# Patient Record
Sex: Female | Born: 1965 | Race: White | Hispanic: No | Marital: Married | Smoking: Current every day smoker
Health system: Southern US, Community
[De-identification: ages and names within clinical notes are randomized; demographics above are authoritative.]

## PROBLEM LIST (undated history)

## (undated) DIAGNOSIS — I2699 Other pulmonary embolism without acute cor pulmonale: Secondary | ICD-10-CM

## (undated) DIAGNOSIS — I10 Essential (primary) hypertension: Secondary | ICD-10-CM

## (undated) DIAGNOSIS — F329 Major depressive disorder, single episode, unspecified: Secondary | ICD-10-CM

## (undated) DIAGNOSIS — F32A Depression, unspecified: Secondary | ICD-10-CM

## (undated) DIAGNOSIS — F419 Anxiety disorder, unspecified: Secondary | ICD-10-CM

## (undated) DIAGNOSIS — O2230 Deep phlebothrombosis in pregnancy, unspecified trimester: Secondary | ICD-10-CM

## (undated) DIAGNOSIS — D3500 Benign neoplasm of unspecified adrenal gland: Secondary | ICD-10-CM

## (undated) DIAGNOSIS — E119 Type 2 diabetes mellitus without complications: Secondary | ICD-10-CM

## (undated) DIAGNOSIS — K219 Gastro-esophageal reflux disease without esophagitis: Secondary | ICD-10-CM

## (undated) DIAGNOSIS — I639 Cerebral infarction, unspecified: Secondary | ICD-10-CM

## (undated) HISTORY — PX: NO PAST SURGERIES: SHX2092

---

## 1997-06-28 ENCOUNTER — Emergency Department (HOSPITAL_COMMUNITY): Admission: EM | Admit: 1997-06-28 | Discharge: 1997-06-28 | Payer: Self-pay

## 1997-09-22 ENCOUNTER — Emergency Department (HOSPITAL_COMMUNITY): Admission: EM | Admit: 1997-09-22 | Discharge: 1997-09-22 | Payer: Self-pay | Admitting: Emergency Medicine

## 1997-11-07 ENCOUNTER — Emergency Department (HOSPITAL_COMMUNITY): Admission: EM | Admit: 1997-11-07 | Discharge: 1997-11-07 | Payer: Self-pay

## 1998-02-04 ENCOUNTER — Emergency Department (HOSPITAL_COMMUNITY): Admission: EM | Admit: 1998-02-04 | Discharge: 1998-02-04 | Payer: Self-pay | Admitting: Emergency Medicine

## 1998-11-12 ENCOUNTER — Emergency Department (HOSPITAL_COMMUNITY): Admission: EM | Admit: 1998-11-12 | Discharge: 1998-11-12 | Payer: Self-pay | Admitting: *Deleted

## 1998-12-10 ENCOUNTER — Emergency Department (HOSPITAL_COMMUNITY): Admission: EM | Admit: 1998-12-10 | Discharge: 1998-12-10 | Payer: Self-pay | Admitting: Emergency Medicine

## 1999-06-15 ENCOUNTER — Emergency Department (HOSPITAL_COMMUNITY): Admission: EM | Admit: 1999-06-15 | Discharge: 1999-06-15 | Payer: Self-pay | Admitting: Emergency Medicine

## 1999-12-07 ENCOUNTER — Emergency Department (HOSPITAL_COMMUNITY): Admission: EM | Admit: 1999-12-07 | Discharge: 1999-12-07 | Payer: Self-pay | Admitting: Emergency Medicine

## 2004-03-17 ENCOUNTER — Ambulatory Visit (HOSPITAL_COMMUNITY): Admission: RE | Admit: 2004-03-17 | Discharge: 2004-03-17 | Payer: Self-pay | Admitting: Emergency Medicine

## 2004-03-31 ENCOUNTER — Emergency Department (HOSPITAL_COMMUNITY): Admission: EM | Admit: 2004-03-31 | Discharge: 2004-04-01 | Payer: Self-pay | Admitting: Emergency Medicine

## 2004-08-14 ENCOUNTER — Emergency Department (HOSPITAL_COMMUNITY): Admission: EM | Admit: 2004-08-14 | Discharge: 2004-08-14 | Payer: Self-pay | Admitting: Emergency Medicine

## 2005-08-03 ENCOUNTER — Inpatient Hospital Stay (HOSPITAL_COMMUNITY): Admission: EM | Admit: 2005-08-03 | Discharge: 2005-08-04 | Payer: Self-pay | Admitting: Emergency Medicine

## 2005-08-06 ENCOUNTER — Emergency Department (HOSPITAL_COMMUNITY): Admission: EM | Admit: 2005-08-06 | Discharge: 2005-08-06 | Payer: Self-pay | Admitting: Emergency Medicine

## 2005-08-13 ENCOUNTER — Emergency Department (HOSPITAL_COMMUNITY): Admission: EM | Admit: 2005-08-13 | Discharge: 2005-08-13 | Payer: Self-pay | Admitting: Emergency Medicine

## 2005-08-16 ENCOUNTER — Encounter: Admission: RE | Admit: 2005-08-16 | Discharge: 2005-08-16 | Payer: Self-pay | Admitting: Internal Medicine

## 2005-08-30 ENCOUNTER — Emergency Department (HOSPITAL_COMMUNITY): Admission: EM | Admit: 2005-08-30 | Discharge: 2005-08-30 | Payer: Self-pay | Admitting: Emergency Medicine

## 2005-09-03 ENCOUNTER — Emergency Department (HOSPITAL_COMMUNITY): Admission: EM | Admit: 2005-09-03 | Discharge: 2005-09-03 | Payer: Self-pay

## 2005-09-08 ENCOUNTER — Emergency Department (HOSPITAL_COMMUNITY): Admission: EM | Admit: 2005-09-08 | Discharge: 2005-09-08 | Payer: Self-pay | Admitting: Emergency Medicine

## 2005-09-13 ENCOUNTER — Emergency Department (HOSPITAL_COMMUNITY): Admission: EM | Admit: 2005-09-13 | Discharge: 2005-09-13 | Payer: Self-pay | Admitting: Emergency Medicine

## 2005-09-14 ENCOUNTER — Ambulatory Visit (HOSPITAL_COMMUNITY): Admission: RE | Admit: 2005-09-14 | Discharge: 2005-09-14 | Payer: Self-pay | Admitting: Family Medicine

## 2005-09-25 ENCOUNTER — Emergency Department (HOSPITAL_COMMUNITY): Admission: EM | Admit: 2005-09-25 | Discharge: 2005-09-26 | Payer: Self-pay | Admitting: Emergency Medicine

## 2005-10-01 ENCOUNTER — Emergency Department (HOSPITAL_COMMUNITY): Admission: EM | Admit: 2005-10-01 | Discharge: 2005-10-01 | Payer: Self-pay | Admitting: Emergency Medicine

## 2005-10-04 ENCOUNTER — Emergency Department (HOSPITAL_COMMUNITY): Admission: EM | Admit: 2005-10-04 | Discharge: 2005-10-04 | Payer: Self-pay | Admitting: *Deleted

## 2005-10-07 ENCOUNTER — Inpatient Hospital Stay (HOSPITAL_COMMUNITY): Admission: AD | Admit: 2005-10-07 | Discharge: 2005-10-08 | Payer: Self-pay | Admitting: Internal Medicine

## 2005-10-18 ENCOUNTER — Emergency Department (HOSPITAL_COMMUNITY): Admission: EM | Admit: 2005-10-18 | Discharge: 2005-10-18 | Payer: Self-pay | Admitting: Emergency Medicine

## 2005-10-23 ENCOUNTER — Emergency Department (HOSPITAL_COMMUNITY): Admission: EM | Admit: 2005-10-23 | Discharge: 2005-10-23 | Payer: Self-pay | Admitting: Emergency Medicine

## 2005-10-25 ENCOUNTER — Emergency Department (HOSPITAL_COMMUNITY): Admission: EM | Admit: 2005-10-25 | Discharge: 2005-10-25 | Payer: Self-pay | Admitting: Emergency Medicine

## 2005-10-28 ENCOUNTER — Emergency Department (HOSPITAL_COMMUNITY): Admission: EM | Admit: 2005-10-28 | Discharge: 2005-10-28 | Payer: Self-pay | Admitting: Emergency Medicine

## 2005-10-30 ENCOUNTER — Emergency Department (HOSPITAL_COMMUNITY): Admission: EM | Admit: 2005-10-30 | Discharge: 2005-10-31 | Payer: Self-pay | Admitting: Emergency Medicine

## 2005-11-08 ENCOUNTER — Emergency Department (HOSPITAL_COMMUNITY): Admission: EM | Admit: 2005-11-08 | Discharge: 2005-11-08 | Payer: Self-pay | Admitting: Emergency Medicine

## 2005-11-23 ENCOUNTER — Emergency Department (HOSPITAL_COMMUNITY): Admission: EM | Admit: 2005-11-23 | Discharge: 2005-11-23 | Payer: Self-pay | Admitting: Emergency Medicine

## 2005-11-25 ENCOUNTER — Emergency Department (HOSPITAL_COMMUNITY): Admission: EM | Admit: 2005-11-25 | Discharge: 2005-11-25 | Payer: Self-pay | Admitting: Emergency Medicine

## 2005-12-06 ENCOUNTER — Emergency Department (HOSPITAL_COMMUNITY): Admission: EM | Admit: 2005-12-06 | Discharge: 2005-12-06 | Payer: Self-pay | Admitting: Emergency Medicine

## 2005-12-11 ENCOUNTER — Emergency Department (HOSPITAL_COMMUNITY): Admission: EM | Admit: 2005-12-11 | Discharge: 2005-12-11 | Payer: Self-pay | Admitting: Emergency Medicine

## 2006-01-17 ENCOUNTER — Emergency Department (HOSPITAL_COMMUNITY): Admission: EM | Admit: 2006-01-17 | Discharge: 2006-01-17 | Payer: Self-pay | Admitting: Emergency Medicine

## 2006-01-31 ENCOUNTER — Emergency Department (HOSPITAL_COMMUNITY): Admission: EM | Admit: 2006-01-31 | Discharge: 2006-01-31 | Payer: Self-pay | Admitting: Emergency Medicine

## 2006-02-16 ENCOUNTER — Emergency Department (HOSPITAL_COMMUNITY): Admission: EM | Admit: 2006-02-16 | Discharge: 2006-02-16 | Payer: Self-pay | Admitting: Emergency Medicine

## 2006-02-20 ENCOUNTER — Emergency Department (HOSPITAL_COMMUNITY): Admission: EM | Admit: 2006-02-20 | Discharge: 2006-02-20 | Payer: Self-pay | Admitting: Emergency Medicine

## 2006-02-26 ENCOUNTER — Ambulatory Visit: Payer: Self-pay | Admitting: Gastroenterology

## 2006-02-26 ENCOUNTER — Emergency Department (HOSPITAL_COMMUNITY): Admission: EM | Admit: 2006-02-26 | Discharge: 2006-02-26 | Payer: Self-pay | Admitting: Emergency Medicine

## 2006-03-08 ENCOUNTER — Emergency Department (HOSPITAL_COMMUNITY): Admission: EM | Admit: 2006-03-08 | Discharge: 2006-03-09 | Payer: Self-pay | Admitting: Emergency Medicine

## 2006-03-18 ENCOUNTER — Emergency Department (HOSPITAL_COMMUNITY): Admission: EM | Admit: 2006-03-18 | Discharge: 2006-03-18 | Payer: Self-pay | Admitting: Emergency Medicine

## 2006-03-30 ENCOUNTER — Emergency Department (HOSPITAL_COMMUNITY): Admission: EM | Admit: 2006-03-30 | Discharge: 2006-03-30 | Payer: Self-pay | Admitting: Emergency Medicine

## 2006-04-01 ENCOUNTER — Ambulatory Visit: Payer: Self-pay | Admitting: Internal Medicine

## 2006-04-06 ENCOUNTER — Emergency Department (HOSPITAL_COMMUNITY): Admission: EM | Admit: 2006-04-06 | Discharge: 2006-04-06 | Payer: Self-pay | Admitting: Emergency Medicine

## 2006-04-09 ENCOUNTER — Ambulatory Visit: Payer: Self-pay | Admitting: Gastroenterology

## 2006-04-09 ENCOUNTER — Ambulatory Visit: Payer: Self-pay | Admitting: Internal Medicine

## 2006-04-09 LAB — CONVERTED CEMR LAB
Basophils Relative: 0.8 % (ref 0.0–1.0)
HCT: 41.5 % (ref 36.0–46.0)
HDL: 34 mg/dL — ABNORMAL LOW (ref >39.0)
Hemoglobin: 14.6 g/dL (ref 12.0–15.0)
Leukocytes, UA: NEGATIVE
Monocytes Absolute: 0.6 10*3/uL (ref 0.2–0.7)
Neutrophils Relative %: 68.3 % (ref 43.0–77.0)
RDW: 12.9 % (ref 11.5–14.6)
Specific Gravity, Urine: >=1.03 (ref 1.000–1.03)
Urobilinogen, UA: 0.2 (ref 0.0–1.0)
VLDL: 30 mg/dL (ref 0–40)
pH: 5.5 (ref 5.0–8.0)

## 2006-04-10 ENCOUNTER — Emergency Department (HOSPITAL_COMMUNITY): Admission: EM | Admit: 2006-04-10 | Discharge: 2006-04-10 | Payer: Self-pay | Admitting: Emergency Medicine

## 2006-04-18 ENCOUNTER — Emergency Department (HOSPITAL_COMMUNITY): Admission: EM | Admit: 2006-04-18 | Discharge: 2006-04-18 | Payer: Self-pay | Admitting: Emergency Medicine

## 2006-04-19 ENCOUNTER — Ambulatory Visit: Payer: Self-pay | Admitting: Internal Medicine

## 2006-04-24 ENCOUNTER — Ambulatory Visit: Payer: Self-pay | Admitting: Gastroenterology

## 2006-04-24 ENCOUNTER — Encounter (INDEPENDENT_AMBULATORY_CARE_PROVIDER_SITE_OTHER): Payer: Self-pay | Admitting: Specialist

## 2006-04-26 ENCOUNTER — Emergency Department (HOSPITAL_COMMUNITY): Admission: EM | Admit: 2006-04-26 | Discharge: 2006-04-26 | Payer: Self-pay | Admitting: Emergency Medicine

## 2006-05-02 ENCOUNTER — Emergency Department (HOSPITAL_COMMUNITY): Admission: EM | Admit: 2006-05-02 | Discharge: 2006-05-02 | Payer: Self-pay | Admitting: Emergency Medicine

## 2006-05-11 ENCOUNTER — Emergency Department (HOSPITAL_COMMUNITY): Admission: EM | Admit: 2006-05-11 | Discharge: 2006-05-11 | Payer: Self-pay | Admitting: Emergency Medicine

## 2006-05-11 ENCOUNTER — Ambulatory Visit: Payer: Self-pay | Admitting: Family Medicine

## 2006-05-14 ENCOUNTER — Emergency Department (HOSPITAL_COMMUNITY): Admission: EM | Admit: 2006-05-14 | Discharge: 2006-05-14 | Payer: Self-pay | Admitting: Emergency Medicine

## 2006-05-16 ENCOUNTER — Emergency Department (HOSPITAL_COMMUNITY): Admission: EM | Admit: 2006-05-16 | Discharge: 2006-05-16 | Payer: Self-pay | Admitting: Emergency Medicine

## 2006-05-22 ENCOUNTER — Emergency Department (HOSPITAL_COMMUNITY): Admission: EM | Admit: 2006-05-22 | Discharge: 2006-05-22 | Payer: Self-pay | Admitting: Emergency Medicine

## 2006-05-29 ENCOUNTER — Ambulatory Visit: Payer: Self-pay | Admitting: Internal Medicine

## 2006-05-29 ENCOUNTER — Ambulatory Visit: Payer: Self-pay | Admitting: Cardiology

## 2006-05-29 LAB — CONVERTED CEMR LAB
Alkaline Phosphatase: 79 units/L (ref 39–117)
Amylase: 108 units/L (ref 27–131)
BUN: 8 mg/dL (ref 6–23)
CO2: 25 meq/L (ref 19–32)
GFR calc Af Amer: 63 mL/min
Ketones, ur: NEGATIVE mg/dL
Potassium: 3.7 meq/L (ref 3.5–5.1)
RBC / HPF: NONE SEEN
Sed Rate: 9 mm/hr (ref 0–25)
Sodium: 140 meq/L (ref 135–145)
Specific Gravity, Urine: 1.02 (ref 1.000–1.03)
Total CHOL/HDL Ratio: 8.6
Total Protein: 7.2 g/dL (ref 6.0–8.3)
Triglycerides: 244 mg/dL (ref 0–149)
Urine Glucose: NEGATIVE mg/dL
Urobilinogen, UA: 0.2 (ref 0.0–1.0)
hCG, Beta Chain, Quant, S: <0.5 milliintl units/mL

## 2006-06-02 ENCOUNTER — Emergency Department (HOSPITAL_COMMUNITY): Admission: EM | Admit: 2006-06-02 | Discharge: 2006-06-03 | Payer: Self-pay | Admitting: Emergency Medicine

## 2006-06-10 ENCOUNTER — Emergency Department (HOSPITAL_COMMUNITY): Admission: EM | Admit: 2006-06-10 | Discharge: 2006-06-10 | Payer: Self-pay | Admitting: Emergency Medicine

## 2006-06-20 ENCOUNTER — Emergency Department (HOSPITAL_COMMUNITY): Admission: EM | Admit: 2006-06-20 | Discharge: 2006-06-20 | Payer: Self-pay | Admitting: Emergency Medicine

## 2006-07-01 ENCOUNTER — Emergency Department (HOSPITAL_COMMUNITY): Admission: EM | Admit: 2006-07-01 | Discharge: 2006-07-01 | Payer: Self-pay | Admitting: Emergency Medicine

## 2006-07-11 ENCOUNTER — Emergency Department (HOSPITAL_COMMUNITY): Admission: EM | Admit: 2006-07-11 | Discharge: 2006-07-11 | Payer: Self-pay | Admitting: Emergency Medicine

## 2006-07-20 ENCOUNTER — Emergency Department (HOSPITAL_COMMUNITY): Admission: EM | Admit: 2006-07-20 | Discharge: 2006-07-20 | Payer: Self-pay | Admitting: Emergency Medicine

## 2006-08-15 ENCOUNTER — Emergency Department (HOSPITAL_COMMUNITY): Admission: EM | Admit: 2006-08-15 | Discharge: 2006-08-15 | Payer: Self-pay | Admitting: *Deleted

## 2006-08-18 ENCOUNTER — Emergency Department (HOSPITAL_COMMUNITY): Admission: EM | Admit: 2006-08-18 | Discharge: 2006-08-18 | Payer: Self-pay | Admitting: Emergency Medicine

## 2006-09-29 ENCOUNTER — Emergency Department (HOSPITAL_COMMUNITY): Admission: EM | Admit: 2006-09-29 | Discharge: 2006-09-29 | Payer: Self-pay | Admitting: Emergency Medicine

## 2006-10-24 ENCOUNTER — Ambulatory Visit: Payer: Self-pay | Admitting: Family Medicine

## 2006-10-24 ENCOUNTER — Observation Stay (HOSPITAL_COMMUNITY): Admission: EM | Admit: 2006-10-24 | Discharge: 2006-10-25 | Payer: Self-pay | Admitting: Family Medicine

## 2006-10-27 ENCOUNTER — Ambulatory Visit (HOSPITAL_COMMUNITY): Admission: RE | Admit: 2006-10-27 | Discharge: 2006-10-27 | Payer: Self-pay | Admitting: Family Medicine

## 2006-12-03 ENCOUNTER — Emergency Department (HOSPITAL_COMMUNITY): Admission: EM | Admit: 2006-12-03 | Discharge: 2006-12-03 | Payer: Self-pay | Admitting: Emergency Medicine

## 2007-01-03 ENCOUNTER — Emergency Department (HOSPITAL_COMMUNITY): Admission: EM | Admit: 2007-01-03 | Discharge: 2007-01-03 | Payer: Self-pay | Admitting: Emergency Medicine

## 2007-02-01 ENCOUNTER — Emergency Department (HOSPITAL_COMMUNITY): Admission: EM | Admit: 2007-02-01 | Discharge: 2007-02-01 | Payer: Self-pay | Admitting: Emergency Medicine

## 2007-02-14 ENCOUNTER — Emergency Department (HOSPITAL_COMMUNITY): Admission: EM | Admit: 2007-02-14 | Discharge: 2007-02-15 | Payer: Self-pay | Admitting: Emergency Medicine

## 2007-03-06 ENCOUNTER — Emergency Department (HOSPITAL_COMMUNITY): Admission: EM | Admit: 2007-03-06 | Discharge: 2007-03-06 | Payer: Self-pay | Admitting: Emergency Medicine

## 2007-03-14 ENCOUNTER — Emergency Department (HOSPITAL_COMMUNITY): Admission: EM | Admit: 2007-03-14 | Discharge: 2007-03-14 | Payer: Self-pay | Admitting: Emergency Medicine

## 2007-03-17 ENCOUNTER — Ambulatory Visit: Payer: Self-pay | Admitting: Gastroenterology

## 2007-04-08 ENCOUNTER — Emergency Department (HOSPITAL_COMMUNITY): Admission: EM | Admit: 2007-04-08 | Discharge: 2007-04-08 | Payer: Self-pay | Admitting: Emergency Medicine

## 2007-05-12 ENCOUNTER — Emergency Department (HOSPITAL_COMMUNITY): Admission: EM | Admit: 2007-05-12 | Discharge: 2007-05-13 | Payer: Self-pay | Admitting: Emergency Medicine

## 2007-07-08 ENCOUNTER — Emergency Department (HOSPITAL_COMMUNITY): Admission: EM | Admit: 2007-07-08 | Discharge: 2007-07-08 | Payer: Self-pay | Admitting: Emergency Medicine

## 2007-09-17 ENCOUNTER — Emergency Department (HOSPITAL_COMMUNITY): Admission: EM | Admit: 2007-09-17 | Discharge: 2007-09-17 | Payer: Self-pay | Admitting: Emergency Medicine

## 2007-10-27 ENCOUNTER — Emergency Department (HOSPITAL_COMMUNITY): Admission: EM | Admit: 2007-10-27 | Discharge: 2007-10-27 | Payer: Self-pay | Admitting: Emergency Medicine

## 2007-12-11 ENCOUNTER — Emergency Department (HOSPITAL_COMMUNITY): Admission: EM | Admit: 2007-12-11 | Discharge: 2007-12-11 | Payer: Self-pay | Admitting: Emergency Medicine

## 2008-02-11 ENCOUNTER — Emergency Department (HOSPITAL_COMMUNITY): Admission: EM | Admit: 2008-02-11 | Discharge: 2008-02-11 | Payer: Self-pay | Admitting: Emergency Medicine

## 2008-02-23 ENCOUNTER — Inpatient Hospital Stay (HOSPITAL_COMMUNITY): Admission: EM | Admit: 2008-02-23 | Discharge: 2008-02-27 | Payer: Self-pay | Admitting: Emergency Medicine

## 2008-02-24 ENCOUNTER — Ambulatory Visit: Payer: Self-pay | Admitting: Vascular Surgery

## 2008-02-24 ENCOUNTER — Encounter (INDEPENDENT_AMBULATORY_CARE_PROVIDER_SITE_OTHER): Payer: Self-pay | Admitting: Internal Medicine

## 2008-03-08 ENCOUNTER — Emergency Department (HOSPITAL_COMMUNITY): Admission: EM | Admit: 2008-03-08 | Discharge: 2008-03-08 | Payer: Self-pay | Admitting: Emergency Medicine

## 2008-05-07 ENCOUNTER — Encounter: Admission: RE | Admit: 2008-05-07 | Discharge: 2008-05-07 | Payer: Self-pay | Admitting: Internal Medicine

## 2008-05-17 ENCOUNTER — Emergency Department (HOSPITAL_COMMUNITY): Admission: EM | Admit: 2008-05-17 | Discharge: 2008-05-18 | Payer: Self-pay | Admitting: Emergency Medicine

## 2008-06-15 ENCOUNTER — Emergency Department (HOSPITAL_COMMUNITY): Admission: EM | Admit: 2008-06-15 | Discharge: 2008-06-15 | Payer: Self-pay | Admitting: Emergency Medicine

## 2008-07-30 ENCOUNTER — Encounter: Admission: RE | Admit: 2008-07-30 | Discharge: 2008-07-30 | Payer: Self-pay | Admitting: Internal Medicine

## 2008-08-19 ENCOUNTER — Emergency Department (HOSPITAL_COMMUNITY): Admission: EM | Admit: 2008-08-19 | Discharge: 2008-08-20 | Payer: Self-pay | Admitting: Emergency Medicine

## 2008-09-17 ENCOUNTER — Inpatient Hospital Stay (HOSPITAL_COMMUNITY): Admission: EM | Admit: 2008-09-17 | Discharge: 2008-09-18 | Payer: Self-pay | Admitting: Emergency Medicine

## 2009-02-26 ENCOUNTER — Emergency Department (HOSPITAL_COMMUNITY): Admission: EM | Admit: 2009-02-26 | Discharge: 2009-02-26 | Payer: Self-pay | Admitting: Emergency Medicine

## 2009-06-06 ENCOUNTER — Emergency Department (HOSPITAL_COMMUNITY): Admission: EM | Admit: 2009-06-06 | Discharge: 2009-06-06 | Payer: Self-pay | Admitting: Emergency Medicine

## 2009-08-06 ENCOUNTER — Emergency Department (HOSPITAL_COMMUNITY): Admission: EM | Admit: 2009-08-06 | Discharge: 2009-08-06 | Payer: Self-pay | Admitting: Emergency Medicine

## 2010-01-29 ENCOUNTER — Encounter: Payer: Self-pay | Admitting: Gastroenterology

## 2010-03-25 LAB — URINALYSIS, ROUTINE W REFLEX MICROSCOPIC
Bilirubin Urine: NEGATIVE
Ketones, ur: NEGATIVE mg/dL
Nitrite: NEGATIVE
Protein, ur: NEGATIVE mg/dL
Specific Gravity, Urine: 1.029 (ref 1.005–1.030)
Urobilinogen, UA: 0.2 mg/dL (ref 0.0–1.0)

## 2010-03-25 LAB — DIFFERENTIAL
Eosinophils Relative: 2 % (ref 0–5)
Lymphocytes Relative: 37 % (ref 12–46)
Lymphs Abs: 3.9 10*3/uL (ref 0.7–4.0)

## 2010-03-25 LAB — COMPREHENSIVE METABOLIC PANEL
AST: 17 U/L (ref 0–37)
CO2: 25 mEq/L (ref 19–32)
Calcium: 9.1 mg/dL (ref 8.4–10.5)
Creatinine, Ser: 1.19 mg/dL (ref 0.4–1.2)
GFR calc Af Amer: 59 mL/min — ABNORMAL LOW (ref >60)
GFR calc non Af Amer: 49 mL/min — ABNORMAL LOW (ref >60)

## 2010-03-25 LAB — URINE CULTURE: Colony Count: 2000

## 2010-03-25 LAB — CBC
Hemoglobin: 16.3 g/dL — ABNORMAL HIGH (ref 12.0–15.0)
MCH: 33.2 pg (ref 26.0–34.0)
MCHC: 34.8 g/dL (ref 30.0–36.0)
Platelets: 229 10*3/uL (ref 150–400)

## 2010-03-25 LAB — LIPASE, BLOOD: Lipase: 27 U/L (ref 11–59)

## 2010-03-27 LAB — CBC
HCT: 46.5 % — ABNORMAL HIGH (ref 36.0–46.0)
Hemoglobin: 15.9 g/dL — ABNORMAL HIGH (ref 12.0–15.0)
RBC: 4.82 MIL/uL (ref 3.87–5.11)
RDW: 13.8 % (ref 11.5–15.5)
WBC: 19 10*3/uL — ABNORMAL HIGH (ref 4.0–10.5)

## 2010-03-27 LAB — DIFFERENTIAL
Eosinophils Relative: 1 % (ref 0–5)
Lymphocytes Relative: 12 % (ref 12–46)
Lymphs Abs: 2.2 10*3/uL (ref 0.7–4.0)
Monocytes Absolute: 0.9 10*3/uL (ref 0.1–1.0)
Monocytes Relative: 5 % (ref 3–12)

## 2010-03-27 LAB — POCT I-STAT, CHEM 8
BUN: 12 mg/dL (ref 6–23)
Calcium, Ion: 1.04 mmol/L — ABNORMAL LOW (ref 1.12–1.32)
Creatinine, Ser: 1.1 mg/dL (ref 0.4–1.2)
Glucose, Bld: 136 mg/dL — ABNORMAL HIGH (ref 70–99)
TCO2: 19 mmol/L (ref 0–100)

## 2010-03-27 LAB — D-DIMER, QUANTITATIVE: D-Dimer, Quant: 1.53 ug/mL-FEU — ABNORMAL HIGH (ref 0.00–0.48)

## 2010-03-27 LAB — POCT CARDIAC MARKERS: Troponin i, poc: <0.05 ng/mL (ref 0.00–0.09)

## 2010-03-29 LAB — BASIC METABOLIC PANEL
BUN: 12 mg/dL (ref 6–23)
Chloride: 108 mEq/L (ref 96–112)
Creatinine, Ser: 1.05 mg/dL (ref 0.4–1.2)
Glucose, Bld: 86 mg/dL (ref 70–99)

## 2010-03-29 LAB — URINE MICROSCOPIC-ADD ON

## 2010-03-29 LAB — DIFFERENTIAL
Basophils Absolute: 0 10*3/uL (ref 0.0–0.1)
Basophils Relative: 0 % (ref 0–1)
Eosinophils Absolute: 0.2 10*3/uL (ref 0.0–0.7)
Eosinophils Relative: 1 % (ref 0–5)
Lymphs Abs: 3.4 10*3/uL (ref 0.7–4.0)
Neutrophils Relative %: 67 % (ref 43–77)

## 2010-03-29 LAB — CBC
HCT: 43.5 % (ref 36.0–46.0)
MCV: 96.9 fL (ref 78.0–100.0)
Platelets: 223 10*3/uL (ref 150–400)
RDW: 15.4 % (ref 11.5–15.5)
WBC: 13.5 10*3/uL — ABNORMAL HIGH (ref 4.0–10.5)

## 2010-03-29 LAB — URINALYSIS, ROUTINE W REFLEX MICROSCOPIC
Nitrite: NEGATIVE
Specific Gravity, Urine: 1.024 (ref 1.005–1.030)
Urobilinogen, UA: 0.2 mg/dL (ref 0.0–1.0)
pH: 5.5 (ref 5.0–8.0)

## 2010-04-11 ENCOUNTER — Emergency Department (HOSPITAL_COMMUNITY): Payer: Self-pay

## 2010-04-11 ENCOUNTER — Encounter (HOSPITAL_COMMUNITY): Payer: Self-pay | Admitting: Radiology

## 2010-04-11 ENCOUNTER — Emergency Department (HOSPITAL_COMMUNITY)
Admission: EM | Admit: 2010-04-11 | Discharge: 2010-04-11 | Disposition: A | Discharge status: Home / Self Care | Payer: Self-pay | Attending: Emergency Medicine | Admitting: Emergency Medicine

## 2010-04-11 DIAGNOSIS — Z79899 Other long term (current) drug therapy: Secondary | ICD-10-CM | POA: Insufficient documentation

## 2010-04-11 DIAGNOSIS — R109 Unspecified abdominal pain: Secondary | ICD-10-CM | POA: Insufficient documentation

## 2010-04-11 DIAGNOSIS — R000 Tachycardia, unspecified: Secondary | ICD-10-CM | POA: Insufficient documentation

## 2010-04-11 DIAGNOSIS — K219 Gastro-esophageal reflux disease without esophagitis: Secondary | ICD-10-CM | POA: Insufficient documentation

## 2010-04-11 DIAGNOSIS — R059 Cough, unspecified: Secondary | ICD-10-CM | POA: Insufficient documentation

## 2010-04-11 DIAGNOSIS — Z86718 Personal history of other venous thrombosis and embolism: Secondary | ICD-10-CM | POA: Insufficient documentation

## 2010-04-11 DIAGNOSIS — R110 Nausea: Secondary | ICD-10-CM | POA: Insufficient documentation

## 2010-04-11 DIAGNOSIS — N390 Urinary tract infection, site not specified: Secondary | ICD-10-CM | POA: Insufficient documentation

## 2010-04-11 DIAGNOSIS — R197 Diarrhea, unspecified: Secondary | ICD-10-CM | POA: Insufficient documentation

## 2010-04-11 DIAGNOSIS — F411 Generalized anxiety disorder: Secondary | ICD-10-CM | POA: Insufficient documentation

## 2010-04-11 DIAGNOSIS — R05 Cough: Secondary | ICD-10-CM | POA: Insufficient documentation

## 2010-04-11 HISTORY — DX: Other pulmonary embolism without acute cor pulmonale: I26.99

## 2010-04-11 LAB — COMPREHENSIVE METABOLIC PANEL
AST: 16 U/L (ref 0–37)
Albumin: 3.6 g/dL (ref 3.5–5.2)
Alkaline Phosphatase: 60 U/L (ref 39–117)
BUN: 11 mg/dL (ref 6–23)
Chloride: 108 mEq/L (ref 96–112)
GFR calc Af Amer: >60 mL/min (ref >60)
Potassium: 3.7 mEq/L (ref 3.5–5.1)
Sodium: 137 mEq/L (ref 135–145)
Total Bilirubin: 0.3 mg/dL (ref 0.3–1.2)
Total Protein: 6.7 g/dL (ref 6.0–8.3)

## 2010-04-11 LAB — CBC
Hemoglobin: 16 g/dL — ABNORMAL HIGH (ref 12.0–15.0)
MCH: 33.7 pg (ref 26.0–34.0)
Platelets: 255 10*3/uL (ref 150–400)
RBC: 4.75 MIL/uL (ref 3.87–5.11)
WBC: 15.6 10*3/uL — ABNORMAL HIGH (ref 4.0–10.5)

## 2010-04-11 LAB — URINE MICROSCOPIC-ADD ON

## 2010-04-11 LAB — URINALYSIS, ROUTINE W REFLEX MICROSCOPIC
Glucose, UA: 100 mg/dL — AB
Ketones, ur: NEGATIVE mg/dL
Protein, ur: NEGATIVE mg/dL
pH: 5 (ref 5.0–8.0)

## 2010-04-11 LAB — DIFFERENTIAL
Basophils Absolute: 0 10*3/uL (ref 0.0–0.1)
Basophils Relative: 0 % (ref 0–1)
Eosinophils Absolute: 0.1 10*3/uL (ref 0.0–0.7)
Monocytes Relative: 5 % (ref 3–12)
Neutro Abs: 11.7 10*3/uL — ABNORMAL HIGH (ref 1.7–7.7)
Neutrophils Relative %: 75 % (ref 43–77)

## 2010-04-11 LAB — POCT PREGNANCY, URINE: Preg Test, Ur: NEGATIVE

## 2010-04-11 MED ORDER — IOHEXOL 350 MG/ML SOLN
100.0000 mL | Freq: Once | INTRAVENOUS | Status: AC | PRN
  Administered 2010-04-11: 100 mL via INTRAVENOUS

## 2010-04-12 LAB — URINE CULTURE

## 2010-04-14 LAB — POCT I-STAT, CHEM 8
Creatinine, Ser: 0.9 mg/dL (ref 0.4–1.2)
Glucose, Bld: 107 mg/dL — ABNORMAL HIGH (ref 70–99)
Hemoglobin: 18 g/dL — ABNORMAL HIGH (ref 12.0–15.0)
Sodium: 137 mEq/L (ref 135–145)
TCO2: 25 mmol/L (ref 0–100)

## 2010-04-14 LAB — BASIC METABOLIC PANEL
CO2: 24 mEq/L (ref 19–32)
Calcium: 8.5 mg/dL (ref 8.4–10.5)
Chloride: 108 mEq/L (ref 96–112)
GFR calc Af Amer: >60 mL/min (ref >60)
Sodium: 138 mEq/L (ref 135–145)

## 2010-04-14 LAB — CBC
Hemoglobin: 14.4 g/dL (ref 12.0–15.0)
MCHC: 33.7 g/dL (ref 30.0–36.0)
MCHC: 33.7 g/dL (ref 30.0–36.0)
MCV: 95.9 fL (ref 78.0–100.0)
RBC: 4.45 MIL/uL (ref 3.87–5.11)
RBC: 5.11 MIL/uL (ref 3.87–5.11)

## 2010-04-14 LAB — POCT CARDIAC MARKERS
CKMB, poc: <1 ng/mL — ABNORMAL LOW (ref 1.0–8.0)
Myoglobin, poc: 64.3 ng/mL (ref 12–200)

## 2010-04-14 LAB — DIFFERENTIAL
Basophils Absolute: 0 10*3/uL (ref 0.0–0.1)
Basophils Relative: 0 % (ref 0–1)
Monocytes Relative: 4 % (ref 3–12)
Neutro Abs: 13.6 10*3/uL — ABNORMAL HIGH (ref 1.7–7.7)
Neutrophils Relative %: 82 % — ABNORMAL HIGH (ref 43–77)

## 2010-04-14 LAB — CARDIAC PANEL(CRET KIN+CKTOT+MB+TROPI): Troponin I: 0.02 ng/mL (ref 0.00–0.06)

## 2010-04-14 LAB — URINALYSIS, ROUTINE W REFLEX MICROSCOPIC
Bilirubin Urine: NEGATIVE
Glucose, UA: NEGATIVE mg/dL
Hgb urine dipstick: NEGATIVE
Ketones, ur: NEGATIVE mg/dL
Protein, ur: NEGATIVE mg/dL

## 2010-04-14 LAB — RAPID URINE DRUG SCREEN, HOSP PERFORMED
Amphetamines: NOT DETECTED
Barbiturates: NOT DETECTED
Benzodiazepines: NOT DETECTED
Tetrahydrocannabinol: NOT DETECTED

## 2010-04-14 LAB — LIPID PANEL
HDL: 31 mg/dL — ABNORMAL LOW (ref >39)
LDL Cholesterol: 119 mg/dL — ABNORMAL HIGH (ref 0–99)
Triglycerides: 149 mg/dL (ref <150)

## 2010-04-14 LAB — PROTIME-INR
INR: 2.1 — ABNORMAL HIGH (ref 0.00–1.49)
INR: 2.1 — ABNORMAL HIGH (ref 0.00–1.49)
Prothrombin Time: 23.5 seconds — ABNORMAL HIGH (ref 11.6–15.2)

## 2010-04-14 LAB — CK TOTAL AND CKMB (NOT AT ARMC)
Relative Index: INVALID (ref 0.0–2.5)
Total CK: 60 U/L (ref 7–177)

## 2010-04-15 LAB — COMPREHENSIVE METABOLIC PANEL
ALT: 21 U/L (ref 0–35)
Albumin: 3.5 g/dL (ref 3.5–5.2)
Alkaline Phosphatase: 54 U/L (ref 39–117)
Calcium: 9.2 mg/dL (ref 8.4–10.5)
GFR calc Af Amer: >60 mL/min (ref >60)
Potassium: 4.1 mEq/L (ref 3.5–5.1)
Sodium: 139 mEq/L (ref 135–145)
Total Protein: 6.4 g/dL (ref 6.0–8.3)

## 2010-04-15 LAB — URINE MICROSCOPIC-ADD ON

## 2010-04-15 LAB — URINALYSIS, ROUTINE W REFLEX MICROSCOPIC
Glucose, UA: NEGATIVE mg/dL
Leukocytes, UA: NEGATIVE
Specific Gravity, Urine: 1.034 — ABNORMAL HIGH (ref 1.005–1.030)
pH: 5.5 (ref 5.0–8.0)

## 2010-04-15 LAB — CBC
Hemoglobin: 15.6 g/dL — ABNORMAL HIGH (ref 12.0–15.0)
MCHC: 33.9 g/dL (ref 30.0–36.0)
Platelets: 252 10*3/uL (ref 150–400)
RDW: 14.8 % (ref 11.5–15.5)

## 2010-04-15 LAB — DIFFERENTIAL
Basophils Relative: 1 % (ref 0–1)
Eosinophils Absolute: 0.2 10*3/uL (ref 0.0–0.7)
Lymphs Abs: 3.3 10*3/uL (ref 0.7–4.0)
Monocytes Absolute: 1 10*3/uL (ref 0.1–1.0)
Monocytes Relative: 7 % (ref 3–12)

## 2010-04-15 LAB — PROTIME-INR
INR: 2.2 — ABNORMAL HIGH (ref 0.00–1.49)
Prothrombin Time: 24.4 seconds — ABNORMAL HIGH (ref 11.6–15.2)

## 2010-04-17 LAB — BASIC METABOLIC PANEL
Calcium: 8.7 mg/dL (ref 8.4–10.5)
GFR calc Af Amer: >60 mL/min (ref >60)
GFR calc non Af Amer: 53 mL/min — ABNORMAL LOW (ref >60)
Glucose, Bld: 128 mg/dL — ABNORMAL HIGH (ref 70–99)
Potassium: 3.9 mEq/L (ref 3.5–5.1)
Sodium: 140 mEq/L (ref 135–145)

## 2010-04-17 LAB — PROTIME-INR: Prothrombin Time: 30.3 seconds — ABNORMAL HIGH (ref 11.6–15.2)

## 2010-04-17 LAB — URINALYSIS, ROUTINE W REFLEX MICROSCOPIC
Glucose, UA: NEGATIVE mg/dL
Specific Gravity, Urine: 1.021 (ref 1.005–1.030)
Urobilinogen, UA: 1 mg/dL (ref 0.0–1.0)
pH: 5.5 (ref 5.0–8.0)

## 2010-04-17 LAB — URINE MICROSCOPIC-ADD ON

## 2010-04-18 LAB — CBC
HCT: 46.5 % — ABNORMAL HIGH (ref 36.0–46.0)
Hemoglobin: 16 g/dL — ABNORMAL HIGH (ref 12.0–15.0)
MCV: 94.5 fL (ref 78.0–100.0)
RDW: 14.9 % (ref 11.5–15.5)
WBC: 13.4 10*3/uL — ABNORMAL HIGH (ref 4.0–10.5)

## 2010-04-18 LAB — COMPREHENSIVE METABOLIC PANEL
Alkaline Phosphatase: 75 U/L (ref 39–117)
BUN: 12 mg/dL (ref 6–23)
Chloride: 108 mEq/L (ref 96–112)
Creatinine, Ser: 1.22 mg/dL — ABNORMAL HIGH (ref 0.4–1.2)
Glucose, Bld: 158 mg/dL — ABNORMAL HIGH (ref 70–99)
Potassium: 3.7 mEq/L (ref 3.5–5.1)
Total Bilirubin: 0.5 mg/dL (ref 0.3–1.2)

## 2010-04-18 LAB — PROTIME-INR
INR: 2.4 — ABNORMAL HIGH (ref 0.00–1.49)
Prothrombin Time: 27.3 seconds — ABNORMAL HIGH (ref 11.6–15.2)

## 2010-04-18 LAB — DIFFERENTIAL
Basophils Absolute: 0.1 10*3/uL (ref 0.0–0.1)
Basophils Relative: 1 % (ref 0–1)
Neutro Abs: 7.7 10*3/uL (ref 1.7–7.7)
Neutrophils Relative %: 58 % (ref 43–77)

## 2010-04-18 LAB — URINALYSIS, ROUTINE W REFLEX MICROSCOPIC
Bilirubin Urine: NEGATIVE
Ketones, ur: NEGATIVE mg/dL
Nitrite: NEGATIVE
Protein, ur: NEGATIVE mg/dL
pH: 6 (ref 5.0–8.0)

## 2010-04-18 LAB — PREGNANCY, URINE: Preg Test, Ur: NEGATIVE

## 2010-04-18 LAB — URINE MICROSCOPIC-ADD ON

## 2010-04-18 LAB — LIPASE, BLOOD: Lipase: 30 U/L (ref 11–59)

## 2010-04-20 LAB — COMPREHENSIVE METABOLIC PANEL
ALT: 30 U/L (ref 0–35)
Albumin: 3.4 g/dL — ABNORMAL LOW (ref 3.5–5.2)
Alkaline Phosphatase: 78 U/L (ref 39–117)
Glucose, Bld: 121 mg/dL — ABNORMAL HIGH (ref 70–99)
Potassium: 4.2 mEq/L (ref 3.5–5.1)
Sodium: 136 mEq/L (ref 135–145)
Total Protein: 6.6 g/dL (ref 6.0–8.3)

## 2010-04-20 LAB — DIFFERENTIAL
Basophils Relative: 0 % (ref 0–1)
Eosinophils Absolute: 0.2 10*3/uL (ref 0.0–0.7)
Eosinophils Relative: 1 % (ref 0–5)
Monocytes Absolute: 0.6 10*3/uL (ref 0.1–1.0)
Monocytes Relative: 4 % (ref 3–12)
Neutrophils Relative %: 82 % — ABNORMAL HIGH (ref 43–77)

## 2010-04-20 LAB — URINALYSIS, ROUTINE W REFLEX MICROSCOPIC
Bilirubin Urine: NEGATIVE
Ketones, ur: NEGATIVE mg/dL
Nitrite: NEGATIVE
Protein, ur: NEGATIVE mg/dL
Urobilinogen, UA: 0.2 mg/dL (ref 0.0–1.0)
pH: 6 (ref 5.0–8.0)

## 2010-04-20 LAB — CK TOTAL AND CKMB (NOT AT ARMC): Total CK: 53 U/L (ref 7–177)

## 2010-04-20 LAB — CBC
Hemoglobin: 15.9 g/dL — ABNORMAL HIGH (ref 12.0–15.0)
MCHC: 34.1 g/dL (ref 30.0–36.0)
RBC: 4.95 MIL/uL (ref 3.87–5.11)

## 2010-04-20 LAB — URINE MICROSCOPIC-ADD ON

## 2010-04-25 LAB — CBC
HCT: 44 % (ref 36.0–46.0)
HCT: 40.8 % (ref 36.0–46.0)
HCT: 41.4 % (ref 36.0–46.0)
Hemoglobin: 15 g/dL (ref 12.0–15.0)
Hemoglobin: 13.7 g/dL (ref 12.0–15.0)
MCHC: 33.9 g/dL (ref 30.0–36.0)
MCHC: 34.1 g/dL (ref 30.0–36.0)
MCHC: 33.7 g/dL (ref 30.0–36.0)
MCV: 94.4 fL (ref 78.0–100.0)
MCV: 94.1 fL (ref 78.0–100.0)
MCV: 94.9 fL (ref 78.0–100.0)
Platelets: 244 10*3/uL (ref 150–400)
Platelets: 221 10*3/uL (ref 150–400)
Platelets: 227 10*3/uL (ref 150–400)
Platelets: 231 10*3/uL (ref 150–400)
RBC: 4.66 MIL/uL (ref 3.87–5.11)
RBC: 4.95 MIL/uL (ref 3.87–5.11)
RBC: 4.25 MIL/uL (ref 3.87–5.11)
RBC: 4.3 MIL/uL (ref 3.87–5.11)
RDW: 14.3 % (ref 11.5–15.5)
WBC: 18.2 10*3/uL — ABNORMAL HIGH (ref 4.0–10.5)
WBC: 10.6 10*3/uL — ABNORMAL HIGH (ref 4.0–10.5)
WBC: 10.6 10*3/uL — ABNORMAL HIGH (ref 4.0–10.5)
WBC: 13.1 10*3/uL — ABNORMAL HIGH (ref 4.0–10.5)
WBC: 15.6 10*3/uL — ABNORMAL HIGH (ref 4.0–10.5)

## 2010-04-25 LAB — BASIC METABOLIC PANEL
BUN: 6 mg/dL (ref 6–23)
BUN: 7 mg/dL (ref 6–23)
BUN: 7 mg/dL (ref 6–23)
Calcium: 8 mg/dL — ABNORMAL LOW (ref 8.4–10.5)
Calcium: 8.3 mg/dL — ABNORMAL LOW (ref 8.4–10.5)
Creatinine, Ser: 0.84 mg/dL (ref 0.4–1.2)
Creatinine, Ser: 0.87 mg/dL (ref 0.4–1.2)
Creatinine, Ser: 0.92 mg/dL (ref 0.4–1.2)
GFR calc Af Amer: >60 mL/min (ref >60)
GFR calc Af Amer: >60 mL/min (ref >60)
GFR calc non Af Amer: >60 mL/min (ref >60)
GFR calc non Af Amer: >60 mL/min (ref >60)
GFR calc non Af Amer: >60 mL/min (ref >60)
Potassium: 4.3 mEq/L (ref 3.5–5.1)

## 2010-04-25 LAB — DIFFERENTIAL
Basophils Relative: 0 % (ref 0–1)
Eosinophils Absolute: 0.1 10*3/uL (ref 0.0–0.7)
Eosinophils Absolute: 0.3 10*3/uL (ref 0.0–0.7)
Eosinophils Relative: 0 % (ref 0–5)
Lymphocytes Relative: 15 % (ref 12–46)
Lymphs Abs: 2.7 10*3/uL (ref 0.7–4.0)
Lymphs Abs: 2.9 10*3/uL (ref 0.7–4.0)
Monocytes Absolute: 0.6 10*3/uL (ref 0.1–1.0)
Monocytes Relative: 3 % (ref 3–12)
Neutro Abs: 17.3 10*3/uL — ABNORMAL HIGH (ref 1.7–7.7)
Neutrophils Relative %: 82 % — ABNORMAL HIGH (ref 43–77)

## 2010-04-25 LAB — LACTIC ACID, PLASMA: Lactic Acid, Venous: 1.1 mmol/L (ref 0.5–2.2)

## 2010-04-25 LAB — COMPREHENSIVE METABOLIC PANEL
ALT: 20 U/L (ref 0–35)
ALT: 23 U/L (ref 0–35)
AST: 20 U/L (ref 0–37)
Albumin: 3.9 g/dL (ref 3.5–5.2)
BUN: 10 mg/dL (ref 6–23)
CO2: 20 mEq/L (ref 19–32)
CO2: 21 mEq/L (ref 19–32)
Calcium: 8.6 mg/dL (ref 8.4–10.5)
Calcium: 8.7 mg/dL (ref 8.4–10.5)
Chloride: 104 mEq/L (ref 96–112)
Creatinine, Ser: 1.14 mg/dL (ref 0.4–1.2)
GFR calc Af Amer: >60 mL/min (ref >60)
GFR calc non Af Amer: 52 mL/min — ABNORMAL LOW (ref >60)
GFR calc non Af Amer: 55 mL/min — ABNORMAL LOW (ref >60)
Glucose, Bld: 105 mg/dL — ABNORMAL HIGH (ref 70–99)
Sodium: 135 mEq/L (ref 135–145)
Sodium: 137 mEq/L (ref 135–145)
Total Protein: 6.8 g/dL (ref 6.0–8.3)

## 2010-04-25 LAB — PROTIME-INR
INR: 0.9 (ref 0.00–1.49)
INR: 1.3 (ref 0.00–1.49)
INR: 1.6 — ABNORMAL HIGH (ref 0.00–1.49)
INR: 1.6 — ABNORMAL HIGH (ref 0.00–1.49)
Prothrombin Time: 12.6 seconds (ref 11.6–15.2)
Prothrombin Time: 19.8 seconds — ABNORMAL HIGH (ref 11.6–15.2)

## 2010-04-25 LAB — CARDIOLIPIN ANTIBODIES, IGM+IGG: Anticardiolipin IgM: 10 [MPL'U] — ABNORMAL LOW (ref <10)

## 2010-04-25 LAB — CARDIAC PANEL(CRET KIN+CKTOT+MB+TROPI)
Relative Index: INVALID (ref 0.0–2.5)
Troponin I: 0.01 ng/mL (ref 0.00–0.06)

## 2010-04-25 LAB — CORTISOL: Cortisol, Plasma: 1.7 ug/dL

## 2010-04-25 LAB — D-DIMER, QUANTITATIVE: D-Dimer, Quant: 1.83 ug/mL-FEU — ABNORMAL HIGH (ref 0.00–0.48)

## 2010-04-25 LAB — URINALYSIS, ROUTINE W REFLEX MICROSCOPIC
Bilirubin Urine: NEGATIVE
Glucose, UA: NEGATIVE mg/dL
Hgb urine dipstick: NEGATIVE
Ketones, ur: NEGATIVE mg/dL
Specific Gravity, Urine: 1.003 — ABNORMAL LOW (ref 1.005–1.030)
pH: 5.5 (ref 5.0–8.0)

## 2010-04-25 LAB — PROTEIN S, TOTAL: Protein S Ag, Total: 104 % (ref 70–140)

## 2010-04-25 LAB — LUPUS ANTICOAGULANT PANEL: PTT Lupus Anticoagulant: 158.1 secs — ABNORMAL HIGH (ref 36.3–48.8)

## 2010-04-25 LAB — TROPONIN I: Troponin I: 0.01 ng/mL (ref 0.00–0.06)

## 2010-04-25 LAB — LIPASE, BLOOD: Lipase: 24 U/L (ref 11–59)

## 2010-04-25 LAB — HEPARIN LEVEL (UNFRACTIONATED)
Heparin Unfractionated: 0.22 IU/mL — ABNORMAL LOW (ref 0.30–0.70)
Heparin Unfractionated: 0.27 IU/mL — ABNORMAL LOW (ref 0.30–0.70)
Heparin Unfractionated: 0.32 IU/mL (ref 0.30–0.70)
Heparin Unfractionated: 0.47 IU/mL (ref 0.30–0.70)
Heparin Unfractionated: 0.63 IU/mL (ref 0.30–0.70)

## 2010-04-25 LAB — TSH: TSH: 1.501 u[IU]/mL (ref 0.350–4.500)

## 2010-04-25 LAB — CORTISOL, URINE, FREE: Volume, Urine-CORTUR: 4500 mL

## 2010-04-25 LAB — APTT: aPTT: 73 seconds — ABNORMAL HIGH (ref 24–37)

## 2010-04-25 LAB — ANTITHROMBIN III: AntiThromb III Func: 109 % (ref 75–120)

## 2010-04-25 LAB — PROTEIN C, TOTAL: Protein C, Total: 78 % (ref 70–140)

## 2010-04-25 LAB — PROTEIN C ACTIVITY: Protein C Activity: 150 % — ABNORMAL HIGH (ref 75–133)

## 2010-04-25 LAB — CK TOTAL AND CKMB (NOT AT ARMC): CK, MB: 0.7 ng/mL (ref 0.3–4.0)

## 2010-04-25 LAB — ESTROGENS, TOTAL: Estrogen: 174.7 pg/mL

## 2010-04-25 LAB — ESTRADIOL: Estradiol: 173.7 pg/mL

## 2010-05-23 NOTE — H&P (Signed)
[name], [name]             ACCOUNT NO.:  [id number]   MEDICAL RECORD NO.:  [id number]          PATIENT TYPE:  INP   LOCATION:  1233                         FACILITY:  [facility]   PHYSICIAN:  Della Goo, M.D. DATE OF BIRTH:  04-Nov-1965   DATE OF ADMISSION:  02/23/2008  DATE OF DISCHARGE:                              HISTORY & PHYSICAL   PRIMARY CARE PHYSICIAN:  Unassigned.  The patient goes to [facility].   CHIEF COMPLAINT:  Right calf pain for 5 days.   HISTORY OF PRESENT ILLNESS:  This is a 45 year old female who presents  to the emergency department with complaints of increased swelling and  pain in her left calf area for the past 5 days.  She also reports  feeling a hard area behind her knee.  She denies having any shortness of  breath or chest pain.  Also denies having any fevers, chills, cough.   The patient was evaluated in the emergency department and a CT angiogram  study was performed of her chest, results of which returned positive for  a saddle pulmonary embolus and bilateral pulmonary emboli.  When these  results were discussed with the patient, she was also asked if she had  any prior issues with blood clots or a clotting disorder and she denies  having any previous history or problems.  She denies having any family  history of persons with clotting disorders in her family.  She also  denies having any recent travel and/or being sedentary.  She also denies  having any trauma to her right lower leg.   PAST MEDICAL HISTORY:  History of anxiety and depression.   PAST SURGICAL HISTORY:  None.   ALLERGIES:  PENICILLIN, CODEINE,  ASPIRIN.   MEDICATIONS:  1. Paxil 30 mg one p.o. daily.  2. Xanax 1 mg p.o. daily.   SOCIAL HISTORY:  The patient is married.  She is a nonsmoker,  nondrinker.   FAMILY HISTORY:  Negative.   REVIEW OF SYSTEMS:  Review of systems pertinents are mentioned above.  However, the patient does report having recent  dyspnea on exertion.  She  denies having any nausea, vomiting, diarrhea, chest pain, fevers,  chills, myalgias other than those mentioned above.   PHYSICAL EXAMINATION:  GENERAL:  This is a morbidly obese 45 year old  female in mild discomfort, but no acute distress currently.  VITAL  SIGNS:  Temperature 100.6, blood pressure 126/77, heart rate 135,  respirations 20, oxygen saturation 97% on room air.  HEENT:  Normocephalic, atraumatic.  There is no scleral icterus.  Pupils  equally round, reactive to light.  Extraocular movements are intact.  Funduscopic benign.  Oropharynx is clear.  NECK:  Neck is supple, full range of motion.  No thyromegaly,  adenopathy, jugular venous distention.  CARDIOVASCULAR:  Tachycardiac  rate and rhythm.  No murmurs, gallops or rubs.  LUNGS:  Clear to auscultation bilaterally.  No rales, rhonchi or  wheezes.  ABDOMEN:  Positive bowel sounds, soft, nontender, nondistended.  EXTREMITIES:  Without cyanosis, clubbing or edema in the left leg.  The  right lower leg calf  area does have 1+ edema and is tender in the calf  area and has a positive Homan's sign.  There are no palpable superficial  venous cords.  NEUROLOGIC:  Nonfocal.   LABORATORY STUDIES:  White blood cell count 21.2, hemoglobin 15,  hematocrit 44, MCV 94.4, platelets 224,000, neutrophils 82%, lymphocytes  14%.  Chemistry with a sodium of 135, potassium 3.9, chloride 105, CO2  21, BUN 10, creatinine 1.08, glucose 105, D-dimer 1.83.  Beta  natriuretic peptide less than 30. PT 12.6, INR 0.9.  CT angiogram of the  chest reveals an acute pulmonary thromboembolism draping over the left  and right proximal main pulmonary arteries, consistent with a saddle  pulmonary embolism.  The clot burden extends into the right upper lobe  and right lower lobe and left lower lobes as well as the lingula.  The  overall clot burden is considered severe while only partially occlusive.  Also of note, there is interval  enlargement of the left adrenal gland  compared to previous study in March 2006.  Further evaluation is  recommended.  Prevascular and lower paratracheal nodes are seen and have  the appearance of being reactive.   ASSESSMENT:  A 45 year old female being admitted with  1. Bilateral pulmonary emboli AND saddle embolism.  2. Hypercoagulable state.  3. Left atrial gland mass.   PLAN:  The patient will be admitted to the step-down ICU area.  She will  be placed on an IV heparin drip and Coumadin therapy has also been  initiated.  A hypercoagulability workup has been started and blood work  has been drawn prior to the IV heparin drip initiation.  The patient  will placed on supplemental oxygen as well and placed on bedrest at this  time.  Further workup will also be forthcoming to further evaluate the  left adrenal mass.  The patient's hypercoagulability state is possibly  secondary to a malignancy.  The patient will also be placed on GI  prophylaxis at this time.  An ultrasound study of the right lower leg  will be ordered as well.      Della Goo, M.D.  Electronically Signed     HJ/MEDQ  D:  02/23/2008  T:  02/24/2008  Job:  [id number]

## 2010-05-23 NOTE — H&P (Signed)
NAMETIJANA, WALDER             ACCOUNT NO.:  [id number]   MEDICAL RECORD NO.:  [id number]          PATIENT TYPE:  INP   LOCATION:  1233                         [facility]ACILITY:  [facility]   PHYSICIAN:  Della Goo, M.D. DATE OF BIRTH:  [birth year]   DATE OF ADMISSION:  02/23/2008  DATE OF DISCHARGE:                              HISTORY & PHYSICAL   PRIMARY CARE PHYSICIAN:  Unassigned.   Dictation stopped here.      Della Goo, M.D.  Electronically Signed     HJ/MEDQ  D:  02/23/2008  T:  02/24/2008  Job:  [id number]

## 2010-05-23 NOTE — Discharge Summary (Signed)
[name], [name]             ACCOUNT NO.:  [id number]   MEDICAL RECORD NO.:  [id number]          PATIENT TYPE:  INP   LOCATION:  5733                         FACILITY:  [facility]   PHYSICIAN:  Eustaquio Boyden, MD   DATE OF BIRTH:  [birth year]   DATE OF ADMISSION:  10/24/2006  DATE OF DISCHARGE:  10/25/2006                               DISCHARGE SUMMARY   DISCHARGE DIAGNOSES:  The patient left against medical advice.   HOSPITAL COURSE:  In brief, this is a 45 year old female with a  questionable history of diverticulitis, along with GERD, depression and  anxiety, who presented with a one-day history of abdominal pain, along  with nausea and vomiting and diarrhea.  Of note, the patient had an  elevated white blood cell count to 17.8, but revealing previous medical  records, the patient has consistently had an elevated white blood cell  count between 11 and 19 white blood cells.  The plan was to admit the  patient and monitor her overnight, along with the serial abdominal exams  and supportive measures, with low threshold to start IV antibiotics if  the patient spiked a fever, given she did not have a history of fevers.  The patient was a direct admit from [place].  The patient currently does  not have a primary care physician, but says that she would like to  continue seeing Dr. Andee Poles at [place], who she has seen in the past.  Of  note, the patient presented first to the [facility] Emergency Room, at  which point she waited for 2 hours, and then went to urgent cares to  expedite being seen.   When the patient arrived on floor at [facility], we went and  evaluated the patient and she was appropriate and cooperative with exam,  answering all questions.  We initially gave her antinausea medication,  which improved her nausea, and started pain medication with Tylenol.  When the patient found out she had been given Tylenol, she requested to  speak with the doctors for a stronger  pain medicine.  Before the  physician was able to call the nurse back to give stronger pain  medication, the patient decided to leave AMA and signed paperwork.  For  full summary of presentation, please see dictated H&P.      Eustaquio Boyden, MD  Electronically Signed     JG/MEDQ  D:  10/25/2006  T:  10/27/2006  Job:  [id number]

## 2010-05-23 NOTE — Discharge Summary (Signed)
[name], [name]             ACCOUNT NO.:  [id number]   MEDICAL RECORD NO.:  [id number]          PATIENT TYPE:  INP   LOCATION:  1423                         [facility]ACILITY:  [facility]   PHYSICIAN:  Hillery Aldo, M.D.   DATE OF BIRTH:  September 18, 1965   DATE OF ADMISSION:  02/23/2008  DATE OF DISCHARGE:  02/27/2008                               DISCHARGE SUMMARY   PRIMARY CARE PHYSICIAN:  Thayer Headings, M.D. at [facility].   DISCHARGE DIAGNOSES:  1. Saddle pulmonary embolus.  2. Right lower extremity deep venous thrombosis.  3. Benign left adrenal adenoma, non-secreting  4. Chronic obstructive pulmonary disease.  5. Hypokalemia.  6. Anxiety.  7. Depression.  8. Leukocytosis, resolved.  9. Tobacco abuse.   DISCHARGE MEDICATIONS:  1. Arixtra 10 mg subcutaneously daily until told to stop by Thayer Headings, M.D.,  5 days of Coumadin/Arixtra overlap is      recommended.  2. Percocet 5/325 one to two tablets q.4h. p.r.n. pain (#30 written      for).  3. Coumadin 10 mg daily or as directed by Thayer Headings, M.D.  4. Paxil 30 mg daily.  5. Xanax 1 mg daily p.r.n.   CONSULTATIONS:  None.  Telephone consultation was made with Genene Churn.  Cyndie Chime, M.D. regarding the patient's hypercoagulability profile.   BRIEF ADMISSION HPI:  The patient is a 45 year old female with no  significant past medical history who presented to the hospital with a  chief complaint of right calf pain for 5 days prior to presentation.  Upon initial evaluation, she was noted to have an acute pulmonary  thromboembolism and therefore was admitted for further evaluation and  treatment.  A hypercoagulability profile was sent off prior to the  initiation of anticoagulation therapy.  For the full details, please see  the dictated report done by Dr. Lovell Sheehan.   PROCEDURES AND DIAGNOSTIC STUDIES:  1. Chest x-ray on February 23, 2008, showed changes consistent with      COPD and no other  acute findings.  2. CT angiogram of the chest showed acute pulmonary thromboembolism      draping over the left and right proximal main pulmonary arteries      consistent with saddle pulmonary embolism.  Clot burden extends      into the right upper lobe, right lower lobe, left lower lobes, as      well as lingula.  The overall clot burden was considered severe      while only partially occlusive.  There was interval enlargement of      left adrenal gland lesions compared to March 17, 2004.  MRI with      adrenal protocol was recommended for further evaluation.  3. MRI with adrenal gland protocol performed on February 24, 2008,      showed a benign left adrenal adenoma and simple hepatic cyst.  No      evidence of thrombus within the IVC.  4. Right lower extremity Doppler done on February 24, 2008, showed      acute DVT coursing from the anterior posterior tibial  vein through      the popliteal into the mid femoral vein.  Thrombus was occlusive      throughout until mild flow was noted in the proximal femoral vein.      No evidence of superficial thrombosis or Baker's cysts.  Left      common femoral vein indicates no propagation to the left side.   DISCHARGE LABORATORY VALUES:  PT was 20.2, INR 1.6.  White blood cell  count was 10.6, hemoglobin 13.4, hematocrit 38.6, platelets 231.  Sodium  was 137, potassium 3.9, chloride 105, bicarb 27, BUN 7, creatinine 0.62,  glucose 111.   HOSPITAL COURSE:  1. Pulmonary embolus/right lower extremity deep venous thrombosis:      The patient was admitted and monitored closely in the step-down      unit initially until she remained hemodynamically stable times 24      hours.  Anticoagulation was commenced with heparin and Coumadin      after a hypercoagulability profile was checked.  The patient had      negative.  Anticardiolipin antibodies.  There was no evidence of      protein C or protein S deficiency.  A lupus anticoagulation panel      was  abnormal with lupus anticoagulant detected.  Because of these      findings, a curbside consultation was arranged with Genene Churn.      Granfortuna, M.D. who reviewed these findings and suggested the      patient have a repeat of her lupus anticoagulant panel in      approximately 2 months when stable.  This will help determine if      she needs lifelong anticoagulation versus anticoagulation for 6      months to 1 year.  Dr. Cyndie Chime has had experiences of false-      positive while the patient's are hospitalized and therefore      recommends repeating the testing as an outpatient in 2 months.  The      patient will be discharged on Arixtra for once daily dosing and 10      mg of Coumadin.  She has a follow-up appointment at Dr. Zebedee Iba      office on Monday, March 01, 2008 at 10:30 a.m.  She can have her      PT/INR rechecked at that time and he will determine whether or not      to stop the Arixtra at that time, assuming her INR is therapeutic.  2. Left benign adrenal adenoma:  Because of the enlargement of the      adrenal adenoma seen on CT scanning, an MRI was obtained and a      hormonal evaluation was undertaken with random cortisol,      testosterone, estradiol and estrogen levels.  The patient's      hormonal evaluation was completely normal.  The mass was consistent      with a benign adrenal adenoma that is non-secretory and no further      diagnostic evaluation is needed at this time.  3. Chronic obstructive pulmonary disease in a tobacco user:  The      patient does have early findings of COPD on chest radiography.  She      has been fully advised to discontinue tobacco products.  The      patient declined a nicotine patch while in the hospital.  4. Anxiety/depression:  The patient was maintained on her usual dose  of Paxil.  5. Hypokalemia:  The patient was appropriately repleted.  6. Leukocytosis.  The patient's leukocytosis was likely due to her      acute  thromboembolic disease.  Her white blood cell count has      normalized without any specific therapy.   DISPOSITION:  The patient is medically stable and will be discharged  home.  She has appropriate follow-up arranged for Monday.   Time spent coordinating care for discharge and discharge instructions  equals 45 minutes.      Hillery Aldo, M.D.  Electronically Signed     CR/MEDQ  D:  02/27/2008  T:  02/27/2008  Job:  [id number]   cc:   Thayer Headings, M.D.

## 2010-05-23 NOTE — H&P (Signed)
NAMEANOLA, MCGOUGH             ACCOUNT NO.:  [id number]   MEDICAL RECORD NO.:  [id number]          PATIENT TYPE:  INP   LOCATION:  5733                         FACILITY:  [facility]   PHYSICIAN:  Nestor Ramp, MD        DATE OF BIRTH:  10/13/1965   DATE OF ADMISSION:  10/24/2006  DATE OF DISCHARGE:  10/25/2006                              HISTORY & PHYSICAL   CHIEF COMPLAINT:  Diverticulitis pain.   PRIMARY CARE PHYSICIAN:  Dr. Andee Poles of urgent care.   HISTORY OF PRESENT ILLNESS:  This is a 45 year old female with a history  of diverticulitis, GERD, depression, anxiety, with a one-day history of  abdominal pain.  The patient states pain started yesterday while she was  watching TV associated with nausea, vomiting and diarrhea.  The patient  also states she had a temperature to 99.5, which she classifies as a  fever.  The patient states she has had nausea for the whole day and  vomiting, which at first was just vomiting food up, and then after the  patient could not tolerate p.o., vomiting just yellow.  No blood in the  vomit.  The patient also notes 3-day history of diarrhea, but no blood.  The patient states it is just watery diarrhea.  The patient endorses  history of recent antibiotic use, and has had multiple ER visits for the  same complaints of abdominal pain and nausea, vomiting and diarrhea.  The patient carries a diagnosis of diverticulitis, which was diagnosed  by Dr. Artist Pais several years ago she states at [facility].  The patient was  scheduled to see Dr. Christella Hartigan, and saw him and was scheduled to get  colonoscopy and endoscopy, but could not follow up.  Her current pain,  the patient states, starts in her left lower quadrant and travels to the  right lower quadrant.  The patient describes the pain as sharp when it  is in the bilateral lower quadrants, and then dull in between.  The  patient states it radiates from left to right.  Currently, the patient  complains of pain a 10/10  and sharp.  Of note, the patient has also had  a OB/GYN workup, which was normal.  The patient denies any sick contacts  at home.  The patient denies any changes in eating habits, or any eating  anything differently, and no recent travel.  The patient states she has  city water at home.   REVIEW OF SYSTEMS:  No back pain, flank pain, headache, dizziness,  cough, shortness of breath, chest pain, dysuria, or vaginal discharge.  Otherwise, see HPI; otherwise negative.   PAST MEDICAL HISTORY:  1. Irritable bowel syndrome.  2. Diverticulitis.  3. GERD.  4. Tobacco abuse.  5. Depression.  6. Anxiety.   PAST SURGICAL HISTORY:  None.   ALLERGIES:  CODEINE, ASPIRIN, PENICILLIN and AMOXICILLIN all give hives.   MEDICATIONS:  1. Xanax 1 mg p.o. daily.  2. Paxil 30 mg p.o. daily.   FAMILY HISTORY:  Noncontributory per the patient.   SOCIAL HISTORY:  The patient lives at home with her  husband and two  sons, an 59 year old and a 1 year old.  The patient states she smokes 1  pack per day, and denies alcohol, denies recreational drugs.  The  patient states she does not have much fiber in her diet, and is a  housewife.   PHYSICAL EXAMINATION:  VITAL SIGNS:  Temperature 99.5, heart rate 96,  respiratory rate 18, blood pressure 118/82, O2 sat 95% on room air,  weight 108 kilograms.  GENERAL:  In no acute distress, comfortable, alert, cooperative, and  responds to questions.  HEENT:  Normocephalic, atraumatic.  Pupils equally round and reactive to  light.  Extraocular movements intact.  Moist mucous membranes.  No  pharyngeal erythema, edema or exudate.  CVS:  Normal S1, S2.  No murmurs, rubs or gallops.  PULMONARY:  Clear to auscultation bilaterally.  No crackles or wheezing.  ABDOMEN:  Soft, obese.  Normoactive bowel sounds.  Tender to deep  palpation left lower quadrant and right lower quadrant; left greater  than right.  No hepatosplenomegaly noted.  No masses noted.  No rebound  and  no guarding.  EXTREMITIES:  No clubbing, cyanosis or edema.  2+ peripheral pulses.  SKIN:  No jaundice or rash.   LABORATORY DATA:  At [place]:  White blood cells 17.8, hemoglobin 17,  hematocrit 47, platelets 233.  Urinalysis:  Blood negative, small  leukocyte esterase, negative nitrites, trace bacteria, trace mucus.  Beta HCG negative.  Glucose 87.  Abdominal series at [facility] negative.   ASSESSMENT AND PLAN:  This is a 45 year old female with a questionable  history of diverticulitis.  Also, a history per the patient of irritable  bowel syndrome, along with gastroesophageal reflux disease and pending  diagnosis of chronic abdominal and pelvic pain, and also tobacco abuse,  who presents with a one-day history of abdominal pain, nausea, vomiting  and diarrhea.  1. Abdominal pain:  Extensive workup in the past negative.  The      patient has multiple emergency room visits, and extensive imaging      of her abdomen with CT scans, MRI and ultrasound.  All mostly      negative except for several incidents of other findings, such as      history of benign adrenal adenoma, CT history of small follicular      cyst in the ovaries.  This current abdominal pain possibly      irritable bowel syndrome, rule out ovarian abscess, rule out      diverticulitis, rule out chronic appendicitis, rule out      somatization disorder.  The patient has also had OB/GYN, which of      note was negative.  GI workup is still pending given the patient      was scheduled for endoscopy and colonoscopy, but did not keep      appointment.  We will admit and monitor overnight, provide      symptomatic with nausea and pain medications.  We will hold off on      imaging for now.  We will also hold off starting antibiotic given      no evidence of fever and history of chronic elevated white blood      cells on previous emergency room visits and clinic visit.  We will      continue to monitor her closely for fever with a  low threshold to      start antibiotic therapy.  We will check ESR, although history of  normal previously.  We have hemoccult her stool, which is pending.  2. Leukocytosis:  We will repeat a CBC in the morning.  A patient with      history of multiple checks and all elevated white blood cells, but      no diagnosis.  The patient has been given antibiotics in the past.      Monitor her for fever and for increasing white blood cell count.      If so, consider antibiotic; otherwise, likely not.  We will also      check a peripheral smear.  Of note, the patient also had elevated      hemoglobin and hematocrit, question smokers polycythemia.  3. Nausea and vomiting:  Monitor for improvement on antiemetics.      Symptomatic relief.  4. FEN/GI:  Intravenous fluids for now, n.p.o.  Consider advancing      diet in the morning once the patient is able to tolerate better.  5. Prophylaxis:  Sequential compression devices and proton pump      inhibitor.  6. Disposition:  Monitor.  Pending resolution of abdominal pain,      nausea and vomiting, 23-hour observation status only.      Eustaquio Boyden, MD  Electronically Signed      Nestor Ramp, MD  Electronically Signed    JG/MEDQ  D:  10/24/2006  T:  10/26/2006  Job:  [id number]

## 2010-05-26 NOTE — Assessment & Plan Note (Signed)
[facility]                         GASTROENTEROLOGY OFFICE NOTE   [name], [name]                    MRN:          [id number]  DATE:04/09/2006                            DOB:          [birth year]    Primary care physician Dr. Dondra Spry.   PROBLEM:  1. Chronic lower abdominal discomfort.  CT scan with IV and oral      contrast November 2007 was essentially normal. February 09, 2006,      slightly elevated white count at 11.8000, otherwise normal.  CBC      normal complete metabolic profile, pregnancy test normal, lipase      normal.   INTERVAL HISTORY:  I last saw Ms. Kuchenbecker 6 weeks ago.  What struck me  most was her caffeine intake.  She was drinking a pot of coffee a day  plus some soda's.  She dramatically cut back on that to one cup a day.  She was also eating only one very large meal a day and has increased to  smaller meals.  With those changes she has not noticed much of a  difference in her daily lower abdominal discomfort.  She describes the  pain as beginning on the left lower quadrant of her abdomen radiating  around to the right and then sticking mostly on the right side of her  abdomen.  Moving her bowels does not seem to improve it.  She does admit  to periodic bright red blood per rectum on the tissue paper.  She  generally has fairly loose stools, constipation is quite rare, although  it happens about once a month and she has to strain to move her bowels.   CURRENT MEDICATIONS:  1. Lunesta.  2. Xanax.  3. Paxil.  4. Cefuroxime.  5. Nexium on a once daily basis.   PHYSICAL EXAMINATION:  Weight 249 pounds, up one pound since her last  visit, blood pressure 102/68, pulse 90.  CONSTITUTIONAL:  Generally well appearing.  LUNGS:  Clear to auscultation bilaterally.  HEART:  Regular rate and rhythm.  ABDOMEN:  Soft, nontender, nondistended, normal bowel sounds.   ASSESSMENT/PLAN:  A 45 year old women with chronic lower  abdominal  discomforts, intermittent bright red blood per rectum.   Surprised that cutting back on her very high caffeine intake and  changing the way she eats, has not made more of a difference.  Her  symptoms in the abdomen and she does see mild intermittent bright red  blood per rectum.  I think we should proceed with colonoscopy at her  soonest convenience, although I admitted to her that this probably would  be negative.  I suspect  that her symptoms are somewhat functional.  She is not anemic and so the  blood that she is seeing is not causing any systemic effects.     Rachael Fee, MD  Electronically Signed    DPJ/MedQ  DD: 04/09/2006  DT: 04/09/2006  Job #: [id number]   cc:   Barbette Hair. Artist Pais, DO

## 2010-05-26 NOTE — Assessment & Plan Note (Signed)
[facility]                           PRIMARY CARE OFFICE NOTE   [name], [name]                    MRN:          [id number]  DATE:04/01/2006                            DOB:          07-13-1965    CHIEF COMPLAINT:  New patient to practice.   HISTORY OF PRESENT ILLNESS:  The patient is a 45 year old white female  here to establish primary care.  She has a complex medical  history/medical issues.  She was previously followed by Urgent Care  physicians.  She was recently seen by Dr. Christella Hartigan of [facility] regarding  chronic abdominal pain.  She has had ongoing issues for at least one  year.  She has had multiple ER visits regarding abdominal pain, most  recently on March 30, 2006.  She is noted to have abnormal CAT scan  including abnormality near her right ovary.  She did see a gynecologist  when this was first discovered in August of 2007 and subsequent MRI was  ordered.  This showed a benign-appearing adenoma of the left adrenal  gland but also a benign-appearing small cystic lesion lying along the  posterior aspect of the uterus.  This was thought to be a benign  paraovarian cyst or less likely an endometrium.   She also has gastroesophageal reflux disease.  She prior to seeing Dr.  Christella Hartigan was consuming about 12 cups of coffee in the morning.  This was  significantly decreased and her GERD symptoms are improved; Dr. Christella Hartigan  plans to [name] upper endoscopy and lower colonoscopy.  She describes her  abdominal discomfort mainly in her lower portion of her abdomen that  radiates across from the left right side.  Pain is constant but  occasionally worsens in an intermittent manner.  During her most recent  evaluation on March 22 in the ER, she was noted to have a urinary tract  infection and also hematuria.  She does have a history of kidney stones  but no evidence of nephrolithiasis was mentioned on most recent CT scan.   Her white cell count was  also elevated during her last ER visit at 19.4.  She states that most recently she has been having left ear pain and  upper respiratory congestion.  She has a history of chronic cough due to  long-standing tobacco abuse.   She has also experienced within the last several months transient left  vision loss.  She is followed by Dr. Orlin Hilding of [facility].  Review of e-chart notes that she has had a normal  sedimentation rate.  Carotid Dopplers were performed according to the  patient which were negative.  Currently it is presumed her transient  vision loss is secondary to her history of migraine headache, and she is  to follow up with Dr. Orlin Hilding regarding a prophylactic medication.   She takes Paxil and alprazolam.  She has done so for the last 10-15  years.  This was started by her previous physician to treat chronic  anxiety disorder.  She takes Xanax on a daily basis and notes withdrawal  symptoms when she goes  more than two days without Xanax.  She has seen a  psychiatrist in the past.  She thinks it may be Dr. Katrinka Blazing.  She has not  had recent follow-up.   PAST MEDICAL HISTORY:  1. Chronic abdominal/pelvic pain of unclear significance.  2. History of nephrolithiasis.  3. Recent urinary tract infection.  4. Migraine headache.  5. Transient left-sided vision loss.  6. Depression/anxiety.  7. Gastroesophageal reflux disease.  8. Ongoing tobacco abuse with probable COPD.   CURRENT MEDICATIONS:  1. Paxil 30 mg once a day.  2. Xanax 1 mg once a day.   ALLERGIES:  1. CODEINE.  2. ASPIRIN.  3. PENICILLIN.  She gets hives with penicillin.   SOCIAL HISTORY:  The patient is married.  She has two children, ages 20  and 54.  She is currently a housewife, previously worked as a Conservation officer, nature.   FAMILY HISTORY:  Mother is alive at age 48, has some sort of arrhythmia.  Father is age 58, has an enlarged heart, is known to be diabetic.  She  denies family history of  cancer.   HABITS:  No alcohol.  She smokes one pack per day for the last 30 years.  Denies any recreational drug use.   REVIEW OF SYSTEMS:  Positive ear pain, left greater than right, nasal  congestion, sinus congestion.  The patient denies any chest pain, has  chronic cough.  Heartburn improved.  No current dysuria, frequency,  urgency.  All other systems negative.   PHYSICAL EXAMINATION:  VITAL SIGNS:  Weight is 250 pounds, temperature  is 97.6, pulse is 85, blood pressure is 115/66.  GENERAL:  In general the patient is a pleasant, obese, 45 year old white  female who appears much older than stated age.  HEENT:  Normocephalic, atraumatic.  Pupils are equal and reactive to  light bilaterally.  Extraocular muscle activity was intact.  The patient  was anicteric.  Conjunctivae were within normal limits.  Examination of  right tympanic membrane:  Mild redness.  Left tympanic membrane was  retracted, also with some redness/erythema.  Oropharyngeal exam:  Positive cobblestoning.  NECK:  Thick.  Positive adenopathy below her mandible bilaterally.  No  thyromegaly.  CHEST:  Normal respiratory effort.  The patient had decreased breath  sounds with scattered wheezing.  CARDIOVASCULAR:  Regular rate and rhythm.  No significant murmur, rubs,  or gallops appreciated.  ABDOMEN:  Protuberant.  The patient had suprapubic and left lower  quadrant tenderness.  MUSCULOSKELETAL:  No clubbing, cyanosis, or edema.  The patient had  intact pedis dorsalis pulses.  Skin was warm and dry.  NEUROLOGIC:  Cranial nerves II-XII were grossly intact.  She was  nonfocal.   LABORATORY DATA:  CBC showed WBC 19.4, H&H of 15/43.1, platelet count of  229.  This was from March 30, 2006.  Basic metabolic profile notable for  elevated glucose at 114, BUN 12, creatinine is 1.19.  LFTs from February  29 were unremarkable.  UA was positive for moderate blood, negative for  leukocytes and nitrates.  This was on March  22.  IMPRESSION/RECOMMENDATIONS:  1. Chronic pelvic pain/abdominal pain of unclear etiology.  2. Left ear pain, probable otitis media.  3. Ongoing tobacco abuse with probable chronic obstructive pulmonary      disease.  4. History of transient left vision loss.  5. Left adrenal adenoma.  6. Anxiety/depression.  7. Abnormal glucose and family history of type 2 diabetes.  8. History of nephrolithiasis.  9. History  of urinary tract infection.  10.Health maintenance.   RECOMMENDATIONS:  I suggested follow-up with urology regarding chronic  pelvic pain, although hematuria may have been from recent UTI or  nephrolithiasis.  The patient should be ruled out for bladder lesion.  In addition interstitial cystitis may be a potential etiology for  chronic pelvic discomfort.   In terms of her possible otitis media, she will be started on Ceftin 500  mg p.o. b.i.d.  She likely has chronic bronchitis and I strongly  encouraged the patient to discontinue smoking.  All of her family  members, including her children smoke and they will have a family  meeting about quitting smoking together.   In terms of chronic anxiety/depression, I have refilled her Paxil and  Xanax today.  We have discussed the potential pitfalls of long term  alprazolam use.   In terms of her transient left vision loss, I encouraged follow-up with  Dr. Orlin Hilding and we will try to obtain copies of her carotid Doppler.   We will check fasting lipids and hemoglobin A1c to rule out diabetes.  Follow-up time in approximately four weeks.     Natalie Hair. Artist Pais, [name]  Electronically Signed    RDY/MedQ  DD: 04/01/2006  DT: 04/02/2006  Job #: [id number]

## 2010-05-26 NOTE — H&P (Signed)
NAMESOLENE, HEREFORD             ACCOUNT NO.:  [id number]   MEDICAL RECORD NO.:  [id number]          PATIENT TYPE:  EMS   LOCATION:  ED                           FACILITY:  [facility]   PHYSICIAN:  Lonia Blood, M.D.DATE OF BIRTH:  1965/12/09   DATE OF ADMISSION:  08/03/2005  DATE OF DISCHARGE:                                HISTORY & PHYSICAL   PRIMARY CARE PHYSICIAN:  Unassigned/[facility].   CHIEF COMPLAINT:  Severe abdominal pain.   HISTORY OF PRESENT ILLNESS:  [name] is a very pleasant 45-year-  old female, who was in her usual state of health until Sunday (it is  presently Friday).  Sunday, she experienced a self-limited episode of watery  diarrhea.  By Monday she was feeling better.  Tuesday however, she began to  experience diffuse cramping abdominal pain.  She presented to her primary  care physician at [facility].  She was diagnosed with a viral  gastroenteritis per her report and given medication for symptomatic control.  She returned home.  She reported that she was feeling better.  She was able  to tolerate clear liquid intake and light meals.  Wednesday she had no  cramping pain and Thursday throughout the day she was asymptomatic.  Thursday evening however, she was awakened from sleep with an intense,  sharp, deep, stabbing pain in the left upper quadrant and middle regions of  the abdomen.  This pain was also present in the flank.  This pain was severe  enough that it caused her to catch her breath.  Since that time, the pain  has been constant.  There are periods in which it wanes somewhat but it  never completely disappears.  There are also times however, in which it  peaks.  There has been no recurrent diarrhea.  There has been no severe  constipation.  There has been no evidence of hematochezia, hematemesis or  melena. There has not been a significant weight gain or weight loss over the  past several months.  There has been no  chest pain or shortness of breath.  There has been no unilateral or lower extremity edema.  There is no personal  history of blood clot formation in the past and no family history of such.  The patient has never experienced similar symptoms.  There has specifically  been no abdominal trauma or generalized trauma.   REVIEW OF SYMPTOMS:  Comprehensive review of systems is totally unremarkable  with exception of elements noted in the history of present illness above.   PAST MEDICAL HISTORY:  1. Tobacco abuse in the amount of 1 pack per day since teen years.  2. Depression.  3. Anxiety.  4. Status post spontaneous vaginal deliveries x2 with no significant      complications.  5. Obesity.  6. Migraine headaches.   MEDICATIONS:  1. Paxil 30 mg daily.  2. Flexeril 10 mg t.i.d.  3. Xanax 1 mg t.i.d.  4. Lunesta p.r.n.   ALLERGIES:  PATIENT REPORTS ALLERGIES TO PENICILLIN, CODEINE AND ASPIRIN.   FAMILY HISTORY:  The patient's mother is  alive and healthy.  The patient's  father is alive and suffers with diabetes, cardiomyopathy and renal disease.  He has not had heart attack as best patient can remember.  The patient has 2  siblings, who are alive and healthy with no significant medical problems.  There is specifically no history of GI, ovarian or lymph  node cancers per  the patient's best knowledge.  There is no history of blood clotting  disorders.   SOCIAL HISTORY:  The patient lives in [place].  She is presently  unemployed.  She has 2 healthy children.  She is married.  She does not  drink alcohol.   DATA REVIEW:  White count is elevated at 12.1.  Hemoglobin is 15 with  platelet count of 271 and MCV is normal.  Sodium is low at 130, potassium,  chloride, bicarb, BUN, and creatinine are all normal.  Calcium is normal.  Serum glucose is elevated at 128.  Urinalysis is positive with evidence of  small leukocyte esterase and 3-6 white blood cells.  CT scan of the abdomen   reveals a left adrenal mass.  It is felt this represents adenoma but this is  not a definitive statement.  Suggestion is made that MRI be pursued.  A  triangular hypodensity in the posterior spleen is also noted, which is  suspicious for a splenic infarct.  A comment is made that mass or laceration  cannot be ruled out.   PHYSICAL EXAMINATION:  VITAL SIGNS:  Temperature 97.3, blood pressure  116/79, heart rate 119, decreased to 88 with IV fluids, respiratory rate 24  and O2 sat 94% on room air.  GENERAL:  Obese female in no acute respiratory distress.  HEENT:  Normocephalic, atraumatic.  Pupils equal, round and reactive to  light and accommodation.  Extraocular muscles are intact bilaterally.  OC/OP  is clear.  NECK:  No JVD or lymphadenopathy though the patient is obese and has a short  neck and exam is difficult.  LUNGS:  Clear to auscultation bilaterally without wheezes or rhonchi.  CARDIOVASCULAR:  Regular rate and rhythm with distant heart sounds but no  appreciable murmur, gallop or rub.  ABDOMEN:  Morbidly obese, soft, tender to deep palpation throughout but  definitely much worse in the left upper and lower quadrants.  There is no  rebound.  There is no ascites that is appreciable though it would be  difficult to note this due to the patient's body  habitus.  There is no  specific point tenderness.  I am not able to appreciate organomegaly  EXTREMITIES:  Trace bilateral lower extremity edema without significant  erythema, cyanosis or clubbing.  NEUROLOGIC:  Alert and oriented x4.  Cranial nerves II-XII intact  bilaterally.  5/5 strength in bilateral upper and lower extremities.  Intact  sensation to touch throughout.   IMPRESSION AND PLAN:  1. Splenic infarct - at this time we cannot definitively say that the      findings on the patient's CAT scan do in fact represent a splenic      infarct.  Her clinical course however does fit this picture.  At the     present time, I am  not able to clearly identify an etiology for the      patient's splenic infarction.  This patient would likely be high risk      for arterial disease and this could simply represent a arterial      embolization.  Certainly, the patient does not  have sickle cell      disease.  There has been no trauma.  Mononucleosis is a possibility but      the patient's clinical symptoms do not fit this picture as would      classically.  A malignancy is another concern given the patient's      adrenal findings and moderate lymphadenopathy of the pelvis.  A      hypercoagulable state must also be considered.  The patient is being      admitted to the hospital for her intractable pain.  MRI of abdomen will      be obtained.  Multiple lab studies to help narrow the differential as      discussed above will be carried out.  Further testing will be directed      toward any positives appreciated in this evaluation.  The patient will      be dosed with prophylactic dose of Lovenox at the present time, but      there is no clear indication for full dose anticoagulation.  2. Questionable adrenal adenoma versus mass - as noted above, CT scan of      abdomen raises the question of a left adrenal mass.  Further evaluation      will be carried out with a MRI.  The patient does not have signs or      symptoms suggestive of a significant adrenal dysfunction.  Should her      hyponatremia continue to worsen, should she develop hyperkalemia, or if      she should develop unexplained hypotension and tachycardia, we will      need to consider the possibility of adrenal insufficiency and stress      dose steroids.  At present, there is no indication for this treatment.  3. Pelvic lymphadenopathy - CT scan of the pelvis raises the question of 1-      2 possibly enlarged lymph nodes.  It is certainly possible that these      areas do not represent lymph nodes and represent some type of other      pathology.  MRI of the  abdomen and pelvis should help [place] further      describe this area.  Further evaluation will be carried out as      necessary.  4. Pyelonephritis - the patient does appear to be suffering with a mild      pyelonephritis.  She has an elevated white count and an abnormal number      of white cells in her urine.  It certainly is possible that her      pyelonephritis could be the etiology of all the patient's symptoms and      the hypodense lesion within the spleen could represent an old infarct      that is just now being found incidentally.  I will treat the patient      empirically with Rocephin at this time.  Urine will be sent for culture      and antibiotics will be further tailored based upon the results of her      culture.  5. Tobacco abuse - the patient has been counseled to the multiple      deleterious effects of ongoing tobacco abuse.  She has been advised      that she will not be allowed to smoke during this hospital stay.  She     is being encouraged to use this as the first step  in her journey      towards complete abstinence.  The patient refuses nicotine patch plus      she said it made me feel really funny when she has tried it in the      past.  6. Mild hyponatremia - the patient's mild hyponatremia is felt to be      secondary to a hypovolemic hyponatremia.  We will gently hydrate the      patient and we will follow her sodium closely.  7. Elevated serum glucose - the patient has an elevated serum glucose at      128.  It is not clear how blood drawing was timed in relation to dosing      of IV fluids in the emergency room.  It certainly is possible this      elevated serum glucose is simply secondary to the administration of      dextrose via IV.  I will check a hemoglobin A1c.  If this is elevated      we will pursue formal diagnosis of diabetes mellitus.      Lonia Blood, M.D.  Electronically Signed     JTM/MEDQ  D:  08/03/2005  T:  08/03/2005  Job:   [id number]   cc:   Guerry Bruin, M.D.  [facility]

## 2010-05-26 NOTE — Discharge Summary (Signed)
[name], [name]             ACCOUNT NO.:  [id number]   MEDICAL RECORD NO.:  [id number]          PATIENT TYPE:  INP   LOCATION:  5704                         FACILITY:  [facility]   PHYSICIAN:  Madaline Savage, MD        DATE OF BIRTH:  [birth year]   DATE OF ADMISSION:  10/07/2005  DATE OF DISCHARGE:  10/08/2005                                 DISCHARGE SUMMARY   PRIMARY CARE PHYSICIAN:  The patient sees Dr. Merla Riches at the Urgent Care.   DISCHARGE DIAGNOSES:  1. Nausea and vomiting secondary to acute gastroenteritis.  2. Musculoskeletal lumbar pain.  3. Leukocytosis.  4. Urinary tract infection.  5. Anxiety, depression.  6. Tobacco abuse.   HISTORY OF PRESENT ILLNESS:  Ms. [name] is a 45 year old lady who  presented to the ER with complaints of severe lower abdominal pain, nausea  and vomiting for the last couple of days.  She sees Dr. Merla Riches at the  Urgent Care.  Low back pain is described as dull and achy pain; and, in the  ER, urinalysis showed a small amount of leukocyte esterase and the rest of  her exam was negative, except for an elevated white count of 13.9.  She  continues to have nausea and vomiting.  She was then admitted for symptom  control.   HOSPITAL COURSE:  During the course of her hospital stay, she was given IV  fluids.  Nausea was controlled with Phenergan and Zofran and, now, her  nausea is much better.  Her white count has come down from 13,000 to 7,000.  She was started on ciprofloxacin for urinary tract infection.  Her UA was  not that impressive.  Her urine culture was negative.  She was also  complaining of lumbar pain for the last couple of days and so a MRI was done  on October 07, 2005 of her lumbar spine which showed mild lumbar  degenerative change with areas of annular bulging and facet arthropathy.  There was no evidence of significant spinal stenosis or nerve root  encroachment.  The most likely cause of her back pain is most likely  musculoskeletal and she will be given Ultram for her pain.  I do not want to  give her NSAIDs at this point in time because she is recovering from an  acute gastritis.  She also has anxiety for which she takes Xanax.  She is  also on Paxil and Lunesta.  Now, she is being discharged home in stable  condition.   DISPOSITION:  She will be discharged home. She will followup with Dr.  Merla Riches who she has been following up with as needed.  She is also in the  process of finding a new primary care physician.   DISCHARGE INSTRUCTIONS:  Activity as tolerated.  Diet:  I have asked her to  increase her fiber in her diet because she does complain of alternating  constipation and diarrhea.   DISCHARGE MEDICATIONS:  1. Ciprofloxacin 500 mg twice daily for 3 more days.  2. Ultram 50 mg, 3 times daily as needed.  3. Paxil 40  mg once daily.  4. Xanax 1 mg 3 times daily as needed.  5. Ibuprofen 800 mg 3 times as needed.  6. Phenergan 25 mg up to 4 times daily as needed.  7. Lunesta, as she is taking at home.      Madaline Savage, MD  Electronically Signed     PKN/MEDQ  D:  10/08/2005  T:  10/08/2005  Job:  [id number]

## 2010-05-26 NOTE — Assessment & Plan Note (Signed)
[facility]                         GASTROENTEROLOGY OFFICE NOTE   [name], [name]                    MRN:          [id number]  DATE:02/26/2006                            DOB:          Nov 25, 1965    The patient was referred by Dr. Berton Mount at the [facility] emergency  room.   PRIMARY CARE PHYSICIAN:  None.   REASON FOR REFERRAL:  Dr. Graciela Husbands asked me to evaluate [name] in  consultation regarding chronic abdominal pains.   HISTORY OF PRESENT ILLNESS:  [name] is a pleasant 45 year old  woman who has had 1-2 years of lower abdominal discomfort.  She  describes a pain that usually begins in her left lower quadrant and  radiates across her lower abdomen to the right side.  The pain occurs  intermittently in the past and is now more of a constant, chronic basis.  She said food does not always tend to bring it on.  She has intermittent  constipation and loose stools but she does not have any change in her  pains when she moves her bowels usually.  She has had no overt lower GI  bleeding.   She also has rather chronic pyrosis.  She says for years she will have  burning in her chest.  She takes Mylanta and Tums on daily basis.  She  has no dysphagia.  She has never tried anything stronger for her upper  GI symptoms.   She was evaluated by Dr. Wandalee Ferdinand in his office about a year ago.  He  recommended gynecologic evaluation as well as a colonoscopy.  She  cancelled her colonoscopy and has not followed up with him since then.   She has had recent imaging tests and labs done.  Labs 1 week ago show  white blood cell count of 11.8; otherwise a normal CBC.  She has a  normal complete metabolic profile.  Lipase was normal.  Pregnancy test  was negative.  She had a CT scan with IV and oral contrast done November  2007 and it was essentially normal.   REVIEW OF SYSTEMS:  Notable for 5- to 10-pound weight in the past year.  Is otherwise  essentially normal and is available on her nursing intake  sheet.   PAST MEDICAL HISTORY:  Anxiety, depression, question kidney stones.   CURRENT MEDICINES:  Lunesta, Xanax, Paxil.   ALLERGIES:  CODEINE, ASPIRIN, AMOXICILLIN, and PENICILLIN.   SOCIAL HISTORY:  Married with two children.  Smokes daily.  Did not  drink alcohol.  Works as a housewife.  Drinks a full pot of coffee on a  daily basis, as well as one to two other caffeinated drinks (sodas).   FAMILY HISTORY:  No colon cancer or colon polyps in the family.   PHYSICAL EXAMINATION:  VITAL SIGNS:  Height 5 feet 7 inches, weight 248  pounds.  Blood pressure 112/84, pulse 88.  CONSTITUTIONAL:  Obese, otherwise well-appearing.  NEUROLOGIC:  Alert and oriented x3.  EYES:  Extraocular movements intact.  MOUTH:  Oropharynx moist, no lesions.  NECK:  Supple, no lymphadenopathy.  CARDIOVASCULAR:  Heart  regular rate and rhythm.  LUNGS:  Clear to auscultation bilaterally.  ABDOMEN:  Soft, nontender, nondistended, normal bowel sounds.  EXTREMITIES:  No lower extremity edema.  SKIN:  No rashes or lesions on visible extremities.   ASSESSMENT AND PLAN:  A 45 year old woman with likely chronic  gastroesophageal reflux disease, chronic lower abdominal discomfort.   I am most struck by her caffeine intake.  She drinks a full pot of  coffee as well as a couple of other caffeinated beverages on a daily  basis.  This can certainly contribute to GERD and also can cause some  lower GI discomforts as well, usually diarrhea.  I recommended very  strongly that she cut back dramatically on her caffeine to drinking one  cup a day at most.  She also has a rather unusual eating pattern when  she eats only one meal a day at supper and eats nothing else other than  that.  I therefore recommended she try to have two to three smaller  meals rather than one giant meal a day.  Lastly, I have given her  samples for a proton pump inhibitor.  She will take  one pill once daily  20-30 minutes prior to her supper meal.  She will return to see me in 4  weeks' time.  She may need colonoscopy and/or upper endoscopy, depending  on how her symptoms go with the conservative measures described above.   Lastly, she does not have a primary care physician and is interested in  getting set up with one and so we have made a referral to our primary  care division here at [facility].     Rachael Fee, MD  Electronically Signed    DPJ/MedQ  DD: 02/26/2006  DT: 02/26/2006  Job #: [id number]   cc:   Duke Salvia, MD, [facility]

## 2010-05-26 NOTE — H&P (Signed)
[name], [name]             ACCOUNT NO.:  [id number]   MEDICAL RECORD NO.:  [id number]          PATIENT TYPE:  EMS   LOCATION:  ED                           FACILITY:  [facility]   PHYSICIAN:  Della Goo, M.D. DATE OF BIRTH:  1965/09/21   DATE OF ADMISSION:  10/06/2005  DATE OF DISCHARGE:                                HISTORY & PHYSICAL   CHIEF COMPLAINT:  Abdominal pain, nausea and vomiting, and continued back  pain.   HISTORY OF PRESENT ILLNESS:  This is a 45 year old female who presented to  the emergency department secondary to complaints of severe lower abdominal  pain, nausea and vomiting over the past 2 days.  The patient reports also  having diarrhea.  She denies having any fevers or chills.  She does report  also having low back pain which radiates into both legs and has had this  pain for 2 months.  The low back pain is described as dull achy pain that  goes into the back of both legs.  The patient has been seen in the emergency  department numerous times for evaluations as well as at the Urgent Care in  which she sees her primary care physician, Dr. Merla Riches.  The patient had a  GYN evaluation done yesterday, October 05, 2005, by GYN Miguel Aschoff, M.D.  as an outpatient and also had a transvaginal ultrasound reports of which the  patient states were negative for any acute findings.   PAST MEDICAL HISTORY:  Significant for anxiety, depression, gastroesophageal  reflux disease.   PAST SURGICAL HISTORY:  None.   MEDICATIONS:  1. Paxil 40 mg one p.o. q.a.m.  2. Xanax 1 mg one p.o. t.i.d.  3. Ibuprofen 800 mg one p.o. t.i.d.   ALLERGIES:  AMOXICILLIN, PENICILLIN, ASPIRIN, CODEINE, all of which cause  rash and hives.   SOCIAL HISTORY:  The patient is married with two children.  She smokes one  pack per day for 20 years.  Nondrinker.   FAMILY HISTORY:  Father with hypertension, coronary artery disease, and  diabetes.   REVIEW OF SYSTEMS:  As mentioned above  as well as no headaches.  Previous  history of constipation.  The patient reports only having bowel movements  twice a month up until recently and now has loose stools.  The patient  denies having any syncope, dizziness, weakness, weight loss, melena,  hematochezia, or any other joint muscle aches other than what is mentioned  above.   PHYSICAL EXAMINATION:  GENERAL:  This is an obese, 45 year old female in  discomfort, but no acute distress.  VITAL SIGNS:  Temperature 99.7, blood pressure 113/70, heart rate 100,  respirations 18, O2 saturations 99% on room air.  HEENT:  Normocephalic and atraumatic.  Pupils equal, round, and reactive to  light.  Extraocular muscles are intact.  Funduscopic examination benign.  Oropharynx is clear.  NECK:  Supple.  Full range of motion.  No thyromegaly, adenopathy, or  jugular venous distention.  CARDIOVASCULAR:  Mild tachycardia.  Rate is regular.  No murmurs, rubs, or  gallops.  LUNGS:  Clear to auscultation bilaterally.  ABDOMEN:  Positive bowel sounds, soft, and nontender.  Obese, nondistended.  No hepatosplenomegaly.  EXTREMITIES:  Without cyanosis, clubbing, or edema.  NEUROLOGY:  The patient is alert and oriented x3.  Cranial nerves II-XII  grossly intact.  Motor and sensory function are also intact.  5/5 strength  throughout.  Full range of motion.  Deep tendon reflexes are 2/4 throughout.  VASCULAR:  Dorsal pedal pulses bilaterally 2+.   LABORATORY DATA:  White blood cell count 13.9, hemoglobin 16.0, hematocrit  46.1, platelets 236, neutrophils at 62%, lymphocytes 30%. Sodium 140,  potassium 3.5, chloride 107, CO2 25, BUN 14, creatinine 1.3, and glucose  104.  Total bilirubin 0.7, AST 17, ALT 14, alkaline phosphatase 67, albumin  3.4.  Urinalysis negative except for small amount of leukocyte esterase.  Urine hCG negative and lipase level 26.   ASSESSMENT:  A 45 year old female with symptoms of nausea, vomiting, and  lower abdominal pain x2  days along with constant lower back pain for 2  months, being admitted for:  1. Acute gastroenteritis.  2. Sciatica bilateral lower extremities.  3. Lumbar pain.  4. Leukocytosis.  5. Tobacco abuse.  6. Anxiety and depression.   ACTIVITY:  The patient has been admitted for control of her symptoms of  nausea, vomiting, and pain.  An MRI of the lumbosacral spine has been  ordered to evaluate for a cause of sciatica.  Urine for Gonorrhea and  Chlamydia has also been ordered.  The patient will be placed on GI and DVT  prophylaxis as well.      Della Goo, M.D.  Electronically Signed     HJ/MEDQ  D:  10/06/2005  T:  10/07/2005  Job:  [id number]

## 2010-07-04 ENCOUNTER — Emergency Department (HOSPITAL_COMMUNITY): Payer: Self-pay

## 2010-07-04 ENCOUNTER — Emergency Department (HOSPITAL_COMMUNITY)
Admission: EM | Admit: 2010-07-04 | Discharge: 2010-07-04 | Disposition: A | Discharge status: Home / Self Care | Payer: Self-pay | Attending: Emergency Medicine | Admitting: Emergency Medicine

## 2010-07-04 DIAGNOSIS — R000 Tachycardia, unspecified: Secondary | ICD-10-CM | POA: Insufficient documentation

## 2010-07-04 DIAGNOSIS — R1031 Right lower quadrant pain: Secondary | ICD-10-CM | POA: Insufficient documentation

## 2010-07-04 DIAGNOSIS — R112 Nausea with vomiting, unspecified: Secondary | ICD-10-CM | POA: Insufficient documentation

## 2010-07-04 DIAGNOSIS — F411 Generalized anxiety disorder: Secondary | ICD-10-CM | POA: Insufficient documentation

## 2010-07-04 LAB — CBC
Platelets: 243 10*3/uL (ref 150–400)
RBC: 4.38 MIL/uL (ref 3.87–5.11)
RDW: 13.6 % (ref 11.5–15.5)
WBC: 12 10*3/uL — ABNORMAL HIGH (ref 4.0–10.5)

## 2010-07-04 LAB — URINALYSIS, ROUTINE W REFLEX MICROSCOPIC
Glucose, UA: NEGATIVE mg/dL
Hgb urine dipstick: NEGATIVE
Ketones, ur: NEGATIVE mg/dL
pH: 5.5 (ref 5.0–8.0)

## 2010-07-04 LAB — DIFFERENTIAL
Basophils Absolute: 0 10*3/uL (ref 0.0–0.1)
Basophils Relative: 0 % (ref 0–1)
Eosinophils Absolute: 0.2 10*3/uL (ref 0.0–0.7)
Eosinophils Relative: 1 % (ref 0–5)
Neutrophils Relative %: 67 % (ref 43–77)

## 2010-07-04 LAB — BASIC METABOLIC PANEL
Chloride: 102 mEq/L (ref 96–112)
Creatinine, Ser: 1 mg/dL (ref 0.50–1.10)
GFR calc Af Amer: >60 mL/min (ref >60)
Potassium: 3.7 mEq/L (ref 3.5–5.1)
Sodium: 134 mEq/L — ABNORMAL LOW (ref 135–145)

## 2010-07-04 LAB — URINE MICROSCOPIC-ADD ON

## 2010-08-08 ENCOUNTER — Emergency Department (HOSPITAL_COMMUNITY)
Admission: EM | Admit: 2010-08-08 | Discharge: 2010-08-08 | Disposition: A | Discharge status: Home / Self Care | Payer: Self-pay | Attending: Emergency Medicine | Admitting: Emergency Medicine

## 2010-08-08 DIAGNOSIS — F411 Generalized anxiety disorder: Secondary | ICD-10-CM | POA: Insufficient documentation

## 2010-08-08 DIAGNOSIS — Z760 Encounter for issue of repeat prescription: Secondary | ICD-10-CM | POA: Insufficient documentation

## 2010-08-29 ENCOUNTER — Emergency Department (HOSPITAL_COMMUNITY): Payer: Self-pay

## 2010-08-29 ENCOUNTER — Emergency Department (HOSPITAL_COMMUNITY)
Admission: EM | Admit: 2010-08-29 | Discharge: 2010-08-29 | Disposition: A | Discharge status: Home / Self Care | Payer: Self-pay | Attending: Emergency Medicine | Admitting: Emergency Medicine

## 2010-08-29 DIAGNOSIS — K5730 Diverticulosis of large intestine without perforation or abscess without bleeding: Secondary | ICD-10-CM | POA: Insufficient documentation

## 2010-08-29 DIAGNOSIS — I498 Other specified cardiac arrhythmias: Secondary | ICD-10-CM | POA: Insufficient documentation

## 2010-08-29 DIAGNOSIS — R1031 Right lower quadrant pain: Secondary | ICD-10-CM | POA: Insufficient documentation

## 2010-08-29 DIAGNOSIS — R112 Nausea with vomiting, unspecified: Secondary | ICD-10-CM | POA: Insufficient documentation

## 2010-08-29 DIAGNOSIS — F411 Generalized anxiety disorder: Secondary | ICD-10-CM | POA: Insufficient documentation

## 2010-08-29 LAB — URINALYSIS, ROUTINE W REFLEX MICROSCOPIC
Bilirubin Urine: NEGATIVE
Glucose, UA: NEGATIVE mg/dL
Ketones, ur: NEGATIVE mg/dL
pH: 5.5 (ref 5.0–8.0)

## 2010-08-29 LAB — COMPREHENSIVE METABOLIC PANEL
AST: 13 U/L (ref 0–37)
Albumin: 3.7 g/dL (ref 3.5–5.2)
Alkaline Phosphatase: 69 U/L (ref 39–117)
BUN: 15 mg/dL (ref 6–23)
Chloride: 103 mEq/L (ref 96–112)
Potassium: 3.6 mEq/L (ref 3.5–5.1)
Total Bilirubin: 0.1 mg/dL — ABNORMAL LOW (ref 0.3–1.2)

## 2010-08-29 LAB — CBC
Hemoglobin: 15.2 g/dL — ABNORMAL HIGH (ref 12.0–15.0)
MCV: 97.2 fL (ref 78.0–100.0)
Platelets: 288 10*3/uL (ref 150–400)
RBC: 4.69 MIL/uL (ref 3.87–5.11)
WBC: 11.9 10*3/uL — ABNORMAL HIGH (ref 4.0–10.5)

## 2010-08-29 LAB — DIFFERENTIAL
Eosinophils Absolute: 0.2 10*3/uL (ref 0.0–0.7)
Lymphocytes Relative: 29 % (ref 12–46)
Lymphs Abs: 3.5 10*3/uL (ref 0.7–4.0)
Neutro Abs: 7.4 10*3/uL (ref 1.7–7.7)
Neutrophils Relative %: 62 % (ref 43–77)

## 2010-08-29 LAB — LIPASE, BLOOD: Lipase: 30 U/L (ref 11–59)

## 2010-08-29 LAB — URINE MICROSCOPIC-ADD ON

## 2010-08-29 MED ORDER — IOHEXOL 300 MG/ML  SOLN
125.0000 mL | Freq: Once | INTRAMUSCULAR | Status: AC | PRN
  Administered 2010-08-29: 125 mL via INTRAVENOUS

## 2010-09-28 LAB — URINALYSIS, ROUTINE W REFLEX MICROSCOPIC
Protein, ur: NEGATIVE
Urobilinogen, UA: 0.2

## 2010-09-28 LAB — CBC
MCV: 93.9
Platelets: 254
WBC: 14.8 — ABNORMAL HIGH

## 2010-09-28 LAB — COMPREHENSIVE METABOLIC PANEL
AST: 19
Albumin: 3.6
Chloride: 109
Creatinine, Ser: 1.16
GFR calc Af Amer: >60
Potassium: 4
Total Bilirubin: 0.7

## 2010-09-28 LAB — DIFFERENTIAL
Basophils Absolute: 0.1
Eosinophils Relative: 1
Lymphocytes Relative: 19
Monocytes Absolute: 0.7
Monocytes Relative: 5

## 2010-09-29 LAB — COMPREHENSIVE METABOLIC PANEL
ALT: 22
AST: 16
AST: 19
Albumin: 3.6
Alkaline Phosphatase: 84
CO2: 22
Chloride: 109
Chloride: 106
GFR calc Af Amer: >60
GFR calc Af Amer: >60
GFR calc non Af Amer: 54 — ABNORMAL LOW
Potassium: 3.9
Potassium: 3.7
Sodium: 142
Sodium: 137
Total Bilirubin: 0.5
Total Bilirubin: 0.8
Total Protein: 6.6

## 2010-09-29 LAB — URINALYSIS, ROUTINE W REFLEX MICROSCOPIC
Bilirubin Urine: NEGATIVE
Glucose, UA: NEGATIVE
Glucose, UA: NEGATIVE
Hgb urine dipstick: NEGATIVE
Hgb urine dipstick: NEGATIVE
Ketones, ur: NEGATIVE
Protein, ur: 30 — AB
Specific Gravity, Urine: 1.033 — ABNORMAL HIGH
Urobilinogen, UA: 0.2
pH: 6

## 2010-09-29 LAB — RAPID URINE DRUG SCREEN, HOSP PERFORMED
Amphetamines: NOT DETECTED
Barbiturates: NOT DETECTED
Benzodiazepines: POSITIVE — AB
Cocaine: NOT DETECTED
Opiates: NOT DETECTED

## 2010-09-29 LAB — CBC
Platelets: 274
RDW: 14.1
WBC: 14.8 — ABNORMAL HIGH

## 2010-09-29 LAB — DIFFERENTIAL
Basophils Relative: 0
Eosinophils Absolute: 0.1
Monocytes Relative: 5
Neutrophils Relative %: 72

## 2010-09-29 LAB — URINE MICROSCOPIC-ADD ON

## 2010-10-02 LAB — URINE MICROSCOPIC-ADD ON

## 2010-10-02 LAB — CBC
HCT: 42.3
Hemoglobin: 15.6 — ABNORMAL HIGH
Hemoglobin: 15.1 — ABNORMAL HIGH
MCHC: 35.3
MCHC: 35.7
MCV: 92
RBC: 4.54
RDW: 14.3
RDW: 14.1

## 2010-10-02 LAB — DIFFERENTIAL
Eosinophils Relative: 2
Lymphocytes Relative: 20
Lymphocytes Relative: 34
Lymphs Abs: 3.6
Monocytes Absolute: 1
Monocytes Relative: 5
Monocytes Relative: 8
Neutro Abs: 13 — ABNORMAL HIGH
Neutro Abs: 7.7
Neutrophils Relative %: 73

## 2010-10-02 LAB — COMPREHENSIVE METABOLIC PANEL
ALT: 18
BUN: 12
CO2: 24
Calcium: 9.1
Creatinine, Ser: 1.25 — ABNORMAL HIGH
GFR calc non Af Amer: 47 — ABNORMAL LOW
Glucose, Bld: 113 — ABNORMAL HIGH
Sodium: 139
Total Protein: 6.6

## 2010-10-02 LAB — BASIC METABOLIC PANEL
CO2: 19
Calcium: 8.5
GFR calc Af Amer: 59 — ABNORMAL LOW
GFR calc non Af Amer: 49 — ABNORMAL LOW
Glucose, Bld: 103 — ABNORMAL HIGH
Potassium: 3.6
Sodium: 140

## 2010-10-02 LAB — LIPASE, BLOOD: Lipase: 19

## 2010-10-02 LAB — URINALYSIS, ROUTINE W REFLEX MICROSCOPIC
Bilirubin Urine: NEGATIVE
Glucose, UA: NEGATIVE
Glucose, UA: NEGATIVE
Hgb urine dipstick: NEGATIVE
Hgb urine dipstick: NEGATIVE
Protein, ur: NEGATIVE
Protein, ur: NEGATIVE
Urobilinogen, UA: 0.2
Urobilinogen, UA: 0.2

## 2010-10-02 LAB — URINE CULTURE: Colony Count: 60000

## 2010-10-02 LAB — WET PREP, GENITAL
Trich, Wet Prep: NONE SEEN
WBC, Wet Prep HPF POC: NONE SEEN
Yeast Wet Prep HPF POC: NONE SEEN

## 2010-10-02 LAB — GC/CHLAMYDIA PROBE AMP, GENITAL: GC Probe Amp, Genital: NEGATIVE

## 2010-10-05 LAB — DIFFERENTIAL
Basophils Absolute: 0.1
Basophils Relative: 1
Eosinophils Absolute: 0.2
Eosinophils Relative: 1
Lymphocytes Relative: 34
Monocytes Absolute: 0.7

## 2010-10-05 LAB — LIPASE, BLOOD: Lipase: 20

## 2010-10-05 LAB — COMPREHENSIVE METABOLIC PANEL
ALT: 25
AST: 21
Albumin: 3.5
Alkaline Phosphatase: 77
CO2: 23
Chloride: 108
Creatinine, Ser: 1.1
GFR calc Af Amer: >60
GFR calc non Af Amer: 54 — ABNORMAL LOW
Potassium: 3.6
Sodium: 139
Total Bilirubin: 0.5

## 2010-10-05 LAB — URINALYSIS, ROUTINE W REFLEX MICROSCOPIC
Glucose, UA: NEGATIVE
Hgb urine dipstick: NEGATIVE
Nitrite: NEGATIVE
Specific Gravity, Urine: 1.031 — ABNORMAL HIGH
pH: 5.5

## 2010-10-05 LAB — CBC
MCV: 93.2
Platelets: 219
RBC: 4.86
WBC: 11.3 — ABNORMAL HIGH

## 2010-10-09 LAB — URINALYSIS, ROUTINE W REFLEX MICROSCOPIC
Bilirubin Urine: NEGATIVE
Glucose, UA: NEGATIVE
Hgb urine dipstick: NEGATIVE
Protein, ur: NEGATIVE
Urobilinogen, UA: 0.2

## 2010-10-09 LAB — COMPREHENSIVE METABOLIC PANEL
AST: 16
Albumin: 3.6
Alkaline Phosphatase: 75
BUN: 14
CO2: 19
Chloride: 108
GFR calc Af Amer: >60
GFR calc non Af Amer: 58 — ABNORMAL LOW
Potassium: 3.8
Total Bilirubin: 0.7

## 2010-10-09 LAB — URINE MICROSCOPIC-ADD ON

## 2010-10-09 LAB — CBC
HCT: 47.7 — ABNORMAL HIGH
Platelets: 206
RBC: 5.01
WBC: 13 — ABNORMAL HIGH

## 2010-10-09 LAB — DIFFERENTIAL
Basophils Absolute: 0
Basophils Relative: 0
Eosinophils Relative: 1
Monocytes Absolute: 0.7

## 2010-10-11 LAB — COMPREHENSIVE METABOLIC PANEL
AST: 18
BUN: 6
CO2: 22
Chloride: 108
Creatinine, Ser: 1.06
GFR calc Af Amer: >60
GFR calc non Af Amer: 57 — ABNORMAL LOW
Glucose, Bld: 94
Total Bilirubin: 0.7

## 2010-10-11 LAB — URINALYSIS, ROUTINE W REFLEX MICROSCOPIC
Bilirubin Urine: NEGATIVE
Glucose, UA: NEGATIVE
Hgb urine dipstick: NEGATIVE
Ketones, ur: NEGATIVE
Protein, ur: NEGATIVE

## 2010-10-11 LAB — CBC
MCHC: 34
RBC: 4.91

## 2010-10-13 LAB — DIFFERENTIAL
Basophils Absolute: 0.2 — ABNORMAL HIGH
Basophils Relative: 0 % (ref 0–1)
Eosinophils Relative: 1
Lymphocytes Relative: 28
Lymphs Abs: 2.9 10*3/uL (ref 0.7–4.0)
Lymphs Abs: 4
Monocytes Absolute: 0.9
Monocytes Relative: 3 % (ref 3–12)
Neutro Abs: 19.1 10*3/uL — ABNORMAL HIGH (ref 1.7–7.7)
Neutrophils Relative %: 83 % — ABNORMAL HIGH (ref 43–77)

## 2010-10-13 LAB — WET PREP, GENITAL
Trich, Wet Prep: NONE SEEN
Yeast Wet Prep HPF POC: NONE SEEN

## 2010-10-13 LAB — URINE CULTURE: Colony Count: 5000

## 2010-10-13 LAB — BASIC METABOLIC PANEL
BUN: 10
CO2: 24
GFR calc non Af Amer: 51 — ABNORMAL LOW
Glucose, Bld: 101 — ABNORMAL HIGH
Potassium: 3.5

## 2010-10-13 LAB — CBC
HCT: 47.6 % — ABNORMAL HIGH (ref 36.0–46.0)
Hemoglobin: 16 g/dL — ABNORMAL HIGH (ref 12.0–15.0)
Hemoglobin: 15.4 — ABNORMAL HIGH
MCHC: 33.6 g/dL (ref 30.0–36.0)
MCV: 96.3 fL (ref 78.0–100.0)
MCV: 93.4
RBC: 4.73
RDW: 14.1 % (ref 11.5–15.5)
WBC: 14.3 — ABNORMAL HIGH

## 2010-10-13 LAB — URINE MICROSCOPIC-ADD ON

## 2010-10-13 LAB — URINALYSIS, ROUTINE W REFLEX MICROSCOPIC
Bilirubin Urine: NEGATIVE
Glucose, UA: NEGATIVE mg/dL
Glucose, UA: NEGATIVE
Hgb urine dipstick: NEGATIVE
Ketones, ur: NEGATIVE
Nitrite: NEGATIVE
Protein, ur: 30 mg/dL — AB
Urobilinogen, UA: 0.2 mg/dL (ref 0.0–1.0)
pH: 6

## 2010-10-13 LAB — GC/CHLAMYDIA PROBE AMP, GENITAL: GC Probe Amp, Genital: NEGATIVE

## 2010-10-13 LAB — COMPREHENSIVE METABOLIC PANEL
BUN: 9 mg/dL (ref 6–23)
Calcium: 9 mg/dL (ref 8.4–10.5)
Creatinine, Ser: 1.23 mg/dL — ABNORMAL HIGH (ref 0.4–1.2)
Glucose, Bld: 106 mg/dL — ABNORMAL HIGH (ref 70–99)
Total Protein: 6.6 g/dL (ref 6.0–8.3)

## 2010-10-17 LAB — CBC
Hemoglobin: 15.4 — ABNORMAL HIGH
MCHC: 35.2
MCV: 92.3
RBC: 4.74

## 2010-10-17 LAB — DIFFERENTIAL
Basophils Absolute: 0.1
Basophils Relative: 1
Eosinophils Absolute: 0.2
Lymphs Abs: 4.3 — ABNORMAL HIGH
Neutrophils Relative %: 58

## 2010-10-17 LAB — URINALYSIS, ROUTINE W REFLEX MICROSCOPIC
Bilirubin Urine: NEGATIVE
Hgb urine dipstick: NEGATIVE
Protein, ur: NEGATIVE
Urobilinogen, UA: 0.2

## 2010-10-17 LAB — COMPREHENSIVE METABOLIC PANEL
ALT: 25
CO2: 22
Calcium: 8.8
GFR calc non Af Amer: >60
Glucose, Bld: 109 — ABNORMAL HIGH
Sodium: 137
Total Bilirubin: 0.6

## 2010-10-17 LAB — LIPASE, BLOOD: Lipase: 22

## 2010-10-18 LAB — OCCULT BLOOD X 1 CARD TO LAB, STOOL: Fecal Occult Bld: NEGATIVE

## 2010-10-19 LAB — COMPREHENSIVE METABOLIC PANEL
ALT: 20
AST: 20
Albumin: 3.6
Alkaline Phosphatase: 78
Calcium: 8.6
GFR calc Af Amer: >60
Potassium: 3.3 — ABNORMAL LOW
Sodium: 139
Total Protein: 6.4

## 2010-10-19 LAB — URINALYSIS, ROUTINE W REFLEX MICROSCOPIC
Glucose, UA: NEGATIVE
Hgb urine dipstick: NEGATIVE
Ketones, ur: NEGATIVE
Protein, ur: NEGATIVE
pH: 6

## 2010-10-19 LAB — URINE MICROSCOPIC-ADD ON

## 2010-10-19 LAB — CBC
Hemoglobin: 15.7 — ABNORMAL HIGH
Platelets: 234
RDW: 13.9

## 2010-10-19 LAB — POCT PREGNANCY, URINE: Preg Test, Ur: NEGATIVE

## 2010-10-19 LAB — DIFFERENTIAL
Basophils Relative: 0
Eosinophils Absolute: 0.3
Lymphs Abs: 3.5 — ABNORMAL HIGH
Monocytes Absolute: 0.8 — ABNORMAL HIGH
Monocytes Relative: 5

## 2010-10-23 LAB — CBC
MCHC: 34.7
MCV: 93.2
Platelets: 217
RBC: 4.59
WBC: 14.7 — ABNORMAL HIGH

## 2010-10-23 LAB — COMPREHENSIVE METABOLIC PANEL
ALT: 19
AST: 18
Albumin: 3.6
CO2: 23
Calcium: 8.6
Creatinine, Ser: 1.44 — ABNORMAL HIGH
GFR calc Af Amer: 48 — ABNORMAL LOW
GFR calc non Af Amer: 40 — ABNORMAL LOW
Sodium: 137
Total Protein: 6.6

## 2010-10-23 LAB — URINALYSIS, ROUTINE W REFLEX MICROSCOPIC
Bilirubin Urine: NEGATIVE
Glucose, UA: NEGATIVE
Hgb urine dipstick: NEGATIVE
Ketones, ur: NEGATIVE
Nitrite: NEGATIVE
Specific Gravity, Urine: 1.007
pH: 6

## 2010-10-23 LAB — DIFFERENTIAL
Eosinophils Absolute: 0.3
Eosinophils Relative: 2
Lymphocytes Relative: 29
Lymphs Abs: 4.2 — ABNORMAL HIGH
Monocytes Absolute: 0.9 — ABNORMAL HIGH
Monocytes Relative: 7

## 2010-10-23 LAB — URINE CULTURE
Colony Count: NO GROWTH
Culture: NO GROWTH

## 2010-10-23 LAB — LIPASE, BLOOD: Lipase: 25

## 2010-10-24 LAB — COMPREHENSIVE METABOLIC PANEL
ALT: 21
CO2: 23
Calcium: 8.7
Creatinine, Ser: 1.07
GFR calc Af Amer: >60
GFR calc non Af Amer: 56 — ABNORMAL LOW
Glucose, Bld: 102 — ABNORMAL HIGH
Sodium: 140
Total Protein: 6.4

## 2010-10-24 LAB — URINALYSIS, ROUTINE W REFLEX MICROSCOPIC
Glucose, UA: NEGATIVE
Hgb urine dipstick: NEGATIVE
Specific Gravity, Urine: 1.016
pH: 6

## 2010-10-24 LAB — URINE MICROSCOPIC-ADD ON

## 2010-10-24 LAB — URINE CULTURE: Culture: NO GROWTH

## 2010-10-24 LAB — DIFFERENTIAL
Eosinophils Absolute: 0.2
Lymphocytes Relative: 25
Lymphs Abs: 3.4 — ABNORMAL HIGH
Monocytes Relative: 6
Neutrophils Relative %: 67

## 2010-10-24 LAB — CBC
Hemoglobin: 14.6
MCHC: 34.6
MCV: 92.7
RDW: 14.7 — ABNORMAL HIGH

## 2010-10-24 LAB — PREGNANCY, URINE: Preg Test, Ur: NEGATIVE

## 2010-10-24 LAB — ETHANOL: Alcohol, Ethyl (B): <5

## 2010-10-25 LAB — URINALYSIS, ROUTINE W REFLEX MICROSCOPIC
Glucose, UA: NEGATIVE
Hgb urine dipstick: NEGATIVE
Protein, ur: NEGATIVE
Specific Gravity, Urine: 1.022
pH: 5.5

## 2010-10-25 LAB — CBC
HCT: 42.8
Platelets: 226
RBC: 4.64
WBC: 14.5 — ABNORMAL HIGH

## 2010-10-25 LAB — COMPREHENSIVE METABOLIC PANEL
AST: 19
Albumin: 3.1 — ABNORMAL LOW
Alkaline Phosphatase: 66
BUN: 8
CO2: 23
Chloride: 105
GFR calc non Af Amer: 55 — ABNORMAL LOW
Potassium: 3.3 — ABNORMAL LOW
Total Bilirubin: 0.6

## 2010-10-25 LAB — DIFFERENTIAL
Basophils Absolute: 0
Basophils Relative: 0
Eosinophils Relative: 2
Monocytes Absolute: 0.8 — ABNORMAL HIGH

## 2010-10-26 LAB — URINALYSIS, ROUTINE W REFLEX MICROSCOPIC
Bilirubin Urine: NEGATIVE
Glucose, UA: NEGATIVE
Glucose, UA: NEGATIVE
Hgb urine dipstick: NEGATIVE
Ketones, ur: NEGATIVE
Nitrite: NEGATIVE
Protein, ur: NEGATIVE
Specific Gravity, Urine: 1.021
Specific Gravity, Urine: 1.022
Urobilinogen, UA: 1
pH: 5.5
pH: 5.5

## 2010-10-26 LAB — URINE CULTURE
Colony Count: 50000
Colony Count: >=100000

## 2010-10-26 LAB — COMPREHENSIVE METABOLIC PANEL
AST: 20
Albumin: 3.5
Albumin: 3.6
Alkaline Phosphatase: 74
Alkaline Phosphatase: 76
BUN: 20
Chloride: 109
GFR calc Af Amer: >60
Potassium: 3.6
Potassium: 3.7
Sodium: 139
Total Bilirubin: 0.6
Total Protein: 6.4

## 2010-10-26 LAB — DIFFERENTIAL
Basophils Absolute: 0.1
Basophils Absolute: 0.3 — ABNORMAL HIGH
Basophils Relative: 1
Basophils Relative: 2 — ABNORMAL HIGH
Eosinophils Absolute: 0.4
Eosinophils Relative: 2
Eosinophils Relative: 3
Lymphocytes Relative: 25
Lymphocytes Relative: 28
Lymphs Abs: 4.1 — ABNORMAL HIGH
Monocytes Absolute: 0.6
Monocytes Absolute: 0.8 — ABNORMAL HIGH
Monocytes Relative: 4
Monocytes Relative: 5
Neutro Abs: 9.4 — ABNORMAL HIGH
Neutrophils Relative %: 64

## 2010-10-26 LAB — COMPREHENSIVE METABOLIC PANEL WITH GFR
ALT: 24
AST: 22
CO2: 25
Calcium: 8.6
Chloride: 105
Creatinine, Ser: 1.42 — ABNORMAL HIGH
GFR calc Af Amer: 49 — ABNORMAL LOW
GFR calc non Af Amer: 41 — ABNORMAL LOW
Glucose, Bld: 101 — ABNORMAL HIGH
Total Bilirubin: 0.7

## 2010-10-26 LAB — CBC
HCT: 43.2
Hemoglobin: 14.9
MCHC: 34.5
MCV: 91.7
Platelets: 216
Platelets: 275
RBC: 4.72
RDW: 13.1
WBC: 14.8 — ABNORMAL HIGH
WBC: 15.8 — ABNORMAL HIGH

## 2010-10-26 LAB — URINE MICROSCOPIC-ADD ON

## 2010-10-26 LAB — LIPASE, BLOOD
Lipase: 19
Lipase: 27

## 2011-03-07 ENCOUNTER — Encounter (HOSPITAL_COMMUNITY): Payer: Self-pay | Admitting: Emergency Medicine

## 2011-03-07 ENCOUNTER — Emergency Department (HOSPITAL_COMMUNITY)
Admission: EM | Admit: 2011-03-07 | Discharge: 2011-03-07 | Disposition: A | Discharge status: Home / Self Care | Payer: Self-pay | Attending: Emergency Medicine | Admitting: Emergency Medicine

## 2011-03-07 ENCOUNTER — Emergency Department (HOSPITAL_COMMUNITY): Payer: Self-pay

## 2011-03-07 ENCOUNTER — Other Ambulatory Visit: Payer: Self-pay

## 2011-03-07 DIAGNOSIS — F17200 Nicotine dependence, unspecified, uncomplicated: Secondary | ICD-10-CM | POA: Insufficient documentation

## 2011-03-07 DIAGNOSIS — F411 Generalized anxiety disorder: Secondary | ICD-10-CM | POA: Insufficient documentation

## 2011-03-07 DIAGNOSIS — Z86718 Personal history of other venous thrombosis and embolism: Secondary | ICD-10-CM | POA: Insufficient documentation

## 2011-03-07 DIAGNOSIS — F419 Anxiety disorder, unspecified: Secondary | ICD-10-CM

## 2011-03-07 DIAGNOSIS — R791 Abnormal coagulation profile: Secondary | ICD-10-CM | POA: Insufficient documentation

## 2011-03-07 DIAGNOSIS — Z86711 Personal history of pulmonary embolism: Secondary | ICD-10-CM | POA: Insufficient documentation

## 2011-03-07 DIAGNOSIS — R0602 Shortness of breath: Secondary | ICD-10-CM | POA: Insufficient documentation

## 2011-03-07 DIAGNOSIS — R000 Tachycardia, unspecified: Secondary | ICD-10-CM | POA: Insufficient documentation

## 2011-03-07 DIAGNOSIS — D72829 Elevated white blood cell count, unspecified: Secondary | ICD-10-CM | POA: Insufficient documentation

## 2011-03-07 HISTORY — DX: Anxiety disorder, unspecified: F41.9

## 2011-03-07 LAB — CBC
Hemoglobin: 16.9 g/dL — ABNORMAL HIGH (ref 12.0–15.0)
MCH: 33.1 pg (ref 26.0–34.0)
MCHC: 34.2 g/dL (ref 30.0–36.0)
MCV: 96.9 fL (ref 78.0–100.0)
RBC: 5.1 MIL/uL (ref 3.87–5.11)

## 2011-03-07 LAB — POCT I-STAT, CHEM 8
BUN: 16 mg/dL (ref 6–23)
Calcium, Ion: 1.09 mmol/L — ABNORMAL LOW (ref 1.12–1.32)
Chloride: 105 mEq/L (ref 96–112)
Glucose, Bld: 119 mg/dL — ABNORMAL HIGH (ref 70–99)
TCO2: 27 mmol/L (ref 0–100)

## 2011-03-07 MED ORDER — IOHEXOL 240 MG/ML SOLN
100.0000 mL | Freq: Once | INTRAMUSCULAR | Status: AC | PRN
  Administered 2011-03-07: 100 mL via INTRAVENOUS

## 2011-03-07 MED ORDER — LORAZEPAM 2 MG/ML IJ SOLN
1.0000 mg | Freq: Once | INTRAMUSCULAR | Status: AC
  Administered 2011-03-07: 1 mg via INTRAVENOUS
  Filled 2011-03-07: qty 1

## 2011-03-07 MED ORDER — SODIUM CHLORIDE 0.9 % IV BOLUS (SEPSIS)
500.0000 mL | INTRAVENOUS | Status: AC
  Administered 2011-03-07: 500 mL via INTRAVENOUS

## 2011-03-07 NOTE — ED Provider Notes (Signed)
History     CSN: [id number]  Arrival date & time 03/07/11  0027   First MD Initiated Contact with Patient 03/07/11 [phone]      Chief Complaint  Patient presents with  . Shortness of Breath   this is a pleasant, 46 year old female with a known history of anxiety and panic disorder. She states she is on Paxil and Xanax and was told by her primary doctor to take an extra Xanax. When she has "panic attacks." States she began to feel anxiety and "hyperventilation" earlier this evening. She took an X. Xanax and is starting to feel better. She's had no chest pain or pleuritic pain. She hasn't had no shortness of breath. Minimal nausea, but no vomiting. No back pain. Denies any calf pain or tenderness. 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 She's had no fever or cough. No dizziness. No focal, motor weakness. No slurred speech or blurred vision.  (Consider location/radiation/quality/duration/timing/severity/associated sxs/prior treatment) HPI  Past Medical History  Diagnosis Date  . PE (pulmonary embolism)   . Anxiety     History reviewed. No pertinent past surgical history.  No family history on file.  History  Substance Use Topics  . Smoking status: Current Everyday Smoker -- 1.0 packs/day    Types: Cigarettes  . Smokeless tobacco: Not on file  . Alcohol Use: No    OB History    Grav Para Term Preterm Abortions TAB SAB Ect Mult Living                  Review of Systems  All other systems reviewed and are negative.    Allergies  Amoxicillin; Aspirin; Codeine; and Penicillins  Home Medications   Current Outpatient Rx  Name Route Sig Dispense Refill  . ALPRAZOLAM 1 MG PO TABS Oral Take 1 mg by mouth 2 (two) times daily.    Marland Kitchen PAROXETINE HCL 40 MG PO TABS Oral Take 40 mg by mouth every morning.      BP 173/99  Pulse 125  Temp(Src) 98 F (36.7 C) (Oral)  Resp 16  Wt 275 lb (124.739 kg)  SpO2 98%   LMP 03/07/2011  Physical Exam  Nursing note and vitals reviewed. Constitutional: She is oriented to person, place, and time. She appears well-developed and well-nourished.       Mild anxiety, no acute distress, conversant, and pleasant.  HENT:  Head: Normocephalic and atraumatic.  Eyes: Conjunctivae and EOM are normal. Pupils are equal, round, and reactive to light.  Neck: Neck supple.  Cardiovascular: Normal rate.  Exam reveals no gallop and no friction rub.   No murmur heard.      Slightly tachycardic, no murmur, rub, or gallop  Pulmonary/Chest: Effort normal and breath sounds normal. No respiratory distress. She has no wheezes. She has no rales. She exhibits no tenderness.  Abdominal: Soft. Bowel sounds are normal. She exhibits no distension. There is no tenderness. There is no rebound and no guarding.  Musculoskeletal: Normal range of motion. She exhibits no edema.  Neurological: She is alert and oriented to person, place, and time. No cranial nerve deficit. Coordination normal.  Skin: Skin is warm and dry. No rash noted.  Psychiatric: She has a normal mood and affect.    ED Course  Procedures (including critical care time)   Labs Reviewed  CBC  D-DIMER, QUANTITATIVE   No results found.   No diagnosis found.  MDM  Patient is seen and examined, initial history and physical is completed. Evaluation initiated    Date: 03/07/2011  Rate: 119  Rhythm: sinus tachycardia  QRS Axis: normal  Intervals: normal  ST/T Wave abnormalities: nonspecific ST changes  Conduction Disutrbances:none  Narrative Interpretation:   Old EKG Reviewed: changes noted Low voltage   Full workup initiated.  Will also give IVF and ativan IV    Results for orders placed during the hospital encounter of 03/07/11  CBC      Component Value Range   WBC 17.7 (*) 4.0 - 10.5 (K/uL)   RBC 5.10  3.87 - 5.11 (MIL/uL)   Hemoglobin 16.9 (*) 12.0 - 15.0 (g/dL)   HCT 82.9 (*) 56.2 - 46.0 (%)    MCV 96.9  78.0 - 100.0 (fL)   MCH 33.1  26.0 - 34.0 (pg)   MCHC 34.2  30.0 - 36.0 (g/dL)   RDW 13.0  86.5 - 78.4 (%)   Platelets 281  150 - 400 (K/uL)  D-DIMER, QUANTITATIVE      Component Value Range   D-Dimer, Quant 0.79 (*) 0.00 - 0.48 (ug/mL-FEU)  POCT I-STAT, CHEM 8      Component Value Range   Sodium 138  135 - 145 (mEq/L)   Potassium 4.5  3.5 - 5.1 (mEq/L)   Chloride 105  96 - 112 (mEq/L)   BUN 16  6 - 23 (mg/dL)   Creatinine, Ser 6.96 (*) 0.50 - 1.10 (mg/dL)   Glucose, Bld 295 (*) 70 - 99 (mg/dL)   Calcium, Ion 2.84 (*) 1.12 - 1.32 (mmol/L)   TCO2 27  0 - 100 (mmol/L)   Hemoglobin 18.0 (*) 12.0 - 15.0 (g/dL)   HCT 13.2 (*) 44.0 - 46.0 (%)  POCT I-STAT TROPONIN I      Component Value Range   Troponin i, poc 0.00  0.00 - 0.08 (ng/mL)   Comment 3           POCT PREGNANCY, URINE      Component Value Range   Preg Test, Ur NEGATIVE  NEGATIVE    Dg Chest 2 View  03/07/2011  *RADIOLOGY REPORT*  Clinical Data: Anxiety and tachycardia.  CHEST - 2 VIEW  Comparison: 06/06/2009  Findings: Two views of the chest demonstrate clear lungs. Heart and mediastinum are within normal limits.  Mild hyperinflation appears unchanged. Bony structures are intact.  The trachea is midline.  IMPRESSION: No acute chest findings.  Original Report Authenticated By: Richarda Overlie, M.D.   Ct Angio Chest W/cm &/or Wo Cm  03/07/2011  *RADIOLOGY REPORT*  Clinical Data: Shortness of breath; leukocytosis.  Elevated D- dimer.  History of smoking.  History of pulmonary embolus.  CT ANGIOGRAPHY CHEST  Technique:  Multidetector CT imaging of the chest using the standard protocol during bolus administration of intravenous contrast. Multiplanar reconstructed images including MIPs were obtained and reviewed to evaluate the vascular anatomy.  Contrast:  100 mL of Omnipaque 350 IV contrast  Comparison: Chest radiograph performed earlier today at 01:16 a.m., and CTA of the chest performed 04/11/2010  Findings: There is no  evidence of pulmonary embolus.  Mild emphysema is noted within the upper lung lobes bilaterally. Minimal bilateral atelectasis is noted.  The lungs are otherwise grossly clear.  There is no evidence of significant focal consolidation, pleural effusion or pneumothorax.  No masses are identified; no abnormal focal contrast enhancement is seen.  An enlarged 1.4 cm right hilar node is seen, and a mildly enlarged 1.2  cm subcarinal node is noted.  Scattered smaller bilateral hilar and mediastinal nodes are appreciated.  No axillary lymphadenopathy is seen.  The visualized portions of the thyroid gland are unremarkable in appearance.  The visualized portions of the liver and spleen are unremarkable. A tiny lipoma is noted within the right pectoralis muscle.  No acute osseous abnormalities are seen.  IMPRESSION:  1.  No evidence of pulmonary embolus. 2.  Mild emphysema within the upper lung lobes bilaterally; minimal bilateral atelectasis seen. 3.  Enlarged 1.4 cm right hilar node, and mildly enlarged 1.2 cm subcarinal node.  Original Report Authenticated By: Tonia Ghent, M.D.      Medications  ALPRAZolam (XANAX) 1 MG tablet (not administered)  PARoxetine (PAXIL) 40 MG tablet (not administered)  LORazepam (ATIVAN) injection 1 mg (1 mg Intravenous Given 03/07/11 0157)  sodium chloride 0.9 % bolus 500 mL (500 mL Intravenous Given 03/07/11 0157)  iohexol (OMNIPAQUE) 240 MG/ML injection 100 mL (100 mL Intravenous Contrast Given 03/07/11 0435)     Patient's lab studies revealed a negative troponin. Negative bypass. Mild elevation in white blood cell count, nonspecific. Hemoglobin was hemoconcentrated. Patient's electrolyte panel was normal. Mild elevation of creatinine. Patient did undergo a CT angiogram. This showed no evidence of pulmonary embolus with mild emphysema and enlarged. Hilar lymph nodes.   Patient feels much better and wants to go, home. She'll be discharged home in stable and improved condition.  Patient was told to continue her home medications and follow up with her doctor in the next 2 days to be reassessed. Return to ED for any concerns or changing symptoms    Debi Cousin A. Patrica Duel, MD 03/07/11 [phone]

## 2011-03-07 NOTE — ED Notes (Signed)
Pt alert, nad, c/o sob, onset last pm, states " i thought it was anxiety so i took a xanax", resp even unlabored, skin moist, denies chestpain

## 2011-03-07 NOTE — ED Notes (Signed)
Bed:WA05<BR> Expected date:<BR> Expected time:<BR> Means of arrival:<BR> Comments:<BR> Triage 2

## 2011-03-07 NOTE — Discharge Instructions (Signed)
Anxiety and Panic Attacks Your caregiver has informed you that you are having an anxiety or panic attack. There may be many forms of this. Most of the time these attacks come suddenly and without warning. They come at any time of day, including periods of sleep, and at any time of life. They may be strong and unexplained. Although panic attacks are very scary, they are physically harmless. Sometimes the cause of your anxiety is not known. Anxiety is a protective mechanism of the body in its fight or flight mechanism. Most of these perceived danger situations are actually nonphysical situations (such as anxiety over losing a job). CAUSES  The causes of an anxiety or panic attack are many. Panic attacks may occur in otherwise healthy people given a certain set of circumstances. There may be a genetic cause for panic attacks. Some medications may also have anxiety as a side effect. SYMPTOMS  Some of the most common feelings are:  Intense terror.   Dizziness, feeling faint.   Hot and cold flashes.   Fear of going crazy.   Feelings that nothing is real.   Sweating.   Shaking.   Chest pain or a fast heartbeat (palpitations).   Smothering, choking sensations.   Feelings of impending doom and that death is near.   Tingling of extremities, this may be from over-breathing.   Altered reality (derealization).   Being detached from yourself (depersonalization).  Several symptoms can be present to make up anxiety or panic attacks. DIAGNOSIS  The evaluation by your caregiver will depend on the type of symptoms you are experiencing. The diagnosis of anxiety or panic attack is made when no physical illness can be determined to be a cause of the symptoms. TREATMENT  Treatment to prevent anxiety and panic attacks may include:  Avoidance of circumstances that cause anxiety.   Reassurance and relaxation.   Regular exercise.   Relaxation therapies, such as yoga.   Psychotherapy with a  psychiatrist or therapist.   Avoidance of caffeine, alcohol and illegal drugs.   Prescribed medication.  SEEK IMMEDIATE MEDICAL CARE IF:   You experience panic attack symptoms that are different than your usual symptoms.   You have any worsening or concerning symptoms.  Document Released: 12/25/2004 Document Revised: 09/06/2010 Document Reviewed: 04/28/2009 [facility] Patient Information 2012 [place], [place].   Tachycardia, Nonspecific In adults, the heart normally beats between 60 and 100 times a minute. A heart rate over 100 is called tachycardia. When your heart beats too fast, it may not be able to pump enough blood to the rest of the body. CAUSES   Exercise or exertion.   Fever.   Pain or injury.   Infection.   Loss of fluid (dehydration).   Overactive thyroid.   Lack of red blood cells (anemia).   Anxiety.   Alcohol.   Heart arrhythmia.   Caffeine.   Tobacco products.   Diet pills.   Street drugs.   Heart disease.  SYMPTOMS  Palpitations (rapid or irregular heartbeat).   Dizziness.   Tiredness (fatigue).   Shortness of breath.  DIAGNOSIS  After an exam and taking a history, your caregiver may order:  Blood tests.   Electrocardiogram (EKG).   Heart monitor.  TREATMENT  Treatment will depend on the cause and potential for harm. It may include:  Intravenous (IV) replacement of fluids or blood.   Antidote or reversal medicines.   Changes in your present medicines.   Lifestyle changes.  HOME CARE INSTRUCTIONS   Get  rest.   Drink enough water and fluids to keep your urine clear or pale yellow.   Avoid:   Caffeine.   Nicotine.   Alcohol.   Stress.   Chocolate.   Stimulants.   Only take medicine as directed by your caregiver.  SEEK IMMEDIATE MEDICAL CARE IF:   You have pain in your chest, upper arms, jaw, or neck.   You become weak, dizzy, or feel faint.   You have palpitations that will not go away.   You throw up  (vomit), have diarrhea, or pass blood.   You look pale and your skin is cool and wet.  MAKE SURE YOU:   Understand these instructions.   Will watch your condition.   Will get help right away if you are not doing well or get worse.  Document Released: 02/02/2004 Document Revised: 09/06/2010 Document Reviewed: 12/25/2004 [facility] Patient Information 2012 [place], [place].

## 2011-05-23 ENCOUNTER — Other Ambulatory Visit: Payer: Self-pay | Admitting: Internal Medicine

## 2011-05-23 DIAGNOSIS — N63 Unspecified lump in unspecified breast: Secondary | ICD-10-CM

## 2011-05-29 ENCOUNTER — Other Ambulatory Visit: Payer: Self-pay

## 2012-03-18 ENCOUNTER — Emergency Department (HOSPITAL_COMMUNITY)
Admission: EM | Admit: 2012-03-18 | Discharge: 2012-03-18 | Disposition: A | Discharge status: Home / Self Care | Payer: BC Managed Care – PPO | Attending: Emergency Medicine | Admitting: Emergency Medicine

## 2012-03-18 ENCOUNTER — Encounter (HOSPITAL_COMMUNITY): Payer: Self-pay | Admitting: *Deleted

## 2012-03-18 ENCOUNTER — Emergency Department (HOSPITAL_COMMUNITY): Payer: BC Managed Care – PPO

## 2012-03-18 DIAGNOSIS — R1031 Right lower quadrant pain: Secondary | ICD-10-CM | POA: Insufficient documentation

## 2012-03-18 DIAGNOSIS — Z3202 Encounter for pregnancy test, result negative: Secondary | ICD-10-CM | POA: Insufficient documentation

## 2012-03-18 DIAGNOSIS — F17200 Nicotine dependence, unspecified, uncomplicated: Secondary | ICD-10-CM | POA: Insufficient documentation

## 2012-03-18 DIAGNOSIS — R1909 Other intra-abdominal and pelvic swelling, mass and lump: Secondary | ICD-10-CM | POA: Insufficient documentation

## 2012-03-18 DIAGNOSIS — Z86711 Personal history of pulmonary embolism: Secondary | ICD-10-CM | POA: Insufficient documentation

## 2012-03-18 DIAGNOSIS — F411 Generalized anxiety disorder: Secondary | ICD-10-CM | POA: Insufficient documentation

## 2012-03-18 DIAGNOSIS — Z79899 Other long term (current) drug therapy: Secondary | ICD-10-CM | POA: Insufficient documentation

## 2012-03-18 LAB — CBC WITH DIFFERENTIAL/PLATELET
Basophils Absolute: 0 10*3/uL (ref 0.0–0.1)
HCT: 46.6 % — ABNORMAL HIGH (ref 36.0–46.0)
Hemoglobin: 15.8 g/dL — ABNORMAL HIGH (ref 12.0–15.0)
Lymphocytes Relative: 22 % (ref 12–46)
Lymphs Abs: 3.6 10*3/uL (ref 0.7–4.0)
Monocytes Absolute: 0.8 10*3/uL (ref 0.1–1.0)
Monocytes Relative: 5 % (ref 3–12)
Neutro Abs: 11.6 10*3/uL — ABNORMAL HIGH (ref 1.7–7.7)
RBC: 4.85 MIL/uL (ref 3.87–5.11)
RDW: 13.5 % (ref 11.5–15.5)
WBC: 16.2 10*3/uL — ABNORMAL HIGH (ref 4.0–10.5)

## 2012-03-18 LAB — COMPREHENSIVE METABOLIC PANEL
AST: 14 U/L (ref 0–37)
CO2: 19 mEq/L (ref 19–32)
Chloride: 104 mEq/L (ref 96–112)
Creatinine, Ser: 1.01 mg/dL (ref 0.50–1.10)
GFR calc non Af Amer: 65 mL/min — ABNORMAL LOW (ref >90)
Glucose, Bld: 239 mg/dL — ABNORMAL HIGH (ref 70–99)
Total Bilirubin: 0.1 mg/dL — ABNORMAL LOW (ref 0.3–1.2)

## 2012-03-18 LAB — URINE MICROSCOPIC-ADD ON

## 2012-03-18 LAB — URINALYSIS, ROUTINE W REFLEX MICROSCOPIC
Glucose, UA: >1000 mg/dL — AB
Specific Gravity, Urine: 1.03 (ref 1.005–1.030)
pH: 5 (ref 5.0–8.0)

## 2012-03-18 LAB — POCT PREGNANCY, URINE: Preg Test, Ur: NEGATIVE

## 2012-03-18 LAB — WET PREP, GENITAL

## 2012-03-18 MED ORDER — HYDROCODONE-ACETAMINOPHEN 5-325 MG PO TABS: 1.0000 | ORAL_TABLET | ORAL | Status: DC | PRN

## 2012-03-18 MED ORDER — ONDANSETRON HCL 4 MG PO TABS: 8.0000 mg | ORAL_TABLET | Freq: Three times a day (TID) | ORAL | Status: DC | PRN

## 2012-03-18 MED ORDER — SODIUM CHLORIDE 0.9 % IV SOLN
Freq: Once | INTRAVENOUS | Status: AC
Start: 2012-03-18 — End: 2012-03-18
  Administered 2012-03-18: 05:00:00 via INTRAVENOUS

## 2012-03-18 MED ORDER — IOHEXOL 300 MG/ML  SOLN
100.0000 mL | Freq: Once | INTRAMUSCULAR | Status: AC | PRN
  Administered 2012-03-18: 100 mL via INTRAVENOUS

## 2012-03-18 MED ORDER — PAROXETINE HCL 20 MG PO TABS: 40.0000 mg | ORAL_TABLET | ORAL | Status: DC

## 2012-03-18 MED ORDER — HYDROMORPHONE HCL PF 1 MG/ML IJ SOLN
1.0000 mg | Freq: Once | INTRAMUSCULAR | Status: AC
Start: 2012-03-18 — End: 2012-03-18
  Administered 2012-03-18: 1 mg via INTRAVENOUS
  Filled 2012-03-18: qty 1

## 2012-03-18 MED ORDER — IOHEXOL 300 MG/ML  SOLN
50.0000 mL | Freq: Once | INTRAMUSCULAR | Status: AC | PRN
  Administered 2012-03-18: 50 mL via ORAL

## 2012-03-18 MED ORDER — ONDANSETRON HCL 4 MG/2ML IJ SOLN
4.0000 mg | Freq: Once | INTRAMUSCULAR | Status: AC
  Administered 2012-03-18: 4 mg via INTRAVENOUS
  Filled 2012-03-18: qty 2

## 2012-03-18 MED ORDER — ALPRAZOLAM 1 MG PO TABS: 1.0000 mg | ORAL_TABLET | Freq: Two times a day (BID) | ORAL | Status: DC

## 2012-03-18 MED ORDER — HYDROMORPHONE HCL PF 1 MG/ML IJ SOLN
1.0000 mg | Freq: Once | INTRAMUSCULAR | Status: AC
  Administered 2012-03-18: 1 mg via INTRAVENOUS
  Filled 2012-03-18: qty 1

## 2012-03-18 NOTE — ED Notes (Signed)
Pt reports "getting aggravated" with the care here tonight and would like to leave.  Risks of leaving explained to the pt and pt is willing to stay a little longer to be seen by a provider.

## 2012-03-18 NOTE — ED Notes (Signed)
Right lower quadrant pain,  Vaginal swelling,  Blue toes and finger and nose.  vomiting

## 2012-03-18 NOTE — ED Provider Notes (Addendum)
History     CSN: [id number]  Arrival date & time 03/18/12  0030   First MD Initiated Contact with Patient 03/18/12 0403      Chief Complaint  Patient presents with  . Nausea  . Emesis  . Groin Swelling    vaginal swelling    (Consider location/radiation/quality/duration/timing/severity/associated sxs/prior treatment) HPI Complaint of right lower quadrant pain onset 9 PM yesterday, nonradiating accompanied by nausea and approximately 6 episodes of vomiting since onset of pain. Patient also complains of swelling of my private parts" for the past 10 days. Patient also reports that her toes were blue earlier today which has since resolved. She denies blueness of her nose or fingers. She denies shortness of breath denies other. No other associated symptoms. Past Medical History  Diagnosis Date  . PE (pulmonary embolism)   . Anxiety     History reviewed. No pertinent past surgical history.  History reviewed. No pertinent family history.  History  Substance Use Topics  . Smoking status: Current Every Day Smoker -- 1.00 packs/day    Types: Cigarettes  . Smokeless tobacco: Not on file  . Alcohol Use: No    OB History   Grav Para Term Preterm Abortions TAB SAB Ect Mult Living                  Review of Systems  Constitutional: Negative.   HENT: Negative.   Respiratory: Negative.   Cardiovascular: Negative.   Gastrointestinal: Positive for nausea, vomiting and abdominal pain.  Genitourinary:       Currently on menses, genital swelling  Musculoskeletal: Negative.   Skin: Positive for color change.  Neurological: Negative.   Psychiatric/Behavioral: Negative.   All other systems reviewed and are negative.    Allergies  Amoxicillin; Aspirin; Codeine; and Penicillins  Home Medications   Current Outpatient Rx  Name  Route  Sig  Dispense  Refill  . ALPRAZolam (XANAX) 1 MG tablet   Oral   Take 1 mg by mouth 2 (two) times daily.         Marland Kitchen PARoxetine (PAXIL) 40 MG  tablet   Oral   Take 40 mg by mouth every morning.           BP 148/87  Pulse 119  Temp(Src) 99.2 F (37.3 C) (Oral)  Resp 22  SpO2 96%  LMP 03/15/2012  Physical Exam  Nursing note and vitals reviewed. Constitutional: She appears well-developed and well-nourished.  HENT:  Head: Normocephalic and atraumatic.  Eyes: Conjunctivae are normal. Pupils are equal, round, and reactive to light.  Neck: Neck supple. No tracheal deviation present. No thyromegaly present.  Cardiovascular: Normal rate and regular rhythm.   No murmur heard. Pulmonary/Chest: Effort normal and breath sounds normal.  Abdominal: Soft. Bowel sounds are normal. She exhibits no distension and no mass. There is tenderness. There is no guarding.  Morbidly obese. Tender right lower quadrant  Genitourinary:  Normal external genitalia. Slight amount brownish blood in vault. Cervical os closed. No cervical motion tenderness no adnexal tenderness or masses  Musculoskeletal: Normal range of motion. She exhibits no edema and no tenderness.  Neurological: She is alert. Coordination normal.  Skin: Skin is warm and dry. No rash noted.  Psychiatric: She has a normal mood and affect.    ED Course  Procedures (including critical care time)  Labs Reviewed  CBC WITH DIFFERENTIAL - Abnormal; Notable for the following:    WBC 16.2 (*)    Hemoglobin 15.8 (*)  HCT 46.6 (*)    Neutro Abs 11.6 (*)    All other components within normal limits  COMPREHENSIVE METABOLIC PANEL - Abnormal; Notable for the following:    Glucose, Bld 239 (*)    Total Bilirubin 0.1 (*)    GFR calc non Af Amer 65 (*)    GFR calc Af Amer 75 (*)    All other components within normal limits  GC/CHLAMYDIA PROBE AMP  WET PREP, GENITAL  LIPASE, BLOOD  URINALYSIS, ROUTINE W REFLEX MICROSCOPIC   No results found.  Results for orders placed during the hospital encounter of 03/18/12  WET PREP, GENITAL      Result Value Range   Yeast Wet Prep HPF POC  RARE (*) NONE SEEN   Trich, Wet Prep NONE SEEN  NONE SEEN   Clue Cells Wet Prep HPF POC NONE SEEN  NONE SEEN   WBC, Wet Prep HPF POC MANY (*) NONE SEEN  URINALYSIS, ROUTINE W REFLEX MICROSCOPIC      Result Value Range   Color, Urine YELLOW  YELLOW   APPearance CLOUDY (*) CLEAR   Specific Gravity, Urine 1.030  1.005 - 1.030   pH 5.0  5.0 - 8.0   Glucose, UA >1000 (*) NEGATIVE mg/dL   Hgb urine dipstick LARGE (*) NEGATIVE   Bilirubin Urine NEGATIVE  NEGATIVE   Ketones, ur NEGATIVE  NEGATIVE mg/dL   Protein, ur 30 (*) NEGATIVE mg/dL   Urobilinogen, UA 0.2  0.0 - 1.0 mg/dL   Nitrite NEGATIVE  NEGATIVE   Leukocytes, UA SMALL (*) NEGATIVE  CBC WITH DIFFERENTIAL      Result Value Range   WBC 16.2 (*) 4.0 - 10.5 K/uL   RBC 4.85  3.87 - 5.11 MIL/uL   Hemoglobin 15.8 (*) 12.0 - 15.0 g/dL   HCT 11.9 (*) 14.7 - 82.9 %   MCV 96.1  78.0 - 100.0 fL   MCH 32.6  26.0 - 34.0 pg   MCHC 33.9  30.0 - 36.0 g/dL   RDW 56.2  13.0 - 86.5 %   Platelets 236  150 - 400 K/uL   Neutrophils Relative 71  43 - 77 %   Neutro Abs 11.6 (*) 1.7 - 7.7 K/uL   Lymphocytes Relative 22  12 - 46 %   Lymphs Abs 3.6  0.7 - 4.0 K/uL   Monocytes Relative 5  3 - 12 %   Monocytes Absolute 0.8  0.1 - 1.0 K/uL   Eosinophils Relative 1  0 - 5 %   Eosinophils Absolute 0.2  0.0 - 0.7 K/uL   Basophils Relative 0  0 - 1 %   Basophils Absolute 0.0  0.0 - 0.1 K/uL  COMPREHENSIVE METABOLIC PANEL      Result Value Range   Sodium 138  135 - 145 mEq/L   Potassium 3.9  3.5 - 5.1 mEq/L   Chloride 104  96 - 112 mEq/L   CO2 19  19 - 32 mEq/L   Glucose, Bld 239 (*) 70 - 99 mg/dL   BUN 23  6 - 23 mg/dL   Creatinine, Ser 7.84  0.50 - 1.10 mg/dL   Calcium 9.0  8.4 - 69.6 mg/dL   Total Protein 7.6  6.0 - 8.3 g/dL   Albumin 3.7  3.5 - 5.2 g/dL   AST 14  0 - 37 U/L   ALT 18  0 - 35 U/L   Alkaline Phosphatase 78  39 - 117 U/L   Total Bilirubin 0.1 (*)  0.3 - 1.2 mg/dL   GFR calc non Af Amer 65 (*) >90 mL/min   GFR calc Af Amer 75 (*)  >90 mL/min  LIPASE, BLOOD      Result Value Range   Lipase 34  11 - 59 U/L  URINE MICROSCOPIC-ADD ON      Result Value Range   Squamous Epithelial / LPF RARE  RARE   WBC, UA 3-6  <3 WBC/hpf   RBC / HPF 11-20  <3 RBC/hpf   Bacteria, UA RARE  RARE  POCT PREGNANCY, URINE      Result Value Range   Preg Test, Ur NEGATIVE  NEGATIVE   Ct Abdomen Pelvis W Contrast  03/18/2012  *RADIOLOGY REPORT*  Clinical Data: Right lower quadrant pain  CT ABDOMEN AND PELVIS WITH CONTRAST  Technique:  Multidetector CT imaging of the abdomen and pelvis was performed following the standard protocol during bolus administration of intravenous contrast.  Contrast: OMNIPAQUE IOHEXOL 300 MG/ML  SOLN  Comparison: 08/29/2010, 02/24/2008 MRI, 10/27/2007 MRI.  Findings: Limited images through the lung bases demonstrate no significant appreciable abnormality. The heart size is within normal limits. No pleural or pericardial effusion.  Hepatic steatosis is suggested.  Subcentimeter hypodensity within segment eight is unchanged, favored to reflect a cyst. Unremarkable biliary system, spleen, pancreas, right adrenal gland. Left adrenal mass measures 4.2 cm and is heterogeneous in attenuation.  Mildly lobular renal contours.  No hydronephrosis or hydroureter.  Colonic diverticulosis.  No CT evidence for colitis or diverticulitis.  Normal appendix.  Small bowel loops are of normal course and caliber.  No free intraperitoneal air or fluid.  No lymphadenopathy.  There is scattered atherosclerotic calcification of the aorta and its branches. No aneurysmal dilatation.  Partially decompressed bladder.  Lobular uterine contour. Nonspecific right ovarian / para ovarian cysts and tubular cystic structure within the left adnexa, favored to reflect hydrosalpinx (coronal images 75 - 83).  No acute osseous finding.  IMPRESSION: Tubular cystic structure within the left adnexa may reflect hydrosalpinx or a cluster of paraovarian cysts.  Correlate  with pelvic examination and pelvic ultrasound if warranted.  Normal appendix.  Hepatic steatosis.  4.2 cm left adrenal adenoma has slowly grown since 2009 and is heterogeneous in attenuation which may reflect areas of enhancement or hemorrhage.   Original Report Authenticated By: Jearld Lesch, M.D.     No diagnosis found.  7:15 AM pain and nausea under control after treatment with intravenous by mouth stat and the medics. Councilled pt for 5 minutes on smoking cessation MDM  I don't feel that patient emergently needs a pelvic ultrasound as she has no adnexal tenderness or masses. Her symptoms are right-sided. I discussed the CT scan with radiologist. Patient should be referred back to her primary care physician for possible referral to an endocrinologist regarding adrenal adenoma. Hyperglycemia is new. Patient is likely diabetic. Plan prescriptions Xanax, Paxil.[email or web address],zofran Followup Dr. Ronne Binning  this week. Diagnosis #1 abdominal pain, nonspecific #2 hyperglycemia #3 hyper-salpinx  #4 adrenal adenoma #5 tobacco abuse #6 elevated blood pressure          Doug Sou, MD 03/18/12 0740  Doug Sou, MD 03/18/12 [phone]

## 2012-03-18 NOTE — ED Notes (Signed)
Patient transported to CT 

## 2012-03-18 NOTE — ED Notes (Signed)
Attempted to obtain a urine sample via in and out cath d/t pt being on menstrual cycle.  During procedure, pt asked writer to stop.

## 2012-03-19 LAB — GC/CHLAMYDIA PROBE AMP: GC Probe RNA: NEGATIVE

## 2012-05-17 ENCOUNTER — Emergency Department (HOSPITAL_COMMUNITY): Payer: BC Managed Care – PPO

## 2012-05-17 ENCOUNTER — Encounter (HOSPITAL_COMMUNITY): Payer: Self-pay | Admitting: Emergency Medicine

## 2012-05-17 ENCOUNTER — Emergency Department (HOSPITAL_COMMUNITY)
Admission: EM | Admit: 2012-05-17 | Discharge: 2012-05-17 | Disposition: A | Discharge status: Home / Self Care | Payer: BC Managed Care – PPO | Attending: Emergency Medicine | Admitting: Emergency Medicine

## 2012-05-17 DIAGNOSIS — R42 Dizziness and giddiness: Secondary | ICD-10-CM | POA: Insufficient documentation

## 2012-05-17 DIAGNOSIS — R0602 Shortness of breath: Secondary | ICD-10-CM | POA: Insufficient documentation

## 2012-05-17 DIAGNOSIS — H53149 Visual discomfort, unspecified: Secondary | ICD-10-CM | POA: Insufficient documentation

## 2012-05-17 DIAGNOSIS — F411 Generalized anxiety disorder: Secondary | ICD-10-CM | POA: Insufficient documentation

## 2012-05-17 DIAGNOSIS — R000 Tachycardia, unspecified: Secondary | ICD-10-CM | POA: Insufficient documentation

## 2012-05-17 DIAGNOSIS — F17200 Nicotine dependence, unspecified, uncomplicated: Secondary | ICD-10-CM | POA: Insufficient documentation

## 2012-05-17 DIAGNOSIS — Z79899 Other long term (current) drug therapy: Secondary | ICD-10-CM | POA: Insufficient documentation

## 2012-05-17 DIAGNOSIS — Z8679 Personal history of other diseases of the circulatory system: Secondary | ICD-10-CM | POA: Insufficient documentation

## 2012-05-17 DIAGNOSIS — G43909 Migraine, unspecified, not intractable, without status migrainosus: Secondary | ICD-10-CM

## 2012-05-17 DIAGNOSIS — H539 Unspecified visual disturbance: Secondary | ICD-10-CM | POA: Insufficient documentation

## 2012-05-17 DIAGNOSIS — Z880 Allergy status to penicillin: Secondary | ICD-10-CM | POA: Insufficient documentation

## 2012-05-17 DIAGNOSIS — R112 Nausea with vomiting, unspecified: Secondary | ICD-10-CM | POA: Insufficient documentation

## 2012-05-17 LAB — URINALYSIS, ROUTINE W REFLEX MICROSCOPIC
Bilirubin Urine: NEGATIVE
Hgb urine dipstick: NEGATIVE
Nitrite: NEGATIVE
Protein, ur: 30 mg/dL — AB
Specific Gravity, Urine: 1.031 — ABNORMAL HIGH (ref 1.005–1.030)
Urobilinogen, UA: 0.2 mg/dL (ref 0.0–1.0)

## 2012-05-17 LAB — URINE MICROSCOPIC-ADD ON

## 2012-05-17 LAB — D-DIMER, QUANTITATIVE: D-Dimer, Quant: 0.96 ug/mL-FEU — ABNORMAL HIGH (ref 0.00–0.48)

## 2012-05-17 LAB — POCT I-STAT, CHEM 8
Calcium, Ion: 1.14 mmol/L (ref 1.12–1.23)
Glucose, Bld: 235 mg/dL — ABNORMAL HIGH (ref 70–99)
HCT: 52 % — ABNORMAL HIGH (ref 36.0–46.0)
Hemoglobin: 17.7 g/dL — ABNORMAL HIGH (ref 12.0–15.0)

## 2012-05-17 LAB — POCT I-STAT TROPONIN I: Troponin i, poc: 0 ng/mL (ref 0.00–0.08)

## 2012-05-17 LAB — CBC
MCH: 32.7 pg (ref 26.0–34.0)
MCV: 94.4 fL (ref 78.0–100.0)
Platelets: 231 10*3/uL (ref 150–400)
RDW: 13.6 % (ref 11.5–15.5)
WBC: 12.1 10*3/uL — ABNORMAL HIGH (ref 4.0–10.5)

## 2012-05-17 MED ORDER — KETOROLAC TROMETHAMINE 30 MG/ML IJ SOLN
30.0000 mg | Freq: Once | INTRAMUSCULAR | Status: AC
  Administered 2012-05-17: 30 mg via INTRAVENOUS
  Filled 2012-05-17: qty 1

## 2012-05-17 MED ORDER — METOCLOPRAMIDE HCL 5 MG/ML IJ SOLN
10.0000 mg | Freq: Once | INTRAMUSCULAR | Status: AC
  Administered 2012-05-17: 10 mg via INTRAVENOUS
  Filled 2012-05-17: qty 2

## 2012-05-17 MED ORDER — ALBUTEROL SULFATE (5 MG/ML) 0.5% IN NEBU
5.0000 mg | INHALATION_SOLUTION | Freq: Once | RESPIRATORY_TRACT | Status: AC
  Administered 2012-05-17: 5 mg via RESPIRATORY_TRACT
  Filled 2012-05-17: qty 1

## 2012-05-17 MED ORDER — SODIUM CHLORIDE 0.9 % IV BOLUS (SEPSIS)
1000.0000 mL | Freq: Once | INTRAVENOUS | Status: AC
  Administered 2012-05-17: 1000 mL via INTRAVENOUS

## 2012-05-17 MED ORDER — IOHEXOL 350 MG/ML SOLN
100.0000 mL | Freq: Once | INTRAVENOUS | Status: AC | PRN
  Administered 2012-05-17: 100 mL via INTRAVENOUS

## 2012-05-17 MED ORDER — DEXAMETHASONE SODIUM PHOSPHATE 10 MG/ML IJ SOLN
10.0000 mg | Freq: Once | INTRAMUSCULAR | Status: AC
  Administered 2012-05-17: 10 mg via INTRAVENOUS
  Filled 2012-05-17: qty 1

## 2012-05-17 MED ORDER — DIPHENHYDRAMINE HCL 50 MG/ML IJ SOLN
25.0000 mg | Freq: Once | INTRAMUSCULAR | Status: AC
  Administered 2012-05-17: 25 mg via INTRAVENOUS
  Filled 2012-05-17: qty 1

## 2012-05-17 NOTE — ED Notes (Signed)
RT called for neb treatment

## 2012-05-17 NOTE — ED Provider Notes (Signed)
Medical screening examination/treatment/procedure(s) were performed by non-physician practitioner and as supervising physician I was immediately available for consultation/collaboration.   Loren Racer, MD 05/17/12 2220

## 2012-05-17 NOTE — ED Provider Notes (Signed)
History     CSN: [id number]  Arrival date & time 05/17/12  1747   First MD Initiated Contact with Patient 05/17/12 1750      Chief Complaint  Patient presents with  . Headache  . Dizziness    (Consider location/radiation/quality/duration/timing/severity/associated sxs/prior treatment) HPI Comments: 47 y/o female with a PMHx of migraines, anxiety and PE presents to the ED complaining of gradual onset headache x 4 days. Describes headache as throbbing, left fronto-temporal, rated 7/10 unrelieved by tylenol PM or ice to her head. States this is where her typical migraines are located, however normally she has nausea, vomiting and photophobia which are not present. Admits to blurred vision, dizziness and spots in field of vision which is typical for her migraines. Last migraine was about one year ago. Denies fever, chills, confusion, recent illness. Also complaining of shortness of breath on exertion. Admits to being a 1 ppd smoker. No chest pain.   Patient is a 47 y.o. female presenting with headaches. The history is provided by the patient and the spouse.  Headache Associated symptoms: dizziness   Associated symptoms: no cough, no fever, no neck pain, no neck stiffness and no photophobia     Past Medical History  Diagnosis Date  . PE (pulmonary embolism)   . Anxiety     History reviewed. No pertinent past surgical history.  No family history on file.  History  Substance Use Topics  . Smoking status: Current Every Day Smoker -- 1.00 packs/day    Types: Cigarettes  . Smokeless tobacco: Not on file  . Alcohol Use: No    OB History   Grav Para Term Preterm Abortions TAB SAB Ect Mult Living                  Review of Systems  Constitutional: Negative for fever and chills.  HENT: Negative for neck pain and neck stiffness.   Eyes: Positive for visual disturbance. Negative for photophobia.  Respiratory: Positive for shortness of breath. Negative for cough and wheezing.    Cardiovascular: Negative for chest pain.  Genitourinary: Negative for dysuria, urgency, frequency, vaginal bleeding, vaginal discharge and menstrual problem.  Neurological: Positive for dizziness and headaches. Negative for syncope.  Psychiatric/Behavioral: Negative for confusion.  All other systems reviewed and are negative.    Allergies  Amoxicillin; Aspirin; Codeine; and Penicillins  Home Medications   Current Outpatient Rx  Name  Route  Sig  Dispense  Refill  . ALPRAZolam (XANAX) 1 MG tablet   Oral   Take 1 tablet (1 mg total) by mouth 2 (two) times daily.   10 tablet   0   . HYDROcodone-acetaminophen (NORCO/VICODIN) 5-325 MG per tablet   Oral   Take 1 tablet by mouth every 4 (four) hours as needed for pain.   12 tablet   0   . ondansetron (ZOFRAN) 4 MG tablet   Oral   Take 2 tablets (8 mg total) by mouth every 8 (eight) hours as needed for nausea.   12 tablet   0   . PARoxetine (PAXIL) 20 MG tablet   Oral   Take 2 tablets (40 mg total) by mouth every morning.   10 tablet   0     BP 136/87  Pulse 100  Temp(Src) 99 F (37.2 C) (Oral)  Resp 18  SpO2 99%  Physical Exam  Nursing note and vitals reviewed. Constitutional: She is oriented to person, place, and time. She appears well-developed. No distress.  Obese  HENT:  Head: Normocephalic and atraumatic.  Mouth/Throat: Oropharynx is clear and moist.  Eyes: Conjunctivae and EOM are normal. Pupils are equal, round, and reactive to light.  Neck: Normal range of motion. Neck supple.  Cardiovascular: Regular rhythm, normal heart sounds and intact distal pulses.  Tachycardia present.   Pulmonary/Chest: Effort normal. No respiratory distress. She has no decreased breath sounds. She has wheezes (scattered). She has no rhonchi. She has no rales.  Abdominal: Soft. Bowel sounds are normal. There is no tenderness.  Musculoskeletal: Normal range of motion. She exhibits no edema.  Neurological: She is alert and  oriented to person, place, and time. No cranial nerve deficit or sensory deficit. She displays a negative Romberg sign. Coordination and gait normal.  Skin: Skin is warm and dry. She is not diaphoretic.  Psychiatric: She has a normal mood and affect. Her behavior is normal.    ED Course  Procedures (including critical care time)  Labs Reviewed  CBC - Abnormal; Notable for the following:    WBC 12.1 (*)    Hemoglobin 16.5 (*)    HCT 47.6 (*)    All other components within normal limits  POCT I-STAT, CHEM 8 - Abnormal; Notable for the following:    Creatinine, Ser 1.20 (*)    Glucose, Bld 235 (*)    Hemoglobin 17.7 (*)    HCT 52.0 (*)    All other components within normal limits  URINALYSIS, ROUTINE W REFLEX MICROSCOPIC  D-DIMER, QUANTITATIVE   Dg Chest 2 View  05/17/2012  *RADIOLOGY REPORT*  Clinical Data: 4 days of dizziness, headaches, cough, shortness of breath with walking, past history of smoking and pulmonary embolism  CHEST - 2 VIEW  Comparison: 03/07/2011  Findings: Normal heart size, mediastinal contours, and pulmonary vascularity. Minimal chronic peribronchial thickening and accentuation of perihilar markings unchanged since previous exam. No acute infiltrate, pleural effusion or pneumothorax. No acute osseous findings.  IMPRESSION: No acute abnormalities. Minimal chronic bronchitic changes.   Original Report Authenticated By: Ulyses Southward, M.D.    Ct Angio Chest Pe W/cm &/or Wo Cm  05/17/2012  *RADIOLOGY REPORT*  Clinical Data:  Shortness of breath, dizziness, shortness of breath with exertion, smoker, history pulmonary embolism  CT ANGIOGRAPHY CHEST  Technique:  Multidetector CT imaging of the chest using the standard protocol during bolus administration of intravenous contrast. Multiplanar reconstructed images including MIPs were obtained and reviewed to evaluate the vascular anatomy. Initial images were nondiagnostic and the patient was reinjected and reimaged.  Contrast: 145 ml  of Omnipaque-350 IV total administered contrast  Comparison: 03/07/2011  Findings: Aorta normal caliber without aneurysm or dissection. Scattered respiratory motion artifacts. Pulmonary arteries appear grossly patent. No evidence of pulmonary embolism. Visualized liver unremarkable. No thoracic adenopathy, with scattered normal-sized nodes noted.  Emphysematous changes at apices. Dependent atelectasis left lower lobe. No definite infiltrate, pleural effusion or pneumothorax. Calcified granuloma left lower lobe. Thoracic scoliosis without acute osseous findings.  IMPRESSION: No evidence of pulmonary embolism. Minimal atelectasis left lower lobe.   Original Report Authenticated By: Ulyses Southward, M.D.     Date: 05/17/2012  Rate: 101  Rhythm: sinus tachycardia and premature atrial contractions (PAC)  QRS Axis: normal  Intervals: normal  ST/T Wave abnormalities: normal  Conduction Disutrbances:none  Narrative Interpretation: no stemi, no sig changes since prior EKG on 03/07/2011  Old EKG Reviewed: unchanged    1. Migraine   2. Shortness of breath       MDM  47 y/o female with migraine,  sob on exertion. No red flags concerning patient's headache. Afebrile, NAD, neuro exam unremarkable. Will treat migraine with fluids, toradol, decadron, benadryl and reglan. Regarding SOB on exertion, patient has hx of PE, chronic smoker. Will check cbc, troponin, chem 8, d dimer, give breathing treatment, EKG and CXR. 7:31 PM Headache improving with above medication regimen. SOB decreased with breathing treatment, wheezing clinically improved. D-dimer elevated, obtaining CT angio to r/o PE. Troponin negative. 10:13 PM CT angio shows no evidence of PE. Patient sitting in exam room in NAD. Headache still controlled, sob has not returned. I highly suggested smoking cessation. She has f/u with her PCP next week. Return precautions discussed. Patient states understanding of plan and is agreeable.     Trevor Mace,  PA-C 05/17/12 2214

## 2012-05-17 NOTE — ED Notes (Signed)
CT aware pt has 20G IV

## 2012-05-17 NOTE — ED Notes (Signed)
Pt c/o headache for past 4 days. Pt states she has a hx of migraines. Pt states she has not had n/v this time. States today she got dizzy. Reports feeling dizzy all the time. Also c/o shortness of breath with exertion. Pt admits to being current smoker. Pt states she has used ice on her head and Tylenol PM, but nothing is helping with the pain. Pt denies pain elsewhere. Pt a/o x 3. Skin warm/dry. Pt ambulatory to exam room with shortness of breath after walking. Pt arrives with family member.

## 2012-06-26 ENCOUNTER — Encounter: Payer: BC Managed Care – PPO | Attending: Endocrinology

## 2012-07-15 ENCOUNTER — Emergency Department (HOSPITAL_COMMUNITY)
Admission: EM | Admit: 2012-07-15 | Discharge: 2012-07-15 | Disposition: A | Discharge status: Home / Self Care | Payer: BC Managed Care – PPO | Attending: Emergency Medicine | Admitting: Emergency Medicine

## 2012-07-15 ENCOUNTER — Emergency Department (HOSPITAL_COMMUNITY): Payer: BC Managed Care – PPO

## 2012-07-15 ENCOUNTER — Encounter (HOSPITAL_COMMUNITY): Payer: Self-pay | Admitting: *Deleted

## 2012-07-15 DIAGNOSIS — Z79899 Other long term (current) drug therapy: Secondary | ICD-10-CM | POA: Insufficient documentation

## 2012-07-15 DIAGNOSIS — Z880 Allergy status to penicillin: Secondary | ICD-10-CM | POA: Insufficient documentation

## 2012-07-15 DIAGNOSIS — J40 Bronchitis, not specified as acute or chronic: Secondary | ICD-10-CM

## 2012-07-15 DIAGNOSIS — F17200 Nicotine dependence, unspecified, uncomplicated: Secondary | ICD-10-CM | POA: Insufficient documentation

## 2012-07-15 DIAGNOSIS — F411 Generalized anxiety disorder: Secondary | ICD-10-CM | POA: Insufficient documentation

## 2012-07-15 DIAGNOSIS — R7309 Other abnormal glucose: Secondary | ICD-10-CM | POA: Insufficient documentation

## 2012-07-15 DIAGNOSIS — E1169 Type 2 diabetes mellitus with other specified complication: Secondary | ICD-10-CM | POA: Insufficient documentation

## 2012-07-15 DIAGNOSIS — R739 Hyperglycemia, unspecified: Secondary | ICD-10-CM

## 2012-07-15 DIAGNOSIS — Z8709 Personal history of other diseases of the respiratory system: Secondary | ICD-10-CM | POA: Insufficient documentation

## 2012-07-15 HISTORY — DX: Type 2 diabetes mellitus without complications: E11.9

## 2012-07-15 LAB — BASIC METABOLIC PANEL
BUN: 12 mg/dL (ref 6–23)
CO2: 21 mEq/L (ref 19–32)
Chloride: 102 mEq/L (ref 96–112)
Glucose, Bld: 226 mg/dL — ABNORMAL HIGH (ref 70–99)
Potassium: 3.7 mEq/L (ref 3.5–5.1)
Sodium: 136 mEq/L (ref 135–145)

## 2012-07-15 MED ORDER — IOHEXOL 350 MG/ML SOLN
100.0000 mL | Freq: Once | INTRAVENOUS | Status: AC | PRN
  Administered 2012-07-15: 100 mL via INTRAVENOUS

## 2012-07-15 MED ORDER — OPTICHAMBER ADVANTAGE MISC
1.0000 | Freq: Once | Status: AC
  Administered 2012-07-15: 1
  Filled 2012-07-15: qty 1

## 2012-07-15 MED ORDER — PREDNISONE 20 MG PO TABS: 40.0000 mg | ORAL_TABLET | Freq: Every day | ORAL | Status: DC

## 2012-07-15 MED ORDER — ALBUTEROL SULFATE HFA 108 (90 BASE) MCG/ACT IN AERS
2.0000 | INHALATION_SPRAY | RESPIRATORY_TRACT | Status: DC | PRN
  Administered 2012-07-15: 2 via RESPIRATORY_TRACT
  Filled 2012-07-15: qty 6.7

## 2012-07-15 MED ORDER — IPRATROPIUM BROMIDE 0.02 % IN SOLN
0.5000 mg | Freq: Once | RESPIRATORY_TRACT | Status: AC
  Administered 2012-07-15: 0.5 mg via RESPIRATORY_TRACT
  Filled 2012-07-15: qty 2.5

## 2012-07-15 MED ORDER — SODIUM CHLORIDE 0.9 % IV BOLUS (SEPSIS)
500.0000 mL | Freq: Once | INTRAVENOUS | Status: AC
  Administered 2012-07-15: 500 mL via INTRAVENOUS

## 2012-07-15 MED ORDER — ALBUTEROL SULFATE (5 MG/ML) 0.5% IN NEBU
5.0000 mg | INHALATION_SOLUTION | Freq: Once | RESPIRATORY_TRACT | Status: AC
  Administered 2012-07-15: 5 mg via RESPIRATORY_TRACT
  Filled 2012-07-15: qty 1

## 2012-07-15 MED ORDER — IBUPROFEN 200 MG PO TABS
600.0000 mg | ORAL_TABLET | Freq: Once | ORAL | Status: AC
  Administered 2012-07-15: 600 mg via ORAL
  Filled 2012-07-15: qty 3

## 2012-07-15 MED ORDER — INSULIN ASPART 100 UNIT/ML ~~LOC~~ SOLN
5.0000 [IU] | Freq: Once | SUBCUTANEOUS | Status: AC
  Administered 2012-07-15: 5 [IU] via INTRAVENOUS
  Filled 2012-07-15: qty 1

## 2012-07-15 MED ORDER — PREDNISONE 20 MG PO TABS
40.0000 mg | ORAL_TABLET | Freq: Once | ORAL | Status: AC
  Administered 2012-07-15: 40 mg via ORAL
  Filled 2012-07-15: qty 2

## 2012-07-15 NOTE — ED Notes (Signed)
Pt c/o cold/cough for a few days; shortness of breath; states feels like has pulled something in her back from coughing; sat 97%; c/o chest pain from coughing

## 2012-07-15 NOTE — ED Notes (Signed)
Patient is alert and oriented x3.  She is complaining of shortness of breath that started 2 days ago.  Patient has a history of this issue.  She adds that is a smoker and feeling congestion in her chest

## 2012-07-15 NOTE — ED Notes (Signed)
Patient is alert and oriented x3.  She was given DC instructions and follow up visit instructions.  Patient gave verbal understanding. She was DC ambulatory under her own power to home.  V/S stable.  He was not showing any signs of distress on DC 

## 2012-07-15 NOTE — ED Provider Notes (Signed)
History    CSN: [id number] Arrival date & time 07/15/12  0036  First MD Initiated Contact with Patient 07/15/12 0139     Chief Complaint  Patient presents with  . Shortness of Breath   HPI [name] is a 47 y.o. female presenting with shortness of breath. She's had associated cold symptoms with congestion, she also feels like she's got some left back pain which she feels "I pulled something" from coughing frequently. Patient is a pack-a-day smoker. She also has prior history of pulmonary embolism but denies hemoptysis, fever, chills.  Cough has been occasionally productive of yellow sputum, no sore throat, no difficulty swallowing no abdominal pain, no chest pain, dysuria.  Past Medical History  Diagnosis Date  . PE (pulmonary embolism)   . Anxiety   . Diabetes mellitus without complication    History reviewed. No pertinent past surgical history. No family history on file. History  Substance Use Topics  . Smoking status: Current Every Day Smoker -- 1.00 packs/day    Types: Cigarettes  . Smokeless tobacco: Not on file  . Alcohol Use: No   OB History   Grav Para Term Preterm Abortions TAB SAB Ect Mult Living                 Review of Systems At least 10pt or greater review of systems completed and are negative except where specified in the HPI.  Allergies  Amoxicillin; Aspirin; Codeine; and Penicillins  Home Medications   Current Outpatient Rx  Name  Route  Sig  Dispense  Refill  . ALPRAZolam (XANAX) 1 MG tablet   Oral   Take 1 tablet (1 mg total) by mouth 2 (two) times daily.   10 tablet   0   . metFORMIN (GLUCOPHAGE-XR) 500 MG 24 hr tablet   Oral   Take 500 mg by mouth daily with breakfast.         . PARoxetine (PAXIL) 20 MG tablet   Oral   Take 2 tablets (40 mg total) by mouth every morning.   10 tablet   0    BP 145/106  Pulse 104  Temp(Src) 98.8 F (37.1 C)  Resp 18  Wt 255 lb (115.667 kg)  SpO2 97%  LMP 07/08/2012 Physical  Exam  Nursing notes reviewed.  Electronic medical record reviewed. VITAL SIGNS:   Filed Vitals:   07/15/12 0045 07/15/12 0054 07/15/12 0102 07/15/12 0505  BP: 162/92 145/106  142/84  Pulse: 115 106 104 106  Temp: 98.8 F (37.1 C)     Resp: 28  18 17   Weight: 255 lb (115.667 kg)     SpO2: 97% 98% 97% 96%   CONSTITUTIONAL: Awake, oriented, appears non-toxic, smells of cigarettes HENT: Atraumatic, normocephalic, oral mucosa pink and moist, airway patent. Nares patent without drainage. External ears normal. EYES: Conjunctiva clear, EOMI, PERRLA NECK: Trachea midline, non-tender, supple CARDIOVASCULAR: Normal heart rate, Normal rhythm, No murmurs, rubs, gallops PULMONARY/CHEST: Occasional wheezes bilaterally. Symmetrical breath sounds. Non-tender. ABDOMINAL: Non-distended, soft, non-tender - no rebound or guarding.  BS normal. NEUROLOGIC: Non-focal, moving all four extremities, no gross sensory or motor deficits. EXTREMITIES: No clubbing, cyanosis, or edema SKIN: Warm, Dry, No erythema, No rash  ED Course  Procedures (including critical care time)  Date: 07/15/2012  Rate: 109  Rhythm: normal sinus rhythm  QRS Axis: normal  Intervals: normal  ST/T Wave abnormalities: normal  Conduction Disutrbances: none  Narrative Interpretation: Sinus tachycardia prior EKG     Labs Reviewed  D-DIMER, QUANTITATIVE - Abnormal; Notable for the following:    D-Dimer, Quant 0.67 (*)    All other components within normal limits  BASIC METABOLIC PANEL - Abnormal; Notable for the following:    Glucose, Bld 226 (*)    GFR calc non Af Amer 66 (*)    GFR calc Af Amer 76 (*)    All other components within normal limits   Dg Chest 2 View  07/15/2012   *RADIOLOGY REPORT*  Clinical Data: Cold symptoms and cough for 2 days.  Increasing shortness of breath today.  CHEST - 2 VIEW  Comparison: 05/17/2012  Findings: Peribronchial thickening and interstitial changes consistent with chronic bronchitis. The  heart size and pulmonary vascularity are normal. The lungs appear clear and expanded without focal air space disease or consolidation. No blunting of the costophrenic angles.  No pneumothorax.  Mediastinal contours appear intact.  Mild hyperinflation.  No significant change since previous study.  IMPRESSION: Emphysematous and chronic bronchitic changes in the lungs.  No evidence of active pulmonary disease.   Original Report Authenticated By: Burman Nieves, M.D.   Ct Angio Chest Pe W/cm &/or Wo Cm  07/15/2012   *RADIOLOGY REPORT*  Clinical Data: Chest congestion.  Shortness of breath.  Cold symptoms for 2 days.  Elevated D-dimer.  Previous history of pulmonary embolus.  CT ANGIOGRAPHY CHEST  Technique:  Multidetector CT imaging of the chest using the standard protocol during bolus administration of intravenous contrast. Multiplanar reconstructed images including MIPs were obtained and reviewed to evaluate the vascular anatomy.  Contrast: OMNIPAQUE IOHEXOL 350 MG/ML SOLN  Comparison: 05/17/2012  Findings: Technically adequate study with moderately good opacification of the central and segmental pulmonary arteries.  No focal filling defects are demonstrated.  No evidence of significant pulmonary embolus.  Normal heart size.  Normal caliber thoracic aorta.  No evidence of aortic dissection.  Mediastinal lymph nodes are not pathologically enlarged.  Esophagus is decompressed.  No pleural effusions.  There is a left adrenal gland nodule measuring 3.6 cm in diameter. Density measurements are consistent with fat and this likely represents a benign adenoma.  Scattered emphysematous changes in the lungs.  No airspace disease or interstitial infiltration.  Airways appear patent.  No pneumothorax.  Normal alignment of the thoracic spine.  IMPRESSION: The no evidence of significant pulmonary embolus.  Left adrenal gland adenoma.  No active pulmonary disease.  Emphysematous changes in the lungs.   Original Report  Authenticated By: Burman Nieves, M.D.   1. Bronchitis   2. Hyperglycemia     MDM  [name] is a 47 y.o. female presents with shortness of breath likely bronchitis/COPD exacerbation, she is tachycardic, tachypneic, wheezing on initial exam. Patient Wells score is 4.5, she is in moderate risk for pulmonary embolism and has had PE in the past-d-dimer was positive so CT angiogram was obtained showing no acute pulmonary embolism. Patient will be treated symptomatically for COPD exacerbation/chronic bronchitis. Do not feel this patient's symptoms are consistent with acute coronary syndrome, they're more consistent with respiratory viral infection, and bronchitis.  I explained the diagnosis and have given explicit precautions to return to the ER including chest pain or any other new or worsening symptoms. The patient understands and accepts the medical plan as it's been dictated and I have answered their questions. Discharge instructions concerning home care and prescriptions have been given.  The patient is STABLE and is discharged to home in good condition.    Jones Skene, MD 07/16/12 [phone]

## 2012-10-24 ENCOUNTER — Encounter (HOSPITAL_BASED_OUTPATIENT_CLINIC_OR_DEPARTMENT_OTHER): Payer: Self-pay | Admitting: Emergency Medicine

## 2012-10-24 ENCOUNTER — Emergency Department (HOSPITAL_BASED_OUTPATIENT_CLINIC_OR_DEPARTMENT_OTHER)
Admission: EM | Admit: 2012-10-24 | Discharge: 2012-10-24 | Disposition: A | Discharge status: Home / Self Care | Payer: PRIVATE HEALTH INSURANCE | Attending: Emergency Medicine | Admitting: Emergency Medicine

## 2012-10-24 DIAGNOSIS — E119 Type 2 diabetes mellitus without complications: Secondary | ICD-10-CM | POA: Insufficient documentation

## 2012-10-24 DIAGNOSIS — IMO0002 Reserved for concepts with insufficient information to code with codable children: Secondary | ICD-10-CM | POA: Insufficient documentation

## 2012-10-24 DIAGNOSIS — Z86711 Personal history of pulmonary embolism: Secondary | ICD-10-CM | POA: Insufficient documentation

## 2012-10-24 DIAGNOSIS — Z862 Personal history of diseases of the blood and blood-forming organs and certain disorders involving the immune mechanism: Secondary | ICD-10-CM | POA: Insufficient documentation

## 2012-10-24 DIAGNOSIS — Z8639 Personal history of other endocrine, nutritional and metabolic disease: Secondary | ICD-10-CM | POA: Insufficient documentation

## 2012-10-24 DIAGNOSIS — Z8719 Personal history of other diseases of the digestive system: Secondary | ICD-10-CM | POA: Insufficient documentation

## 2012-10-24 DIAGNOSIS — F17200 Nicotine dependence, unspecified, uncomplicated: Secondary | ICD-10-CM | POA: Insufficient documentation

## 2012-10-24 DIAGNOSIS — F411 Generalized anxiety disorder: Secondary | ICD-10-CM | POA: Insufficient documentation

## 2012-10-24 DIAGNOSIS — R509 Fever, unspecified: Secondary | ICD-10-CM | POA: Insufficient documentation

## 2012-10-24 DIAGNOSIS — F3289 Other specified depressive episodes: Secondary | ICD-10-CM | POA: Insufficient documentation

## 2012-10-24 DIAGNOSIS — F419 Anxiety disorder, unspecified: Secondary | ICD-10-CM

## 2012-10-24 DIAGNOSIS — J40 Bronchitis, not specified as acute or chronic: Secondary | ICD-10-CM | POA: Insufficient documentation

## 2012-10-24 DIAGNOSIS — Z880 Allergy status to penicillin: Secondary | ICD-10-CM | POA: Insufficient documentation

## 2012-10-24 DIAGNOSIS — F329 Major depressive disorder, single episode, unspecified: Secondary | ICD-10-CM | POA: Insufficient documentation

## 2012-10-24 HISTORY — DX: Major depressive disorder, single episode, unspecified: F32.9

## 2012-10-24 HISTORY — DX: Gastro-esophageal reflux disease without esophagitis: K21.9

## 2012-10-24 HISTORY — DX: Depression, unspecified: F32.A

## 2012-10-24 HISTORY — DX: Benign neoplasm of unspecified adrenal gland: D35.00

## 2012-10-24 LAB — GLUCOSE, CAPILLARY: Glucose-Capillary: 202 mg/dL — ABNORMAL HIGH (ref 70–99)

## 2012-10-24 MED ORDER — METFORMIN HCL ER 500 MG PO TB24: 500.0000 mg | ORAL_TABLET | Freq: Every day | ORAL | Status: DC

## 2012-10-24 MED ORDER — AZITHROMYCIN 250 MG PO TABS: 250.0000 mg | ORAL_TABLET | Freq: Every day | ORAL | Status: DC

## 2012-10-24 MED ORDER — ALPRAZOLAM 1 MG PO TABS: 1.0000 mg | ORAL_TABLET | Freq: Two times a day (BID) | ORAL | Status: DC

## 2012-10-24 MED ORDER — PAROXETINE HCL 20 MG PO TABS: 40.0000 mg | ORAL_TABLET | ORAL | Status: DC

## 2012-10-24 MED ORDER — ALPRAZOLAM 0.5 MG PO TABS
1.0000 mg | ORAL_TABLET | Freq: Once | ORAL | Status: AC
  Administered 2012-10-24: 1 mg via ORAL
  Filled 2012-10-24: qty 2

## 2012-10-24 NOTE — ED Provider Notes (Addendum)
CSN: [id number]     Arrival date & time 10/24/12  1649 History   First MD Initiated Contact with Patient 10/24/12 1705     Chief Complaint  Patient presents with  . Panic Attack   (Consider location/radiation/quality/duration/timing/severity/associated sxs/prior Treatment) Patient is a 47 y.o. female presenting with anxiety.  Anxiety This is a recurrent problem. The current episode started in the past 7 days. The problem occurs constantly. The problem has been gradually improving. Associated symptoms include a fever. Nothing aggravates the symptoms. She has tried nothing for the symptoms. The treatment provided moderate relief.  Pt is out of her diabetes medication and her xanax and paxil.   Pt is awaiting insurance to see her Md  Past Medical History  Diagnosis Date  . PE (pulmonary embolism)   . Anxiety   . Diabetes mellitus without complication   . Acid reflux   . Depression   . Adrenal benign tumor    History reviewed. No pertinent past surgical history. No family history on file. History  Substance Use Topics  . Smoking status: Current Every Day Smoker -- 1.00 packs/day    Types: Cigarettes  . Smokeless tobacco: Never Used  . Alcohol Use: No   OB History   Grav Para Term Preterm Abortions TAB SAB Ect Mult Living                 Review of Systems  Constitutional: Positive for fever.  Psychiatric/Behavioral: Positive for agitation. The patient is nervous/anxious.   All other systems reviewed and are negative.    Allergies  Amoxicillin; Aspirin; Codeine; and Penicillins  Home Medications   Current Outpatient Rx  Name  Route  Sig  Dispense  Refill  . ALPRAZolam (XANAX) 1 MG tablet   Oral   Take 1 tablet (1 mg total) by mouth 2 (two) times daily.   10 tablet   0   . metFORMIN (GLUCOPHAGE-XR) 500 MG 24 hr tablet   Oral   Take 500 mg by mouth daily with breakfast.         . PARoxetine (PAXIL) 20 MG tablet   Oral   Take 2 tablets (40 mg total) by mouth  every morning.   10 tablet   0   . predniSONE (DELTASONE) 20 MG tablet   Oral   Take 2 tablets (40 mg total) by mouth daily.   8 tablet   0    BP 150/81  Pulse 118  Temp(Src) 99 F (37.2 C) (Oral)  Resp 20  Ht 5' 7.5" (1.715 m)  Wt 250 lb (113.399 kg)  BMI 38.56 kg/m2  SpO2 98%  LMP 10/17/2012 Physical Exam  Nursing note and vitals reviewed. Constitutional: She appears well-developed and well-nourished.  HENT:  Head: Normocephalic.  Right Ear: External ear normal.  Left Ear: External ear normal.  Nose: Nose normal.  Mouth/Throat: Oropharynx is clear and moist.  Eyes: Conjunctivae are normal. Pupils are equal, round, and reactive to light.  Neck: Normal range of motion. Neck supple.  Cardiovascular: Normal rate.   Pulmonary/Chest: Effort normal.  Abdominal: Soft.  Musculoskeletal: Normal range of motion.  Neurological: She is alert.  Skin: Skin is warm.  Psychiatric: She has a normal mood and affect.    ED Course  Procedures (including critical care time) Labs Review Labs Reviewed  GLUCOSE, CAPILLARY - Abnormal; Notable for the following:    Glucose-Capillary 202 (*)    All other components within normal limits   Imaging Review No results found.  EKG Interpretation   None       MDM   1. Anxiety   2. Bronchitis    Pt given xanax here.    Rx for xanax, metformin, paxil and rx for zithromax for bronchitis   Elson Areas, PA-C 10/24/12 1729  Lonia Skinner [place], PA-C 10/24/12 1747  Lonia Skinner [place], [place] 10/24/12 1753

## 2012-10-24 NOTE — ED Provider Notes (Signed)
Medical screening examination/treatment/procedure(s) were performed by non-physician practitioner and as supervising physician I was immediately available for consultation/collaboration.   Rolan Bucco, MD 10/24/12 [phone]

## 2012-10-24 NOTE — ED Notes (Signed)
Patient states she ran out of her anxiety medications four days ago, and out of her diabetes medications for one week.  States he has a blue right middle finger, which is normal for her when she has an anxiety attack.

## 2012-10-24 NOTE — ED Provider Notes (Signed)
Medical screening examination/treatment/procedure(s) were performed by non-physician practitioner and as supervising physician I was immediately available for consultation/collaboration.   Nhia Heaphy, MD 10/24/12 1750 

## 2012-11-04 ENCOUNTER — Encounter (HOSPITAL_BASED_OUTPATIENT_CLINIC_OR_DEPARTMENT_OTHER): Payer: Self-pay | Admitting: Emergency Medicine

## 2012-11-04 ENCOUNTER — Emergency Department (HOSPITAL_BASED_OUTPATIENT_CLINIC_OR_DEPARTMENT_OTHER)
Admission: EM | Admit: 2012-11-04 | Discharge: 2012-11-04 | Disposition: A | Discharge status: Home / Self Care | Payer: PRIVATE HEALTH INSURANCE | Attending: Emergency Medicine | Admitting: Emergency Medicine

## 2012-11-04 DIAGNOSIS — IMO0002 Reserved for concepts with insufficient information to code with codable children: Secondary | ICD-10-CM | POA: Insufficient documentation

## 2012-11-04 DIAGNOSIS — Z86711 Personal history of pulmonary embolism: Secondary | ICD-10-CM | POA: Insufficient documentation

## 2012-11-04 DIAGNOSIS — Z8639 Personal history of other endocrine, nutritional and metabolic disease: Secondary | ICD-10-CM | POA: Insufficient documentation

## 2012-11-04 DIAGNOSIS — Z8719 Personal history of other diseases of the digestive system: Secondary | ICD-10-CM | POA: Insufficient documentation

## 2012-11-04 DIAGNOSIS — Z760 Encounter for issue of repeat prescription: Secondary | ICD-10-CM | POA: Insufficient documentation

## 2012-11-04 DIAGNOSIS — Z79899 Other long term (current) drug therapy: Secondary | ICD-10-CM | POA: Insufficient documentation

## 2012-11-04 DIAGNOSIS — F419 Anxiety disorder, unspecified: Secondary | ICD-10-CM

## 2012-11-04 DIAGNOSIS — Z862 Personal history of diseases of the blood and blood-forming organs and certain disorders involving the immune mechanism: Secondary | ICD-10-CM | POA: Insufficient documentation

## 2012-11-04 DIAGNOSIS — E119 Type 2 diabetes mellitus without complications: Secondary | ICD-10-CM | POA: Insufficient documentation

## 2012-11-04 DIAGNOSIS — F3289 Other specified depressive episodes: Secondary | ICD-10-CM | POA: Insufficient documentation

## 2012-11-04 DIAGNOSIS — F411 Generalized anxiety disorder: Secondary | ICD-10-CM | POA: Insufficient documentation

## 2012-11-04 DIAGNOSIS — F329 Major depressive disorder, single episode, unspecified: Secondary | ICD-10-CM | POA: Insufficient documentation

## 2012-11-04 DIAGNOSIS — Z792 Long term (current) use of antibiotics: Secondary | ICD-10-CM | POA: Insufficient documentation

## 2012-11-04 DIAGNOSIS — Z880 Allergy status to penicillin: Secondary | ICD-10-CM | POA: Insufficient documentation

## 2012-11-04 DIAGNOSIS — F17200 Nicotine dependence, unspecified, uncomplicated: Secondary | ICD-10-CM | POA: Insufficient documentation

## 2012-11-04 MED ORDER — ALPRAZOLAM 0.5 MG PO TABS: 0.5000 mg | ORAL_TABLET | Freq: Two times a day (BID) | ORAL | Status: DC | PRN

## 2012-11-04 MED ORDER — ALPRAZOLAM 0.5 MG PO TABS
1.0000 mg | ORAL_TABLET | Freq: Once | ORAL | Status: AC
  Administered 2012-11-04: 1 mg via ORAL
  Filled 2012-11-04: qty 2

## 2012-11-04 MED ORDER — PAROXETINE HCL 20 MG PO TABS: 40.0000 mg | ORAL_TABLET | ORAL | Status: DC

## 2012-11-04 NOTE — ED Notes (Signed)
Pt. In no distress and called out saying she was not feeling so good. RN asked Pt. To elaborate and she said she felt sort of dizzy.  Encouraged Pt. To stay in the bed and not stand up.

## 2012-11-04 NOTE — ED Provider Notes (Signed)
CSN: [id number]     Arrival date & time 11/04/12  1910 History   First MD Initiated Contact with Patient 11/04/12 2108     Chief Complaint  Patient presents with  . Anxiety  . Panic Attack  . Medication Refill   (Consider location/radiation/quality/duration/timing/severity/associated sxs/prior Treatment) HPI Pt presenting with anxiety and symptoms of panic attack.  She is tearful and feeling very anxious.  She states that she has run out of her xanax and paxil 2 days ago- has missed one day of the medications.  She states that she tried to get the meds filled at her doctors office but she owes them outstanding payment so they are not willing to fill the prescriptions.  She denies any suicidal or homicidal thoughts.  States she has taken 40mg  paxil x 20 years and also takes xanax po BID.  There are no other associated systemic symptoms, there are no other alleviating or modifying factors.   Past Medical History  Diagnosis Date  . PE (pulmonary embolism)   . Anxiety   . Diabetes mellitus without complication   . Acid reflux   . Depression   . Adrenal benign tumor    History reviewed. No pertinent past surgical history. No family history on file. History  Substance Use Topics  . Smoking status: Current Every Day Smoker -- 1.00 packs/day    Types: Cigarettes  . Smokeless tobacco: Never Used  . Alcohol Use: No   OB History   Grav Para Term Preterm Abortions TAB SAB Ect Mult Living                 Review of Systems ROS reviewed and all otherwise negative except for mentioned in HPI  Allergies  Amoxicillin; Aspirin; Codeine; and Penicillins  Home Medications   Current Outpatient Rx  Name  Route  Sig  Dispense  Refill  . ALPRAZolam (XANAX) 1 MG tablet   Oral   Take 1 tablet (1 mg total) by mouth 2 (two) times daily.   10 tablet   0   . metFORMIN (GLUCOPHAGE-XR) 500 MG 24 hr tablet   Oral   Take 1 tablet (500 mg total) by mouth daily with breakfast.   30 tablet   0    . PARoxetine (PAXIL) 20 MG tablet   Oral   Take 2 tablets (40 mg total) by mouth every morning.   10 tablet   0   . ALPRAZolam (XANAX) 0.5 MG tablet   Oral   Take 1 tablet (0.5 mg total) by mouth 2 (two) times daily as needed for sleep.   60 tablet   0   . azithromycin (ZITHROMAX) 250 MG tablet   Oral   Take 1 tablet (250 mg total) by mouth daily. Take first 2 tablets together, then 1 every day until finished.   6 tablet   0   . PARoxetine (PAXIL) 20 MG tablet   Oral   Take 2 tablets (40 mg total) by mouth every morning.   30 tablet   0   . predniSONE (DELTASONE) 20 MG tablet   Oral   Take 2 tablets (40 mg total) by mouth daily.   8 tablet   0    BP 145/91  Pulse 114  Temp(Src) 99.5 F (37.5 C) (Oral)  Resp 16  Ht 5\' 6"  (1.676 m)  Wt 250 lb (113.399 kg)  BMI 40.37 kg/m2  SpO2 97%  LMP 10/17/2012 Vitals reviewed Physical Exam Physical Examination: General appearance - alert,  well appearing, and in no distress Mental status - alert, oriented to person, place, and time Eyes - no conjunctival injection, no scleral icterus Mouth - mucous membranes moist, pharynx normal without lesions Chest - clear to auscultation, no wheezes, rales or rhonchi, symmetric air entry Heart - normal rate, regular rhythm, normal S1, S2, no murmurs, rubs, clicks or gallops Neurological - alert, oriented, normal speech, strength grossly intact, moving all extremities, no tremor Extremities - peripheral pulses normal, no pedal edema, no clubbing or cyanosis Skin - normal coloration and turgor, no rashes Psych- tearful, anxious appearing, pleasant and cooperative  ED Course  Procedures (including critical care time) Labs Review Labs Reviewed - No data to display Imaging Review No results found.  EKG Interpretation   None       MDM   1. Anxiety    Pt presenting with anxiety after missing her xanax and paxil x 1 day- she is not able to have meds refilled by her doctor as she  owes them money.  She has been on these meds chronically and needs to continue them.  No active SI/HI.  Given resources to assist her in finding a new doctor for long term care. Discharged with strict return precautions.  Pt agreeable with plan.    Ethelda Chick, MD 11/04/12 [phone]

## 2012-11-04 NOTE — ED Notes (Signed)
Pt ran out of her Xanax and Paxil yesterday and pmd will not refill.

## 2012-12-06 ENCOUNTER — Encounter (HOSPITAL_COMMUNITY): Payer: Self-pay | Admitting: Emergency Medicine

## 2012-12-06 ENCOUNTER — Emergency Department (HOSPITAL_COMMUNITY)
Admission: EM | Admit: 2012-12-06 | Discharge: 2012-12-06 | Disposition: A | Discharge status: Home / Self Care | Payer: Self-pay | Attending: Emergency Medicine | Admitting: Emergency Medicine

## 2012-12-06 ENCOUNTER — Emergency Department (HOSPITAL_COMMUNITY): Payer: Self-pay

## 2012-12-06 DIAGNOSIS — J45901 Unspecified asthma with (acute) exacerbation: Secondary | ICD-10-CM | POA: Insufficient documentation

## 2012-12-06 DIAGNOSIS — M545 Low back pain, unspecified: Secondary | ICD-10-CM | POA: Insufficient documentation

## 2012-12-06 DIAGNOSIS — F17200 Nicotine dependence, unspecified, uncomplicated: Secondary | ICD-10-CM | POA: Insufficient documentation

## 2012-12-06 DIAGNOSIS — R51 Headache: Secondary | ICD-10-CM | POA: Insufficient documentation

## 2012-12-06 DIAGNOSIS — Z8719 Personal history of other diseases of the digestive system: Secondary | ICD-10-CM | POA: Insufficient documentation

## 2012-12-06 DIAGNOSIS — Z79899 Other long term (current) drug therapy: Secondary | ICD-10-CM | POA: Insufficient documentation

## 2012-12-06 DIAGNOSIS — Z3202 Encounter for pregnancy test, result negative: Secondary | ICD-10-CM | POA: Insufficient documentation

## 2012-12-06 DIAGNOSIS — Z880 Allergy status to penicillin: Secondary | ICD-10-CM | POA: Insufficient documentation

## 2012-12-06 DIAGNOSIS — IMO0002 Reserved for concepts with insufficient information to code with codable children: Secondary | ICD-10-CM | POA: Insufficient documentation

## 2012-12-06 DIAGNOSIS — R05 Cough: Secondary | ICD-10-CM | POA: Insufficient documentation

## 2012-12-06 DIAGNOSIS — F3289 Other specified depressive episodes: Secondary | ICD-10-CM | POA: Insufficient documentation

## 2012-12-06 DIAGNOSIS — R197 Diarrhea, unspecified: Secondary | ICD-10-CM | POA: Insufficient documentation

## 2012-12-06 DIAGNOSIS — R0789 Other chest pain: Secondary | ICD-10-CM | POA: Insufficient documentation

## 2012-12-06 DIAGNOSIS — F329 Major depressive disorder, single episode, unspecified: Secondary | ICD-10-CM | POA: Insufficient documentation

## 2012-12-06 DIAGNOSIS — I4581 Long QT syndrome: Secondary | ICD-10-CM | POA: Insufficient documentation

## 2012-12-06 DIAGNOSIS — R112 Nausea with vomiting, unspecified: Secondary | ICD-10-CM | POA: Insufficient documentation

## 2012-12-06 DIAGNOSIS — M549 Dorsalgia, unspecified: Secondary | ICD-10-CM

## 2012-12-06 DIAGNOSIS — E119 Type 2 diabetes mellitus without complications: Secondary | ICD-10-CM | POA: Insufficient documentation

## 2012-12-06 DIAGNOSIS — Z86711 Personal history of pulmonary embolism: Secondary | ICD-10-CM | POA: Insufficient documentation

## 2012-12-06 DIAGNOSIS — F419 Anxiety disorder, unspecified: Secondary | ICD-10-CM

## 2012-12-06 DIAGNOSIS — J029 Acute pharyngitis, unspecified: Secondary | ICD-10-CM | POA: Insufficient documentation

## 2012-12-06 DIAGNOSIS — R9431 Abnormal electrocardiogram [ECG] [EKG]: Secondary | ICD-10-CM

## 2012-12-06 DIAGNOSIS — F411 Generalized anxiety disorder: Secondary | ICD-10-CM | POA: Insufficient documentation

## 2012-12-06 DIAGNOSIS — R059 Cough, unspecified: Secondary | ICD-10-CM | POA: Insufficient documentation

## 2012-12-06 DIAGNOSIS — R000 Tachycardia, unspecified: Secondary | ICD-10-CM | POA: Insufficient documentation

## 2012-12-06 LAB — URINALYSIS, ROUTINE W REFLEX MICROSCOPIC
Bilirubin Urine: NEGATIVE
Glucose, UA: 100 mg/dL — AB
Hgb urine dipstick: NEGATIVE
Ketones, ur: NEGATIVE mg/dL
Leukocytes, UA: NEGATIVE
Nitrite: NEGATIVE
Specific Gravity, Urine: 1.017 (ref 1.005–1.030)
pH: 5.5 (ref 5.0–8.0)

## 2012-12-06 LAB — CBC WITH DIFFERENTIAL/PLATELET
Basophils Absolute: 0.1 10*3/uL (ref 0.0–0.1)
Basophils Relative: 0 % (ref 0–1)
Eosinophils Relative: 2 % (ref 0–5)
Lymphocytes Relative: 27 % (ref 12–46)
Lymphs Abs: 3.6 10*3/uL (ref 0.7–4.0)
MCHC: 34.7 g/dL (ref 30.0–36.0)
MCV: 96.5 fL (ref 78.0–100.0)
Neutro Abs: 8.5 10*3/uL — ABNORMAL HIGH (ref 1.7–7.7)
Neutrophils Relative %: 64 % (ref 43–77)
Platelets: 259 10*3/uL (ref 150–400)
RBC: 4.89 MIL/uL (ref 3.87–5.11)
RDW: 13.5 % (ref 11.5–15.5)
WBC: 13.2 10*3/uL — ABNORMAL HIGH (ref 4.0–10.5)

## 2012-12-06 LAB — COMPREHENSIVE METABOLIC PANEL
ALT: 21 U/L (ref 0–35)
AST: 15 U/L (ref 0–37)
Alkaline Phosphatase: 67 U/L (ref 39–117)
CO2: 21 mEq/L (ref 19–32)
Calcium: 9.6 mg/dL (ref 8.4–10.5)
GFR calc non Af Amer: 72 mL/min — ABNORMAL LOW (ref >90)
Potassium: 3.7 mEq/L (ref 3.5–5.1)
Sodium: 136 mEq/L (ref 135–145)

## 2012-12-06 LAB — PREGNANCY, URINE: Preg Test, Ur: NEGATIVE

## 2012-12-06 LAB — D-DIMER, QUANTITATIVE: D-Dimer, Quant: 0.84 ug/mL-FEU — ABNORMAL HIGH (ref 0.00–0.48)

## 2012-12-06 LAB — MAGNESIUM: Magnesium: 2 mg/dL (ref 1.5–2.5)

## 2012-12-06 MED ORDER — IOHEXOL 350 MG/ML SOLN
100.0000 mL | Freq: Once | INTRAVENOUS | Status: AC | PRN
  Administered 2012-12-06: 100 mL via INTRAVENOUS

## 2012-12-06 MED ORDER — ALBUTEROL SULFATE HFA 108 (90 BASE) MCG/ACT IN AERS: 2.0000 | INHALATION_SPRAY | RESPIRATORY_TRACT | Status: DC | PRN

## 2012-12-06 MED ORDER — MORPHINE SULFATE 4 MG/ML IJ SOLN
4.0000 mg | Freq: Once | INTRAMUSCULAR | Status: AC
  Administered 2012-12-06: 4 mg via INTRAVENOUS
  Filled 2012-12-06: qty 1

## 2012-12-06 MED ORDER — ALPRAZOLAM 0.5 MG PO TABS
0.5000 mg | ORAL_TABLET | Freq: Once | ORAL | Status: AC
  Administered 2012-12-06: 0.5 mg via ORAL
  Filled 2012-12-06: qty 1

## 2012-12-06 MED ORDER — SODIUM CHLORIDE 0.9 % IV BOLUS (SEPSIS)
1000.0000 mL | Freq: Once | INTRAVENOUS | Status: AC
  Administered 2012-12-06: 1000 mL via INTRAVENOUS

## 2012-12-06 NOTE — ED Notes (Addendum)
Pt from home reports cold s/sx x2 days with dry, non-productive cough, sore throat. Pt states that she has wood stove and it "aggravates my asthma." Pt adds that she is having N/V/D that started this am. Pt denies CP. Pt is A&O and in NAD. Pt has inhalers at home but did not use PTA

## 2012-12-06 NOTE — ED Notes (Signed)
PT reports 10/10 lower back pain and is asking for medication.

## 2012-12-06 NOTE — ED Provider Notes (Signed)
CSN: [id number]     Arrival date & time 12/06/12  1346 History   First MD Initiated Contact with Patient 12/06/12 1405     Chief Complaint  Patient presents with  . Shortness of Breath  . Emesis  . Diarrhea    HPI  [name] is a 47 y.o. female with a PMH of PE, DM, acid reflux, anxiety, adrenal benign tumor, and depression who presents to the ED for evaluation of SOB, emesis, and diarrhea.  History was provided by the patient.  Patient has multiple complaints. Patient states that she developed nausea, vomiting, and diarrhea this morning.  She had 3 episodes of vomiting and 2 episodes of diarrhea. No hematemesis or hematochezia. Diarrhea is described as a dark green and watery substance. Patient denies any known sick contacts. No recent travel. She states she has a few intermittent "twinges" of right lower quadrant pain which lasts a few seconds, however denies currently. She has not taken anything for pain prior to arrival. She also complains of some left-sided lower back pain. She states this is her baseline back pain with no acute changes. She denies any injuries trauma.  No weakness, loss of sensation, numbness, or tingling.  No dysuria, vaginal discharge/bleeding, hematuria, or pelvic pain.  She also states that she has had chest tightness and SOB for the past two days.  Her symptoms are similar to her asthma exacerbations in the past.  She has a hx of PE but her symptoms today are not similar.  She is not on any anticoagulation.  She has also had an intermittent cough which is non-productive.  No wheezing.  She also has had nasal congestion, sore throat, and a hoarse voice from "coughing" as well as intermittent headaches, but denies one currently.  She denies any neck pain, rhinorrhea, dizziness, or lightheadedness.  She has been out of her Xanax and Paxil and has had worsening anxiety.  She also has been out of her Metformin.  She is a current tobacco user.  No fevers, chills, change in  appetite/activity, or leg edema/calf tenderness.       Past Medical History  Diagnosis Date  . PE (pulmonary embolism)   . Anxiety   . Diabetes mellitus without complication   . Acid reflux   . Depression   . Adrenal benign tumor    History reviewed. No pertinent past surgical history. No family history on file. History  Substance Use Topics  . Smoking status: Current Every Day Smoker -- 1.00 packs/day    Types: Cigarettes  . Smokeless tobacco: Never Used  . Alcohol Use: No   OB History   Grav Para Term Preterm Abortions TAB SAB Ect Mult Living                  Review of Systems  Constitutional: Negative for fever, chills, diaphoresis, activity change, appetite change and fatigue.  HENT: Positive for congestion, sore throat and voice change. Negative for ear pain, mouth sores, rhinorrhea and trouble swallowing.   Eyes: Negative for photophobia and visual disturbance.  Respiratory: Positive for cough, chest tightness and shortness of breath. Negative for wheezing.   Cardiovascular: Negative for chest pain, palpitations and leg swelling.  Gastrointestinal: Positive for nausea, vomiting, abdominal pain (resolved) and diarrhea. Negative for constipation, blood in stool, anal bleeding and rectal pain.  Genitourinary: Negative for dysuria, hematuria, decreased urine volume, vaginal bleeding, vaginal discharge, vaginal pain and pelvic pain.  Musculoskeletal: Positive for back pain. Negative for arthralgias,  gait problem, joint swelling, myalgias and neck pain.  Skin: Negative for color change and wound.  Neurological: Positive for headaches. Negative for dizziness, syncope, weakness, light-headedness and numbness.  Psychiatric/Behavioral: The patient is nervous/anxious.     Allergies  Amoxicillin; Aspirin; Codeine; and Penicillins  Home Medications   Current Outpatient Rx  Name  Route  Sig  Dispense  Refill  . ALPRAZolam (XANAX) 0.5 MG tablet   Oral   Take 1 tablet (0.5 mg  total) by mouth 2 (two) times daily as needed for sleep.   60 tablet   0   . ALPRAZolam (XANAX) 1 MG tablet   Oral   Take 1 tablet (1 mg total) by mouth 2 (two) times daily.   10 tablet   0   . azithromycin (ZITHROMAX) 250 MG tablet   Oral   Take 1 tablet (250 mg total) by mouth daily. Take first 2 tablets together, then 1 every day until finished.   6 tablet   0   . metFORMIN (GLUCOPHAGE-XR) 500 MG 24 hr tablet   Oral   Take 1 tablet (500 mg total) by mouth daily with breakfast.   30 tablet   0   . PARoxetine (PAXIL) 20 MG tablet   Oral   Take 2 tablets (40 mg total) by mouth every morning.   10 tablet   0   . PARoxetine (PAXIL) 20 MG tablet   Oral   Take 2 tablets (40 mg total) by mouth every morning.   30 tablet   0   . predniSONE (DELTASONE) 20 MG tablet   Oral   Take 2 tablets (40 mg total) by mouth daily.   8 tablet   0    BP 137/76  Pulse 115  Temp(Src) 98.7 F (37.1 C) (Oral)  Resp 15  SpO2 97%  LMP 11/07/2012  Filed Vitals:   12/06/12 1607 12/06/12 1607 12/06/12 1823 12/06/12 1842  BP:  157/89 148/82   Pulse:  114 103   Temp: 98.8 F (37.1 C)   98.8 F (37.1 C)  TempSrc:    Oral  Resp:  22 16   SpO2:  98% 97%     Physical Exam  Nursing note and vitals reviewed. Constitutional: She is oriented to person, place, and time. She appears well-developed and well-nourished. No distress.  HENT:  Head: Normocephalic and atraumatic.  Right Ear: External ear normal.  Left Ear: External ear normal.  Nose: Nose normal.  Mouth/Throat: Oropharynx is clear and moist. No oropharyngeal exudate.  Uvula midline.  Mild erythema to the posterior pharynx.  Hoarse voice.    Eyes: Conjunctivae and EOM are normal. Pupils are equal, round, and reactive to light. Right eye exhibits no discharge. Left eye exhibits no discharge.  Neck: Normal range of motion. Neck supple.  Cardiovascular: Regular rhythm, normal heart sounds and intact distal pulses.  Exam reveals no  gallop and no friction rub.   No murmur heard. Tachycardic  Pulmonary/Chest: Effort normal and breath sounds normal. No respiratory distress. She has no wheezes. She has no rales. She exhibits no tenderness.  Abdominal: Soft. Bowel sounds are normal. She exhibits no distension and no mass. There is no tenderness. There is no rebound and no guarding.  Musculoskeletal: Normal range of motion. She exhibits no edema and no tenderness.  No pedal edema or calf tenderness bilaterally.  No tenderness to palpation to the back throughout.  Patient able to ambulate without difficulty or ataxia.    Neurological: She  is alert and oriented to person, place, and time. No cranial nerve deficit.  GCS 15. No focal neurological deficits.  CN 2-12 intact.   Skin: Skin is warm and dry. She is not diaphoretic.    ED Course  Procedures (including critical care time) Labs Review Labs Reviewed - No data to display Imaging Review No results found.  EKG Interpretation    Date/Time:  Saturday December 06 2012 13:55:54 EST Ventricular Rate:  115 PR Interval:  79 QRS Duration: 111 QT Interval:  381 QTC Calculation: 527 R Axis:   0 Text Interpretation:  Sinus tachycardia Indeterminate axis Lateral infarct, old Prolonged QT interval Since last tracing QT has lengthened Confirmed by GEXBM  MD, ELLIOTT ([id number]) on 12/06/2012 3:15:08 PM           Results for orders placed during the hospital encounter of 12/06/12  RAPID STREP SCREEN      Result Value Range   Streptococcus, Group A Screen (Direct) NEGATIVE  NEGATIVE  CULTURE, GROUP A STREP      Result Value Range   Specimen Description THROAT     Special Requests NONE     Culture       Value: NO SUSPICIOUS COLONIES, CONTINUING TO HOLD     Performed at [facility]   Report Status PENDING    CBC WITH DIFFERENTIAL      Result Value Range   WBC 13.2 (*) 4.0 - 10.5 K/uL   RBC 4.89  3.87 - 5.11 MIL/uL   Hemoglobin 16.4 (*) 12.0 - 15.0 g/dL   HCT  84.1 (*) 32.4 - 46.0 %   MCV 96.5  78.0 - 100.0 fL   MCH 33.5  26.0 - 34.0 pg   MCHC 34.7  30.0 - 36.0 g/dL   RDW 40.1  02.7 - 25.3 %   Platelets 259  150 - 400 K/uL   Neutrophils Relative % 64  43 - 77 %   Neutro Abs 8.5 (*) 1.7 - 7.7 K/uL   Lymphocytes Relative 27  12 - 46 %   Lymphs Abs 3.6  0.7 - 4.0 K/uL   Monocytes Relative 6  3 - 12 %   Monocytes Absolute 0.8  0.1 - 1.0 K/uL   Eosinophils Relative 2  0 - 5 %   Eosinophils Absolute 0.3  0.0 - 0.7 K/uL   Basophils Relative 0  0 - 1 %   Basophils Absolute 0.1  0.0 - 0.1 K/uL  COMPREHENSIVE METABOLIC PANEL      Result Value Range   Sodium 136  135 - 145 mEq/L   Potassium 3.7  3.5 - 5.1 mEq/L   Chloride 100  96 - 112 mEq/L   CO2 21  19 - 32 mEq/L   Glucose, Bld 175 (*) 70 - 99 mg/dL   BUN 13  6 - 23 mg/dL   Creatinine, Ser 6.64  0.50 - 1.10 mg/dL   Calcium 9.6  8.4 - 40.3 mg/dL   Total Protein 7.5  6.0 - 8.3 g/dL   Albumin 4.1  3.5 - 5.2 g/dL   AST 15  0 - 37 U/L   ALT 21  0 - 35 U/L   Alkaline Phosphatase 67  39 - 117 U/L   Total Bilirubin 0.3  0.3 - 1.2 mg/dL   GFR calc non Af Amer 72 (*) >90 mL/min   GFR calc Af Amer 83 (*) >90 mL/min  LIPASE, BLOOD      Result Value Range  Lipase 30  11 - 59 U/L  TROPONIN I      Result Value Range   Troponin I <0.30  <0.30 ng/mL  PREGNANCY, URINE      Result Value Range   Preg Test, Ur NEGATIVE  NEGATIVE  URINALYSIS, ROUTINE W REFLEX MICROSCOPIC      Result Value Range   Color, Urine YELLOW  YELLOW   APPearance CLEAR  CLEAR   Specific Gravity, Urine 1.017  1.005 - 1.030   pH 5.5  5.0 - 8.0   Glucose, UA 100 (*) NEGATIVE mg/dL   Hgb urine dipstick NEGATIVE  NEGATIVE   Bilirubin Urine NEGATIVE  NEGATIVE   Ketones, ur NEGATIVE  NEGATIVE mg/dL   Protein, ur NEGATIVE  NEGATIVE mg/dL   Urobilinogen, UA 0.2  0.0 - 1.0 mg/dL   Nitrite NEGATIVE  NEGATIVE   Leukocytes, UA NEGATIVE  NEGATIVE  D-DIMER, QUANTITATIVE      Result Value Range   D-Dimer, Quant 0.84 (*) 0.00 - 0.48  ug/mL-FEU  MAGNESIUM      Result Value Range   Magnesium 2.0  1.5 - 2.5 mg/dL    CT Angio Chest PE W/Cm &/Or Wo Cm (Final result)  Result time: 12/06/12 16:47:34    Final result by Rad Results In Interface (12/06/12 16:47:34)    Narrative:   CLINICAL DATA: Tachycardia. Shortness of breath. Elevated D-dimer.  EXAM: CT ANGIOGRAPHY CHEST WITH CONTRAST  TECHNIQUE: Multidetector CT imaging of the chest was performed using the standard protocol during bolus administration of intravenous contrast. Multiplanar CT image reconstructions including MIPs were obtained to evaluate the vascular anatomy.  CONTRAST: OMNIPAQUE IOHEXOL 350 MG/ML SOLN  COMPARISON: Chest x-ray 12/06/2012. Chest CTs dating to 04/11/2010. CT abdomen report 08/29/2010.  FINDINGS: Great vessels are unremarkable. Thoracic aorta normal. No pulmonary embolus. Heart size normal. No pericardial effusion. No calcific cardiac valvular disease noted. Shotty mediastinal and hilar lymph nodes are noted. Thoracic esophagus appears unremarkable. Gastroesophageal junction is normal.  Large airways patent. Mild changes of bullous COPD. Lungs are clear. No focal infiltrate. No pleural effusion or pneumothorax. Calcified pulmonary nodule left lower lobe consistent with granuloma.  4 cm left adrenal lesion, previously characterized as an adenoma as per CT report of 08/29/2010. Chest wall is intact. Axillary and visualized supraclavicular regions are normal. Thyroid is normal. No acute bony abnormality.  Review of the MIP images confirms the above findings.  IMPRESSION: No acute abnormality. No evidence of pulmonary embolus. Bullous COPD.   Electronically Signed By: Maisie Fus Register On: 12/06/2012 16:47      DG Chest 2 View (Final result)  Result time: 12/06/12 15:58:07    Final result by Rad Results In Interface (12/06/12 15:58:07)    Narrative:   CLINICAL DATA: Chest pain  EXAM: CHEST - 2  VIEW  COMPARISON: 07/15/2012  FINDINGS: Mildly prominent coarse interstitial markings as before. No confluent infiltrate or overt edema. No effusion. Heart size upper limits normal. Regional bones unremarkable.  IMPRESSION: Chronic interstitial prominence. No acute disease.   Electronically Signed By: Oley Balm M.D. On: 12/06/2012 15:58         MDM   [name] is a 47 y.o. female  with a PMH of PE, DM, acid reflux, anxiety, adrenal benign tumor, and depression who presents to the ED for evaluation of SOB, emesis, and diarrhea.  Bolus ordered for tachycardia.  Chest x-ray, EKG, Mg, rapid strep, d-dimer, UA, lipase, troponin, CBC, and CMP ordered.     Rechecks  4:33  PM = Patient requesting something for back pain.  4 mg morphine ordered.   5:45 PM = Patient's abdominal pain resolved.  Back pain improved to a 4/10.  Patient able to get up without difficulty.   6:40 PM = Patient states she feels much better after xanax.  No pain.  Asymptomatic.  Ready for discharge.     Patient evaluated in the ED for multiple complaints.  She complained of nausea, vomiting, and diarrhea.  Possibly due to gastroenteritis.  Abdominal exam benign.  No episodes of emesis throughout ED visit.  Labs unremarkable.  She had a few "twinges" of abdominal pain prior to arrival which resolved.  She also complained of sore throat, cough, congestion, and SOB.  Rapid strep negative.  She had an elevated d-dimer which was positive.  Subsequent CT angio negative for PE or cardiopulmonary process.  No chest pain.  Negative troponin.  EKG negative for acute ischemic changes.  Patient also complained of back pain which is similar to her chronic back pain.  She had improvements in her back pain at discharge.  Patient found to have prolonged QTc.  She had no syncope or palpitations.  She was tachycardic during her ED visit.  Patient has a history of tachycardia, which was reviewed and seen on previous ED visits.   Anxiety is a possible cause of this.  Patient ran out of her Xanax and Paxil two days prior to arrival.  She was given her home dose of xanax in the ED and her symptoms & tachycardia improved.  Patient was instructed to follow-up with her PCP for further evaluation and management.  She was given a refill prescription for her Albuterol inhaler as she is almost out of her prescription. Patient encouraged to stop smoking.  Patient given return precautions, follow-up, and discharge instructions.  Left with husband who is in the ED.      Discharge Medication List as of 12/06/2012  6:27 PM      Final impressions: 1. Nausea vomiting and diarrhea   2. Prolonged QT interval   3. Tachycardia   4. Anxiety   5. Cough   6. Back pain   7. Sore throat      Greer Ee Cade Olberding PA-C          Jillyn Ledger, [place] 12/07/12 2131

## 2012-12-08 ENCOUNTER — Emergency Department (HOSPITAL_BASED_OUTPATIENT_CLINIC_OR_DEPARTMENT_OTHER)
Admission: EM | Admit: 2012-12-08 | Discharge: 2012-12-08 | Disposition: A | Discharge status: Home / Self Care | Payer: PRIVATE HEALTH INSURANCE | Attending: Emergency Medicine | Admitting: Emergency Medicine

## 2012-12-08 ENCOUNTER — Encounter (HOSPITAL_BASED_OUTPATIENT_CLINIC_OR_DEPARTMENT_OTHER): Payer: Self-pay | Admitting: Emergency Medicine

## 2012-12-08 DIAGNOSIS — E119 Type 2 diabetes mellitus without complications: Secondary | ICD-10-CM | POA: Insufficient documentation

## 2012-12-08 DIAGNOSIS — Z8719 Personal history of other diseases of the digestive system: Secondary | ICD-10-CM | POA: Insufficient documentation

## 2012-12-08 DIAGNOSIS — F3289 Other specified depressive episodes: Secondary | ICD-10-CM | POA: Insufficient documentation

## 2012-12-08 DIAGNOSIS — Z86711 Personal history of pulmonary embolism: Secondary | ICD-10-CM | POA: Insufficient documentation

## 2012-12-08 DIAGNOSIS — Z79899 Other long term (current) drug therapy: Secondary | ICD-10-CM | POA: Insufficient documentation

## 2012-12-08 DIAGNOSIS — F329 Major depressive disorder, single episode, unspecified: Secondary | ICD-10-CM | POA: Insufficient documentation

## 2012-12-08 DIAGNOSIS — F419 Anxiety disorder, unspecified: Secondary | ICD-10-CM

## 2012-12-08 DIAGNOSIS — R000 Tachycardia, unspecified: Secondary | ICD-10-CM | POA: Insufficient documentation

## 2012-12-08 DIAGNOSIS — G479 Sleep disorder, unspecified: Secondary | ICD-10-CM | POA: Insufficient documentation

## 2012-12-08 DIAGNOSIS — F411 Generalized anxiety disorder: Secondary | ICD-10-CM | POA: Insufficient documentation

## 2012-12-08 DIAGNOSIS — Z760 Encounter for issue of repeat prescription: Secondary | ICD-10-CM | POA: Insufficient documentation

## 2012-12-08 DIAGNOSIS — F17200 Nicotine dependence, unspecified, uncomplicated: Secondary | ICD-10-CM | POA: Insufficient documentation

## 2012-12-08 DIAGNOSIS — Z880 Allergy status to penicillin: Secondary | ICD-10-CM | POA: Insufficient documentation

## 2012-12-08 LAB — CULTURE, GROUP A STREP

## 2012-12-08 MED ORDER — ALPRAZOLAM 0.5 MG PO TABS: 0.5000 mg | ORAL_TABLET | Freq: Two times a day (BID) | ORAL | Status: DC | PRN

## 2012-12-08 MED ORDER — LORAZEPAM 1 MG PO TABS
2.0000 mg | ORAL_TABLET | Freq: Once | ORAL | Status: AC
  Administered 2012-12-08: 2 mg via ORAL

## 2012-12-08 MED ORDER — FLUOXETINE HCL 20 MG PO TABS: 40.0000 mg | ORAL_TABLET | Freq: Every day | ORAL | Status: DC

## 2012-12-08 MED ORDER — LORAZEPAM 1 MG PO TABS
ORAL_TABLET | ORAL | Status: AC
  Filled 2012-12-08: qty 2

## 2012-12-08 NOTE — ED Notes (Signed)
Pt reports that she is here Paxil and xanax refilled.  Reports anxiety.  Has been seen at [facility] long 2 days ago and referred to come here.

## 2012-12-08 NOTE — ED Provider Notes (Signed)
CSN: [id number]     Arrival date & time 12/08/12  1331 History   First MD Initiated Contact with Patient 12/08/12 1419     Chief Complaint  Patient presents with  . Anxiety   (Consider location/radiation/quality/duration/timing/severity/associated sxs/prior Treatment) Patient is a 47 y.o. female presenting with anxiety. The history is provided by the patient.  Anxiety This is a chronic problem. Episode onset: severe over the last 2 days since she ran out of medication. The problem occurs constantly. The problem has been gradually worsening. Pertinent negatives include no chest pain, no abdominal pain and no shortness of breath. Associated symptoms comments: Palpitations, anxious, nervous . The symptoms are aggravated by stress. Nothing relieves the symptoms. She has tried nothing for the symptoms. The treatment provided no relief.    Past Medical History  Diagnosis Date  . PE (pulmonary embolism)   . Anxiety   . Diabetes mellitus without complication   . Acid reflux   . Depression   . Adrenal benign tumor    History reviewed. No pertinent past surgical history. History reviewed. No pertinent family history. History  Substance Use Topics  . Smoking status: Current Every Day Smoker -- 1.00 packs/day    Types: Cigarettes  . Smokeless tobacco: Never Used  . Alcohol Use: No   OB History   Grav Para Term Preterm Abortions TAB SAB Ect Mult Living                 Review of Systems  Constitutional: Negative for fever and chills.  Respiratory: Negative for shortness of breath.   Cardiovascular: Negative for chest pain.  Gastrointestinal: Negative for abdominal pain.  Psychiatric/Behavioral: Positive for sleep disturbance. Negative for suicidal ideas. The patient is nervous/anxious.   All other systems reviewed and are negative.    Allergies  Amoxicillin; Aspirin; Codeine; and Penicillins  Home Medications   Current Outpatient Rx  Name  Route  Sig  Dispense  Refill  .  albuterol (PROVENTIL HFA;VENTOLIN HFA) 108 (90 BASE) MCG/ACT inhaler   Inhalation   Inhale 2 puffs into the lungs every 4 (four) hours as needed for wheezing or shortness of breath.   1 Inhaler   0   . ALPRAZolam (XANAX) 0.5 MG tablet   Oral   Take 1 tablet (0.5 mg total) by mouth 2 (two) times daily as needed for sleep.   60 tablet   0   . ALPRAZolam (XANAX) 0.5 MG tablet   Oral   Take 1 tablet (0.5 mg total) by mouth 2 (two) times daily as needed for anxiety.   60 tablet   1   . ALPRAZolam (XANAX) 1 MG tablet   Oral   Take 1 tablet (1 mg total) by mouth 2 (two) times daily.   10 tablet   0   . diphenhydramine-acetaminophen (TYLENOL PM) 25-500 MG TABS   Oral   Take 1 tablet by mouth at bedtime as needed.         Marland Kitchen FLUoxetine (PROZAC) 20 MG tablet   Oral   Take 2 tablets (40 mg total) by mouth daily.   60 tablet   1   . metFORMIN (GLUCOPHAGE-XR) 500 MG 24 hr tablet   Oral   Take 1 tablet (500 mg total) by mouth daily with breakfast.   30 tablet   0   . PARoxetine (PAXIL) 20 MG tablet   Oral   Take 2 tablets (40 mg total) by mouth every morning.   30 tablet  0    BP 154/94  Pulse 118  Temp(Src) 98.7 F (37.1 C) (Oral)  Resp 24  Ht 5\' 6"  (1.676 m)  Wt 260 lb (117.935 kg)  BMI 41.99 kg/m2  SpO2 97%  LMP 11/07/2012 Physical Exam  Nursing note and vitals reviewed. Constitutional: She is oriented to person, place, and time. She appears well-developed and well-nourished. No distress.  HENT:  Head: Normocephalic and atraumatic.  Mouth/Throat: Oropharynx is clear and moist.  Eyes: Conjunctivae and EOM are normal. Pupils are equal, round, and reactive to light.  Neck: Normal range of motion. Neck supple.  Cardiovascular: Regular rhythm and intact distal pulses.  Tachycardia present.   No murmur heard. Pulmonary/Chest: Effort normal and breath sounds normal. No respiratory distress. She has no wheezes. She has no rales.  Abdominal: Soft. She exhibits no  distension. There is no tenderness. There is no rebound and no guarding.  Musculoskeletal: Normal range of motion. She exhibits no edema and no tenderness.  Neurological: She is alert and oriented to person, place, and time.  Skin: Skin is warm and dry. No rash noted. No erythema.  Psychiatric: Her behavior is normal. Her mood appears anxious. She expresses no suicidal plans and no homicidal plans.    ED Course  Procedures (including critical care time) Labs Review Labs Reviewed - No data to display Imaging Review Dg Chest 2 View  12/06/2012   CLINICAL DATA:  Chest pain  EXAM: CHEST - 2 VIEW  COMPARISON:  07/15/2012  FINDINGS: Mildly prominent coarse interstitial markings as before. No confluent infiltrate or overt edema. No effusion. Heart size upper limits normal. Regional bones unremarkable.  IMPRESSION: Chronic interstitial prominence.  No acute disease.   Electronically Signed   By: Oley Balm M.D.   On: 12/06/2012 15:58   Ct Angio Chest Pe W/cm &/or Wo Cm  12/06/2012   CLINICAL DATA:  Tachycardia. Shortness of breath. Elevated D-dimer.  EXAM: CT ANGIOGRAPHY CHEST WITH CONTRAST  TECHNIQUE: Multidetector CT imaging of the chest was performed using the standard protocol during bolus administration of intravenous contrast. Multiplanar CT image reconstructions including MIPs were obtained to evaluate the vascular anatomy.  CONTRAST:  OMNIPAQUE IOHEXOL 350 MG/ML SOLN  COMPARISON:  Chest x-ray 12/06/2012. Chest CTs dating to 04/11/2010. CT abdomen report 08/29/2010.  FINDINGS: Great vessels are unremarkable. Thoracic aorta normal. No pulmonary embolus. Heart size normal. No pericardial effusion. No calcific cardiac valvular disease noted. Shotty mediastinal and hilar lymph nodes are noted. Thoracic esophagus appears unremarkable. Gastroesophageal junction is normal.  Large airways patent. Mild changes of bullous COPD. Lungs are clear. No focal infiltrate. No pleural effusion or  pneumothorax. Calcified pulmonary nodule left lower lobe consistent with granuloma.  4 cm left adrenal lesion, previously characterized as an adenoma as per CT report of 08/29/2010. Chest wall is intact. Axillary and visualized supraclavicular regions are normal. Thyroid is normal. No acute bony abnormality.  Review of the MIP images confirms the above findings.  IMPRESSION: No acute abnormality. No evidence of pulmonary embolus. Bullous COPD.   Electronically Signed   By: Maisie Fus  Register   On: 12/06/2012 16:47    EKG Interpretation   None       MDM   1. Anxiety   2. Medication refill     Patient is here complaining of anxiety and is severe because she ran out of her Paxil and Xanax 2 days ago. Because of an insurance issue she does her PCP $300 so she has not been able  to get an appointment to get her prescriptions refilled. She denies suicidal ideation and homicidal ideation. We'll give the patient her refill prescriptions.    Gwyneth Sprout, MD 12/08/12 [phone]

## 2012-12-11 NOTE — ED Provider Notes (Signed)
Medical screening examination/treatment/procedure(s) were performed by non-physician practitioner and as supervising physician I was immediately available for consultation/collaboration.  Ginia Rudell L Citlaly Camplin, MD 12/11/12 0736 

## 2013-02-10 ENCOUNTER — Encounter (HOSPITAL_BASED_OUTPATIENT_CLINIC_OR_DEPARTMENT_OTHER): Payer: Self-pay | Admitting: Emergency Medicine

## 2013-02-10 ENCOUNTER — Emergency Department (HOSPITAL_BASED_OUTPATIENT_CLINIC_OR_DEPARTMENT_OTHER): Payer: PRIVATE HEALTH INSURANCE

## 2013-02-10 ENCOUNTER — Emergency Department (HOSPITAL_BASED_OUTPATIENT_CLINIC_OR_DEPARTMENT_OTHER)
Admission: EM | Admit: 2013-02-10 | Discharge: 2013-02-10 | Disposition: A | Discharge status: Home / Self Care | Payer: PRIVATE HEALTH INSURANCE | Attending: Emergency Medicine | Admitting: Emergency Medicine

## 2013-02-10 DIAGNOSIS — F17200 Nicotine dependence, unspecified, uncomplicated: Secondary | ICD-10-CM | POA: Insufficient documentation

## 2013-02-10 DIAGNOSIS — J40 Bronchitis, not specified as acute or chronic: Secondary | ICD-10-CM

## 2013-02-10 DIAGNOSIS — E119 Type 2 diabetes mellitus without complications: Secondary | ICD-10-CM | POA: Insufficient documentation

## 2013-02-10 DIAGNOSIS — Z86711 Personal history of pulmonary embolism: Secondary | ICD-10-CM | POA: Insufficient documentation

## 2013-02-10 DIAGNOSIS — F3289 Other specified depressive episodes: Secondary | ICD-10-CM | POA: Insufficient documentation

## 2013-02-10 DIAGNOSIS — F329 Major depressive disorder, single episode, unspecified: Secondary | ICD-10-CM | POA: Insufficient documentation

## 2013-02-10 DIAGNOSIS — J209 Acute bronchitis, unspecified: Secondary | ICD-10-CM | POA: Insufficient documentation

## 2013-02-10 DIAGNOSIS — Z8719 Personal history of other diseases of the digestive system: Secondary | ICD-10-CM | POA: Insufficient documentation

## 2013-02-10 DIAGNOSIS — Z880 Allergy status to penicillin: Secondary | ICD-10-CM | POA: Insufficient documentation

## 2013-02-10 DIAGNOSIS — F411 Generalized anxiety disorder: Secondary | ICD-10-CM | POA: Insufficient documentation

## 2013-02-10 DIAGNOSIS — Z79899 Other long term (current) drug therapy: Secondary | ICD-10-CM | POA: Insufficient documentation

## 2013-02-10 DIAGNOSIS — Z760 Encounter for issue of repeat prescription: Secondary | ICD-10-CM | POA: Insufficient documentation

## 2013-02-10 MED ORDER — AZITHROMYCIN 250 MG PO TABS: ORAL_TABLET | ORAL | Status: DC

## 2013-02-10 MED ORDER — PAROXETINE HCL 20 MG PO TABS: 40.0000 mg | ORAL_TABLET | ORAL | Status: DC

## 2013-02-10 MED ORDER — ALPRAZOLAM 1 MG PO TABS: 1.0000 mg | ORAL_TABLET | Freq: Two times a day (BID) | ORAL | Status: DC

## 2013-02-10 MED ORDER — ALPRAZOLAM 0.5 MG PO TABS
1.0000 mg | ORAL_TABLET | Freq: Once | ORAL | Status: AC
  Administered 2013-02-10: 1 mg via ORAL
  Filled 2013-02-10: qty 2

## 2013-02-10 NOTE — Discharge Instructions (Signed)
Bronchitis Bronchitis is inflammation of the airways that extend from the windpipe into the lungs (bronchi). The inflammation often causes mucus to develop, which leads to a cough. If the inflammation becomes severe, it may cause shortness of breath. CAUSES  Bronchitis may be caused by:   Viral infections.   Bacteria.   Cigarette smoke.   Allergens, pollutants, and other irritants.  SIGNS AND SYMPTOMS  The most common symptom of bronchitis is a frequent cough that produces mucus. Other symptoms include:  Fever.   Body aches.   Chest congestion.   Chills.   Shortness of breath.   Sore throat.  DIAGNOSIS  Bronchitis is usually diagnosed through a medical history and physical exam. Tests, such as chest X-rays, are sometimes done to rule out other conditions.  TREATMENT  You may need to avoid contact with whatever caused the problem (smoking, for example). Medicines are sometimes needed. These may include:  Antibiotics. These may be prescribed if the condition is caused by bacteria.  Cough suppressants. These may be prescribed for relief of cough symptoms.   Inhaled medicines. These may be prescribed to help open your airways and make it easier for you to breathe.   Steroid medicines. These may be prescribed for those with recurrent (chronic) bronchitis. HOME CARE INSTRUCTIONS  Get plenty of rest.   Drink enough fluids to keep your urine clear or pale yellow (unless you have a medical condition that requires fluid restriction). Increasing fluids may help thin your secretions and will prevent dehydration.   Only take over-the-counter or prescription medicines as directed by your health care provider.  Only take antibiotics as directed. Make sure you finish them even if you start to feel better.  Avoid secondhand smoke, irritating chemicals, and strong fumes. These will make bronchitis worse. If you are a smoker, quit smoking. Consider using nicotine gum or  skin patches to help control withdrawal symptoms. Quitting smoking will help your lungs heal faster.   Put a cool-mist humidifier in your bedroom at night to moisten the air. This may help loosen mucus. Change the water in the humidifier daily. You can also run the hot water in your shower and sit in the bathroom with the door closed for 5 10 minutes.   Follow up with your health care provider as directed.   Wash your hands frequently to avoid catching bronchitis again or spreading an infection to others.  SEEK MEDICAL CARE IF: Your symptoms do not improve after 1 week of treatment.  SEEK IMMEDIATE MEDICAL CARE IF:  Your fever increases.  You have chills.   You have chest pain.   You have worsening shortness of breath.   You have bloody sputum.  You faint.  You have lightheadedness.  You have a severe headache.   You vomit repeatedly. MAKE SURE YOU:   Understand these instructions.  Will watch your condition.  Will get help right away if you are not doing well or get worse. Document Released: 12/25/2004 Document Revised: 10/15/2012 Document Reviewed: 08/19/2012 [facility] Patient Information 2014 [place]. Generalized Anxiety Disorder Generalized anxiety disorder (GAD) is a mental disorder. It interferes with life functions, including relationships, work, and school. GAD is different from normal anxiety, which everyone experiences at some point in their lives in response to specific life events and activities. Normal anxiety actually helps [place] prepare for and get through these life events and activities. Normal anxiety goes away after the event or activity is over.  GAD causes anxiety that is not  necessarily related to specific events or activities. It also causes excess anxiety in proportion to specific events or activities. The anxiety associated with GAD is also difficult to control. GAD can vary from mild to severe. People with severe GAD can have intense  waves of anxiety with physical symptoms (panic attacks).  SYMPTOMS The anxiety and worry associated with GAD are difficult to control. This anxiety and worry are related to many life events and activities and also occur more days than not for 6 months or longer. People with GAD also have three or more of the following symptoms (one or more in children):  Restlessness.   Fatigue.  Difficulty concentrating.   Irritability.  Muscle tension.  Difficulty sleeping or unsatisfying sleep. DIAGNOSIS GAD is diagnosed through an assessment by your caregiver. Your caregiver will ask you questions aboutyour mood,physical symptoms, and events in your life. Your caregiver may ask you about your medical history and use of alcohol or drugs, including prescription medications. Your caregiver may also do a physical exam and blood tests. Certain medical conditions and the use of certain substances can cause symptoms similar to those associated with GAD. Your caregiver may refer you to a mental health specialist for further evaluation. TREATMENT The following therapies are usually used to treat GAD:   Medication Antidepressant medication usually is prescribed for long-term daily control. Antianxiety medications may be added in severe cases, especially when panic attacks occur.   Talk therapy (psychotherapy) Certain types of talk therapy can be helpful in treating GAD by providing support, education, and guidance. A form of talk therapy called cognitive behavioral therapy can teach you healthy ways to think about and react to daily life events and activities.  Stress managementtechniques These include yoga, meditation, and exercise and can be very helpful when they are practiced regularly. A mental health specialist can help determine which treatment is best for you. Some people see improvement with one therapy. However, other people require a combination of therapies. Document Released: 04/21/2012  Document Reviewed: 04/21/2012 [facility] Patient Information 2014 [place], [place].

## 2013-02-10 NOTE — ED Provider Notes (Signed)
Medical screening examination/treatment/procedure(s) were performed by non-physician practitioner and as supervising physician I was immediately available for consultation/collaboration.  EKG Interpretation   None         Osvaldo Shipper, MD 02/10/13 2316

## 2013-02-10 NOTE — ED Notes (Signed)
Pt in with generalized cold symptoms and medication refill of klonopin, paxil.

## 2013-02-10 NOTE — ED Provider Notes (Signed)
CSN: [id number]     Arrival date & time 02/10/13  1904 History   First MD Initiated Contact with Patient 02/10/13 1941     Chief Complaint  Patient presents with  . Shortness of Breath  . Cough  . Nasal Congestion  . Medication Refill   (Consider location/radiation/quality/duration/timing/severity/associated sxs/prior Treatment) Patient is a 48 y.o. female presenting with shortness of breath and cough. The history is provided by the patient. No language interpreter was used.  Shortness of Breath Severity:  Moderate Onset quality:  Gradual Timing:  Constant Progression:  Worsening Chronicity:  New Relieved by:  Nothing Worsened by:  Nothing tried Ineffective treatments:  None tried Associated symptoms: cough   Cough Associated symptoms: shortness of breath   Pt is out of her xanax and paxil.   Pt reports she is uanlbe to see her MD.   Pt also complains of a cough and congestion.   (ED has provided last 2 rx's of xananx and paxil)   Past Medical History  Diagnosis Date  . PE (pulmonary embolism)   . Anxiety   . Diabetes mellitus without complication   . Acid reflux   . Depression   . Adrenal benign tumor    History reviewed. No pertinent past surgical history. History reviewed. No pertinent family history. History  Substance Use Topics  . Smoking status: Current Every Day Smoker -- 1.00 packs/day    Types: Cigarettes  . Smokeless tobacco: Never Used  . Alcohol Use: No   OB History   Grav Para Term Preterm Abortions TAB SAB Ect Mult Living                 Review of Systems  Respiratory: Positive for cough and shortness of breath.   All other systems reviewed and are negative.    Allergies  Amoxicillin; Aspirin; Codeine; and Penicillins  Home Medications   Current Outpatient Rx  Name  Route  Sig  Dispense  Refill  . albuterol (PROVENTIL HFA;VENTOLIN HFA) 108 (90 BASE) MCG/ACT inhaler   Inhalation   Inhale 2 puffs into the lungs every 4 (four) hours as needed  for wheezing or shortness of breath.   1 Inhaler   0   . ALPRAZolam (XANAX) 0.5 MG tablet   Oral   Take 1 tablet (0.5 mg total) by mouth 2 (two) times daily as needed for sleep.   60 tablet   0   . ALPRAZolam (XANAX) 0.5 MG tablet   Oral   Take 1 tablet (0.5 mg total) by mouth 2 (two) times daily as needed for anxiety.   60 tablet   1   . ALPRAZolam (XANAX) 1 MG tablet   Oral   Take 1 tablet (1 mg total) by mouth 2 (two) times daily.   10 tablet   0   . diphenhydramine-acetaminophen (TYLENOL PM) 25-500 MG TABS   Oral   Take 1 tablet by mouth at bedtime as needed.         Marland Kitchen FLUoxetine (PROZAC) 20 MG tablet   Oral   Take 2 tablets (40 mg total) by mouth daily.   60 tablet   1   . metFORMIN (GLUCOPHAGE-XR) 500 MG 24 hr tablet   Oral   Take 1 tablet (500 mg total) by mouth daily with breakfast.   30 tablet   0   . PARoxetine (PAXIL) 20 MG tablet   Oral   Take 2 tablets (40 mg total) by mouth every morning.   Carteret  tablet   0    BP 175/102  Pulse 102  Temp(Src) 98.2 F (36.8 C) (Oral)  Resp 18  Wt 265 lb (120.203 kg)  SpO2 98%  LMP 02/10/2013 Physical Exam  Nursing note and vitals reviewed. Constitutional: She is oriented to person, place, and time. She appears well-developed and well-nourished.  HENT:  Head: Normocephalic and atraumatic.  Right Ear: External ear normal.  Left Ear: External ear normal.  Nose: Nose normal.  Mouth/Throat: Oropharynx is clear and moist.  Eyes: Conjunctivae are normal. Pupils are equal, round, and reactive to light.  Neck: Normal range of motion. Neck supple.  Cardiovascular: Normal rate, regular rhythm, normal heart sounds and intact distal pulses.   Pulmonary/Chest: Effort normal and breath sounds normal.  Abdominal: Soft.  Musculoskeletal: Normal range of motion.  Neurological: She is alert and oriented to person, place, and time. She has normal reflexes.  Skin: Skin is warm.  Psychiatric: She has a normal mood and  affect.    ED Course  Procedures (including critical care time) Labs Review Labs Reviewed - No data to display Imaging Review Dg Chest 2 View  02/10/2013   CLINICAL DATA:  Chest pressure.  EXAM: CHEST  2 VIEW  COMPARISON:  CT chest and plain films of the chest 12/06/2012.  FINDINGS: The lungs are emphysematous but clear. No pneumothorax or pleural effusion is identified. Heart size is normal.  IMPRESSION: Emphysema without acute disease.   Electronically Signed   By: Inge Rise M.D.   On: 02/10/2013 20:30    EKG Interpretation   None       MDM   1. Bronchitis    Pt given referral to wellness clinic.   I advised her she needs to be managed by primary care.   I gave 10 xananx as I a concerned pt will have withdrawl   Fransico Meadow, PA-C 02/10/13 2059  [place], [place] 02/10/13 2104

## 2013-02-26 ENCOUNTER — Emergency Department (HOSPITAL_BASED_OUTPATIENT_CLINIC_OR_DEPARTMENT_OTHER)
Admission: EM | Admit: 2013-02-26 | Discharge: 2013-02-26 | Disposition: A | Discharge status: Home / Self Care | Payer: PRIVATE HEALTH INSURANCE | Attending: Emergency Medicine | Admitting: Emergency Medicine

## 2013-02-26 ENCOUNTER — Emergency Department (HOSPITAL_BASED_OUTPATIENT_CLINIC_OR_DEPARTMENT_OTHER): Payer: PRIVATE HEALTH INSURANCE

## 2013-02-26 ENCOUNTER — Encounter (HOSPITAL_BASED_OUTPATIENT_CLINIC_OR_DEPARTMENT_OTHER): Payer: Self-pay | Admitting: Emergency Medicine

## 2013-02-26 DIAGNOSIS — F3289 Other specified depressive episodes: Secondary | ICD-10-CM | POA: Insufficient documentation

## 2013-02-26 DIAGNOSIS — F411 Generalized anxiety disorder: Secondary | ICD-10-CM | POA: Insufficient documentation

## 2013-02-26 DIAGNOSIS — J209 Acute bronchitis, unspecified: Secondary | ICD-10-CM

## 2013-02-26 DIAGNOSIS — Z79899 Other long term (current) drug therapy: Secondary | ICD-10-CM | POA: Insufficient documentation

## 2013-02-26 DIAGNOSIS — Z87448 Personal history of other diseases of urinary system: Secondary | ICD-10-CM | POA: Insufficient documentation

## 2013-02-26 DIAGNOSIS — Z792 Long term (current) use of antibiotics: Secondary | ICD-10-CM | POA: Insufficient documentation

## 2013-02-26 DIAGNOSIS — F329 Major depressive disorder, single episode, unspecified: Secondary | ICD-10-CM | POA: Insufficient documentation

## 2013-02-26 DIAGNOSIS — F17200 Nicotine dependence, unspecified, uncomplicated: Secondary | ICD-10-CM | POA: Insufficient documentation

## 2013-02-26 DIAGNOSIS — Z880 Allergy status to penicillin: Secondary | ICD-10-CM | POA: Insufficient documentation

## 2013-02-26 DIAGNOSIS — Z86711 Personal history of pulmonary embolism: Secondary | ICD-10-CM | POA: Insufficient documentation

## 2013-02-26 DIAGNOSIS — E119 Type 2 diabetes mellitus without complications: Secondary | ICD-10-CM | POA: Insufficient documentation

## 2013-02-26 DIAGNOSIS — Z8719 Personal history of other diseases of the digestive system: Secondary | ICD-10-CM | POA: Insufficient documentation

## 2013-02-26 LAB — COMPREHENSIVE METABOLIC PANEL
ALT: 12 U/L (ref 0–35)
AST: 13 U/L (ref 0–37)
Albumin: 4 g/dL (ref 3.5–5.2)
Alkaline Phosphatase: 68 U/L (ref 39–117)
BUN: 8 mg/dL (ref 6–23)
CALCIUM: 9.3 mg/dL (ref 8.4–10.5)
CO2: 21 meq/L (ref 19–32)
CREATININE: 1 mg/dL (ref 0.50–1.10)
Chloride: 98 mEq/L (ref 96–112)
GFR calc Af Amer: 76 mL/min — ABNORMAL LOW (ref >90)
GFR, EST NON AFRICAN AMERICAN: 66 mL/min — AB (ref >90)
GLUCOSE: 180 mg/dL — AB (ref 70–99)
Potassium: 4.1 mEq/L (ref 3.7–5.3)
Sodium: 136 mEq/L — ABNORMAL LOW (ref 137–147)
Total Bilirubin: 0.3 mg/dL (ref 0.3–1.2)
Total Protein: 7.6 g/dL (ref 6.0–8.3)

## 2013-02-26 LAB — CBC WITH DIFFERENTIAL/PLATELET
BASOS ABS: 0 10*3/uL (ref 0.0–0.1)
Basophils Relative: 0 % (ref 0–1)
Eosinophils Absolute: 0.1 10*3/uL (ref 0.0–0.7)
Eosinophils Relative: 1 % (ref 0–5)
HCT: 47.4 % — ABNORMAL HIGH (ref 36.0–46.0)
Hemoglobin: 15.8 g/dL — ABNORMAL HIGH (ref 12.0–15.0)
LYMPHS ABS: 3.4 10*3/uL (ref 0.7–4.0)
Lymphocytes Relative: 24 % (ref 12–46)
MCH: 32.2 pg (ref 26.0–34.0)
MCHC: 33.3 g/dL (ref 30.0–36.0)
MCV: 96.7 fL (ref 78.0–100.0)
MONO ABS: 0.8 10*3/uL (ref 0.1–1.0)
Monocytes Relative: 5 % (ref 3–12)
Neutro Abs: 9.9 10*3/uL — ABNORMAL HIGH (ref 1.7–7.7)
Neutrophils Relative %: 70 % (ref 43–77)
Platelets: 243 10*3/uL (ref 150–400)
RBC: 4.9 MIL/uL (ref 3.87–5.11)
RDW: 13.4 % (ref 11.5–15.5)
WBC: 14.2 10*3/uL — ABNORMAL HIGH (ref 4.0–10.5)

## 2013-02-26 LAB — TROPONIN I: Troponin I: <0.3 ng/mL (ref <0.30)

## 2013-02-26 LAB — D-DIMER, QUANTITATIVE (NOT AT ARMC): D DIMER QUANT: 0.54 ug{FEU}/mL — AB (ref 0.00–0.48)

## 2013-02-26 MED ORDER — ALBUTEROL SULFATE (2.5 MG/3ML) 0.083% IN NEBU
2.5000 mg | INHALATION_SOLUTION | Freq: Once | RESPIRATORY_TRACT | Status: AC
  Administered 2013-02-26: 2.5 mg via RESPIRATORY_TRACT
  Filled 2013-02-26: qty 3

## 2013-02-26 MED ORDER — ALPRAZOLAM 0.5 MG PO TABS
1.0000 mg | ORAL_TABLET | Freq: Once | ORAL | Status: AC
  Administered 2013-02-26: 1 mg via ORAL
  Filled 2013-02-26: qty 2

## 2013-02-26 MED ORDER — PREDNISONE 10 MG PO TABS: 20.0000 mg | ORAL_TABLET | Freq: Two times a day (BID) | ORAL | Status: DC

## 2013-02-26 MED ORDER — TRAMADOL HCL 50 MG PO TABS
50.0000 mg | ORAL_TABLET | Freq: Once | ORAL | Status: AC
  Administered 2013-02-26: 50 mg via ORAL
  Filled 2013-02-26: qty 1

## 2013-02-26 MED ORDER — HYDROCOD POLST-CHLORPHEN POLST 10-8 MG/5ML PO LQCR: 5.0000 mL | Freq: Two times a day (BID) | ORAL | Status: DC | PRN

## 2013-02-26 MED ORDER — DOXYCYCLINE HYCLATE 100 MG PO CAPS: 100.0000 mg | ORAL_CAPSULE | Freq: Two times a day (BID) | ORAL | Status: DC

## 2013-02-26 MED ORDER — IPRATROPIUM-ALBUTEROL 0.5-2.5 (3) MG/3ML IN SOLN
3.0000 mL | Freq: Once | RESPIRATORY_TRACT | Status: AC
  Administered 2013-02-26: 3 mL via RESPIRATORY_TRACT
  Filled 2013-02-26: qty 3

## 2013-02-26 MED ORDER — IPRATROPIUM BROMIDE 0.02 % IN SOLN: 0.5000 mg | Freq: Once | RESPIRATORY_TRACT | Status: DC

## 2013-02-26 MED ORDER — ALBUTEROL SULFATE (2.5 MG/3ML) 0.083% IN NEBU: 5.0000 mg | INHALATION_SOLUTION | Freq: Once | RESPIRATORY_TRACT | Status: DC

## 2013-02-26 MED ORDER — PAROXETINE HCL 20 MG PO TABS: 40.0000 mg | ORAL_TABLET | ORAL | Status: DC

## 2013-02-26 MED ORDER — ALPRAZOLAM 1 MG PO TABS: 28.0000 mg | ORAL_TABLET | Freq: Two times a day (BID) | ORAL | Status: DC

## 2013-02-26 NOTE — Discharge Instructions (Signed)
Doxycycline as prescribed. Prednisone as prescribed. Tussionex as prescribed as needed for cough.  Return to the emergency department if you develop severe chest pain or difficulty breathing. Followup with a primary care Dr. for future medication refills.   Acute Bronchitis Bronchitis is inflammation of the airways that extend from the windpipe into the lungs (bronchi). The inflammation often causes mucus to develop. This leads to a cough, which is the most common symptom of bronchitis.  In acute bronchitis, the condition usually develops suddenly and goes away over time, usually in a couple weeks. Smoking, allergies, and asthma can make bronchitis worse. Repeated episodes of bronchitis may cause further lung problems.  CAUSES Acute bronchitis is most often caused by the same virus that causes a cold. The virus can spread from person to person (contagious).  SIGNS AND SYMPTOMS   Cough.   Fever.   Coughing up mucus.   Body aches.   Chest congestion.   Chills.   Shortness of breath.   Sore throat.  DIAGNOSIS  Acute bronchitis is usually diagnosed through a physical exam. Tests, such as chest X-rays, are sometimes done to rule out other conditions.  TREATMENT  Acute bronchitis usually goes away in a couple weeks. Often times, no medical treatment is necessary. Medicines are sometimes given for relief of fever or cough. Antibiotics are usually not needed but may be prescribed in certain situations. In some cases, an inhaler may be recommended to help reduce shortness of breath and control the cough. A cool mist vaporizer may also be used to help thin bronchial secretions and make it easier to clear the chest.  HOME CARE INSTRUCTIONS  Get plenty of rest.   Drink enough fluids to keep your urine clear or pale yellow (unless you have a medical condition that requires fluid restriction). Increasing fluids may help thin your secretions and will prevent dehydration.   Only take  over-the-counter or prescription medicines as directed by your health care provider.   Avoid smoking and secondhand smoke. Exposure to cigarette smoke or irritating chemicals will make bronchitis worse. If you are a smoker, consider using nicotine gum or skin patches to help control withdrawal symptoms. Quitting smoking will help your lungs heal faster.   Reduce the chances of another bout of acute bronchitis by washing your hands frequently, avoiding people with cold symptoms, and trying not to touch your hands to your mouth, nose, or eyes.   Follow up with your health care provider as directed.  SEEK MEDICAL CARE IF: Your symptoms do not improve after 1 week of treatment.  SEEK IMMEDIATE MEDICAL CARE IF:  You develop an increased fever or chills.   You have chest pain.   You have severe shortness of breath.  You have bloody sputum.   You develop dehydration.  You develop fainting.  You develop repeated vomiting.  You develop a severe headache. MAKE SURE YOU:   Understand these instructions.  Will watch your condition.  Will get help right away if you are not doing well or get worse. Document Released: 02/02/2004 Document Revised: 08/27/2012 Document Reviewed: 06/17/2012 [facility] Patient Information 2014 [place].

## 2013-02-26 NOTE — ED Notes (Signed)
Patient transported to X-ray 

## 2013-02-26 NOTE — ED Notes (Signed)
Pt sts she was treated here 2 weeks ago for bronchitis.  Sts she took her med but is not any better.  Central, nonradiating, intermittent CP x2 days.  Also trembling.  Sts she has been out of her Xanax x4 days and "I get like this when I am out of it."

## 2013-02-26 NOTE — ED Provider Notes (Signed)
CSN: [id number]     Arrival date & time 02/26/13  1546 History   First MD Initiated Contact with Patient 02/26/13 1615     Chief Complaint  Patient presents with  . Chest Pain     (Consider location/radiation/quality/duration/timing/severity/associated sxs/prior Treatment) HPI Comments: Patient is a 48 year old female with history of pulmonary embolism, diabetes, anxiety. She presents today with complaints of tightness in her chest and congestion for the past several weeks. She was seen here 2 weeks ago and put on Zithromax which has not helped. She feels as though she has to cough but she cannot cough it up. This feels different than her prior pulmonary embolism symptoms. She smokes one pack per day and continues to do so.  Patient is a 48 y.o. female presenting with chest pain. The history is provided by the patient.  Chest Pain Pain location:  Substernal area Pain quality: tightness   Pain radiates to:  Does not radiate Pain radiates to the back: no   Pain severity:  Moderate Onset quality:  Sudden Timing:  Constant Progression:  Worsening Chronicity:  New Context: breathing   Relieved by:  Nothing Worsened by:  Nothing tried   Past Medical History  Diagnosis Date  . PE (pulmonary embolism)   . Anxiety   . Diabetes mellitus without complication   . Acid reflux   . Depression   . Adrenal benign tumor    History reviewed. No pertinent past surgical history. No family history on file. History  Substance Use Topics  . Smoking status: Current Every Day Smoker -- 1.00 packs/day    Types: Cigarettes  . Smokeless tobacco: Never Used  . Alcohol Use: No   OB History   Grav Para Term Preterm Abortions TAB SAB Ect Mult Living                 Review of Systems  Cardiovascular: Positive for chest pain.  All other systems reviewed and are negative.      Allergies  Amoxicillin; Aspirin; Codeine; and Penicillins  Home Medications   Current Outpatient Rx  Name  Route   Sig  Dispense  Refill  . ALPRAZolam (XANAX) 1 MG tablet   Oral   Take 1 tablet (1 mg total) by mouth 2 (two) times daily.   10 tablet   0   . albuterol (PROVENTIL HFA;VENTOLIN HFA) 108 (90 BASE) MCG/ACT inhaler   Inhalation   Inhale 2 puffs into the lungs every 4 (four) hours as needed for wheezing or shortness of breath.   1 Inhaler   0   . ALPRAZolam (XANAX) 0.5 MG tablet   Oral   Take 1 tablet (0.5 mg total) by mouth 2 (two) times daily as needed for sleep.   60 tablet   0   . ALPRAZolam (XANAX) 0.5 MG tablet   Oral   Take 1 tablet (0.5 mg total) by mouth 2 (two) times daily as needed for anxiety.   60 tablet   1   . azithromycin (ZITHROMAX Z-PAK) 250 MG tablet      Two tabets 1st day then one a day for days 2-5   6 tablet   0   . diphenhydramine-acetaminophen (TYLENOL PM) 25-500 MG TABS   Oral   Take 1 tablet by mouth at bedtime as needed.         Marland Kitchen FLUoxetine (PROZAC) 20 MG tablet   Oral   Take 2 tablets (40 mg total) by mouth daily.   60 tablet  1   . metFORMIN (GLUCOPHAGE-XR) 500 MG 24 hr tablet   Oral   Take 1 tablet (500 mg total) by mouth daily with breakfast.   30 tablet   0   . PARoxetine (PAXIL) 20 MG tablet   Oral   Take 2 tablets (40 mg total) by mouth every morning.   30 tablet   0    BP 161/90  Pulse 117  Temp(Src) 97.7 F (36.5 C) (Oral)  Resp 24  Ht 5\' 6"  (1.676 m)  Wt 260 lb (117.935 kg)  BMI 41.99 kg/m2  SpO2 97%  LMP 02/10/2013 Physical Exam  Nursing note and vitals reviewed. Constitutional: She is oriented to person, place, and time. She appears well-developed and well-nourished. No distress.  HENT:  Head: Normocephalic and atraumatic.  Neck: Normal range of motion. Neck supple.  Cardiovascular: Normal rate and regular rhythm.  Exam reveals no gallop and no friction rub.   No murmur heard. Pulmonary/Chest: Effort normal. No respiratory distress. She has wheezes. She has no rales.  There are slight expiratory wheezes  bilaterally.  Abdominal: Soft. Bowel sounds are normal. She exhibits no distension. There is no tenderness.  Musculoskeletal: Normal range of motion.  Neurological: She is alert and oriented to person, place, and time.  Skin: Skin is warm and dry. She is not diaphoretic.    ED Course  Procedures (including critical care time) Labs Review Labs Reviewed  CBC WITH DIFFERENTIAL  COMPREHENSIVE METABOLIC PANEL  TROPONIN I  D-DIMER, QUANTITATIVE   Imaging Review No results found.  EKG Interpretation    Date/Time:  Thursday February 26 2013 15:56:57 EST Ventricular Rate:  114 PR Interval:  128 QRS Duration: 64 QT Interval:  336 QTC Calculation: 463 R Axis:   51 Text Interpretation:  Sinus tachycardia Low voltage QRS Borderline ECG Confirmed by DELOS  MD, Laureen Frederic ([id number]) on 02/26/2013 4:18:38 PM            MDM   Final diagnoses:  None   Patient is a 48 year old female who presents with chest congestion and tightness for the past 3 weeks. She was seen here 2 weeks ago and diagnosed with bronchitis. She was given Zithromax however has not improved. Her chest x-ray does not reveal any acute abnormality, and there is no elevation of white count. I doubt pneumonia. Her troponin is negative and EKG shows a normal sinus rhythm. I doubt a cardiac etiology. She has a history of pulmonary embolism and today's d-dimer was 0.54 which is only minimally elevated. Upon reviewing her record she has had a negative CT angiogram of the chest x3 and the past several months for d-dimer is higher than this. I strongly doubt pulmonary embolism. I suspect the patient's largest problem is the fact she continues to smoke a pack per day. I've recommended she seriously entertain the possibility of stopping smoking. For now, I will prescribe another round of antibiotics, prednisone, and cough syrup. She is also requested that I fill her Paxil and Xanax which I have agreed to do. She is between doctors but states  that she will be establishing care within the next 2 weeks.   Veryl Speak, MD 02/26/13 [phone]

## 2013-03-28 ENCOUNTER — Encounter (HOSPITAL_BASED_OUTPATIENT_CLINIC_OR_DEPARTMENT_OTHER): Payer: Self-pay | Admitting: Emergency Medicine

## 2013-03-28 ENCOUNTER — Emergency Department (HOSPITAL_BASED_OUTPATIENT_CLINIC_OR_DEPARTMENT_OTHER)
Admission: EM | Admit: 2013-03-28 | Discharge: 2013-03-28 | Disposition: A | Discharge status: Home / Self Care | Payer: No Typology Code available for payment source | Attending: Emergency Medicine | Admitting: Emergency Medicine

## 2013-03-28 DIAGNOSIS — Z86711 Personal history of pulmonary embolism: Secondary | ICD-10-CM | POA: Insufficient documentation

## 2013-03-28 DIAGNOSIS — J069 Acute upper respiratory infection, unspecified: Secondary | ICD-10-CM | POA: Insufficient documentation

## 2013-03-28 DIAGNOSIS — E119 Type 2 diabetes mellitus without complications: Secondary | ICD-10-CM | POA: Insufficient documentation

## 2013-03-28 DIAGNOSIS — F17200 Nicotine dependence, unspecified, uncomplicated: Secondary | ICD-10-CM | POA: Insufficient documentation

## 2013-03-28 DIAGNOSIS — F329 Major depressive disorder, single episode, unspecified: Secondary | ICD-10-CM | POA: Insufficient documentation

## 2013-03-28 DIAGNOSIS — F411 Generalized anxiety disorder: Secondary | ICD-10-CM | POA: Insufficient documentation

## 2013-03-28 DIAGNOSIS — F3289 Other specified depressive episodes: Secondary | ICD-10-CM | POA: Insufficient documentation

## 2013-03-28 DIAGNOSIS — Z8719 Personal history of other diseases of the digestive system: Secondary | ICD-10-CM | POA: Insufficient documentation

## 2013-03-28 DIAGNOSIS — Z79899 Other long term (current) drug therapy: Secondary | ICD-10-CM | POA: Insufficient documentation

## 2013-03-28 DIAGNOSIS — Z880 Allergy status to penicillin: Secondary | ICD-10-CM | POA: Insufficient documentation

## 2013-03-28 MED ORDER — ALPRAZOLAM 0.5 MG PO TABS
1.0000 mg | ORAL_TABLET | Freq: Once | ORAL | Status: AC
  Administered 2013-03-28: 1 mg via ORAL
  Filled 2013-03-28: qty 2

## 2013-03-28 NOTE — ED Notes (Signed)
Patient here with ongoing cough and congestion for the past several days, taking meds as prescribed, no distress

## 2013-03-28 NOTE — Discharge Instructions (Signed)

## 2013-03-28 NOTE — ED Provider Notes (Signed)
CSN: [id number]     Arrival date & time 03/28/13  1244 History   First MD Initiated Contact with Patient 03/28/13 1300     No chief complaint on file.    (Consider location/radiation/quality/duration/timing/severity/associated sxs/prior Treatment) Patient is a 48 y.o. female presenting with URI. The history is provided by the patient.  URI Presenting symptoms: rhinorrhea and sore throat   Presenting symptoms: no cough and no fever   Presenting symptoms comment:  Otalgia Severity:  Mild Onset quality:  Gradual Duration:  3 days Timing:  Constant Progression:  Unchanged Chronicity:  New Relieved by:  Nothing Worsened by:  Nothing tried Associated symptoms: sinus pain   Associated symptoms: no arthralgias, no headaches, no myalgias, no sneezing and no wheezing   Risk factors: no recent illness     Past Medical History  Diagnosis Date  . PE (pulmonary embolism)   . Anxiety   . Diabetes mellitus without complication   . Acid reflux   . Depression   . Adrenal benign tumor    History reviewed. No pertinent past surgical history. No family history on file. History  Substance Use Topics  . Smoking status: Current Every Day Smoker -- 1.00 packs/day    Types: Cigarettes  . Smokeless tobacco: Never Used  . Alcohol Use: No   OB History   Grav Para Term Preterm Abortions TAB SAB Ect Mult Living                 Review of Systems  Constitutional: Negative for fever and chills.  HENT: Positive for rhinorrhea and sore throat. Negative for sneezing.   Respiratory: Negative for cough, shortness of breath and wheezing.   Musculoskeletal: Negative for arthralgias and myalgias.  Neurological: Negative for headaches.  All other systems reviewed and are negative.      Allergies  Amoxicillin; Aspirin; Codeine; and Penicillins  Home Medications   Current Outpatient Rx  Name  Route  Sig  Dispense  Refill  . albuterol (PROVENTIL HFA;VENTOLIN HFA) 108 (90 BASE) MCG/ACT inhaler   Inhalation   Inhale 2 puffs into the lungs every 4 (four) hours as needed for wheezing or shortness of breath.   1 Inhaler   0   . ALPRAZolam (XANAX) 0.5 MG tablet   Oral   Take 1 tablet (0.5 mg total) by mouth 2 (two) times daily as needed for sleep.   60 tablet   0   . ALPRAZolam (XANAX) 0.5 MG tablet   Oral   Take 1 tablet (0.5 mg total) by mouth 2 (two) times daily as needed for anxiety.   60 tablet   1   . ALPRAZolam (XANAX) 1 MG tablet   Oral   Take 28 tablets (28 mg total) by mouth 2 (two) times daily.   28 tablet   0   . azithromycin (ZITHROMAX Z-PAK) 250 MG tablet      Two tabets 1st day then one a day for days 2-5   6 tablet   0   . chlorpheniramine-HYDROcodone (TUSSIONEX PENNKINETIC ER) 10-8 MG/5ML LQCR   Oral   Take 5 mLs by mouth every 12 (twelve) hours as needed for cough.   115 mL   0   . diphenhydramine-acetaminophen (TYLENOL PM) 25-500 MG TABS   Oral   Take 1 tablet by mouth at bedtime as needed.         . doxycycline (VIBRAMYCIN) 100 MG capsule   Oral   Take 1 capsule (100 mg total) by mouth  2 (two) times daily. One po bid x 7 days   14 capsule   0   . FLUoxetine (PROZAC) 20 MG tablet   Oral   Take 2 tablets (40 mg total) by mouth daily.   60 tablet   1   . metFORMIN (GLUCOPHAGE-XR) 500 MG 24 hr tablet   Oral   Take 1 tablet (500 mg total) by mouth daily with breakfast.   30 tablet   0   . PARoxetine (PAXIL) 20 MG tablet   Oral   Take 2 tablets (40 mg total) by mouth every morning.   30 tablet   0   . predniSONE (DELTASONE) 10 MG tablet   Oral   Take 2 tablets (20 mg total) by mouth 2 (two) times daily.   20 tablet   0    BP 135/92  Pulse 107  Temp(Src) 98.6 F (37 C) (Oral)  Resp 18  Ht 5\' 6"  (1.676 m)  Wt 260 lb (117.935 kg)  BMI 41.99 kg/m2  SpO2 100% Physical Exam  Nursing note and vitals reviewed. Constitutional: She is oriented to person, place, and time. She appears well-developed and well-nourished. No  distress.  HENT:  Head: Normocephalic and atraumatic.  Right Ear: Tympanic membrane normal.  Left Ear: Tympanic membrane normal.  Eyes: EOM are normal. Pupils are equal, round, and reactive to light.  Neck: Normal range of motion. Neck supple.  Cardiovascular: Normal rate and regular rhythm.  Exam reveals no friction rub.   No murmur heard. Pulmonary/Chest: Effort normal and breath sounds normal. No respiratory distress. She has no wheezes. She has no rales.  Abdominal: Soft. She exhibits no distension. There is no tenderness. There is no rebound.  Musculoskeletal: Normal range of motion. She exhibits no edema.  Neurological: She is alert and oriented to person, place, and time. No cranial nerve deficit. She exhibits normal muscle tone. Coordination normal.  Skin: No rash noted. She is not diaphoretic.    ED Course  Procedures (including critical care time) Labs Review Labs Reviewed - No data to display Imaging Review No results found.   EKG Interpretation None      MDM   Final diagnoses:  URI, acute    57F presents with URI symptoms - otalgia, ear fullness, sore throat, sinus congestion. Recent antibiotics for bronchitis. No cough, no SOB. No trouble with swallowing. No headaches or fever. Exam benign. Clinical picture c/w acute URI, instructed to continue supportive care. Patient given Xanax because she states being stressed out - no refill provided.    Osvaldo Shipper, MD 03/28/13 [phone]

## 2013-03-30 ENCOUNTER — Emergency Department (HOSPITAL_BASED_OUTPATIENT_CLINIC_OR_DEPARTMENT_OTHER)
Admission: EM | Admit: 2013-03-30 | Discharge: 2013-03-30 | Disposition: A | Discharge status: Home / Self Care | Payer: No Typology Code available for payment source | Attending: Emergency Medicine | Admitting: Emergency Medicine

## 2013-03-30 ENCOUNTER — Encounter (HOSPITAL_BASED_OUTPATIENT_CLINIC_OR_DEPARTMENT_OTHER): Payer: Self-pay | Admitting: Emergency Medicine

## 2013-03-30 DIAGNOSIS — Z760 Encounter for issue of repeat prescription: Secondary | ICD-10-CM | POA: Insufficient documentation

## 2013-03-30 DIAGNOSIS — F17200 Nicotine dependence, unspecified, uncomplicated: Secondary | ICD-10-CM | POA: Insufficient documentation

## 2013-03-30 DIAGNOSIS — Z79899 Other long term (current) drug therapy: Secondary | ICD-10-CM | POA: Insufficient documentation

## 2013-03-30 DIAGNOSIS — Z86711 Personal history of pulmonary embolism: Secondary | ICD-10-CM | POA: Insufficient documentation

## 2013-03-30 DIAGNOSIS — F3289 Other specified depressive episodes: Secondary | ICD-10-CM | POA: Insufficient documentation

## 2013-03-30 DIAGNOSIS — F411 Generalized anxiety disorder: Secondary | ICD-10-CM | POA: Insufficient documentation

## 2013-03-30 DIAGNOSIS — Z880 Allergy status to penicillin: Secondary | ICD-10-CM | POA: Insufficient documentation

## 2013-03-30 DIAGNOSIS — E119 Type 2 diabetes mellitus without complications: Secondary | ICD-10-CM | POA: Insufficient documentation

## 2013-03-30 DIAGNOSIS — F329 Major depressive disorder, single episode, unspecified: Secondary | ICD-10-CM | POA: Insufficient documentation

## 2013-03-30 DIAGNOSIS — F39 Unspecified mood [affective] disorder: Secondary | ICD-10-CM | POA: Insufficient documentation

## 2013-03-30 DIAGNOSIS — IMO0002 Reserved for concepts with insufficient information to code with codable children: Secondary | ICD-10-CM | POA: Insufficient documentation

## 2013-03-30 DIAGNOSIS — Z8719 Personal history of other diseases of the digestive system: Secondary | ICD-10-CM | POA: Insufficient documentation

## 2013-03-30 MED ORDER — ALPRAZOLAM 0.5 MG PO TABS
1.0000 mg | ORAL_TABLET | Freq: Once | ORAL | Status: AC
  Administered 2013-03-30: 1 mg via ORAL
  Filled 2013-03-30: qty 2

## 2013-03-30 MED ORDER — PAROXETINE HCL 20 MG PO TABS: 40.0000 mg | ORAL_TABLET | Freq: Every day | ORAL | Status: DC

## 2013-03-30 NOTE — ED Provider Notes (Signed)
CSN: [id number]     Arrival date & time 03/30/13  1117 History   First MD Initiated Contact with Patient 03/30/13 1208     Chief Complaint  Patient presents with  . Medication Refill     (Consider location/radiation/quality/duration/timing/severity/associated sxs/prior Treatment) HPI Comments: Patient is a 48 year old female with a past medical history of diabetes, depression, anxiety who presents requesting a refill of her xanax and paxil. Patient reports she has been without her medications for 1 week and "really needs" them. Patient reports she has been unable to follow up with her PCP because there was some trouble with her insurance that has since been resolved. Patient's father-in-law has terminal brain cancer and she "cannot handle it." Patient denies any other associated symptoms. She was in the ED 2 days ago requesting a medication refill for the same. She was given 1 Xanax tab here with no refill.    Past Medical History  Diagnosis Date  . PE (pulmonary embolism)   . Anxiety   . Diabetes mellitus without complication   . Acid reflux   . Depression   . Adrenal benign tumor    History reviewed. No pertinent past surgical history. No family history on file. History  Substance Use Topics  . Smoking status: Current Every Day Smoker -- 1.00 packs/day    Types: Cigarettes  . Smokeless tobacco: Never Used  . Alcohol Use: No   OB History   Grav Para Term Preterm Abortions TAB SAB Ect Mult Living                 Review of Systems  Constitutional: Negative for fever, chills and fatigue.  HENT: Negative for trouble swallowing.   Eyes: Negative for visual disturbance.  Respiratory: Negative for shortness of breath.   Cardiovascular: Negative for chest pain and palpitations.  Gastrointestinal: Negative for nausea, vomiting, abdominal pain and diarrhea.  Genitourinary: Negative for dysuria and difficulty urinating.  Musculoskeletal: Negative for arthralgias and neck pain.   Skin: Negative for color change.  Neurological: Negative for dizziness and weakness.  Psychiatric/Behavioral: Positive for dysphoric mood. The patient is nervous/anxious.       Allergies  Amoxicillin; Aspirin; Codeine; and Penicillins  Home Medications   Current Outpatient Rx  Name  Route  Sig  Dispense  Refill  . albuterol (PROVENTIL HFA;VENTOLIN HFA) 108 (90 BASE) MCG/ACT inhaler   Inhalation   Inhale 2 puffs into the lungs every 4 (four) hours as needed for wheezing or shortness of breath.   1 Inhaler   0   . ALPRAZolam (XANAX) 0.5 MG tablet   Oral   Take 1 tablet (0.5 mg total) by mouth 2 (two) times daily as needed for sleep.   60 tablet   0   . ALPRAZolam (XANAX) 0.5 MG tablet   Oral   Take 1 tablet (0.5 mg total) by mouth 2 (two) times daily as needed for anxiety.   60 tablet   1   . ALPRAZolam (XANAX) 1 MG tablet   Oral   Take 28 tablets (28 mg total) by mouth 2 (two) times daily.   28 tablet   0   . azithromycin (ZITHROMAX Z-PAK) 250 MG tablet      Two tabets 1st day then one a day for days 2-5   6 tablet   0   . chlorpheniramine-HYDROcodone (TUSSIONEX PENNKINETIC ER) 10-8 MG/5ML LQCR   Oral   Take 5 mLs by mouth every 12 (twelve) hours as needed for  cough.   115 mL   0   . diphenhydramine-acetaminophen (TYLENOL PM) 25-500 MG TABS   Oral   Take 1 tablet by mouth at bedtime as needed.         . doxycycline (VIBRAMYCIN) 100 MG capsule   Oral   Take 1 capsule (100 mg total) by mouth 2 (two) times daily. One po bid x 7 days   14 capsule   0   . FLUoxetine (PROZAC) 20 MG tablet   Oral   Take 2 tablets (40 mg total) by mouth daily.   60 tablet   1   . metFORMIN (GLUCOPHAGE-XR) 500 MG 24 hr tablet   Oral   Take 1 tablet (500 mg total) by mouth daily with breakfast.   30 tablet   0   . PARoxetine (PAXIL) 20 MG tablet   Oral   Take 2 tablets (40 mg total) by mouth every morning.   30 tablet   0   . predniSONE (DELTASONE) 10 MG tablet    Oral   Take 2 tablets (20 mg total) by mouth 2 (two) times daily.   20 tablet   0    BP 135/82  Pulse 110  Temp(Src) 98.7 F (37.1 C) (Oral)  Resp 20  Ht 5\' 6"  (1.676 m)  Wt 260 lb (117.935 kg)  BMI 41.99 kg/m2  SpO2 99% Physical Exam  Nursing note and vitals reviewed. Constitutional: She appears well-developed and well-nourished. No distress.  HENT:  Head: Normocephalic and atraumatic.  Eyes: Conjunctivae and EOM are normal.  Neck: Normal range of motion.  Cardiovascular: Normal rate and regular rhythm.  Exam reveals no gallop and no friction rub.   No murmur heard. Pulmonary/Chest: Effort normal and breath sounds normal. She has no wheezes. She has no rales. She exhibits no tenderness.  Abdominal: Soft. There is no tenderness.  Musculoskeletal: Normal range of motion.  Neurological: She is alert.  Speech is goal-oriented. Moves limbs without ataxia.   Skin: Skin is warm and dry.  Psychiatric: Her behavior is normal.  Patient is tearful.     ED Course  Procedures (including critical care time) Labs Review Labs Reviewed - No data to display Imaging Review No results found.   EKG Interpretation None      MDM   Final diagnoses:  Medication refill    12:36 PM Patient will have 1 xanax here and no refills. Patient will have a paxil refill. Patient is tachycardic likely due to being upset. Remaining vitals stable. Patient advised to follow up with her PCP.    Alvina Chou, PA-C 03/30/13 1252

## 2013-03-30 NOTE — Discharge Instructions (Signed)
Take your paxil as directed. Follow up with your doctor for refills. Refer to attached documents for more information.

## 2013-03-30 NOTE — ED Notes (Signed)
Pt sts she needs her "nerve pills" refilled. Pt sts she was here the other day and the Dr would not fill the prescriptions.

## 2013-03-31 NOTE — ED Provider Notes (Signed)
Medical screening examination/treatment/procedure(s) were performed by non-physician practitioner and as supervising physician I was immediately available for consultation/collaboration.     Veryl Speak, MD 03/31/13 [phone]

## 2013-04-02 ENCOUNTER — Encounter (HOSPITAL_BASED_OUTPATIENT_CLINIC_OR_DEPARTMENT_OTHER): Payer: Self-pay | Admitting: Emergency Medicine

## 2013-04-02 ENCOUNTER — Emergency Department (HOSPITAL_BASED_OUTPATIENT_CLINIC_OR_DEPARTMENT_OTHER): Payer: No Typology Code available for payment source

## 2013-04-02 ENCOUNTER — Emergency Department (HOSPITAL_BASED_OUTPATIENT_CLINIC_OR_DEPARTMENT_OTHER)
Admission: EM | Admit: 2013-04-02 | Discharge: 2013-04-02 | Disposition: A | Discharge status: Home / Self Care | Payer: No Typology Code available for payment source | Attending: Emergency Medicine | Admitting: Emergency Medicine

## 2013-04-02 DIAGNOSIS — F329 Major depressive disorder, single episode, unspecified: Secondary | ICD-10-CM | POA: Insufficient documentation

## 2013-04-02 DIAGNOSIS — Z87448 Personal history of other diseases of urinary system: Secondary | ICD-10-CM | POA: Insufficient documentation

## 2013-04-02 DIAGNOSIS — F17200 Nicotine dependence, unspecified, uncomplicated: Secondary | ICD-10-CM | POA: Insufficient documentation

## 2013-04-02 DIAGNOSIS — IMO0002 Reserved for concepts with insufficient information to code with codable children: Secondary | ICD-10-CM | POA: Insufficient documentation

## 2013-04-02 DIAGNOSIS — Z880 Allergy status to penicillin: Secondary | ICD-10-CM | POA: Insufficient documentation

## 2013-04-02 DIAGNOSIS — F411 Generalized anxiety disorder: Secondary | ICD-10-CM | POA: Insufficient documentation

## 2013-04-02 DIAGNOSIS — Z79899 Other long term (current) drug therapy: Secondary | ICD-10-CM | POA: Insufficient documentation

## 2013-04-02 DIAGNOSIS — Z8719 Personal history of other diseases of the digestive system: Secondary | ICD-10-CM | POA: Insufficient documentation

## 2013-04-02 DIAGNOSIS — Z86711 Personal history of pulmonary embolism: Secondary | ICD-10-CM | POA: Insufficient documentation

## 2013-04-02 DIAGNOSIS — Z792 Long term (current) use of antibiotics: Secondary | ICD-10-CM | POA: Insufficient documentation

## 2013-04-02 DIAGNOSIS — M79609 Pain in unspecified limb: Secondary | ICD-10-CM | POA: Insufficient documentation

## 2013-04-02 DIAGNOSIS — F3289 Other specified depressive episodes: Secondary | ICD-10-CM | POA: Insufficient documentation

## 2013-04-02 DIAGNOSIS — E119 Type 2 diabetes mellitus without complications: Secondary | ICD-10-CM | POA: Insufficient documentation

## 2013-04-02 DIAGNOSIS — M79606 Pain in leg, unspecified: Secondary | ICD-10-CM

## 2013-04-02 LAB — CBC WITH DIFFERENTIAL/PLATELET
Basophils Absolute: 0.1 10*3/uL (ref 0.0–0.1)
Basophils Relative: 0 % (ref 0–1)
Eosinophils Absolute: 0.2 10*3/uL (ref 0.0–0.7)
Eosinophils Relative: 1 % (ref 0–5)
HCT: 47.8 % — ABNORMAL HIGH (ref 36.0–46.0)
Hemoglobin: 16 g/dL — ABNORMAL HIGH (ref 12.0–15.0)
Lymphocytes Relative: 17 % (ref 12–46)
Lymphs Abs: 2.6 10*3/uL (ref 0.7–4.0)
MCH: 32.8 pg (ref 26.0–34.0)
MCHC: 33.5 g/dL (ref 30.0–36.0)
MCV: 98 fL (ref 78.0–100.0)
Monocytes Absolute: 0.7 10*3/uL (ref 0.1–1.0)
Monocytes Relative: 5 % (ref 3–12)
NEUTROS PCT: 77 % (ref 43–77)
Neutro Abs: 11.8 10*3/uL — ABNORMAL HIGH (ref 1.7–7.7)
Platelets: 245 10*3/uL (ref 150–400)
RBC: 4.88 MIL/uL (ref 3.87–5.11)
RDW: 13.9 % (ref 11.5–15.5)
WBC: 15.4 10*3/uL — ABNORMAL HIGH (ref 4.0–10.5)

## 2013-04-02 LAB — COMPREHENSIVE METABOLIC PANEL
ALT: 19 U/L (ref 0–35)
AST: 16 U/L (ref 0–37)
Albumin: 3.8 g/dL (ref 3.5–5.2)
Alkaline Phosphatase: 72 U/L (ref 39–117)
BUN: 12 mg/dL (ref 6–23)
CO2: 22 meq/L (ref 19–32)
Calcium: 9.3 mg/dL (ref 8.4–10.5)
Chloride: 98 mEq/L (ref 96–112)
Creatinine, Ser: 1.1 mg/dL (ref 0.50–1.10)
GFR calc Af Amer: 67 mL/min — ABNORMAL LOW (ref >90)
GFR, EST NON AFRICAN AMERICAN: 58 mL/min — AB (ref >90)
Glucose, Bld: 238 mg/dL — ABNORMAL HIGH (ref 70–99)
Potassium: 4.3 mEq/L (ref 3.7–5.3)
SODIUM: 136 meq/L — AB (ref 137–147)
Total Bilirubin: 0.3 mg/dL (ref 0.3–1.2)
Total Protein: 7.5 g/dL (ref 6.0–8.3)

## 2013-04-02 LAB — URINALYSIS, ROUTINE W REFLEX MICROSCOPIC
BILIRUBIN URINE: NEGATIVE
Glucose, UA: 250 mg/dL — AB
HGB URINE DIPSTICK: NEGATIVE
Ketones, ur: NEGATIVE mg/dL
Leukocytes, UA: NEGATIVE
Nitrite: NEGATIVE
Protein, ur: NEGATIVE mg/dL
SPECIFIC GRAVITY, URINE: 1.003 — AB (ref 1.005–1.030)
Urobilinogen, UA: 0.2 mg/dL (ref 0.0–1.0)
pH: 6 (ref 5.0–8.0)

## 2013-04-02 MED ORDER — OXYCODONE-ACETAMINOPHEN 5-325 MG PO TABS: 1.0000 | ORAL_TABLET | ORAL | Status: DC | PRN

## 2013-04-02 MED ORDER — OXYCODONE-ACETAMINOPHEN 5-325 MG PO TABS
2.0000 | ORAL_TABLET | Freq: Once | ORAL | Status: AC
  Administered 2013-04-02: 2 via ORAL
  Filled 2013-04-02: qty 2

## 2013-04-02 NOTE — ED Provider Notes (Signed)
Medical screening examination/treatment/procedure(s) were performed by non-physician practitioner and as supervising physician I was immediately available for consultation/collaboration.   EKG Interpretation None        Osvaldo Shipper, MD 04/02/13 1521

## 2013-04-02 NOTE — Discharge Instructions (Signed)
Pain of Unknown Etiology (Pain Without a Known Cause) You have come to your caregiver because of pain. Pain can occur in any part of the body. Often there is not a definite cause. If your laboratory (blood or urine) work was normal and X-rays or other studies were normal, your caregiver may treat you without knowing the cause of the pain. An example of this is the headache. Most headaches are diagnosed by taking a history. This means your caregiver asks you questions about your headaches. Your caregiver determines a treatment based on your answers. Usually testing done for headaches is normal. Often testing is not done unless there is no response to medications. Regardless of where your pain is located today, you can be given medications to make you comfortable. If no physical cause of pain can be found, most cases of pain will gradually leave as suddenly as they came.  If you have a painful condition and no reason can be found for the pain, it is important that you follow up with your caregiver. If the pain becomes worse or does not go away, it may be necessary to repeat tests and look further for a possible cause.  Only take over-the-counter or prescription medicines for pain, discomfort, or fever as directed by your caregiver.  For the protection of your privacy, test results cannot be given over the phone. Make sure you receive the results of your test. Ask how these results are to be obtained if you have not been informed. It is your responsibility to obtain your test results.  You may continue all activities unless the activities cause more pain. When the pain lessens, it is important to gradually resume normal activities. Resume activities by beginning slowly and gradually increasing the intensity and duration of the activities or exercise. During periods of severe pain, bed rest may be helpful. Lie or sit in any position that is comfortable.  Ice used for acute (sudden) conditions may be effective.  Use a large plastic bag filled with ice and wrapped in a towel. This may provide pain relief.  See your caregiver for continued problems. Your caregiver can help or refer you for exercises or physical therapy if necessary. If you were given medications for your condition, do not drive, operate machinery or power tools, or sign legal documents for 24 hours. Do not drink alcohol, take sleeping pills, or take other medications that may interfere with treatment. See your caregiver immediately if you have pain that is becoming worse and not relieved by medications. Document Released: 09/19/2000 Document Revised: 10/15/2012 Document Reviewed: 12/25/2004 [facility] Patient Information 2014 [place].

## 2013-04-02 NOTE — ED Notes (Signed)
Patient transported to X-ray 

## 2013-04-02 NOTE — ED Notes (Signed)
Lower legs have been painful and swollen for the past couple of days. No injury. No SOB. Nausea.

## 2013-04-02 NOTE — ED Notes (Signed)
MD at bedside. 

## 2013-04-02 NOTE — ED Provider Notes (Signed)
CSN: [id number]     Arrival date & time 04/02/13  1304 History   First MD Initiated Contact with Patient 04/02/13 1316     Chief Complaint  Patient presents with  . Leg Swelling     (Consider location/radiation/quality/duration/timing/severity/associated sxs/prior Treatment) HPI Comments: Pt states that she is having bilateral lower extremity swelling over the last couple of days but it has resolved. Pt states that she recently had ultrasound to r/o dvt. Denies cp/sob. Pt states that she is not having any swelling right now. Pt states that her leg are hurting.denies any redness or warmth  The history is provided by the patient. No language interpreter was used.    Past Medical History  Diagnosis Date  . PE (pulmonary embolism)   . Anxiety   . Diabetes mellitus without complication   . Acid reflux   . Depression   . Adrenal benign tumor    History reviewed. No pertinent past surgical history. No family history on file. History  Substance Use Topics  . Smoking status: Current Every Day Smoker -- 1.00 packs/day    Types: Cigarettes  . Smokeless tobacco: Never Used  . Alcohol Use: No   OB History   Grav Para Term Preterm Abortions TAB SAB Ect Mult Living                 Review of Systems  Constitutional: Negative.   Respiratory: Negative.   Cardiovascular: Negative.       Allergies  Amoxicillin; Aspirin; Codeine; and Penicillins  Home Medications   Current Outpatient Rx  Name  Route  Sig  Dispense  Refill  . albuterol (PROVENTIL HFA;VENTOLIN HFA) 108 (90 BASE) MCG/ACT inhaler   Inhalation   Inhale 2 puffs into the lungs every 4 (four) hours as needed for wheezing or shortness of breath.   1 Inhaler   0   . ALPRAZolam (XANAX) 0.5 MG tablet   Oral   Take 1 tablet (0.5 mg total) by mouth 2 (two) times daily as needed for sleep.   60 tablet   0   . ALPRAZolam (XANAX) 0.5 MG tablet   Oral   Take 1 tablet (0.5 mg total) by mouth 2 (two) times daily as needed  for anxiety.   60 tablet   1   . ALPRAZolam (XANAX) 1 MG tablet   Oral   Take 28 tablets (28 mg total) by mouth 2 (two) times daily.   28 tablet   0   . azithromycin (ZITHROMAX Z-PAK) 250 MG tablet      Two tabets 1st day then one a day for days 2-5   6 tablet   0   . chlorpheniramine-HYDROcodone (TUSSIONEX PENNKINETIC ER) 10-8 MG/5ML LQCR   Oral   Take 5 mLs by mouth every 12 (twelve) hours as needed for cough.   115 mL   0   . diphenhydramine-acetaminophen (TYLENOL PM) 25-500 MG TABS   Oral   Take 1 tablet by mouth at bedtime as needed.         . doxycycline (VIBRAMYCIN) 100 MG capsule   Oral   Take 1 capsule (100 mg total) by mouth 2 (two) times daily. One po bid x 7 days   14 capsule   0   . FLUoxetine (PROZAC) 20 MG tablet   Oral   Take 2 tablets (40 mg total) by mouth daily.   60 tablet   1   . metFORMIN (GLUCOPHAGE-XR) 500 MG 24 hr tablet  Oral   Take 1 tablet (500 mg total) by mouth daily with breakfast.   30 tablet   0   . oxyCODONE-acetaminophen (PERCOCET/ROXICET) 5-325 MG per tablet   Oral   Take 1-2 tablets by mouth every 4 (four) hours as needed for severe pain.   6 tablet   0   . PARoxetine (PAXIL) 20 MG tablet   Oral   Take 2 tablets (40 mg total) by mouth every morning.   30 tablet   0   . PARoxetine (PAXIL) 20 MG tablet   Oral   Take 2 tablets (40 mg total) by mouth daily.   60 tablet   0   . predniSONE (DELTASONE) 10 MG tablet   Oral   Take 2 tablets (20 mg total) by mouth 2 (two) times daily.   20 tablet   0    BP 149/91  Pulse 113  Temp(Src) 98.5 F (36.9 C) (Oral)  Resp 20  Ht 5\' 6"  (1.676 m)  Wt 260 lb (117.935 kg)  BMI 41.99 kg/m2  SpO2 98%  LMP 03/08/2013 Physical Exam  Nursing note and vitals reviewed. Constitutional: She is oriented to person, place, and time. She appears well-developed and well-nourished.  Cardiovascular: Normal rate and regular rhythm.   Pulmonary/Chest: Effort normal and breath sounds  normal.  Abdominal: Soft. Bowel sounds are normal.  Musculoskeletal: Normal range of motion. She exhibits no edema.  No swelling redness or warmth noted  Neurological: She is alert and oriented to person, place, and time.  Skin: Skin is warm and dry.  Psychiatric: She has a normal mood and affect.    ED Course  Procedures (including critical care time) Labs Review Labs Reviewed  CBC WITH DIFFERENTIAL - Abnormal; Notable for the following:    WBC 15.4 (*)    Hemoglobin 16.0 (*)    HCT 47.8 (*)    Neutro Abs 11.8 (*)    All other components within normal limits  COMPREHENSIVE METABOLIC PANEL - Abnormal; Notable for the following:    Sodium 136 (*)    Glucose, Bld 238 (*)    GFR calc non Af Amer 58 (*)    GFR calc Af Amer 67 (*)    All other components within normal limits  URINALYSIS, ROUTINE W REFLEX MICROSCOPIC - Abnormal; Notable for the following:    Specific Gravity, Urine 1.003 (*)    Glucose, UA 250 (*)    All other components within normal limits   Imaging Review Dg Chest 2 View  04/02/2013   CLINICAL DATA:  Lower extremity swelling  EXAM: CHEST  2 VIEW  COMPARISON:  02/26/2013  FINDINGS: There is chronic interstitial coarsening, at the patient's baseline. There is emphysema based on previous CT imaging. No definitive pulmonary edema, no pneumonia. Normal heart size and mediastinal contours. Negative osseous structures.  IMPRESSION: Chronic bronchitic change. No evidence of acute cardiopulmonary disease.   Electronically Signed   By: Jorje Guild M.D.   On: 04/02/2013 13:54     EKG Interpretation None      MDM   Final diagnoses:  Lower extremity pain    No swelling redness or warmth suggestive or dvt.. No sign of renal insuffiencey or hf. Will treat pain    Glendell Docker, NP 04/02/13 1453

## 2013-07-30 ENCOUNTER — Emergency Department (HOSPITAL_BASED_OUTPATIENT_CLINIC_OR_DEPARTMENT_OTHER): Payer: No Typology Code available for payment source

## 2013-07-30 ENCOUNTER — Inpatient Hospital Stay (HOSPITAL_BASED_OUTPATIENT_CLINIC_OR_DEPARTMENT_OTHER)
Admission: EM | Admit: 2013-07-30 | Discharge: 2013-07-31 | DRG: 066 | Disposition: A | Discharge status: Home / Self Care | Payer: No Typology Code available for payment source | Attending: Internal Medicine | Admitting: Internal Medicine

## 2013-07-30 ENCOUNTER — Other Ambulatory Visit: Payer: Self-pay

## 2013-07-30 ENCOUNTER — Encounter (HOSPITAL_BASED_OUTPATIENT_CLINIC_OR_DEPARTMENT_OTHER): Payer: Self-pay | Admitting: Emergency Medicine

## 2013-07-30 DIAGNOSIS — Z886 Allergy status to analgesic agent status: Secondary | ICD-10-CM

## 2013-07-30 DIAGNOSIS — G459 Transient cerebral ischemic attack, unspecified: Secondary | ICD-10-CM | POA: Diagnosis present

## 2013-07-30 DIAGNOSIS — F329 Major depressive disorder, single episode, unspecified: Secondary | ICD-10-CM | POA: Diagnosis present

## 2013-07-30 DIAGNOSIS — I6930 Unspecified sequelae of cerebral infarction: Secondary | ICD-10-CM

## 2013-07-30 DIAGNOSIS — Z881 Allergy status to other antibiotic agents status: Secondary | ICD-10-CM | POA: Diagnosis not present

## 2013-07-30 DIAGNOSIS — R4701 Aphasia: Secondary | ICD-10-CM | POA: Diagnosis present

## 2013-07-30 DIAGNOSIS — R29898 Other symptoms and signs involving the musculoskeletal system: Secondary | ICD-10-CM | POA: Diagnosis present

## 2013-07-30 DIAGNOSIS — Z8673 Personal history of transient ischemic attack (TIA), and cerebral infarction without residual deficits: Secondary | ICD-10-CM | POA: Diagnosis not present

## 2013-07-30 DIAGNOSIS — G458 Other transient cerebral ischemic attacks and related syndromes: Secondary | ICD-10-CM

## 2013-07-30 DIAGNOSIS — IMO0001 Reserved for inherently not codable concepts without codable children: Secondary | ICD-10-CM

## 2013-07-30 DIAGNOSIS — Z888 Allergy status to other drugs, medicaments and biological substances status: Secondary | ICD-10-CM | POA: Diagnosis not present

## 2013-07-30 DIAGNOSIS — Z8249 Family history of ischemic heart disease and other diseases of the circulatory system: Secondary | ICD-10-CM | POA: Diagnosis not present

## 2013-07-30 DIAGNOSIS — F411 Generalized anxiety disorder: Secondary | ICD-10-CM | POA: Diagnosis present

## 2013-07-30 DIAGNOSIS — I6340 Cerebral infarction due to embolism of unspecified cerebral artery: Secondary | ICD-10-CM

## 2013-07-30 DIAGNOSIS — Z86711 Personal history of pulmonary embolism: Secondary | ICD-10-CM

## 2013-07-30 DIAGNOSIS — Z880 Allergy status to penicillin: Secondary | ICD-10-CM

## 2013-07-30 DIAGNOSIS — R209 Unspecified disturbances of skin sensation: Secondary | ICD-10-CM | POA: Diagnosis present

## 2013-07-30 DIAGNOSIS — Z86718 Personal history of other venous thrombosis and embolism: Secondary | ICD-10-CM

## 2013-07-30 DIAGNOSIS — E119 Type 2 diabetes mellitus without complications: Secondary | ICD-10-CM | POA: Diagnosis present

## 2013-07-30 DIAGNOSIS — I6350 Cerebral infarction due to unspecified occlusion or stenosis of unspecified cerebral artery: Secondary | ICD-10-CM | POA: Diagnosis present

## 2013-07-30 DIAGNOSIS — I63539 Cerebral infarction due to unspecified occlusion or stenosis of unspecified posterior cerebral artery: Secondary | ICD-10-CM | POA: Diagnosis present

## 2013-07-30 DIAGNOSIS — E781 Pure hyperglyceridemia: Secondary | ICD-10-CM | POA: Diagnosis present

## 2013-07-30 DIAGNOSIS — F17200 Nicotine dependence, unspecified, uncomplicated: Secondary | ICD-10-CM | POA: Diagnosis present

## 2013-07-30 DIAGNOSIS — I6359 Cerebral infarction due to unspecified occlusion or stenosis of other cerebral artery: Secondary | ICD-10-CM

## 2013-07-30 DIAGNOSIS — F3289 Other specified depressive episodes: Secondary | ICD-10-CM | POA: Diagnosis present

## 2013-07-30 DIAGNOSIS — E785 Hyperlipidemia, unspecified: Secondary | ICD-10-CM

## 2013-07-30 DIAGNOSIS — E1165 Type 2 diabetes mellitus with hyperglycemia: Secondary | ICD-10-CM

## 2013-07-30 LAB — CBC
HCT: 45.4 % (ref 36.0–46.0)
HEMOGLOBIN: 15.3 g/dL — AB (ref 12.0–15.0)
MCH: 32.6 pg (ref 26.0–34.0)
MCHC: 33.7 g/dL (ref 30.0–36.0)
MCV: 96.8 fL (ref 78.0–100.0)
Platelets: 216 10*3/uL (ref 150–400)
RBC: 4.69 MIL/uL (ref 3.87–5.11)
RDW: 13.9 % (ref 11.5–15.5)
WBC: 11.1 10*3/uL — ABNORMAL HIGH (ref 4.0–10.5)

## 2013-07-30 LAB — COMPREHENSIVE METABOLIC PANEL
ALBUMIN: 3.6 g/dL (ref 3.5–5.2)
ALT: 15 U/L (ref 0–35)
AST: 14 U/L (ref 0–37)
Alkaline Phosphatase: 72 U/L (ref 39–117)
Anion gap: 17 — ABNORMAL HIGH (ref 5–15)
BUN: 15 mg/dL (ref 6–23)
CALCIUM: 9 mg/dL (ref 8.4–10.5)
CO2: 21 mEq/L (ref 19–32)
Chloride: 99 mEq/L (ref 96–112)
Creatinine, Ser: 1.1 mg/dL (ref 0.50–1.10)
GFR calc Af Amer: 67 mL/min — ABNORMAL LOW (ref >90)
GFR calc non Af Amer: 58 mL/min — ABNORMAL LOW (ref >90)
Glucose, Bld: 164 mg/dL — ABNORMAL HIGH (ref 70–99)
Potassium: 3.9 mEq/L (ref 3.7–5.3)
SODIUM: 137 meq/L (ref 137–147)
Total Bilirubin: <0.2 mg/dL — ABNORMAL LOW (ref 0.3–1.2)
Total Protein: 6.8 g/dL (ref 6.0–8.3)

## 2013-07-30 LAB — URINALYSIS, ROUTINE W REFLEX MICROSCOPIC
Bilirubin Urine: NEGATIVE
GLUCOSE, UA: NEGATIVE mg/dL
HGB URINE DIPSTICK: NEGATIVE
Ketones, ur: NEGATIVE mg/dL
Nitrite: NEGATIVE
PH: 5.5 (ref 5.0–8.0)
Protein, ur: NEGATIVE mg/dL
SPECIFIC GRAVITY, URINE: 1.005 (ref 1.005–1.030)
Urobilinogen, UA: 0.2 mg/dL (ref 0.0–1.0)

## 2013-07-30 LAB — APTT: APTT: 29 s (ref 24–37)

## 2013-07-30 LAB — URINE MICROSCOPIC-ADD ON

## 2013-07-30 LAB — PROTIME-INR
INR: 0.85 (ref 0.00–1.49)
PROTHROMBIN TIME: 11.6 s (ref 11.6–15.2)

## 2013-07-30 LAB — TROPONIN I

## 2013-07-30 MED ORDER — DIPHENHYDRAMINE-APAP (SLEEP) 25-500 MG PO TABS: 1.0000 | ORAL_TABLET | Freq: Every evening | ORAL | Status: DC | PRN

## 2013-07-30 MED ORDER — HYDROCODONE-ACETAMINOPHEN 5-325 MG PO TABS
1.0000 | ORAL_TABLET | Freq: Four times a day (QID) | ORAL | Status: DC | PRN
  Administered 2013-07-30 – 2013-07-31 (×2): 1 via ORAL
  Filled 2013-07-30 (×2): qty 1

## 2013-07-30 MED ORDER — STROKE: EARLY STAGES OF RECOVERY BOOK
Freq: Once | Status: AC
  Administered 2013-07-31: 08:00:00
  Filled 2013-07-30: qty 1

## 2013-07-30 MED ORDER — ALPRAZOLAM 0.5 MG PO TABS: 0.5000 mg | ORAL_TABLET | Freq: Two times a day (BID) | ORAL | Status: DC | PRN

## 2013-07-30 MED ORDER — ACETAMINOPHEN 325 MG PO TABS
650.0000 mg | ORAL_TABLET | Freq: Every evening | ORAL | Status: DC | PRN
  Administered 2013-07-31: 650 mg via ORAL
  Filled 2013-07-30: qty 2

## 2013-07-30 MED ORDER — DIPHENHYDRAMINE HCL 25 MG PO CAPS: 25.0000 mg | ORAL_CAPSULE | Freq: Every evening | ORAL | Status: DC | PRN

## 2013-07-30 MED ORDER — HEPARIN SODIUM (PORCINE) 5000 UNIT/ML IJ SOLN
5000.0000 [IU] | Freq: Three times a day (TID) | INTRAMUSCULAR | Status: DC
  Administered 2013-07-30 – 2013-07-31 (×3): 5000 [IU] via SUBCUTANEOUS
  Filled 2013-07-30 (×3): qty 1

## 2013-07-30 MED ORDER — METFORMIN HCL ER 500 MG PO TB24
500.0000 mg | ORAL_TABLET | Freq: Every day | ORAL | Status: DC
  Administered 2013-07-31: 500 mg via ORAL
  Filled 2013-07-30: qty 1

## 2013-07-30 MED ORDER — ALBUTEROL SULFATE (2.5 MG/3ML) 0.083% IN NEBU: 3.0000 mL | INHALATION_SOLUTION | RESPIRATORY_TRACT | Status: DC | PRN

## 2013-07-30 MED ORDER — PAROXETINE HCL 20 MG PO TABS
40.0000 mg | ORAL_TABLET | Freq: Every day | ORAL | Status: DC
  Administered 2013-07-31: 40 mg via ORAL
  Filled 2013-07-30: qty 2

## 2013-07-30 MED ORDER — ALPRAZOLAM 0.5 MG PO TABS: 1.0000 mg | ORAL_TABLET | Freq: Two times a day (BID) | ORAL | Status: DC | PRN

## 2013-07-30 NOTE — H&P (Signed)
[facility] History and Physical  [name]:[id number] DOB: 07-18-1965 DOA: 07/30/2013  Referring physician: EDP PCP: Natalie Sheller, MD   Chief Complaint: RUE weakness   HPI: [name] is a 48 y.o. female h/o PE in 2009 no longer on anticoagulation, DM2 on metformin only.  Presents to ED with new onset RUE weakness and numbness, dysarthria, difficulty with word finding.  Symptoms onset at 12:30 pm while at rest.  Resolving spontaneously 45-60 mins later PTA at the ED.  Headache onset later today but not before symptoms, no recent head injury, no known history of prior stroke.  CT head demonstrated areas of chronic encephalomalacia which were new findings since a 2007 CT head.  Review of Systems: Systems reviewed.  As above, otherwise negative  Past Medical History  Diagnosis Date  . PE (pulmonary embolism)   . Anxiety   . Diabetes mellitus without complication   . Acid reflux   . Depression   . Adrenal benign tumor     Apparently enlarging and patient "is supposed to have it taken out"   History reviewed. No pertinent past surgical history. Social History:  reports that she has been smoking Cigarettes.  She has been smoking about 1.00 pack per day. She has never used smokeless tobacco. She reports that she does not drink alcohol or use illicit drugs.  Allergies  Allergen Reactions  . Amoxicillin Hives  . Aspirin Hives  . Codeine Hives  . Penicillins Hives    No family history on file.   Prior to Admission medications   Medication Sig Start Date End Date Taking? Authorizing Provider  albuterol (PROVENTIL HFA;VENTOLIN HFA) 108 (90 BASE) MCG/ACT inhaler Inhale 2 puffs into the lungs every 4 (four) hours as needed for wheezing or shortness of breath. 12/06/12  Yes Lucila Maine, PA-C  ALPRAZolam [name]) 0.5 MG tablet Take 1 tablet (0.5 mg total) by mouth 2 (two) times daily as needed for anxiety. 12/08/12  Yes Natalie Dessert, MD  ALPRAZolam  [name]) 1 MG tablet Take 1 mg by mouth 2 (two) times daily as needed for anxiety.   Yes Historical Provider, MD  cyclobenzaprine (FLEXERIL) 10 MG tablet Take 10 mg by mouth daily as needed. 06/29/13  Yes Historical Provider, MD  diphenhydramine-acetaminophen (TYLENOL PM) 25-500 MG TABS Take 1 tablet by mouth at bedtime.    Yes Historical Provider, MD  metFORMIN (GLUCOPHAGE-XR) 500 MG 24 hr tablet Take 1 tablet (500 mg total) by mouth daily with breakfast. 10/24/12  Yes Natalie Kinnier Sofia, PA-C  PARoxetine (PAXIL) 20 MG tablet Take 2 tablets (40 mg total) by mouth daily. 03/30/13  Yes Natalie Szekalski, PA-C  promethazine (PHENERGAN) 25 MG tablet Take 25 mg by mouth every 6 (six) hours as needed for nausea or vomiting.   Yes Historical Provider, MD  SUMAtriptan (IMITREX) 100 MG tablet Take 100 mg by mouth as needed. For migraines 07/04/13  Yes Historical Provider, MD   Physical Exam: Filed Vitals:   07/30/13 1913  BP: 146/79  Pulse: 107  Temp: 98.7 F (37.1 C)  Resp: 20    BP 146/79  Pulse 107  Temp(Src) 98.7 F (37.1 C) (Oral)  Resp 20  Ht 5\' 6"  (1.676 m)  Wt 113.399 kg (250 lb)  BMI 40.37 kg/m2  SpO2 99%  LMP 07/23/2013  General Appearance:    Alert, oriented, understandably anxious after being informed about findings, appears stated age  Head:    Normocephalic, atraumatic  Eyes:    PERRL, EOMI,  sclera non-icteric        Nose:   Nares without drainage or epistaxis. Mucosa, turbinates normal  Throat:   Moist mucous membranes. Oropharynx without erythema or exudate.  Neck:   Supple. No carotid bruits.  No thyromegaly.  No lymphadenopathy.   Back:     No CVA tenderness, no spinal tenderness  Lungs:     Clear to auscultation bilaterally, without wheezes, rhonchi or rales  Chest wall:    No tenderness to palpitation  Heart:    Regular rate and rhythm without murmurs, gallops, rubs  Abdomen:     Soft, non-tender, nondistended, normal bowel sounds, no organomegaly  Genitalia:    deferred   Rectal:    deferred  Extremities:   No clubbing, cyanosis or edema.  Pulses:   2+ and symmetric all extremities  Skin:   Skin color, texture, turgor normal, no rashes or lesions  Lymph nodes:   Cervical, supraclavicular, and axillary nodes normal  Neurologic:   CNII-XII intact. Normal strength, sensation and reflexes      throughout    Labs on Admission:  Basic Metabolic Panel:  Recent Labs Lab 07/30/13 1400  NA 137  K 3.9  CL 99  CO2 21  GLUCOSE 164*  BUN 15  CREATININE 1.10  CALCIUM 9.0   Liver Function Tests:  Recent Labs Lab 07/30/13 1400  AST 14  ALT 15  ALKPHOS 72  BILITOT <0.2*  PROT 6.8  ALBUMIN 3.6   No results found for this basename: LIPASE, AMYLASE,  in the last 168 hours No results found for this basename: AMMONIA,  in the last 168 hours CBC:  Recent Labs Lab 07/30/13 1400  WBC 11.1*  HGB 15.3*  HCT 45.4  MCV 96.8  PLT 216   Cardiac Enzymes:  Recent Labs Lab 07/30/13 1400  TROPONINI <0.30    BNP (last 3 results) No results found for this basename: PROBNP,  in the last 8760 hours CBG: No results found for this basename: GLUCAP,  in the last 168 hours  Radiological Exams on Admission: Ct Head Wo Contrast  07/30/2013   CLINICAL DATA:  Right arm numbness with slurred speech 1 hr go. Symptoms in a resolved.  EXAM: CT HEAD WITHOUT CONTRAST  TECHNIQUE: Contiguous axial images were obtained from the base of the skull through the vertex without intravenous contrast.  COMPARISON:  Prior CT from 12/11/2005.  FINDINGS: There is no acute intracranial hemorrhage or large vessel territory infarct. Focal encephalomalacia present within the left occipital lobe, consistent with remote left PCA territory infarct. Additional wedge-shaped hypodensity within the left cerebellar hemisphere also compatible with remote infarct.  No mass lesion or midline shift. Gray-white matter differentiation is well maintained. Ventricles are normal in size without evidence of  hydrocephalus. CSF containing spaces are within normal limits. No extra-axial fluid collection.  The calvarium is intact.  Orbital soft tissues are within normal limits.  There is partial opacification of the right sphenoid sinus. Paranasal sinuses are otherwise clear. No mastoid effusion.  Scalp soft tissues are unremarkable.  IMPRESSION: 1. No acute intracranial hemorrhage or large vessel territory infarct identified. 2. Encephalomalacia within the left occipital lobe, compatible with remote left PCA territory infarct. This finding is new as compared to prior CT from 2007. 3. Remote left cerebellar infarct, also new from 2007.   Electronically Signed   By: Jeannine Boga M.D.   On: 07/30/2013 14:14    EKG: Independently reviewed.  Assessment/Plan Principal Problem:   TIA (transient  ischemic attack) Active Problems:   Chronic ischemic left PCA stroke   Posterior circulation stroke of uncertain pathology   1. TIA - given the not previously known ischemic strokes, patient getting full stroke work up at this time.  On stroke pathway, neurology to see patient for recs.  On stroke pathway. 2. Chronic ischemic L PCA stroke and L cerebellar stroke - seen on CT scan, occurred after 2007, unknown when.    Code Status: Full Code  Family Communication: Family at bedside Disposition Plan: Admit to inpatient   Time spent: 70 min  GARDNER, JARED M. [facility] Pager [phone]  If 7AM-7PM, please contact the day team taking care of the patient [email or web address] Password [facility] 07/30/2013, 8:12 PM

## 2013-07-30 NOTE — ED Provider Notes (Signed)
TIME SEEN: 1:47 PM  CHIEF COMPLAINT: Right upper extremity numbness and weakness, dysarthria and aphasia  HPI: Patient is a 48 year old female with history of pulmonary embolus no longer on anticoagulation, diabetes who presents the emergency department with right upper extremity numbness and weakness, dysarthria and difficulty word finding that started at 12:30 PM while at rest. She states her symptoms resolved spontaneously approximately 45-60 minutes later prior to arrival in the emergency department. She denies any recent headache, head injury. No chest pain or shortness of breath. She is never had a stroke or TIA that she is aware of. She is not on any antiplatelets, states she is allergic to aspirin and that it causes hives.  ROS: See HPI Constitutional: no fever  Eyes: no drainage  ENT: no runny nose   Cardiovascular:  no chest pain  Resp: no SOB  GI: no vomiting GU: no dysuria Integumentary: no rash  Allergy: no hives  Musculoskeletal: no leg swelling  Neurological: no slurred speech ROS otherwise negative  PAST MEDICAL HISTORY/PAST SURGICAL HISTORY:  Past Medical History  Diagnosis Date  . PE (pulmonary embolism)   . Anxiety   . Diabetes mellitus without complication   . Acid reflux   . Depression   . Adrenal benign tumor     MEDICATIONS:  Prior to Admission medications   Medication Sig Start Date End Date Taking? Authorizing Provider  albuterol (PROVENTIL HFA;VENTOLIN HFA) 108 (90 BASE) MCG/ACT inhaler Inhale 2 puffs into the lungs every 4 (four) hours as needed for wheezing or shortness of breath. 12/06/12   Lucila Maine, PA-C  ALPRAZolam [name]) 0.5 MG tablet Take 1 tablet (0.5 mg total) by mouth 2 (two) times daily as needed for sleep. 11/04/12   Threasa Beards, MD  ALPRAZolam [name]) 0.5 MG tablet Take 1 tablet (0.5 mg total) by mouth 2 (two) times daily as needed for anxiety. 12/08/12   Blanchie Dessert, MD  ALPRAZolam (XANAX) 1 MG tablet Take 28 tablets (28 mg  total) by mouth 2 (two) times daily. 02/26/13   Veryl Speak, MD  azithromycin (ZITHROMAX Z-PAK) 250 MG tablet Two tabets 1st day then one a day for days 2-5 02/10/13   Fransico Meadow, PA-C  chlorpheniramine-HYDROcodone [facility]) 10-8 MG/5ML LQCR Take 5 mLs by mouth every 12 (twelve) hours as needed for cough. 02/26/13   Veryl Speak, MD  diphenhydramine-acetaminophen (TYLENOL PM) 25-500 MG TABS Take 1 tablet by mouth at bedtime as needed.    Historical Provider, MD  doxycycline (VIBRAMYCIN) 100 MG capsule Take 1 capsule (100 mg total) by mouth 2 (two) times daily. One po bid x 7 days 02/26/13   Veryl Speak, MD  FLUoxetine (PROZAC) 20 MG tablet Take 2 tablets (40 mg total) by mouth daily. 12/08/12   Blanchie Dessert, MD  metFORMIN (GLUCOPHAGE-XR) 500 MG 24 hr tablet Take 1 tablet (500 mg total) by mouth daily with breakfast. 10/24/12   Fransico Meadow, PA-C  oxyCODONE-acetaminophen (PERCOCET/ROXICET) 5-325 MG per tablet Take 1-2 tablets by mouth every 4 (four) hours as needed for severe pain. 04/02/13   Glendell Docker, NP  PARoxetine (PAXIL) 20 MG tablet Take 2 tablets (40 mg total) by mouth every morning. 02/26/13   Veryl Speak, MD  PARoxetine (PAXIL) 20 MG tablet Take 2 tablets (40 mg total) by mouth daily. 03/30/13   Kaitlyn Szekalski, PA-C  predniSONE (DELTASONE) 10 MG tablet Take 2 tablets (20 mg total) by mouth 2 (two) times daily. 02/26/13   Veryl Speak, MD  ALLERGIES:  Allergies  Allergen Reactions  . Amoxicillin Hives  . Aspirin Hives  . Codeine Hives  . Penicillins Hives    SOCIAL HISTORY:  History  Substance Use Topics  . Smoking status: Current Every Day Smoker -- 1.00 packs/day    Types: Cigarettes  . Smokeless tobacco: Never Used  . Alcohol Use: No    FAMILY HISTORY: No family history on file.  EXAM: BP 119/90  Pulse 115  Temp(Src) 98.7 F (37.1 C) (Oral)  Resp 20  Ht 5\' 6"  (1.676 m)  Wt 250 lb (113.399 kg)  BMI 40.37 kg/m2  SpO2 97%  LMP  07/23/2013 CONSTITUTIONAL: Alert and oriented and responds appropriately to questions. Well-appearing; well-nourished HEAD: Normocephalic EYES: Conjunctivae clear, PERRL ENT: normal nose; no rhinorrhea; moist mucous membranes; pharynx without lesions noted NECK: Supple, no meningismus, no LAD  CARD: RRR; S1 and S2 appreciated; no murmurs, no clicks, no rubs, no gallops RESP: Normal chest excursion without splinting or tachypnea; breath sounds clear and equal bilaterally; no wheezes, no rhonchi, no rales,  ABD/GI: Normal bowel sounds; non-distended; soft, non-tender, no rebound, no guarding BACK:  The back appears normal and is non-tender to palpation, there is no CVA tenderness EXT: Normal ROM in all joints; non-tender to palpation; no edema; normal capillary refill; no cyanosis    SKIN: Normal color for age and race; warm NEURO: Moves all extremities equally, strength 5/5 upper extremities, no pronator drift, no dysmetria to finger-nose testing, oriented x3, sensation to light touch intact diffusely, cranial nerves II through XII intact, NIH stroke scale is 0 PSYCH: The patient's mood and manner are appropriate. Grooming and personal hygiene are appropriate.  MEDICAL DECISION MAKING: Patient here with symptoms of possible TIA. We'll obtain labs, urine, CT head. Have recommended admission for TIA workup. PCP is Dr. Trilby Drummer with [place].  ED PROGRESS: Patient's labs show leukocytosis of 11.1 which may be reactive. She has trace leukocytes in her urine but no other sign of infection. Patient's head CT shows encephalomalacia within the left occipital lobe compatible with a remote left PCA territory infarct that is new compared to prior CT from 2007. There is also a remote left cerebellar infarct new from 2007. Patient states that she is aware that she has had a stroke before and has never been worked up for the same. Have recommended admission. Discussed with Dr. Inis Sizer at [facility]  for admission to tele, inpt.  Patient is still neurologically intact. Also discussed with Dr. Nicole Kindred with neurology who will see the patient in consult.    EKG Interpretation  Date/Time:  Thursday July 30 2013 13:26:17 EDT Ventricular Rate:  113 PR Interval:  124 QRS Duration: 68 QT Interval:  334 QTC Calculation: 458 R Axis:   67 Text Interpretation:  Sinus tachycardia Low voltage QRS Borderline ECG Confirmed by Ezmeralda Stefanick,  DO, Yari Szeliga ([id number]) on 07/30/2013 1:32:34 PM        [place], DO 07/30/13 1647

## 2013-07-30 NOTE — ED Notes (Signed)
Attempt to call report charge nurse not available

## 2013-07-30 NOTE — ED Notes (Signed)
Mother in law and patient states that at approximately 12:20 pm today, she sat down to use the bathroom, and when she tried to stand up, she could not feel her right arm.  States she did not feel right.  States she walked outside to talk to her mother in law at 12:30 and mother in law states she had slurred speech.  States symptoms increased and lasted for approximately 45 minutes and began to clear.  Now has numbness in the right arm only with minimal control over moments.

## 2013-07-30 NOTE — Progress Notes (Signed)
Charge nurse received report from [facility] @1700 . Patient being admitted for stroke work up. To be transported via [facility]. Per RN, [facility] has not arrived yet.

## 2013-07-30 NOTE — ED Notes (Signed)
Patient transported to CT 

## 2013-07-30 NOTE — Progress Notes (Signed)
Patient admitted to room 4N01 at this time. Alert and in stable condition.

## 2013-07-30 NOTE — Consult Note (Signed)
Neurology Consultation Reason for Consult: Transient right arm weakness and numbness Referring Physician: Sheran Luz.  CC: Transient right arm numbness and weakness  History is obtained from: Patient, family  HPI: [name] is a 48 y.o. female with a history of PE, DM who presents with transient right arm numbness that lasted for approximately 15 minutes. She states that she is currently back to baseline. She describes a sensation as her right arm just going completely numb, and flopping around without much control. She denies any previous history of similar events.  Of note, she is allergic to aspirin.  CT scan shows to previous strokes, though there are no clear historical clues to when these might of happened. They are new since a 2007 CT scan.   LKW: 12:20 PM tpa given?: no, resolve symptoms    ROS: A 14 point ROS was performed and is negative except as noted in the HPI.   Past Medical History  Diagnosis Date  . PE (pulmonary embolism)   . Anxiety   . Diabetes mellitus without complication   . Acid reflux   . Depression   . Adrenal benign tumor     Apparently enlarging and patient "is supposed to have it taken out"    Family History: Heart attacks  Social History: Tob: Current smoker  Exam: Current vital signs: BP 146/88  Pulse 106  Temp(Src) 98.4 F (36.9 C) (Oral)  Resp 18  Ht 5\' 6"  (1.676 m)  Wt 113.399 kg (250 lb)  BMI 40.37 kg/m2  SpO2 98%  LMP 07/23/2013 Vital signs in last 24 hours: Temp:  [98.4 F (36.9 C)-98.7 F (37.1 C)] 98.4 F (36.9 C) (07/23 2100) Pulse Rate:  [106-115] 106 (07/23 2100) Resp:  [16-20] 18 (07/23 2100) BP: (119-146)/(77-90) 146/88 mmHg (07/23 2100) SpO2:  [97 %-99 %] 98 % (07/23 2100) Weight:  [113.399 kg (250 lb)] 113.399 kg (250 lb) (07/23 1325)  General: In bed, NAD CV: Regular rate and rhythm Mental Status: Patient is awake, alert, oriented to person, place, month, year, and situation. Immediate and remote  memory are intact. Patient is able to give a clear and coherent history. No signs of aphasia or neglect Cranial Nerves: II: Visual Fields are full. Pupils are equal, round, and reactive to light.  Discs are difficult to visualize. III,IV, VI: EOMI without ptosis or diploplia.  V: Facial sensation is symmetric to temperature VII: Facial movement is symmetric.  VIII: hearing is intact to voice X: Uvula elevates symmetrically XI: Shoulder shrug is symmetric. XII: tongue is midline without atrophy or fasciculations.  Motor: Tone is normal. Bulk is normal. 5/5 strength was present in all four extremities.  Sensory: Sensation is symmetric to light touch and temperature in the arms and legs. Deep Tendon Reflexes: 2+ and symmetric in the biceps and patellae.  Plantars: Toes are downgoing bilaterally.  Cerebellar: FNF and HKS are intact bilaterally Gait: Not tested due to multiple monitors.   I have reviewed labs in epic and the results pertinent to this consultation are: CMP-unremarkable  I have reviewed the images obtained: CT head-previous left cerebellar and left PCA territory stroke  Impression: 48 year old female with transient right arm weakness and numbness consistent with a transient ischemic attack. Especially in the setting of her previous strokes, she needs further workup. Given the posterior circulation location, would favor MRA of the neck rather than carotid Dopplers.  Recommendations: 1. HgbA1c, fasting lipid panel 2. MRI, MRA  of the brain without contrast, MRA of the  neck with contrast 3. Frequent neuro checks 4. Echocardiogram 5. Carotid dopplers are not needed given MRA of the neck be done 6. Prophylactic therapy-Antiplatelet med: Plavix 75 mg daily, patient is allergic to aspirin 7. Risk factor modification 8. Telemetry monitoring 9. PT consult, OT consult, Speech consult    Roland Rack, MD [facility] [phone]  If 7pm- 7am, please  page neurology on call as listed in [place].

## 2013-07-31 ENCOUNTER — Inpatient Hospital Stay (HOSPITAL_COMMUNITY): Payer: No Typology Code available for payment source

## 2013-07-31 DIAGNOSIS — I6340 Cerebral infarction due to embolism of unspecified cerebral artery: Secondary | ICD-10-CM

## 2013-07-31 DIAGNOSIS — E1165 Type 2 diabetes mellitus with hyperglycemia: Secondary | ICD-10-CM

## 2013-07-31 DIAGNOSIS — IMO0001 Reserved for inherently not codable concepts without codable children: Secondary | ICD-10-CM

## 2013-07-31 DIAGNOSIS — E785 Hyperlipidemia, unspecified: Secondary | ICD-10-CM

## 2013-07-31 DIAGNOSIS — F17200 Nicotine dependence, unspecified, uncomplicated: Secondary | ICD-10-CM

## 2013-07-31 DIAGNOSIS — I059 Rheumatic mitral valve disease, unspecified: Secondary | ICD-10-CM

## 2013-07-31 DIAGNOSIS — I6350 Cerebral infarction due to unspecified occlusion or stenosis of unspecified cerebral artery: Secondary | ICD-10-CM

## 2013-07-31 LAB — HEMOGLOBIN A1C
HEMOGLOBIN A1C: 8.9 % — AB (ref <5.7)
MEAN PLASMA GLUCOSE: 209 mg/dL — AB (ref <117)

## 2013-07-31 LAB — TSH: TSH: 0.941 u[IU]/mL (ref 0.350–4.500)

## 2013-07-31 LAB — LIPID PANEL
CHOL/HDL RATIO: 7.8 ratio
Cholesterol: 234 mg/dL — ABNORMAL HIGH (ref 0–200)
HDL: 30 mg/dL — ABNORMAL LOW (ref >39)
LDL Cholesterol: UNDETERMINED mg/dL (ref 0–99)
Triglycerides: 401 mg/dL — ABNORMAL HIGH (ref <150)
VLDL: UNDETERMINED mg/dL (ref 0–40)

## 2013-07-31 LAB — ANTITHROMBIN III: ANTITHROMB III FUNC: 110 % (ref 75–120)

## 2013-07-31 LAB — HOMOCYSTEINE: HOMOCYSTEINE-NORM: 10.6 umol/L (ref 4.0–15.4)

## 2013-07-31 MED ORDER — CLOPIDOGREL BISULFATE 75 MG PO TABS: 75.0000 mg | ORAL_TABLET | Freq: Every day | ORAL | Status: DC

## 2013-07-31 MED ORDER — FENOFIBRATE 160 MG PO TABS: 160.0000 mg | ORAL_TABLET | Freq: Every day | ORAL | Status: DC

## 2013-07-31 MED ORDER — FENOFIBRATE 160 MG PO TABS
160.0000 mg | ORAL_TABLET | Freq: Every day | ORAL | Status: DC
  Administered 2013-07-31: 160 mg via ORAL
  Filled 2013-07-31: qty 1

## 2013-07-31 MED ORDER — GADOBENATE DIMEGLUMINE 529 MG/ML IV SOLN
20.0000 mL | Freq: Once | INTRAVENOUS | Status: AC
  Administered 2013-07-31: 20 mL via INTRAVENOUS

## 2013-07-31 MED ORDER — LORAZEPAM 2 MG/ML IJ SOLN
INTRAMUSCULAR | Status: AC
  Filled 2013-07-31: qty 1

## 2013-07-31 MED ORDER — ATORVASTATIN CALCIUM 20 MG PO TABS: 20.0000 mg | ORAL_TABLET | Freq: Every day | ORAL | Status: DC

## 2013-07-31 MED ORDER — CLOPIDOGREL BISULFATE 75 MG PO TABS
75.0000 mg | ORAL_TABLET | Freq: Every day | ORAL | Status: DC
Start: 2013-07-31 — End: 2013-07-31
  Administered 2013-07-31 (×2): 75 mg via ORAL
  Filled 2013-07-31 (×2): qty 1

## 2013-07-31 MED ORDER — LORAZEPAM 2 MG/ML IJ SOLN: 1.0000 mg | Freq: Once | INTRAMUSCULAR | Status: DC

## 2013-07-31 NOTE — Progress Notes (Signed)
*  PRELIMINARY RESULTS* Echocardiogram 2D Echocardiogram has been performed.  Leavy Cella 07/31/2013, 4:23 PM

## 2013-07-31 NOTE — Progress Notes (Signed)
Discharge orders received, pt for discharge home today.  IV and telemetry D/C.  D/C instructions and Rx given with verbalized understanding.  Family at bedside to assist pt with discharge. Staff brought pt downstairs via wheelchair.  Pt could not void for urine drug screen prior to leaving and would not wait until she could to submit the urine sample.

## 2013-07-31 NOTE — Evaluation (Signed)
Occupational Therapy Evaluation and Discharge Patient Details Name: FREDERICKA BOTTCHER MRN: [id number] DOB: 08/06/1965 Today's Date: 07/31/2013    History of Present Illness MARTIN SMEAL is a 48 y.o. female with a history of PE, DM who presents with transient right arm numbness that lasted for approximately 15 minutes. She states that she is currently back to baseline. She describes a sensation as her right arm just going completely numb, and flopping around without much control. She denies any previous history of similar events. MRI pending.   Clinical Impression   Pt admitted with above.  Pt reports right arm numbness and weakness has resolved. Pt able to perform ADL tasks independently during session (baseline).  No OT services needed. Will sign off.    Follow Up Recommendations  No OT follow up    Equipment Recommendations  None recommended by OT    Recommendations for Other Services       Precautions / Restrictions        Mobility Bed Mobility Overal bed mobility: Independent                Transfers Overall transfer level: Independent                    Balance Overall balance assessment: Independent                                          ADL Overall ADL's : Independent                                       General ADL Comments: Pt able to ambulate independently around unit, step over objects and pick up objects without LOB.  Pt at baseline- independent.  OT provided education on stroke signs/symptoms (BE FAST).  Pt verbalized understanding.     Vision                     Perception     Praxis      Pertinent Vitals/Pain No c/o pain     Hand Dominance Right   Extremity/Trunk Assessment Upper Extremity Assessment Upper Extremity Assessment: Overall WFL for tasks assessed           Communication Communication Communication: No difficulties   Cognition Arousal/Alertness:  Awake/alert Behavior During Therapy: WFL for tasks assessed/performed Overall Cognitive Status: Within Functional Limits for tasks assessed                     General Comments       Exercises       Shoulder Instructions      Home Living Family/patient expects to be discharged to:: Private residence Living Arrangements: Spouse/significant other;Children Available Help at Discharge: Family Type of Home: House       Home Layout: One level     Bathroom Shower/Tub: Tub/shower unit;Walk-in shower   Bathroom Toilet: Standard     Home Equipment: None          Prior Functioning/Environment Level of Independence: Independent             OT Diagnosis:     OT Problem List:     OT Treatment/Interventions:      OT Goals(Current goals can be found in the care plan section)  OT Frequency:     Barriers to D/C:            Co-evaluation              End of Session Nurse Communication: Mobility status (OT signed transfer check off list for pt to be independent)  Activity Tolerance: Patient tolerated treatment well Patient left: in chair;with call bell/phone within reach   Time: 0825-0841 OT Time Calculation (min): 16 min Charges:  OT General Charges $OT Visit: 1 Procedure OT Evaluation $Initial OT Evaluation Tier I: 1 Procedure G-Codes:    Darrol Jump 07/31/2013, 8:46 AM  07/31/2013 Darrol Jump OTR/L Pager [phone] Office [phone]

## 2013-07-31 NOTE — Progress Notes (Addendum)
Stroke Team Progress Note   Admit Date: 07/30/2013  LOS: 1 day   HISTORY [name] is a 48 y.o. female with a history of PE, DM, current smoker who presents with transient right arm numbness and weakness, stumbling when walking and left facial droop with slurry speech that lasted for approximately 45 minutes. She states that she is currently back to baseline. She describes a sensation as her right arm just going completely numb, and flopping around without much control. She denies any previous history of similar events.  Of note, she is allergic to aspirin.  CT scan shows to previous strokes, though there are no clear historical clues to when these might of happened. They are new since a 2007 CT scan.  Patient was not administered TPA secondary to symptoms resolved. She was admitted to the neuro floor for further evaluation and treatment.  SUBJECTIVE  husband, daughter and mom are at the bedside.  Overall she feels her condition is completely resolved. 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 Not on any antipletelet due to allergic to ASA. She also has hx of DM and HLD, on metformin, xanax and Paxil. She denies any family history of clotting disorders. Denies any miscarriages.  She is current smoker. 1.5 PPD for 40 years. No alcohol or illicit drugs. Pt refused further inpatient work up, requesting to go home although I have strongly recommend her to stay to complete stroke work up. Pt insisted that she needs to go home. Will do urine tox and hypercoagulable work up before discharge.  OBJECTIVE Most recent Vital Signs: Filed Vitals:   07/31/13 0300 07/31/13 0500 07/31/13 0700 07/31/13 1434  BP: 161/102 141/83 145/91 136/90  Pulse: 115 101 102 96  Temp: 99.3 F (37.4 C) 99 F (37.2 C) 99 F (37.2 C) 98.3 F (36.8 C)  TempSrc: Oral Oral Oral Oral  Resp: 18 16 18 18   Height:      Weight:      SpO2: 94% 95% 94%  98%   CBG (last 3)  No results found for this basename: GLUCAP,  in the last 72 hours   IV Fluid Intake:     Medications: Scheduled:  . clopidogrel  75 mg Oral Daily  . fenofibrate  160 mg Oral Daily  . heparin  5,000 Units Subcutaneous 3 times per day  . LORazepam      . LORazepam  1-2 mg Intravenous Once  . metFORMIN  500 mg Oral Q breakfast  . PARoxetine  40 mg Oral Daily   Continuous Infusions:   PRN:   Diet:  Carb Control  Activity:  As tolerated DVT Prophylaxis:  SCDs and early ambulation  CLINICALLY SIGNIFICANT STUDIES Basic Metabolic Panel:  Recent Labs Lab 07/30/13 1400  NA 137  K 3.9  CL 99  CO2 21  GLUCOSE 164*  BUN 15  CREATININE 1.10  CALCIUM 9.0   Liver Function Tests:  Recent Labs Lab 07/30/13 1400  AST 14  ALT 15  ALKPHOS 72  BILITOT <0.2*  PROT 6.8  ALBUMIN 3.6   CBC:  Recent Labs Lab 07/30/13 1400  WBC 11.1*  HGB 15.3*  HCT 45.4  MCV 96.8  PLT 216   Coagulation:  Recent Labs Lab 07/30/13 1400  LABPROT 11.6  INR 0.85   Cardiac Enzymes:  Recent Labs Lab 07/30/13 1400  TROPONINI <0.30   Urinalysis:  Recent Labs Lab 07/30/13 1415  COLORURINE YELLOW  LABSPEC 1.005  PHURINE 5.5  GLUCOSEU NEGATIVE  HGBUR NEGATIVE  BILIRUBINUR NEGATIVE  KETONESUR NEGATIVE  PROTEINUR NEGATIVE  UROBILINOGEN 0.2  NITRITE NEGATIVE  LEUKOCYTESUR TRACE*   Lipid Panel    Component Value Date/Time   CHOL 234* 07/31/2013 0420   TRIG 401* 07/31/2013 0420   HDL 30* 07/31/2013 0420   CHOLHDL 7.8 07/31/2013 0420   VLDL UNABLE TO CALCULATE IF TRIGLYCERIDE OVER 400 mg/dL 07/31/2013 0420   LDLCALC UNABLE TO CALCULATE IF TRIGLYCERIDE OVER 400 mg/dL 07/31/2013 0420   HgbA1C .resu Lab Results  Component Value Date   HGBA1C 8.9* 07/31/2013   Urine Drug Screen:     Component Value Date/Time   LABOPIA NONE DETECTED 09/17/2008 1200   COCAINSCRNUR NONE DETECTED 09/17/2008 1200   LABBENZ NONE DETECTED 09/17/2008 1200   AMPHETMU NONE DETECTED 09/17/2008  1200   THCU NONE DETECTED 09/17/2008 1200   LABBARB  Value: NONE DETECTED        DRUG SCREEN FOR MEDICAL PURPOSES ONLY.  IF CONFIRMATION IS NEEDED FOR ANY PURPOSE, NOTIFY LAB WITHIN 5 DAYS.        LOWEST DETECTABLE LIMITS FOR URINE DRUG SCREEN Drug Class       Cutoff (ng/mL) Amphetamine      1000 Barbiturate      200 Benzodiazepine   161 Tricyclics       096 Opiates          300 Cocaine          300 THC              50 09/17/2008 1200    Alcohol Level: No results found for this basename: ETH,  in the last 168 hours  Ct Head Wo Contrast  07/30/2013   CLINICAL DATA:  Right arm numbness with slurred speech 1 hr go. Symptoms in a resolved.  EXAM: CT HEAD WITHOUT CONTRAST  TECHNIQUE: Contiguous axial images were obtained from the base of the skull through the vertex without intravenous contrast.  COMPARISON:  Prior CT from 12/11/2005.  FINDINGS: There is no acute intracranial hemorrhage or large vessel territory infarct. Focal encephalomalacia present within the left occipital lobe, consistent with remote left PCA territory infarct. Additional wedge-shaped hypodensity within the left cerebellar hemisphere also compatible with remote infarct.  No mass lesion or midline shift. Gray-white matter differentiation is well maintained. Ventricles are normal in size without evidence of hydrocephalus. CSF containing spaces are within normal limits. No extra-axial fluid collection.  The calvarium is intact.  Orbital soft tissues are within normal limits.  There is partial opacification of the right sphenoid sinus. Paranasal sinuses are otherwise clear. No mastoid effusion.  Scalp soft tissues are unremarkable.  IMPRESSION: 1. No acute intracranial hemorrhage or large vessel territory infarct identified. 2. Encephalomalacia within the left occipital lobe, compatible with remote left PCA territory infarct. This finding is new as compared to prior CT from 2007. 3. Remote left cerebellar infarct, also new from 2007.    Electronically Signed   By: Jeannine Boga M.D.   On: 07/30/2013 14:14   Mr Angiogram Neck W Wo Contrast  07/31/2013   CLINICAL DATA:  New onset right upper extremity weakness and numbness and dysarthria. Stroke.  EXAM: MRI HEAD WITHOUT AND WITH CONTRAST  MRA HEAD WITHOUT CONTRAST  MRA NECK WITHOUT AND WITH CONTRAST  TECHNIQUE: Multiplanar, multiecho pulse sequences of the brain and surrounding structures were obtained without and with intravenous contrast. Angiographic images of the Circle of Willis were  obtained using MRA technique without intravenous contrast. Angiographic images of the neck were obtained using MRA technique without and with intravenous contrast. Carotid stenosis measurements (when applicable) are obtained utilizing NASCET criteria, using the distal internal carotid diameter as the denominator.  CONTRAST:  51mL MULTIHANCE GADOBENATE DIMEGLUMINE 529 MG/ML IV SOLN  COMPARISON:  Head CT 07/30/2013  FINDINGS: MRI HEAD FINDINGS  There is an approximately 2.5 cm region of restricted diffusion involving the posterior left insula consistent with acute infarct. There are several punctate foci of acute cortical infarct in the left parietal lobe. There is an 8 mm acute subcortical infarct in the right parietal lobe. There is no evidence of intracranial hemorrhage, mass, midline shift, or extra-axial fluid collection. Old left occipital and left cerebellar infarcts are seen. There is mild cerebral atrophy. There is a 6 mm focus of T2 hyperintensity and enhancement in the superior left cerebellum. A pineal cyst with single, thin internal septation measures 12 x 12 x 20 mm without abnormal enhancement.  Small mucous retention cyst is noted in the left sphenoid sinus. Trace left mastoid fluid is present. Orbits are unremarkable. Major intracranial vascular flow voids are preserved.  MRA HEAD FINDINGS  Images are mildly degraded by motion. Visualized distal vertebral arteries are patent and codominant.  PICA origins are patent. SCA origins are patent. There is mild irregularity and attenuation of PCA branch vessels without evidence of significant proximal stenosis. Internal carotid arteries are patent from skullbase to carotid termini. Proximal ACAs and MCAs are without stenosis. There is mild branch vessel irregularity and attenuation involving the ACAs and MCAs bilaterally.  MRA NECK FINDINGS  Images are mildly degraded by motion. Three vessel aortic arch. Subclavian arteries are unremarkable. Common carotid and cervical internal carotid arteries are patent without evidence of stenosis. Antegrade flow is present in both vertebral arteries, which are codominant. The proximal vertebral arteries are not well evaluated, with suggestion of moderate luminal narrowing on the right and mild narrowing on the left.  IMPRESSION: 1. Small, acute infarct in the posterior left insula with additional, subcentimeter acute infarcts in both parietal lobes. Query central emboli. 2. Old left occipital and left cerebellar infarcts. 6 mm enhancing focus in the left cerebellum may reflect a subacute infarct. Attention on follow-up. 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 4. No evidence of major intracranial arterial occlusion or significant proximal stenosis. Anterior and posterior circulation branch vessel attenuation may reflect a combination of motion artifact and mild intracranial atherosclerosis. 5. No common carotid or cervical internal carotid artery stenosis. 6. Patent and codominant vertebral arteries with evidence of moderate proximal luminal narrowing on the right and mild narrowing on the left.   Electronically Signed   By: Logan Bores   On: 07/31/2013 13:30   Mr Brain W Wo Contrast  07/31/2013   CLINICAL DATA:  New onset right upper extremity weakness and numbness and dysarthria. Stroke.  EXAM: MRI HEAD WITHOUT AND WITH CONTRAST  MRA HEAD WITHOUT CONTRAST  MRA NECK WITHOUT AND WITH CONTRAST  TECHNIQUE: Multiplanar,  multiecho pulse sequences of the brain and surrounding structures were obtained without and with intravenous contrast. Angiographic images of the Circle of Willis were obtained using MRA technique without intravenous contrast. Angiographic images of the neck were obtained using MRA technique without and with intravenous contrast. Carotid stenosis measurements (when applicable) are obtained utilizing NASCET criteria, using the distal internal carotid diameter as the denominator.  CONTRAST:  32mL MULTIHANCE GADOBENATE DIMEGLUMINE 529 MG/ML IV SOLN  COMPARISON:  Head CT 07/30/2013  FINDINGS: MRI HEAD FINDINGS  There is an approximately 2.5 cm region of restricted diffusion involving the posterior left insula consistent with acute infarct. There are several punctate foci of acute cortical infarct in the left parietal lobe. There is an 8 mm acute subcortical infarct in the right parietal lobe. There is no evidence of intracranial hemorrhage, mass, midline shift, or extra-axial fluid collection. Old left occipital and left cerebellar infarcts are seen. There is mild cerebral atrophy. There is a 6 mm focus of T2 hyperintensity and enhancement in the superior left cerebellum. A pineal cyst with single, thin internal septation measures 12 x 12 x 20 mm without abnormal enhancement.  Small mucous retention cyst is noted in the left sphenoid sinus. Trace left mastoid fluid is present. Orbits are unremarkable. Major intracranial vascular flow voids are preserved.  MRA HEAD FINDINGS  Images are mildly degraded by motion. Visualized distal vertebral arteries are patent and codominant. PICA origins are patent. SCA origins are patent. There is mild irregularity and attenuation of PCA branch vessels without evidence of significant proximal stenosis. Internal carotid arteries are patent from skullbase to carotid termini. Proximal ACAs and MCAs are without stenosis. There is mild branch vessel irregularity and attenuation involving the  ACAs and MCAs bilaterally.  MRA NECK FINDINGS  Images are mildly degraded by motion. Three vessel aortic arch. Subclavian arteries are unremarkable. Common carotid and cervical internal carotid arteries are patent without evidence of stenosis. Antegrade flow is present in both vertebral arteries, which are codominant. The proximal vertebral arteries are not well evaluated, with suggestion of moderate luminal narrowing on the right and mild narrowing on the left.  IMPRESSION: 1. Small, acute infarct in the posterior left insula with additional, subcentimeter acute infarcts in both parietal lobes. Query central emboli. 2. Old left occipital and left cerebellar infarcts. 6 mm enhancing focus in the left cerebellum may reflect a subacute infarct. Attention on follow-up. 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 4. No evidence of major intracranial arterial occlusion or significant proximal stenosis. Anterior and posterior circulation branch vessel attenuation may reflect a combination of motion artifact and mild intracranial atherosclerosis. 5. No common carotid or cervical internal carotid artery stenosis. 6. Patent and codominant vertebral arteries with evidence of moderate proximal luminal narrowing on the right and mild narrowing on the left.   Electronically Signed   By: Logan Bores   On: 07/31/2013 13:30   Mr Jodene Nam Head/brain Wo Cm  07/31/2013   CLINICAL DATA:  New onset right upper extremity weakness and numbness and dysarthria. Stroke.  EXAM: MRI HEAD WITHOUT AND WITH CONTRAST  MRA HEAD WITHOUT CONTRAST  MRA NECK WITHOUT AND WITH CONTRAST  TECHNIQUE: Multiplanar, multiecho pulse sequences of the brain and surrounding structures were obtained without and with intravenous contrast. Angiographic images of the Circle of Willis were obtained using MRA technique without intravenous contrast. Angiographic images of the neck were obtained using MRA technique without and with intravenous contrast. Carotid stenosis  measurements (when applicable) are obtained utilizing NASCET criteria, using the distal internal carotid diameter as the denominator.  CONTRAST:  41mL MULTIHANCE GADOBENATE DIMEGLUMINE 529 MG/ML IV SOLN  COMPARISON:  Head CT 07/30/2013  FINDINGS: MRI HEAD FINDINGS  There is an approximately 2.5 cm region of restricted diffusion involving the posterior left insula consistent with acute infarct. There are several punctate foci of acute cortical infarct in the left parietal lobe. There is an 8 mm acute subcortical infarct in the right parietal lobe.  There is no evidence of intracranial hemorrhage, mass, midline shift, or extra-axial fluid collection. Old left occipital and left cerebellar infarcts are seen. There is mild cerebral atrophy. There is a 6 mm focus of T2 hyperintensity and enhancement in the superior left cerebellum. A pineal cyst with single, thin internal septation measures 12 x 12 x 20 mm without abnormal enhancement.  Small mucous retention cyst is noted in the left sphenoid sinus. Trace left mastoid fluid is present. Orbits are unremarkable. Major intracranial vascular flow voids are preserved.  MRA HEAD FINDINGS  Images are mildly degraded by motion. Visualized distal vertebral arteries are patent and codominant. PICA origins are patent. SCA origins are patent. There is mild irregularity and attenuation of PCA branch vessels without evidence of significant proximal stenosis. Internal carotid arteries are patent from skullbase to carotid termini. Proximal ACAs and MCAs are without stenosis. There is mild branch vessel irregularity and attenuation involving the ACAs and MCAs bilaterally.  MRA NECK FINDINGS  Images are mildly degraded by motion. Three vessel aortic arch. Subclavian arteries are unremarkable. Common carotid and cervical internal carotid arteries are patent without evidence of stenosis. Antegrade flow is present in both vertebral arteries, which are codominant. The proximal vertebral  arteries are not well evaluated, with suggestion of moderate luminal narrowing on the right and mild narrowing on the left.  IMPRESSION: 1. Small, acute infarct in the posterior left insula with additional, subcentimeter acute infarcts in both parietal lobes. Query central emboli. 2. Old left occipital and left cerebellar infarcts. 6 mm enhancing focus in the left cerebellum may reflect a subacute infarct. Attention on follow-up. 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 4. No evidence of major intracranial arterial occlusion or significant proximal stenosis. Anterior and posterior circulation branch vessel attenuation may reflect a combination of motion artifact and mild intracranial atherosclerosis. 5. No common carotid or cervical internal carotid artery stenosis. 6. Patent and codominant vertebral arteries with evidence of moderate proximal luminal narrowing on the right and mild narrowing on the left.   Electronically Signed   By: Logan Bores   On: 07/31/2013 13:30   2D Echocardiogram   - Left ventricle: The cavity size was normal. Wall thickness was normal. Systolic function was normal. The estimated ejection fraction was in the range of 55% to 60%. Wall motion was normal; there were no regional wall motion abnormalities. Doppler parameters are consistent with abnormal left ventricular relaxation (grade 1 diastolic dysfunction). Doppler parameters are consistent with high ventricular filling pressure. - Mitral valve: There was mild regurgitation. Impressions: - Normal LV function; grade 1 diastolic dysfunction; mild MR.  EKG  Sinus tachy  PT/OT Recommendations not being seen  Physical Exam Temp:  [98.3 F (36.8 C)-99.3 F (37.4 C)] 98.3 F (36.8 C) (07/24 1434) Pulse Rate:  [96-115] 96 (07/24 1434) Resp:  [16-20] 18 (07/24 1434) BP: (133-161)/(79-102) 136/90 mmHg (07/24 1434) SpO2:  [94 %-99 %] 98 % (07/24 1434)  General - obese, well developed, anxious.  Ophthalmologic - Sharp  disc margins OU.  Cardiovascular - Regular rate and rhythm with no murmur.  Lung - clear to auscultation bilaterally   Mental Status -  Level of arousal and orientation to time, place, and person were intact. Language including expression, naming, repetition, comprehension, reading was assessed and found intact.  Cranial Nerves II - XII - II - Vision intact OU. Visual fields intact . III, IV, VI - PERRL, extraocular movements intact. V - Facial sensation intact bilaterally. VII - Facial movement  intact bilaterally. VIII - Hearing & vestibular intact bilaterally. X - Palate elevates symmetrically. XI - Chin turning & shoulder shrug intact bilaterally. XII - Tongue protrusion intact.  Motor Strength - The patient's strength was normal in all extremities and pronator drift was absent.  Bulk was normal and fasciculations were absent.    Motor Tone - Muscle tone was assessed at the neck and appendages and was normal. Reflexes - The patient's reflexes were normal in all extremities and she had no pathological reflexes.  Sensory - Light touch, temperature/pinprick, vibration and proprioception were assessed and were normal.    Coordination - The patient had normal movements in the hands and feet with no ataxia or dysmetria.  Tremor was absent.  Gait and Station - The patient's transfers, posture, gait, station, and turns were observed as normal.  ASSESSMENT Ms. [name] is a 48 y.o. female presenting with transient right arm and leg weakness numbness slurry speech and mild right facial droop, resolved in 81min. MRI showed multiple small or moderate size acute and subacute infarction bilaterally, consistent with cardioembolic stroke. So far, MRA unremarkable, 2D echo and LE DVT unremarkable. 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 However, pt refused further  inpt work up, request outpt follow up even though I had highly recommended inpt work up. I have given her my bussiness card for her to call and schedule appointment ASAP.   She also has multiple stroke risk factors:  A1C 8.9, on metformin  LDL not able to calculate due to high TG. Total choles also high  Current long term smoker not willing to quit.  Hx of PE and DVT without reason  TREATMENT/PLAN  Continue plavix and start statin for secondary stroke prevention.  Aggressive stroke risk factor modification  DM and HLD not in good control, continue medication and make appointment with PCP in one week.  she need further test with TEE, hypercoagulable work up, TCD bubble and possible pan CT. However, pt refused further inpt work up, request outpt follow up even though I had highly recommended inpt work up. I have given her my bussiness card for her to call and schedule appointment ASAP.   Hypercoagulable work up and urine tox before discharge.  I had long discussion with pt regarding Her stroke, risk factors, prognosis, treatment plan and answered all questions  Pt refused to stay in hospital and will follow up as outpt  Smoking cessation counseling provided but pt not willing to quit   SIGNED Rosalin Hawking, MD PhD 07/31/2013 6:00 PM     To contact Stroke Continuity provider, please refer to [email or web address] After hours, contact General Neurology

## 2013-07-31 NOTE — Progress Notes (Signed)
PT Discharge  Note  Patient Details Name: [name] MRN: [id number] DOB: July 28, 1965   Cancelled Treatment:    Reason Eval/Treat Not Completed: PT screened, no needs identified, will sign off.  Pt is at baseline function with full resolution of symptoms. 07/31/2013  Donnella Sham, [place] [phone]  (pager)   [name], [name] 07/31/2013, 11:39 AM

## 2013-07-31 NOTE — Progress Notes (Signed)
VASCULAR LAB PRELIMINARY  PRELIMINARY  PRELIMINARY  PRELIMINARY  Bilateral lower extremity venous duplex  completed.    Preliminary report:  Bilateral:  No evidence of DVT, superficial thrombosis, or Baker's Cyst.    Divonte Senger, RVT 07/31/2013, 3:18 PM

## 2013-07-31 NOTE — Progress Notes (Signed)
SLP Cancellation Note  Patient Details Name: [name] MRN: [id number] DOB: [birth year]   Cancelled treatment:       Reason Eval/Treat Not Completed: SLP screened, no needs identified, will sign off. Pt and husband present report that she is at her baseline.    Germain Osgood, M.A. CCC-SLP [phone]  Germain Osgood 07/31/2013, 2:40 PM

## 2013-07-31 NOTE — Discharge Summary (Addendum)
Physician Discharge Summary  [name] MRN: [id number] DOB/AGE: [birth year] 48 y.o.  PCP: Thressa Sheller, MD   Admit date: 07/30/2013 Discharge date: 07/31/2013  Discharge Diagnoses:       TIA (transient ischemic attack) previous left cerebellar and left PCA territory stroke Hypertriglyceridemia Nicotine dependence   Chronic ischemic left PCA stroke   Posterior circulation stroke of uncertain pathology    Follow up recommendations #1 followup lipid panel in 2 months #2 nicotine cessation strongly recommended  #3 followup hemoglobin A1c and hypercoagulable panel TCD with bubble study     Medication List    STOP taking these medications       azithromycin 250 MG tablet  Commonly known as:  ZITHROMAX Z-PAK     chlorpheniramine-HYDROcodone 10-8 MG/5ML Lqcr  Commonly known as:  TUSSIONEX PENNKINETIC ER     doxycycline 100 MG capsule  Commonly known as:  VIBRAMYCIN     FLUoxetine 20 MG tablet  Commonly known as:  PROZAC     oxyCODONE-acetaminophen 5-325 MG per tablet  Commonly known as:  PERCOCET/ROXICET     predniSONE 10 MG tablet  Commonly known as:  DELTASONE      TAKE these medications       albuterol 108 (90 BASE) MCG/ACT inhaler  Commonly known as:  PROVENTIL HFA;VENTOLIN HFA  Inhale 2 puffs into the lungs every 4 (four) hours as needed for wheezing or shortness of breath.     ALPRAZolam 1 MG tablet  Commonly known as:  XANAX  Take 1 mg by mouth 2 (two) times daily as needed for anxiety.     clopidogrel 75 MG tablet  Commonly known as:  PLAVIX  Take 1 tablet (75 mg total) by mouth daily.     diphenhydramine-acetaminophen 25-500 MG Tabs  Commonly known as:  TYLENOL PM  Take 1 tablet by mouth at bedtime.     fenofibrate 160 MG tablet  Take 1 tablet (160 mg total) by mouth daily.     metFORMIN 500 MG 24 hr tablet  Commonly known as:  GLUCOPHAGE-XR  Take 1 tablet (500 mg total) by mouth daily with breakfast.     PARoxetine 20 MG  tablet  Commonly known as:  PAXIL  Take 2 tablets (40 mg total) by mouth daily.     promethazine 25 MG tablet  Commonly known as:  PHENERGAN  Take 25 mg by mouth every 6 (six) hours as needed for nausea or vomiting.     SUMAtriptan 100 MG tablet  Commonly known as:  IMITREX  Take 100 mg by mouth as needed. For migraines    lipitor 20 mg daily    Discharge Condition:   Disposition: 01-Home or Self Care   Consults: *Neurology   Significant Diagnostic Studies: Ct Head Wo Contrast  07/30/2013   CLINICAL DATA:  Right arm numbness with slurred speech 1 hr go. Symptoms in a resolved.  EXAM: CT HEAD WITHOUT CONTRAST  TECHNIQUE: Contiguous axial images were obtained from the base of the skull through the vertex without intravenous contrast.  COMPARISON:  Prior CT from 12/11/2005.  FINDINGS: There is no acute intracranial hemorrhage or large vessel territory infarct. Focal encephalomalacia present within the left occipital lobe, consistent with remote left PCA territory infarct. Additional wedge-shaped hypodensity within the left cerebellar hemisphere also compatible with remote infarct.  No mass lesion or midline shift. Gray-white matter differentiation is well maintained. Ventricles are normal in size without evidence of hydrocephalus. CSF containing spaces are within normal limits.  No extra-axial fluid collection.  The calvarium is intact.  Orbital soft tissues are within normal limits.  There is partial opacification of the right sphenoid sinus. Paranasal sinuses are otherwise clear. No mastoid effusion.  Scalp soft tissues are unremarkable.  IMPRESSION: 1. No acute intracranial hemorrhage or large vessel territory infarct identified. 2. Encephalomalacia within the left occipital lobe, compatible with remote left PCA territory infarct. This finding is new as compared to prior CT from 2007. 3. Remote left cerebellar infarct, also new from 2007.   Electronically Signed   By: Jeannine Boga  M.D.   On: 07/30/2013 14:14       Microbiology: No results found for this or any previous visit (from the past 240 hour(s)).   Labs: Results for orders placed during the hospital encounter of 07/30/13 (from the past 48 hour(s))  TROPONIN I     Status: None   Collection Time    07/30/13  2:00 PM      Result Value Ref Range   Troponin I <0.30  <0.30 ng/mL   Comment:            Due to the release kinetics of cTnI,     a negative result within the first hours     of the onset of symptoms does not rule out     myocardial infarction with certainty.     If myocardial infarction is still suspected,     repeat the test at appropriate intervals.  APTT     Status: None   Collection Time    07/30/13  2:00 PM      Result Value Ref Range   aPTT 29  24 - 37 seconds  PROTIME-INR     Status: None   Collection Time    07/30/13  2:00 PM      Result Value Ref Range   Prothrombin Time 11.6  11.6 - 15.2 seconds   INR 0.85  0.00 - 1.49  COMPREHENSIVE METABOLIC PANEL     Status: Abnormal   Collection Time    07/30/13  2:00 PM      Result Value Ref Range   Sodium 137  137 - 147 mEq/L   Potassium 3.9  3.7 - 5.3 mEq/L   Chloride 99  96 - 112 mEq/L   CO2 21  19 - 32 mEq/L   Glucose, Bld 164 (*) 70 - 99 mg/dL   BUN 15  6 - 23 mg/dL   Creatinine, Ser 1.10  0.50 - 1.10 mg/dL   Calcium 9.0  8.4 - 10.5 mg/dL   Total Protein 6.8  6.0 - 8.3 g/dL   Albumin 3.6  3.5 - 5.2 g/dL   AST 14  0 - 37 U/L   ALT 15  0 - 35 U/L   Alkaline Phosphatase 72  39 - 117 U/L   Total Bilirubin <0.2 (*) 0.3 - 1.2 mg/dL   GFR calc non Af Amer 58 (*) >90 mL/min   GFR calc Af Amer 67 (*) >90 mL/min   Comment: (NOTE)     The eGFR has been calculated using the CKD EPI equation.     This calculation has not been validated in all clinical situations.     eGFR's persistently <90 mL/min signify possible Chronic Kidney     Disease.   Anion gap 17 (*) 5 - 15  CBC     Status: Abnormal   Collection Time    07/30/13  2:00 PM  Result Value Ref Range   WBC 11.1 (*) 4.0 - 10.5 K/uL   RBC 4.69  3.87 - 5.11 MIL/uL   Hemoglobin 15.3 (*) 12.0 - 15.0 g/dL   HCT 45.4  36.0 - 46.0 %   MCV 96.8  78.0 - 100.0 fL   MCH 32.6  26.0 - 34.0 pg   MCHC 33.7  30.0 - 36.0 g/dL   RDW 13.9  11.5 - 15.5 %   Platelets 216  150 - 400 K/uL  URINALYSIS, ROUTINE W REFLEX MICROSCOPIC     Status: Abnormal   Collection Time    07/30/13  2:15 PM      Result Value Ref Range   Color, Urine YELLOW  YELLOW   APPearance CLEAR  CLEAR   Specific Gravity, Urine 1.005  1.005 - 1.030   pH 5.5  5.0 - 8.0   Glucose, UA NEGATIVE  NEGATIVE mg/dL   Hgb urine dipstick NEGATIVE  NEGATIVE   Bilirubin Urine NEGATIVE  NEGATIVE   Ketones, ur NEGATIVE  NEGATIVE mg/dL   Protein, ur NEGATIVE  NEGATIVE mg/dL   Urobilinogen, UA 0.2  0.0 - 1.0 mg/dL   Nitrite NEGATIVE  NEGATIVE   Leukocytes, UA TRACE (*) NEGATIVE  URINE MICROSCOPIC-ADD ON     Status: None   Collection Time    07/30/13  2:15 PM      Result Value Ref Range   Squamous Epithelial / LPF RARE  RARE   WBC, UA 0-2  <3 WBC/hpf   RBC / HPF 0-2  <3 RBC/hpf   Bacteria, UA RARE  RARE  LIPID PANEL     Status: Abnormal   Collection Time    07/31/13  4:20 AM      Result Value Ref Range   Cholesterol 234 (*) 0 - 200 mg/dL   Triglycerides 401 (*) <150 mg/dL   HDL 30 (*) >39 mg/dL   Total CHOL/HDL Ratio 7.8     VLDL UNABLE TO CALCULATE IF TRIGLYCERIDE OVER 400 mg/dL  0 - 40 mg/dL   LDL Cholesterol UNABLE TO CALCULATE IF TRIGLYCERIDE OVER 400 mg/dL  0 - 99 mg/dL   Comment:            Total Cholesterol/HDL:CHD Risk     Coronary Heart Disease Risk Table                         Men   Women      1/2 Average Risk   3.4   3.3      Average Risk       5.0   4.4      2 X Average Risk   9.6   7.1      3 X Average Risk  23.4   11.0                Use the calculated Patient Ratio     above and the CHD Risk Table     to determine the patient's CHD Risk.                ATP III CLASSIFICATION (LDL):       <100     mg/dL   Optimal      100-129  mg/dL   Near or Above                        Optimal      130-159  mg/dL  Borderline      160-189  mg/dL   High      >190     mg/dL   Very High  ANTITHROMBIN III     Status: None   Collection Time    07/31/13  9:54 AM      Result Value Ref Range   AntiThromb III Func 110  75 - 120 %     HPI : [name] is a 48 y.o. female with a history of PE, DM who presents with transient right arm numbness that lasted for approximately 15 minutes. She states that she is currently back to baseline. She describes a sensation as her right arm just going completely numb, and flopping around without much control. She denies any previous history of similar events.  Of note, she is allergic to aspirin.  CT scan shows to previous strokes, though there are no clear historical clues to when these might of happened. They are new since a 2007 CT scan.   HOSPITAL COURSE:  TIA/transient right arm weakness and numbness consistent with a transient ischemic attack HgbA1c pending, fasting lipid panel shows triglycerides of 401, total cholesterol 234 2. MRI, MRA of the brain without contrast, MRA of the neck with contrast shows Small, acute infarct in the posterior left insula with additional, subcentimeter acute infarcts in both parietal lobes,Old left occipital and left cerebellar infarcts. 6 mm enhancing  focus in the left cerebellum may reflect a subacute infarct Bilateral lower extremity venous duplex ,No evidence of DVT, superficial thrombosis, or Baker's Cyst 3. neurochecks remained stable 4. Echocardiogram pending 6. Prophylactic therapy-Antiplatelet med: Plavix 75 mg daily, patient is allergic to aspirin  7. Risk factor modification nicotine cessation strongly advised strongly advised 8. Telemetry monitoring remained stable 9. PT consult, OT consult, Speech consult, no PT/OT followup needed swallowing is okay TCD with bubble study recommended by Dr  Erlinda Hong  Diabetes mellitus Hemoglobin A1c 8.9 Continue metformin   Anxiety/depression Continue Paxil, Xanax   Hypertriglyceridemia Recommend checking liver function and lipid panel in about 2 months Start the patient on TriCorand lipitor   Discharge Exam:   Blood pressure 145/91, pulse 102, temperature 99 F (37.2 C), temperature source Oral, resp. rate 18, height 5' 6"  (1.676 m), weight 113.399 kg (250 lb), last menstrual period 07/23/2013, SpO2 94.00%.  General Appearance:  Alert, oriented, understandably anxious after being informed about findings, appears stated age   Head:  Normocephalic, atraumatic   Eyes:  PERRL, EOMI, sclera non-icteric      Nose:  Nares without drainage or epistaxis. Mucosa, turbinates normal   Throat:  Moist mucous membranes. Oropharynx without erythema or exudate.   Neck:  Supple. No carotid bruits. No thyromegaly. No lymphadenopathy.   Back:  No CVA tenderness, no spinal tenderness   Lungs:  Clear to auscultation bilaterally, without wheezes, rhonchi or rales   Chest wall:  No tenderness to palpitation   Heart:  Regular rate and rhythm without murmurs, gallops, rubs   Abdomen:  Soft, non-tender, nondistended, normal bowel sounds, no organomegaly   Genitalia:  deferred   Rectal:  deferred   Extremities:  No clubbing, cyanosis or edema.   Pulses:  2+ and symmetric all extremities   Skin:  Skin color, texture, turgor normal, no rashes or lesions   Lymph nodes:  Cervical, supraclavicular, and axillary nodes normal   Neurologic:  CNII-XII intact. Normal strength, sensation and reflexes  throughout             Follow-up  Information   Follow up with Thressa Sheller, MD. Schedule an appointment as soon as possible for a visit in 1 week.   Specialty:  Internal Medicine   Contact information:   [place], [place] [address] [phone]       Signed: [name] 07/31/2013, 10:43 AM

## 2013-08-01 LAB — VITAMIN B12: VITAMIN B 12: 379 pg/mL (ref 211–911)

## 2013-08-01 LAB — RPR

## 2013-08-01 LAB — T4, FREE: Free T4: 1.14 ng/dL (ref 0.80–1.80)

## 2013-08-01 LAB — FOLATE: Folate: 12.2 ng/mL

## 2013-08-01 LAB — C3 COMPLEMENT: C3 Complement: 159 mg/dL (ref 90–180)

## 2013-08-01 LAB — HIV ANTIBODY (ROUTINE TESTING W REFLEX): HIV 1&2 Ab, 4th Generation: NONREACTIVE

## 2013-08-01 LAB — C4 COMPLEMENT: Complement C4, Body Fluid: 26 mg/dL (ref 10–40)

## 2013-08-03 LAB — LUPUS ANTICOAGULANT PANEL
DRVVT: 31.7 s (ref <42.9)
Lupus Anticoagulant: NOT DETECTED
PTT Lupus Anticoagulant: 30.6 secs (ref 28.0–43.0)

## 2013-08-03 LAB — ANTI-DNA ANTIBODY, DOUBLE-STRANDED: ds DNA Ab: 1 IU/mL

## 2013-08-03 LAB — CARDIOLIPIN ANTIBODIES, IGG, IGM, IGA
ANTICARDIOLIPIN IGG: 8 GPL U/mL — AB (ref <23)
Anticardiolipin IgA: 6 APL U/mL — ABNORMAL LOW (ref <22)
Anticardiolipin IgM: 16 MPL U/mL — ABNORMAL HIGH (ref <11)

## 2013-08-03 LAB — ANCA SCREEN W REFLEX TITER
Atypical p-ANCA Screen: NEGATIVE
C-ANCA SCREEN: NEGATIVE
P-ANCA SCREEN: NEGATIVE

## 2013-08-03 LAB — PROTEIN S ACTIVITY: Protein S Activity: 83 % (ref 69–129)

## 2013-08-03 LAB — PROTEIN S, TOTAL: PROTEIN S AG TOTAL: 150 % (ref 60–150)

## 2013-08-03 LAB — PROTEIN C, TOTAL: Protein C, Total: 153 % (ref 72–160)

## 2013-08-03 LAB — BETA-2-GLYCOPROTEIN I ABS, IGG/M/A
Beta-2 Glyco I IgG: 14 G Units (ref <20)
Beta-2-Glycoprotein I IgA: 17 A Units (ref <20)
Beta-2-Glycoprotein I IgM: 27 M Units — ABNORMAL HIGH (ref <20)

## 2013-08-03 LAB — ANA: ANA: NEGATIVE

## 2013-08-03 LAB — SJOGRENS SYNDROME-B EXTRACTABLE NUCLEAR ANTIBODY: SSB (La) (ENA) Antibody, IgG: <1

## 2013-08-03 LAB — SJOGRENS SYNDROME-A EXTRACTABLE NUCLEAR ANTIBODY: SSA (RO) (ENA) ANTIBODY, IGG: NEGATIVE

## 2013-08-03 LAB — MPO/PR-3 (ANCA) ANTIBODIES

## 2013-08-03 LAB — PROTEIN C ACTIVITY: Protein C Activity: 173 % — ABNORMAL HIGH (ref 75–133)

## 2013-08-03 NOTE — Progress Notes (Signed)
UR complete.  Kasie Leccese RN, MSN 

## 2013-08-05 LAB — MTHFR MUTATION DETECTION

## 2013-08-05 LAB — FACTOR 5 LEIDEN

## 2013-08-05 LAB — PROTHROMBIN GENE MUTATION

## 2013-08-12 ENCOUNTER — Emergency Department (HOSPITAL_BASED_OUTPATIENT_CLINIC_OR_DEPARTMENT_OTHER)
Admission: EM | Admit: 2013-08-12 | Discharge: 2013-08-13 | Disposition: A | Discharge status: Home / Self Care | Payer: BC Managed Care – PPO | Attending: Emergency Medicine | Admitting: Emergency Medicine

## 2013-08-12 ENCOUNTER — Ambulatory Visit: Payer: No Typology Code available for payment source | Admitting: Neurology

## 2013-08-12 ENCOUNTER — Encounter (HOSPITAL_BASED_OUTPATIENT_CLINIC_OR_DEPARTMENT_OTHER): Payer: Self-pay | Admitting: Emergency Medicine

## 2013-08-12 ENCOUNTER — Emergency Department (HOSPITAL_BASED_OUTPATIENT_CLINIC_OR_DEPARTMENT_OTHER): Payer: BC Managed Care – PPO

## 2013-08-12 DIAGNOSIS — F329 Major depressive disorder, single episode, unspecified: Secondary | ICD-10-CM | POA: Insufficient documentation

## 2013-08-12 DIAGNOSIS — Z86711 Personal history of pulmonary embolism: Secondary | ICD-10-CM | POA: Insufficient documentation

## 2013-08-12 DIAGNOSIS — Z880 Allergy status to penicillin: Secondary | ICD-10-CM | POA: Insufficient documentation

## 2013-08-12 DIAGNOSIS — Z7902 Long term (current) use of antithrombotics/antiplatelets: Secondary | ICD-10-CM | POA: Insufficient documentation

## 2013-08-12 DIAGNOSIS — Z8673 Personal history of transient ischemic attack (TIA), and cerebral infarction without residual deficits: Secondary | ICD-10-CM | POA: Insufficient documentation

## 2013-08-12 DIAGNOSIS — F3289 Other specified depressive episodes: Secondary | ICD-10-CM | POA: Insufficient documentation

## 2013-08-12 DIAGNOSIS — I10 Essential (primary) hypertension: Secondary | ICD-10-CM | POA: Insufficient documentation

## 2013-08-12 DIAGNOSIS — F17200 Nicotine dependence, unspecified, uncomplicated: Secondary | ICD-10-CM | POA: Insufficient documentation

## 2013-08-12 DIAGNOSIS — F419 Anxiety disorder, unspecified: Secondary | ICD-10-CM

## 2013-08-12 DIAGNOSIS — E119 Type 2 diabetes mellitus without complications: Secondary | ICD-10-CM | POA: Insufficient documentation

## 2013-08-12 DIAGNOSIS — F411 Generalized anxiety disorder: Secondary | ICD-10-CM | POA: Insufficient documentation

## 2013-08-12 DIAGNOSIS — Z3202 Encounter for pregnancy test, result negative: Secondary | ICD-10-CM | POA: Insufficient documentation

## 2013-08-12 DIAGNOSIS — R000 Tachycardia, unspecified: Secondary | ICD-10-CM | POA: Insufficient documentation

## 2013-08-12 DIAGNOSIS — Z8719 Personal history of other diseases of the digestive system: Secondary | ICD-10-CM | POA: Insufficient documentation

## 2013-08-12 HISTORY — DX: Cerebral infarction, unspecified: I63.9

## 2013-08-12 HISTORY — DX: Essential (primary) hypertension: I10

## 2013-08-12 LAB — BASIC METABOLIC PANEL
Anion gap: 14 (ref 5–15)
BUN: 20 mg/dL (ref 6–23)
CO2: 25 meq/L (ref 19–32)
Calcium: 9.7 mg/dL (ref 8.4–10.5)
Chloride: 96 mEq/L (ref 96–112)
Creatinine, Ser: 1.1 mg/dL (ref 0.50–1.10)
GFR calc non Af Amer: 58 mL/min — ABNORMAL LOW (ref >90)
GFR, EST AFRICAN AMERICAN: 67 mL/min — AB (ref >90)
GLUCOSE: 303 mg/dL — AB (ref 70–99)
POTASSIUM: 4 meq/L (ref 3.7–5.3)
Sodium: 135 mEq/L — ABNORMAL LOW (ref 137–147)

## 2013-08-12 LAB — CBC WITH DIFFERENTIAL/PLATELET
Basophils Absolute: 0.1 10*3/uL (ref 0.0–0.1)
Basophils Relative: 0 % (ref 0–1)
Eosinophils Absolute: 0.2 10*3/uL (ref 0.0–0.7)
Eosinophils Relative: 1 % (ref 0–5)
HEMATOCRIT: 47.4 % — AB (ref 36.0–46.0)
HEMOGLOBIN: 16.1 g/dL — AB (ref 12.0–15.0)
LYMPHS ABS: 2.8 10*3/uL (ref 0.7–4.0)
Lymphocytes Relative: 23 % (ref 12–46)
MCH: 33.1 pg (ref 26.0–34.0)
MCHC: 34 g/dL (ref 30.0–36.0)
MCV: 97.5 fL (ref 78.0–100.0)
MONO ABS: 0.8 10*3/uL (ref 0.1–1.0)
MONOS PCT: 7 % (ref 3–12)
NEUTROS ABS: 8.2 10*3/uL — AB (ref 1.7–7.7)
Neutrophils Relative %: 68 % (ref 43–77)
Platelets: 294 10*3/uL (ref 150–400)
RBC: 4.86 MIL/uL (ref 3.87–5.11)
RDW: 13.8 % (ref 11.5–15.5)
WBC: 12 10*3/uL — ABNORMAL HIGH (ref 4.0–10.5)

## 2013-08-12 LAB — TROPONIN I

## 2013-08-12 LAB — PREGNANCY, URINE: Preg Test, Ur: NEGATIVE

## 2013-08-12 NOTE — ED Provider Notes (Signed)
CSN: [id number]     Arrival date & time 08/12/13  2222 History  This chart was scribed for Katrice Goel K Kellin Bartling-Rasch, MD by Donato Schultz, ED Scribe. This patient was seen in room MH02/MH02 and the patient's care was started at 11:02 PM.     Chief Complaint  Patient presents with  . Hypertension    Patient is a 48 y.o. female presenting with hypertension. The history is provided by the patient. No language interpreter was used.  Hypertension This is a chronic problem. The current episode started more than 1 week ago. The problem occurs constantly. The problem has been gradually worsening. Associated symptoms include shortness of breath. Pertinent negatives include no chest pain, no abdominal pain and no headaches. Nothing aggravates the symptoms. Nothing relieves the symptoms. She has tried nothing for the symptoms. The treatment provided no relief.   HPI Comments: [name] is a 48 y.o. female who presents to the Emergency Department complaining of an elevated her blood pressure.  She gets panicked when she feels her hands go numb.  The episode did not last very long.  She states that she has a prescription to take Xanax twice a day.  She denies leg swelling and chest pain as associated symptoms.  She was SOB 1 hour PTA.  Her PCP is Dr. Noah Delaine.  She has an appointment in 2 weeks to see Dr. Theresia Lo at [facility] due to an acute TIA she had 3 weeks ago.  She was not left with any deficits after her stroke.  She did not have any TPA.  She was placed on Lipitor and Plavix.  She is out of her Metformin but will refill the prescription tomorrow.    Past Medical History  Diagnosis Date  . PE (pulmonary embolism)   . Anxiety   . Diabetes mellitus without complication   . Acid reflux   . Depression   . Adrenal benign tumor     Apparently enlarging and patient "is supposed to have it taken out"  . Hypertension   . Stroke    History reviewed. No pertinent past surgical history. No family  history on file. History  Substance Use Topics  . Smoking status: Current Every Day Smoker -- 1.00 packs/day    Types: Cigarettes  . Smokeless tobacco: Never Used  . Alcohol Use: No   OB History   Grav Para Term Preterm Abortions TAB SAB Ect Mult Living                 Review of Systems  Constitutional: Negative for fever.  Respiratory: Positive for shortness of breath. Negative for chest tightness.   Cardiovascular: Positive for palpitations. Negative for chest pain and leg swelling.  Gastrointestinal: Negative for abdominal pain.  Neurological: Negative for dizziness, seizures, facial asymmetry, speech difficulty, weakness, numbness and headaches.  Psychiatric/Behavioral: The patient is nervous/anxious.   All other systems reviewed and are negative.     Allergies  Amoxicillin; Aspirin; Codeine; and Penicillins  Home Medications   Prior to Admission medications   Medication Sig Start Date End Date Taking? Authorizing Provider  lisinopril (PRINIVIL,ZESTRIL) 20 MG tablet Take 20 mg by mouth daily.   Yes Historical Provider, MD  albuterol (PROVENTIL HFA;VENTOLIN HFA) 108 (90 BASE) MCG/ACT inhaler Inhale 2 puffs into the lungs every 4 (four) hours as needed for wheezing or shortness of breath. 12/06/12   Lucila Maine, PA-C  ALPRAZolam [name]) 1 MG tablet Take 1 mg by mouth 2 (two) times daily  as needed for anxiety.    Historical Provider, MD  atorvastatin (LIPITOR) 20 MG tablet Take 1 tablet (20 mg total) by mouth daily. 07/31/13   [name], MD  clopidogrel (PLAVIX) 75 MG tablet Take 1 tablet (75 mg total) by mouth daily. 07/31/13   [name], MD  diphenhydramine-acetaminophen (TYLENOL PM) 25-500 MG TABS Take 1 tablet by mouth at bedtime.     Historical Provider, MD  fenofibrate 160 MG tablet Take 1 tablet (160 mg total) by mouth daily. 07/31/13   [name], MD  metFORMIN (GLUCOPHAGE-XR) 500 MG 24 hr tablet Take 1 tablet (500 mg total) by mouth daily with breakfast.  10/24/12   Fransico Meadow, PA-C  PARoxetine (PAXIL) 20 MG tablet Take 2 tablets (40 mg total) by mouth daily. 03/30/13   Kaitlyn Szekalski, PA-C  promethazine (PHENERGAN) 25 MG tablet Take 25 mg by mouth every 6 (six) hours as needed for nausea or vomiting.    Historical Provider, MD  SUMAtriptan (IMITREX) 100 MG tablet Take 100 mg by mouth as needed. For migraines 07/04/13   Historical Provider, MD   Triage Vitals: BP 168/97  Pulse 130  Temp(Src) 98.7 F (37.1 C) (Oral)  Resp 22  Ht 5\' 6"  (1.676 m)  Wt 232 lb (105.235 kg)  BMI 37.46 kg/m2  SpO2 98%  LMP 08/05/2013  Physical Exam  Nursing note and vitals reviewed. Constitutional: She is oriented to person, place, and time. She appears well-developed and well-nourished.  HENT:  Head: Normocephalic and atraumatic.  Right Ear: External ear normal.  Left Ear: External ear normal.  Mouth/Throat: Uvula is midline, oropharynx is clear and moist and mucous membranes are normal.  Eyes: Conjunctivae and EOM are normal. Pupils are equal, round, and reactive to light.  Neck: Normal range of motion.  Cardiovascular: Regular rhythm and intact distal pulses.  Tachycardia present.   Pulmonary/Chest: Effort normal and breath sounds normal. No respiratory distress. She has no wheezes. She has no rales.  Abdominal: Soft. Bowel sounds are normal. There is no tenderness. There is no rebound and no guarding.  Musculoskeletal: Normal range of motion.  Neurological: She is alert and oriented to person, place, and time. She has normal reflexes. She displays no atrophy, no tremor and normal reflexes. No cranial nerve deficit or sensory deficit. She exhibits normal muscle tone. She displays no seizure activity. Coordination and gait normal. GCS eye subscore is 4. GCS verbal subscore is 5. GCS motor subscore is 6.  Reflex Scores:      Tricep reflexes are 2+ on the right side and 2+ on the left side.      Bicep reflexes are 2+ on the right side and 2+ on the left  side.      Brachioradialis reflexes are 2+ on the right side and 2+ on the left side.      Patellar reflexes are 2+ on the right side and 2+ on the left side.      Achilles reflexes are 2+ on the right side and 2+ on the left side. 5/5 throughout normal phonation  Skin: Skin is warm and dry.  Psychiatric: Her mood appears anxious. Her speech is rapid and/or pressured.    ED Course  Procedures (including critical care time)  DIAGNOSTIC STUDIES: Oxygen Saturation is 98% on room air, normal by my interpretation.    COORDINATION OF CARE:    Labs Review Labs Reviewed - No data to display  Imaging Review No results found.   EKG Interpretation  Date/Time:  Wednesday August 12 2013 22:49:00 EDT Ventricular Rate:  125 PR Interval:  124 QRS Duration: 68 QT Interval:  304 QTC Calculation: 438 R Axis:   14 Text Interpretation:  Sinus tachycardia Possible Left atrial enlargement  Low voltage QRS Cannot rule out Anterior infarct , age undetermined  Abnormal ECG Rate faster Confirmed by Wyvonnia Dusky  MD, [place] [phone]) on  08/12/2013 10:51:59 PM      MDM   Final diagnoses:  None   Very low suspicion for ACS.  mardkely improved with reassurance only.   Rules out for PE, suspect tachycardia is related to panic.  She take xanax but perhaps propanolol would be a good choice given HTN and multiple episodes of tachycardia.  Neurologically intact.  Follow up with your PMD within 48 hours for recheck  I personally performed the services described in this documentation, which was scribed in my presence. The recorded information has been reviewed and is accurate.     Carlisle Beers, MD 08/13/13 [phone]

## 2013-08-12 NOTE — ED Notes (Signed)
C/o elevated BP x weeks- HA x 2-3 days-also c/o SOB 1 hour PTA

## 2013-08-12 NOTE — ED Notes (Signed)
C/o high bp x weeks and ha today

## 2013-08-13 ENCOUNTER — Emergency Department (HOSPITAL_BASED_OUTPATIENT_CLINIC_OR_DEPARTMENT_OTHER): Payer: BC Managed Care – PPO

## 2013-08-13 MED ORDER — IOHEXOL 350 MG/ML SOLN
100.0000 mL | Freq: Once | INTRAVENOUS | Status: AC | PRN
  Administered 2013-08-13: 100 mL via INTRAVENOUS

## 2013-08-13 NOTE — Discharge Instructions (Signed)
How to Take Your Blood Pressure HOW DO I GET A BLOOD PRESSURE MACHINE?  You can buy an electronic home blood pressure machine at your local pharmacy. Insurance will sometimes cover the cost if you have a prescription.  Ask your doctor what type of machine is best for you. There are different machines for your arm and your wrist.  If you decide to buy a machine to check your blood pressure on your arm, first check the size of your arm so you can buy the right size cuff. To check the size of your arm:   Use a measuring tape that shows both inches and centimeters.   Wrap the measuring tape around the upper-middle part of your arm. You may need someone to help you measure.   Write down your arm measurement in both inches and centimeters.   To measure your blood pressure correctly, it is important to have the right size cuff.   If your arm is up to 13 inches (up to 34 centimeters), get an adult cuff size.  If your arm is 13 to 17 inches (35 to 44 centimeters), get a large adult cuff size.    If your arm is 17 to 20 inches (45 to 52 centimeters), get an adult thigh cuff.  WHAT DO THE NUMBERS MEAN?   There are two numbers that make up your blood pressure. For example: 120/80.  The first number (120 in our example) is called the "systolic pressure." It is a measure of the pressure in your blood vessels when your heart is pumping blood.  The second number (80 in our example) is called the "diastolic pressure." It is a measure of the pressure in your blood vessels when your heart is resting between beats.  Your doctor will tell you what your blood pressure should be. WHAT SHOULD I DO BEFORE I CHECK MY BLOOD PRESSURE?   Try to rest or relax for at least 30 minutes before you check your blood pressure.  Do not smoke.  Do not have any drinks with caffeine, such as:  Soda.  Coffee.  Tea.  Check your blood pressure in a quiet room.  Sit down and stretch out your arm on a table.  Keep your arm at about the level of your heart. Let your arm relax.  Make sure that your legs are not crossed. HOW DO I CHECK MY BLOOD PRESSURE?  Follow the directions that came with your machine.  Make sure you remove any tight-fighting clothing from your arm or wrist. Wrap the cuff around your upper arm or wrist. You should be able to fit a finger between the cuff and your arm. If you cannot fit a finger between the cuff and your arm, it is too tight and should be removed and rewrapped.  Some units require you to manually pump up the arm cuff.  Automatic units inflate the cuff when you press a button.  Cuff deflation is automatic in both models.  After the cuff is inflated, the unit measures your blood pressure and pulse. The readings are shown on a monitor. Hold still and breathe normally while the cuff is inflated.  Getting a reading takes less than a minute.  Some models store readings in a memory. Some provide a printout of readings. If your machine does not store your readings, keep a written record.  Take readings with you to your next visit with your doctor. Document Released: 12/08/2007 Document Revised: 05/11/2013 Document Reviewed: 02/19/2013 [employer] Patient Information   2015 [employer]. This information is not intended to replace advice given to you by your health care provider. Make sure you discuss any questions you have with your health care provider.  

## 2013-08-19 ENCOUNTER — Telehealth: Payer: Self-pay | Admitting: *Deleted

## 2013-08-19 NOTE — Telephone Encounter (Signed)
Called patient and cancelled appointment Lvm letting them know to return my call to reschedule.[name] has a opening today if they can take please schedule then.

## 2013-08-20 NOTE — Telephone Encounter (Signed)
Patient has a appointment on the 18 with Dr. Leonie Man

## 2013-08-24 ENCOUNTER — Ambulatory Visit: Payer: No Typology Code available for payment source | Admitting: Neurology

## 2013-08-26 ENCOUNTER — Ambulatory Visit (INDEPENDENT_AMBULATORY_CARE_PROVIDER_SITE_OTHER): Payer: BC Managed Care – PPO | Admitting: Neurology

## 2013-08-26 ENCOUNTER — Encounter: Payer: Self-pay | Admitting: Neurology

## 2013-08-26 VITALS — BP 116/70 | HR 118 | Temp 97.1°F | Ht 66.0 in | Wt 231.0 lb

## 2013-08-26 DIAGNOSIS — I4891 Unspecified atrial fibrillation: Secondary | ICD-10-CM

## 2013-08-26 DIAGNOSIS — I639 Cerebral infarction, unspecified: Secondary | ICD-10-CM

## 2013-08-26 DIAGNOSIS — I6340 Cerebral infarction due to embolism of unspecified cerebral artery: Secondary | ICD-10-CM

## 2013-08-26 DIAGNOSIS — I480 Paroxysmal atrial fibrillation: Secondary | ICD-10-CM

## 2013-08-26 NOTE — Patient Instructions (Signed)
I had a long discussion with the patient and her husband regarding her recent bicerebral as well as prior silent strokes with strong suspicion for cardiogenic embolism. I personally reviewed imaging studies, hospital stroke evaluation, risk for recurrent strokes and answered questions. Recommend  We check TEE and outpatient cardiac event monitor for paroxysmal atrial fibrillation. Continue Plavix for stroke prevention and strict control of diabetes with hemoglobin A1c goal below 6.5, lipids with LDL cholesterol goal below 100 mg percent and hypertension with blood pressure goes below 130/90. Also advised patient to diet, exercise and lose weight. She was also strongly counseled to quit smoking but she is adamant and reveals refuses to do so. Return for followup in 3 months with Dr. Erlinda Hong or call earlier if necessary

## 2013-08-27 ENCOUNTER — Telehealth: Payer: Self-pay | Admitting: *Deleted

## 2013-08-27 NOTE — Telephone Encounter (Signed)
Echo scheduled @ [place] 08/31/13 @ 11:30

## 2013-08-27 NOTE — Progress Notes (Signed)
[facility] [address] [place]. [place] [address] [phone]       OFFICE FOLLOW-UP NOTE  Ms. Rusty Aus Date of Birth:  09/25/1965 Medical Record Number:  [id number]   HPI: 74 year lady seen today for first office followup following hospital admission for TIA on 07/30/13. She presented with sudden onset of transient numbness right side of the body lasted about 15 minutes and recovered by the time she came to the hospital. She also noticed weakness with the right arm flapping around without much control. CT scan of the head showed no previous stroke MRI scan of the brain shows small acute infarct in the left posterior insula additional subcentimeter acute infarcts in both parietal lobes as well as left occipital and left cerebellar infarcts with enhancing small left cerebellar infarct suggestive of a subacute infarct as well. She'll A1c was elevated at 8.9. She is also a chronic smoker. Carotid Dopplers and transthoracic unremarkable. She had history of DVT and pulmonary embolism in 2009 without any trigger and was treated with anticoagulation for shot.. She is allergic to aspirin and she was started on Plavix following this strokes patient was recommended to have TEE and further stroke workup but she refused further admission and wanted to go home. Hypercoagulable panel labs were sent and were all normal. Cholesterol was elevated at 234, triglycerides 401, LDL could not be calculated. Hearing screen was negative the telemetry monitoring revealed no arrhythmias she states she done well since discharge history choking as well as right-sided numbness of all recovered back to baseline. She has not yet had TEE her bubble study done. She refuses to quit smoking.  ROS:   14 system review of systems is positive for fatigue, leg swelling, excessive thirst, nausea, frequent urination, numbness, headache, depression, anxiety, frequent awakenings, nausea  PMH:  Past Medical History   Diagnosis Date  . PE (pulmonary embolism)   . Anxiety   . Diabetes mellitus without complication   . Acid reflux   . Depression   . Adrenal benign tumor     Apparently enlarging and patient "is supposed to have it taken out"  . Hypertension   . Stroke     Social History:  History   Social History  . Marital Status: Married    Spouse Name: Richard    Number of Children: 2  . Years of Education: HS   Occupational History  .  Other    n/a   Social History Main Topics  . Smoking status: Current Every Day Smoker -- 1.00 packs/day for 40 years    Types: Cigarettes  . Smokeless tobacco: Never Used  . Alcohol Use: No  . Drug Use: No  . Sexual Activity: Not on file   Other Topics Concern  . Not on file   Social History Narrative   Patient lives at home with spouse.   Caffeine Use: 2 cups daily    Medications:   Current Outpatient Prescriptions on File Prior to Visit  Medication Sig Dispense Refill  . ALPRAZolam (XANAX) 1 MG tablet Take 1 mg by mouth 2 (two) times daily as needed for anxiety.      . clopidogrel (PLAVIX) 75 MG tablet Take 1 tablet (75 mg total) by mouth daily.  30 tablet  2  . fenofibrate 160 MG tablet Take 1 tablet (160 mg total) by mouth daily.  30 tablet  2  . lisinopril (PRINIVIL,ZESTRIL) 20 MG tablet Take 20 mg by mouth daily.      Marland Kitchen  metFORMIN (GLUCOPHAGE-XR) 500 MG 24 hr tablet Take 1 tablet (500 mg total) by mouth daily with breakfast.  30 tablet  0  . promethazine (PHENERGAN) 25 MG tablet Take 25 mg by mouth every 6 (six) hours as needed for nausea or vomiting.       No current facility-administered medications on file prior to visit.    Allergies:   Allergies  Allergen Reactions  . Amoxicillin Hives  . Aspirin Hives  . Codeine Hives  . Penicillins Hives    Physical Exam General: Obese middle-age lady, seated, in no evident distress Head: head normocephalic and atraumatic. Orohparynx benign Neck: supple with no carotid or  supraclavicular bruits Cardiovascular: regular rate and rhythm, no murmurs Musculoskeletal: no deformity Skin:  no rash/petichiae Vascular:  Normal pulses all extremities Filed Vitals:   08/26/13 0841  BP: 116/70  Pulse: 118  Temp: 97.1 F (36.2 C)   Neurologic Exam Mental Status: Awake and fully alert. Oriented to place and time. Recent and remote memory intact. Attention span, concentration and fund of knowledge appropriate. Mood and affect appropriate.  Cranial Nerves: Fundoscopic exam reveals sharp disc margins. Pupils equal, briskly reactive to light. Extraocular movements full without nystagmus. Visual fields full to confrontation. Hearing intact. Facial sensation intact. Face, tongue, palate moves normally and symmetrically.  Motor: Normal bulk and tone. Normal strength in all tested extremity muscles. Sensory.: intact to touch and pinprick and vibratory sensation.  Coordination: Rapid alternating movements normal in all extremities. Finger-to-nose and heel-to-shin performed accurately bilaterally. Gait and Station: Arises from chair without difficulty. Stance is normal. Gait demonstrates normal stride length and balance . Able to heel, toe and tandem walk without difficulty.  Reflexes: 1+ and symmetric. Toes downgoing.   NIHSS  0 Modified Rankin  0   ASSESSMENT: 48 year lady with  multiple by his cerebral acute and subacute infarcts of embolic etiology without identified source in July 2015 with oldf left cerebellar stroke and multiple vascular risk factors of hyperlipidemia, smoking , hypertension, diabetes, obesity and cerebrovascular disease.    PLAN:  had a long discussion with the patient and her husband regarding her recent bicerebral as well as prior silent strokes with strong suspicion for cardiogenic embolism. I personally reviewed imaging studies, hospital stroke evaluation, risk for recurrent strokes and answered questions. Recommend  We check TEE and outpatient  cardiac event monitor for paroxysmal atrial fibrillation. Continue Plavix for stroke prevention and strict control of diabetes with hemoglobin A1c goal below 6.5, lipids with LDL cholesterol goal below 100 mg percent and hypertension with blood pressure goes below 130/90. Also advised patient to diet, exercise and lose weight. She was also strongly counseled to quit smoking but she is adamant and reveals refuses to do so. Return for followup in 3 months with Dr. Erlinda Hong or call earlier if necessary    Note: This document was prepared with digital dictation and possible smart phrase technology. Any transcriptional errors that result from this process are unintentional

## 2013-08-28 NOTE — Addendum Note (Signed)
Addended by: Lolita Cram T on: 08/28/2013 09:23 AM   Modules accepted: Orders

## 2013-08-31 ENCOUNTER — Other Ambulatory Visit (HOSPITAL_COMMUNITY): Payer: No Typology Code available for payment source

## 2013-09-07 ENCOUNTER — Telehealth: Payer: Self-pay | Admitting: Neurology

## 2013-09-07 ENCOUNTER — Telehealth: Payer: Self-pay | Admitting: *Deleted

## 2013-09-07 ENCOUNTER — Telehealth: Payer: Self-pay

## 2013-09-07 NOTE — Telephone Encounter (Signed)
New message     Need to schedule a TEE on patient---order in system by Dr Leonie Man.

## 2013-09-07 NOTE — Telephone Encounter (Signed)
Lorriane Shire calling re: order for TEE on this pt.   Ordered at [facility]TE.  Will cancel for tomorrow, but will go ahead with cardiac event monitor that was scheduled at the same time.  TEE transesophageal echo, is scheduled at the hospital thru [facility].  [phone].

## 2013-09-07 NOTE — Telephone Encounter (Signed)
Left message for sonya to call. ? How soon needed.

## 2013-09-07 NOTE — Telephone Encounter (Signed)
Cardiac Cath lab called and stated patient cancelled Cardiac Monitor due to not feeling well.  Will call back at a later time to rs.

## 2013-09-07 NOTE — Telephone Encounter (Signed)
Spoke with Butch Penny @ [facility]. They will get patient scheduled.

## 2013-09-07 NOTE — Telephone Encounter (Signed)
noted 

## 2013-09-07 NOTE — Telephone Encounter (Signed)
[name] from [facility] calling to ask how soon patient needs TEE scheduled, please return call and advise.

## 2013-09-08 ENCOUNTER — Other Ambulatory Visit (HOSPITAL_COMMUNITY): Payer: No Typology Code available for payment source

## 2013-09-09 NOTE — Telephone Encounter (Signed)
I placed call to pt to schedule TEE. Left message to call back

## 2013-09-18 NOTE — Telephone Encounter (Signed)
Left message on pt's identified voicemail to call our office to schedule procedure.

## 2013-09-22 NOTE — Telephone Encounter (Signed)
Left message for patient to call back to schedule TEE.

## 2013-11-04 ENCOUNTER — Other Ambulatory Visit (HOSPITAL_COMMUNITY): Payer: Self-pay | Admitting: Internal Medicine

## 2013-11-26 ENCOUNTER — Ambulatory Visit: Payer: BC Managed Care – PPO | Admitting: Neurology

## 2013-12-04 ENCOUNTER — Encounter (HOSPITAL_BASED_OUTPATIENT_CLINIC_OR_DEPARTMENT_OTHER): Payer: Self-pay

## 2013-12-04 ENCOUNTER — Emergency Department (HOSPITAL_BASED_OUTPATIENT_CLINIC_OR_DEPARTMENT_OTHER)
Admission: EM | Admit: 2013-12-04 | Discharge: 2013-12-04 | Discharge status: Against Medical Advice | Payer: BC Managed Care – PPO | Attending: Emergency Medicine | Admitting: Emergency Medicine

## 2013-12-04 DIAGNOSIS — M545 Low back pain: Secondary | ICD-10-CM | POA: Diagnosis not present

## 2013-12-04 DIAGNOSIS — E119 Type 2 diabetes mellitus without complications: Secondary | ICD-10-CM | POA: Insufficient documentation

## 2013-12-04 DIAGNOSIS — I10 Essential (primary) hypertension: Secondary | ICD-10-CM | POA: Diagnosis not present

## 2013-12-04 DIAGNOSIS — Z720 Tobacco use: Secondary | ICD-10-CM | POA: Diagnosis not present

## 2013-12-04 NOTE — ED Notes (Signed)
Pt states she was bent down was unable to stand-son helped lift pt up now having lower back pain-slow steady gait into traige

## 2013-12-21 ENCOUNTER — Other Ambulatory Visit: Payer: Self-pay | Admitting: Endocrinology

## 2013-12-21 DIAGNOSIS — D3500 Benign neoplasm of unspecified adrenal gland: Secondary | ICD-10-CM

## 2013-12-29 ENCOUNTER — Inpatient Hospital Stay: Admission: RE | Admit: 2013-12-29 | Payer: No Typology Code available for payment source | Source: Ambulatory Visit

## 2014-02-03 ENCOUNTER — Ambulatory Visit
Admission: RE | Admit: 2014-02-03 | Discharge: 2014-02-03 | Disposition: A | Discharge status: Home / Self Care | Payer: BLUE CROSS/BLUE SHIELD | Source: Ambulatory Visit | Attending: Endocrinology | Admitting: Endocrinology

## 2014-02-03 DIAGNOSIS — D3500 Benign neoplasm of unspecified adrenal gland: Secondary | ICD-10-CM

## 2014-02-25 ENCOUNTER — Telehealth: Payer: Self-pay | Admitting: Cardiology

## 2014-02-25 NOTE — Telephone Encounter (Signed)
Received records from [facility] for appointment on 03/05/14 with Dr Percival Spanish.  Records given to [facility] (medical records) for Dr Hochrein's schedule on 03/05/14. lp

## 2014-02-26 ENCOUNTER — Emergency Department (HOSPITAL_COMMUNITY): Payer: BLUE CROSS/BLUE SHIELD

## 2014-02-26 ENCOUNTER — Emergency Department (HOSPITAL_COMMUNITY)
Admission: EM | Admit: 2014-02-26 | Discharge: 2014-02-26 | Disposition: A | Discharge status: Home / Self Care | Payer: BLUE CROSS/BLUE SHIELD | Attending: Emergency Medicine | Admitting: Emergency Medicine

## 2014-02-26 ENCOUNTER — Encounter (HOSPITAL_COMMUNITY): Payer: Self-pay | Admitting: Emergency Medicine

## 2014-02-26 DIAGNOSIS — Z880 Allergy status to penicillin: Secondary | ICD-10-CM | POA: Diagnosis not present

## 2014-02-26 DIAGNOSIS — R112 Nausea with vomiting, unspecified: Secondary | ICD-10-CM | POA: Diagnosis not present

## 2014-02-26 DIAGNOSIS — Z86711 Personal history of pulmonary embolism: Secondary | ICD-10-CM | POA: Diagnosis not present

## 2014-02-26 DIAGNOSIS — Z720 Tobacco use: Secondary | ICD-10-CM | POA: Insufficient documentation

## 2014-02-26 DIAGNOSIS — R1011 Right upper quadrant pain: Secondary | ICD-10-CM | POA: Insufficient documentation

## 2014-02-26 DIAGNOSIS — Z7902 Long term (current) use of antithrombotics/antiplatelets: Secondary | ICD-10-CM | POA: Diagnosis not present

## 2014-02-26 DIAGNOSIS — Z3202 Encounter for pregnancy test, result negative: Secondary | ICD-10-CM | POA: Diagnosis not present

## 2014-02-26 DIAGNOSIS — F329 Major depressive disorder, single episode, unspecified: Secondary | ICD-10-CM | POA: Diagnosis not present

## 2014-02-26 DIAGNOSIS — Z8673 Personal history of transient ischemic attack (TIA), and cerebral infarction without residual deficits: Secondary | ICD-10-CM | POA: Insufficient documentation

## 2014-02-26 DIAGNOSIS — R1031 Right lower quadrant pain: Secondary | ICD-10-CM | POA: Diagnosis not present

## 2014-02-26 DIAGNOSIS — Z86018 Personal history of other benign neoplasm: Secondary | ICD-10-CM | POA: Insufficient documentation

## 2014-02-26 DIAGNOSIS — Z79899 Other long term (current) drug therapy: Secondary | ICD-10-CM | POA: Insufficient documentation

## 2014-02-26 DIAGNOSIS — E119 Type 2 diabetes mellitus without complications: Secondary | ICD-10-CM | POA: Diagnosis not present

## 2014-02-26 DIAGNOSIS — F419 Anxiety disorder, unspecified: Secondary | ICD-10-CM | POA: Diagnosis not present

## 2014-02-26 DIAGNOSIS — R109 Unspecified abdominal pain: Secondary | ICD-10-CM | POA: Diagnosis present

## 2014-02-26 DIAGNOSIS — I10 Essential (primary) hypertension: Secondary | ICD-10-CM | POA: Insufficient documentation

## 2014-02-26 LAB — URINALYSIS, ROUTINE W REFLEX MICROSCOPIC
Bilirubin Urine: NEGATIVE
GLUCOSE, UA: 100 mg/dL — AB
Ketones, ur: NEGATIVE mg/dL
Nitrite: NEGATIVE
Protein, ur: NEGATIVE mg/dL
SPECIFIC GRAVITY, URINE: 1.006 (ref 1.005–1.030)
Urobilinogen, UA: 0.2 mg/dL (ref 0.0–1.0)
pH: 6 (ref 5.0–8.0)

## 2014-02-26 LAB — PREGNANCY, URINE: PREG TEST UR: NEGATIVE

## 2014-02-26 LAB — COMPREHENSIVE METABOLIC PANEL
ALT: 29 U/L (ref 0–35)
ANION GAP: 11 (ref 5–15)
AST: 25 U/L (ref 0–37)
Albumin: 4.5 g/dL (ref 3.5–5.2)
Alkaline Phosphatase: 44 U/L (ref 39–117)
BUN: 18 mg/dL (ref 6–23)
CALCIUM: 9.5 mg/dL (ref 8.4–10.5)
CO2: 25 mmol/L (ref 19–32)
Chloride: 99 mmol/L (ref 96–112)
Creatinine, Ser: 1.22 mg/dL — ABNORMAL HIGH (ref 0.50–1.10)
GFR calc non Af Amer: 51 mL/min — ABNORMAL LOW (ref >90)
GFR, EST AFRICAN AMERICAN: 59 mL/min — AB (ref >90)
GLUCOSE: 164 mg/dL — AB (ref 70–99)
Potassium: 3.6 mmol/L (ref 3.5–5.1)
SODIUM: 135 mmol/L (ref 135–145)
Total Bilirubin: 0.4 mg/dL (ref 0.3–1.2)
Total Protein: 7.7 g/dL (ref 6.0–8.3)

## 2014-02-26 LAB — CBC WITH DIFFERENTIAL/PLATELET
BASOS ABS: 0 10*3/uL (ref 0.0–0.1)
BASOS PCT: 0 % (ref 0–1)
EOS PCT: 1 % (ref 0–5)
Eosinophils Absolute: 0.2 10*3/uL (ref 0.0–0.7)
HCT: 47.8 % — ABNORMAL HIGH (ref 36.0–46.0)
Hemoglobin: 16.3 g/dL — ABNORMAL HIGH (ref 12.0–15.0)
Lymphocytes Relative: 28 % (ref 12–46)
Lymphs Abs: 3.8 10*3/uL (ref 0.7–4.0)
MCH: 32.6 pg (ref 26.0–34.0)
MCHC: 34.1 g/dL (ref 30.0–36.0)
MCV: 95.6 fL (ref 78.0–100.0)
Monocytes Absolute: 0.8 10*3/uL (ref 0.1–1.0)
Monocytes Relative: 6 % (ref 3–12)
NEUTROS ABS: 8.9 10*3/uL — AB (ref 1.7–7.7)
Neutrophils Relative %: 65 % (ref 43–77)
PLATELETS: 330 10*3/uL (ref 150–400)
RBC: 5 MIL/uL (ref 3.87–5.11)
RDW: 14.1 % (ref 11.5–15.5)
WBC: 13.7 10*3/uL — ABNORMAL HIGH (ref 4.0–10.5)

## 2014-02-26 LAB — URINE MICROSCOPIC-ADD ON

## 2014-02-26 LAB — LIPASE, BLOOD: Lipase: 28 U/L (ref 11–59)

## 2014-02-26 MED ORDER — HYDROMORPHONE HCL 1 MG/ML IJ SOLN
1.0000 mg | Freq: Once | INTRAMUSCULAR | Status: AC
  Administered 2014-02-26: 1 mg via INTRAVENOUS
  Filled 2014-02-26: qty 1

## 2014-02-26 MED ORDER — METOCLOPRAMIDE HCL 5 MG/ML IJ SOLN
10.0000 mg | Freq: Once | INTRAMUSCULAR | Status: AC
  Administered 2014-02-26: 10 mg via INTRAVENOUS
  Filled 2014-02-26: qty 2

## 2014-02-26 MED ORDER — SODIUM CHLORIDE 0.9 % IV SOLN
1000.0000 mL | INTRAVENOUS | Status: DC
  Administered 2014-02-26: 1000 mL via INTRAVENOUS

## 2014-02-26 MED ORDER — IOHEXOL 300 MG/ML  SOLN
50.0000 mL | Freq: Once | INTRAMUSCULAR | Status: AC | PRN
Start: 2014-02-26 — End: 2014-02-26
  Administered 2014-02-26: 50 mL via ORAL

## 2014-02-26 MED ORDER — HYDROCODONE-ACETAMINOPHEN 5-325 MG PO TABS: 1.0000 | ORAL_TABLET | ORAL | Status: DC | PRN

## 2014-02-26 MED ORDER — ONDANSETRON 8 MG PO TBDP: 8.0000 mg | ORAL_TABLET | Freq: Three times a day (TID) | ORAL | Status: DC | PRN

## 2014-02-26 MED ORDER — ONDANSETRON HCL 4 MG/2ML IJ SOLN
4.0000 mg | Freq: Once | INTRAMUSCULAR | Status: AC
  Administered 2014-02-26: 4 mg via INTRAVENOUS
  Filled 2014-02-26: qty 2

## 2014-02-26 MED ORDER — IOHEXOL 300 MG/ML  SOLN
100.0000 mL | Freq: Once | INTRAMUSCULAR | Status: AC | PRN
  Administered 2014-02-26: 100 mL via INTRAVENOUS

## 2014-02-26 MED ORDER — SODIUM CHLORIDE 0.9 % IV SOLN
1000.0000 mL | Freq: Once | INTRAVENOUS | Status: AC
  Administered 2014-02-26: 1000 mL via INTRAVENOUS

## 2014-02-26 NOTE — ED Provider Notes (Signed)
CSN: [id number]     Arrival date & time 02/26/14  4627 History   First MD Initiated Contact with Patient 02/26/14 0930     Chief Complaint  Patient presents with  . Abdominal Pain  . Nausea  . Emesis    HPI Patient reports right-sided abdominal pain over the past 6 days which is been constant.  She reports that she develop nausea and vomiting this morning.  She denies diarrhea.  She states no change in her pain.  She's never had abdominal surgery.  She still has her gallbladder and her appendix.  She continues to have menstrual cycles.  She has no urinary complaints.  No vaginal complaints.  Denies back pain or flank pain.  Denies chest pain shortness of breath.  Her pain is moderate in severity.   Past Medical History  Diagnosis Date  . PE (pulmonary embolism)   . Anxiety   . Diabetes mellitus without complication   . Acid reflux   . Depression   . Adrenal benign tumor     Apparently enlarging and patient "is supposed to have it taken out"  . Hypertension   . Stroke    Past Surgical History  Procedure Laterality Date  . No past surgeries     Family History  Problem Relation Age of Onset  . Heart attack Father    History  Substance Use Topics  . Smoking status: Current Every Day Smoker -- 1.00 packs/day for 40 years    Types: Cigarettes  . Smokeless tobacco: Never Used  . Alcohol Use: No   OB History    No data available     Review of Systems  All other systems reviewed and are negative.     Allergies  Amoxicillin; Aspirin; Codeine; and Penicillins  Home Medications   Prior to Admission medications   Medication Sig Start Date End Date Taking? Authorizing Provider  ALPRAZolam [name]) 1 MG tablet Take 1 mg by mouth 2 (two) times daily as needed for anxiety.    Historical Provider, MD  clopidogrel (PLAVIX) 75 MG tablet Take 1 tablet (75 mg total) by mouth daily. 07/31/13   [name], MD  fenofibrate 160 MG tablet Take 1 tablet (160 mg total) by mouth daily.  07/31/13   [name], MD  lisinopril (PRINIVIL,ZESTRIL) 20 MG tablet Take 20 mg by mouth daily.    Historical Provider, MD  metFORMIN (GLUCOPHAGE-XR) 500 MG 24 hr tablet Take 1 tablet (500 mg total) by mouth daily with breakfast. 10/24/12   Fransico Meadow, PA-C  PARoxetine (PAXIL) 40 MG tablet Take 40 mg by mouth every morning.    Historical Provider, MD   BP 144/77 mmHg  Pulse 130  Temp(Src) 97.8 F (36.6 C) (Oral)  Resp 19  SpO2 100%  LMP 02/23/2014 Physical Exam  Constitutional: She is oriented to person, place, and time. She appears well-developed and well-nourished. No distress.  HENT:  Head: Normocephalic and atraumatic.  Eyes: EOM are normal.  Neck: Normal range of motion.  Cardiovascular: Regular rhythm and normal heart sounds.   Tachycardia  Pulmonary/Chest: Effort normal and breath sounds normal.  Abdominal: Soft. She exhibits no distension.  Right-sided abdominal tenderness with both focal tenderness in her right upper quadrant as well as tenderness in her right lower quadrant.  No guarding or rebound  Musculoskeletal: Normal range of motion.  Neurological: She is alert and oriented to person, place, and time.  Skin: Skin is warm and dry.  Psychiatric: She has a normal  mood and affect. Judgment normal.  Nursing note and vitals reviewed.   ED Course  Procedures (including critical care time) Labs Review Labs Reviewed  CBC WITH DIFFERENTIAL/PLATELET - Abnormal; Notable for the following:    WBC 13.7 (*)    Hemoglobin 16.3 (*)    HCT 47.8 (*)    Neutro Abs 8.9 (*)    All other components within normal limits  COMPREHENSIVE METABOLIC PANEL - Abnormal; Notable for the following:    Glucose, Bld 164 (*)    Creatinine, Ser 1.22 (*)    GFR calc non Af Amer 51 (*)    GFR calc Af Amer 59 (*)    All other components within normal limits  URINALYSIS, ROUTINE W REFLEX MICROSCOPIC - Abnormal; Notable for the following:    APPearance CLOUDY (*)    Glucose, UA 100 (*)     Hgb urine dipstick MODERATE (*)    Leukocytes, UA SMALL (*)    All other components within normal limits  LIPASE, BLOOD  PREGNANCY, URINE  URINE MICROSCOPIC-ADD ON    Imaging Review Ct Abdomen Pelvis W Contrast  02/26/2014   CLINICAL DATA:  Right lower quadrant pain for 1 week  EXAM: CT ABDOMEN AND PELVIS WITH CONTRAST  TECHNIQUE: Multidetector CT imaging of the abdomen and pelvis was performed using the standard protocol following bolus administration of intravenous contrast.  CONTRAST:  74mL OMNIPAQUE IOHEXOL 300 MG/ML SOLN, 14mL OMNIPAQUE IOHEXOL 300 MG/ML SOLN  COMPARISON:  02/03/2014  FINDINGS: Lung bases are free of acute infiltrate or sizable effusion. Minimal scarring is noted in the right middle lobe medially.  The liver shows a few tiny hypodensities likely representing cysts but too small for adequate characterization. The gallbladder, spleen, right adrenal gland and pancreas are within normal limits. A stable low-density left adrenal mass is seen measuring approximately 4.6 cm. The overall appearance is stable from the prior exam kidneys are well visualized bilaterally. No renal calculi or obstructive changes are seen.  Aortoiliac calcifications are noted without aneurysmal dilatation. The appendix is well visualized. Ovarian cystic changes are seen bilaterally. A cystic structure is again noted posterior to the uterus and stable in appearance. This is of uncertain etiology but does not appear to be related to the fallopian tube. The bladder is well distended. No acute bony abnormality is noted.  IMPRESSION: Normal appearing appendix.  Stable left adrenal mass.  Stable ovarian cysts and cystic structure adjacent to the uterus.  No acute abnormality is noted.   Electronically Signed   By: Inez Catalina M.D.   On: 02/26/2014 12:09     EKG Interpretation None      MDM   Final diagnoses:  None    Given her discomfort about the right upper quadrant as well as the right lower quadrant  CT scan will be performed to evaluate as both appendicitis and cholecystitis are possibilities.  Nothing by mouth.  Labs pending.  Imaging pending.  Pain treated.  Nausea treated.  Tachycardia noted.  IV fluids infusing  CT scan without acute abnormalities.  Labs and urine are without significant abnormalities.  Patient feeling much better after pain control and IV fluids.  Vital signs normalizing.  Discharge home with primary care follow-up.  Home with a short course pain medicine and nausea medicine    Hoy Morn, MD 02/26/14 [phone]

## 2014-02-26 NOTE — ED Notes (Signed)
Pt c/o RLQ pain x 1 week but starting around 730 this morning pt has been n/v.  Pt vomited approx 6 times. Pt denies diarrhea.

## 2014-02-26 NOTE — ED Notes (Signed)
Patient transported to CT 

## 2014-02-26 NOTE — Discharge Instructions (Signed)

## 2014-03-05 ENCOUNTER — Encounter: Payer: No Typology Code available for payment source | Admitting: Cardiology

## 2014-03-07 NOTE — Progress Notes (Signed)
Error

## 2014-03-12 ENCOUNTER — Ambulatory Visit (INDEPENDENT_AMBULATORY_CARE_PROVIDER_SITE_OTHER): Payer: BLUE CROSS/BLUE SHIELD | Admitting: Cardiology

## 2014-03-12 ENCOUNTER — Encounter: Payer: Self-pay | Admitting: Cardiology

## 2014-03-12 VITALS — BP 90/50 | HR 106 | Ht 66.0 in | Wt 217.9 lb

## 2014-03-12 DIAGNOSIS — R0600 Dyspnea, unspecified: Secondary | ICD-10-CM

## 2014-03-12 NOTE — Patient Instructions (Signed)
We are ordering a TEE for you to get done at the hospital  We have ordered a stress test for you to get done  Your physician recommends that you schedule a follow-up appointment in: 6 months with Dr. Percival Spanish

## 2014-03-12 NOTE — Progress Notes (Signed)
Cardiology Office Note   Date:  03/12/2014   ID:  [name], DOB 1965/03/25, MRN [id number]  PCP:  Thressa Sheller, MD  Cardiologist:   Minus Breeding, MD   Chief Complaint  Patient presents with  . Surgical Clearance      History of Present Illness: [name] is a 49 y.o. female who presents for  preoperative evaluation prior to getting an adrenal mass removed. This is enlarging. She was last hospitalized in July of last year small acute infarcts parietal, occipital and left cerebellar. Hypercoagulable panel was normal. She was treated with Plavix. 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 I did review Dr. Clydene Fake note and she was to have a work up to include a TEE and event monitor.  However, she says that she sleeps on her stomach at night and does not want an event monitor. She is a nice lady who has not had any prior cardiac workup other than transthoracic echo which was essentially unremarkable. She does not feel any palpitations, presyncope or syncope. She does not have chest pressure, neck or arm discomfort. She does not have weight gain or edema. She is not active.  With activity such as walking 3-4 however she denies any acute cardiovascular symptoms.  She's not having any chest pain, neck discomfort or arm discomfort. She does occasionally feel some palpitations lasting for a few minutes. She denies any PND or orthopnea. She's had no weight gain or edema.    Past Medical History  Diagnosis Date  . PE (pulmonary embolism)   . Anxiety   . Diabetes mellitus without complication   . Acid reflux   . Depression   . Adrenal benign tumor     Apparently enlarging and patient "is supposed to have it taken out"  . Hypertension   . Stroke     Past Surgical History  Procedure Laterality Date  . No past surgeries       Current Outpatient Prescriptions  Medication Sig Dispense Refill  . ALPRAZolam (XANAX) 1 MG  tablet Take 1 mg by mouth 2 (two) times daily as needed for anxiety.    . clopidogrel (PLAVIX) 75 MG tablet Take 1 tablet (75 mg total) by mouth daily. 30 tablet 2  . cyclobenzaprine (FLEXERIL) 10 MG tablet Take 1 tablet by mouth at bedtime as needed. For muscle spasms  0  . fenofibrate 160 MG tablet Take 1 tablet (160 mg total) by mouth daily. 30 tablet 2  . HYDROcodone-acetaminophen (NORCO/VICODIN) 5-325 MG per tablet Take 1 tablet by mouth every 4 (four) hours as needed for moderate pain. 12 tablet 0  . lisinopril (PRINIVIL,ZESTRIL) 20 MG tablet Take 20 mg by mouth daily.    . metFORMIN (GLUCOPHAGE-XR) 500 MG 24 hr tablet Take 1 tablet (500 mg total) by mouth daily with breakfast. (Patient taking differently: Take 2,000 mg by mouth every evening. ) 30 tablet 0  . ondansetron (ZOFRAN ODT) 8 MG disintegrating tablet Take 1 tablet (8 mg total) by mouth every 8 (eight) hours as needed for nausea or vomiting. 10 tablet 0  . ondansetron (ZOFRAN) 4 MG/5ML solution Take 5 mLs by mouth every 6 (six) hours as needed. Nausea and vomitting  5  . PARoxetine (PAXIL) 40 MG tablet Take 40 mg by mouth every morning.    . pravastatin (PRAVACHOL) 10 MG tablet Take 1 tablet by mouth daily.  1   No current facility-administered medications for this visit.  Allergies:   Amoxicillin; Aspirin; Codeine; and Penicillins    Social History:  The patient  reports that she has been smoking Cigarettes.  She has a 40 pack-year smoking history. She has never used smokeless tobacco. She reports that she does not drink alcohol or use illicit drugs.   Family History:  The patient's family history includes Heart attack (age of onset: 84) in her father.    ROS:  Please see the history of present illness.   Otherwise, review of systems are positive for positive for headaches in the back of her head, acid reflux..   All other systems are reviewed and negative.    PHYSICAL EXAM: VS:  BP 90/50 mmHg  Pulse 106  Ht 5\' 6"   (1.676 m)  Wt 217 lb 14.4 oz (98.839 kg)  BMI 35.19 kg/m2  LMP 02/23/2014 , BMI Body mass index is 35.19 kg/(m^2). GENERAL:  Well appearing HEENT:  Pupils equal round and reactive, fundi not visualized, oral mucosa unremarkable NECK:  No jugular venous distention, waveform within normal limits, carotid upstroke brisk and symmetric, no bruits, no thyromegaly LYMPHATICS:  No cervical, inguinal adenopathy LUNGS:  Clear to auscultation bilaterally BACK:  No CVA tenderness CHEST:  Unremarkable HEART:  PMI not displaced or sustained,S1 and S2 within normal limits, no S3, no S4, no clicks, no rubs, no murmurs ABD:  Flat, positive bowel sounds normal in frequency in pitch, no bruits, no rebound, no guarding, no midline pulsatile mass, no hepatomegaly, no splenomegaly EXT:  2 plus pulses throughout, no edema, no cyanosis no clubbing SKIN:  No rashes no nodules NEURO:  Cranial nerves II through XII grossly intact, motor grossly intact throughout PSYCH:  Cognitively intact, oriented to person place and time    EKG:  EKG is ordered today. The ekg ordered today demonstrates sinus tachycardia, rate 106, axis within normal limits, intervals within normal limits, no acute ST-T wave changes.   Recent Labs: 07/31/2013: TSH 0.941 02/26/2014: ALT 29; BUN 18; Creatinine 1.22*; Hemoglobin 16.3*; Platelets 330; Potassium 3.6; Sodium 135    Lipid Panel    Component Value Date/Time   CHOL 234* 07/31/2013 0420   TRIG 401* 07/31/2013 0420   HDL 30* 07/31/2013 0420   CHOLHDL 7.8 07/31/2013 0420   VLDL UNABLE TO CALCULATE IF TRIGLYCERIDE OVER 400 mg/dL 07/31/2013 0420   LDLCALC UNABLE TO CALCULATE IF TRIGLYCERIDE OVER 400 mg/dL 07/31/2013 0420      Wt Readings from Last 3 Encounters:  03/12/14 217 lb 14.4 oz (98.839 kg)  12/04/13 231 lb (104.781 kg)  08/26/13 231 lb (104.781 kg)      Other studies Reviewed: Additional studies/ records that were reviewed today include: Outside office records and  hospital notes.  TTE results reviewed. Review of the above records demonstrates:  Please see elsewhere in the note.     ASSESSMENT AND PLAN:  PREOP:  The patient does have significant cardiovascular risk factors. I would like to screen her with a POET (Plain Old Exercise Treadmill)  CVA:  The patient has apparently had some difficulty in scheduling a TEE and I will arrange this. She has not wanted to wear an external about monitor. I began excluding dysrhythmia is important and I will look into implanting a LINQ through our office.  For now she will remain on Plavix as listed.   Current medicines are reviewed at length with the patient today.  The patient does not have concerns regarding medicines.  The following changes have been made:  no change  Labs/ tests ordered today include: TEE, POET (Plain Old Exercise Treadmill)  Orders Placed This Encounter  Procedures  . EKG 12-Lead  . Exercise tolerance test  . Echocardiogram transesophageal     Disposition:   FU with me after these studies.      Signed, Minus Breeding, MD  03/12/2014 1:34 PM    [facility]

## 2014-03-17 ENCOUNTER — Other Ambulatory Visit: Payer: Self-pay | Admitting: *Deleted

## 2014-03-17 DIAGNOSIS — R0600 Dyspnea, unspecified: Secondary | ICD-10-CM

## 2014-03-18 ENCOUNTER — Other Ambulatory Visit: Payer: Self-pay | Admitting: *Deleted

## 2014-03-18 ENCOUNTER — Telehealth: Payer: Self-pay | Admitting: *Deleted

## 2014-03-18 DIAGNOSIS — R0600 Dyspnea, unspecified: Secondary | ICD-10-CM

## 2014-03-18 NOTE — Telephone Encounter (Signed)
Pt. Called and was unavailable so I passed the testing information to her husband who stated he understood the information given TEE and Echo with Bubble study to be done by Dr. Debara Pickett at 10:00 pt to be there at 8:45

## 2014-03-25 ENCOUNTER — Encounter (HOSPITAL_COMMUNITY)
Admission: RE | Disposition: A | Discharge status: Home / Self Care | Payer: Self-pay | Source: Ambulatory Visit | Attending: Internal Medicine

## 2014-03-25 ENCOUNTER — Ambulatory Visit (HOSPITAL_COMMUNITY)
Admission: RE | Admit: 2014-03-25 | Discharge: 2014-03-25 | Disposition: A | Discharge status: Home / Self Care | Payer: BLUE CROSS/BLUE SHIELD | Source: Ambulatory Visit | Attending: Internal Medicine | Admitting: Internal Medicine

## 2014-03-25 ENCOUNTER — Encounter (HOSPITAL_COMMUNITY): Payer: Self-pay | Admitting: Internal Medicine

## 2014-03-25 DIAGNOSIS — E119 Type 2 diabetes mellitus without complications: Secondary | ICD-10-CM | POA: Insufficient documentation

## 2014-03-25 DIAGNOSIS — Z886 Allergy status to analgesic agent status: Secondary | ICD-10-CM | POA: Diagnosis not present

## 2014-03-25 DIAGNOSIS — F419 Anxiety disorder, unspecified: Secondary | ICD-10-CM | POA: Diagnosis not present

## 2014-03-25 DIAGNOSIS — I639 Cerebral infarction, unspecified: Secondary | ICD-10-CM | POA: Diagnosis not present

## 2014-03-25 DIAGNOSIS — Z8673 Personal history of transient ischemic attack (TIA), and cerebral infarction without residual deficits: Secondary | ICD-10-CM | POA: Insufficient documentation

## 2014-03-25 DIAGNOSIS — F1590 Other stimulant use, unspecified, uncomplicated: Secondary | ICD-10-CM | POA: Insufficient documentation

## 2014-03-25 DIAGNOSIS — K219 Gastro-esophageal reflux disease without esophagitis: Secondary | ICD-10-CM | POA: Insufficient documentation

## 2014-03-25 DIAGNOSIS — I10 Essential (primary) hypertension: Secondary | ICD-10-CM | POA: Insufficient documentation

## 2014-03-25 DIAGNOSIS — I63539 Cerebral infarction due to unspecified occlusion or stenosis of unspecified posterior cerebral artery: Secondary | ICD-10-CM | POA: Diagnosis present

## 2014-03-25 DIAGNOSIS — G459 Transient cerebral ischemic attack, unspecified: Secondary | ICD-10-CM | POA: Diagnosis present

## 2014-03-25 DIAGNOSIS — I340 Nonrheumatic mitral (valve) insufficiency: Secondary | ICD-10-CM | POA: Insufficient documentation

## 2014-03-25 DIAGNOSIS — F329 Major depressive disorder, single episode, unspecified: Secondary | ICD-10-CM | POA: Insufficient documentation

## 2014-03-25 DIAGNOSIS — Z86718 Personal history of other venous thrombosis and embolism: Secondary | ICD-10-CM | POA: Diagnosis not present

## 2014-03-25 DIAGNOSIS — Z880 Allergy status to penicillin: Secondary | ICD-10-CM | POA: Insufficient documentation

## 2014-03-25 DIAGNOSIS — F17210 Nicotine dependence, cigarettes, uncomplicated: Secondary | ICD-10-CM | POA: Diagnosis not present

## 2014-03-25 DIAGNOSIS — Z881 Allergy status to other antibiotic agents status: Secondary | ICD-10-CM | POA: Insufficient documentation

## 2014-03-25 DIAGNOSIS — Z885 Allergy status to narcotic agent status: Secondary | ICD-10-CM | POA: Diagnosis not present

## 2014-03-25 DIAGNOSIS — Z86711 Personal history of pulmonary embolism: Secondary | ICD-10-CM | POA: Insufficient documentation

## 2014-03-25 DIAGNOSIS — I071 Rheumatic tricuspid insufficiency: Secondary | ICD-10-CM | POA: Insufficient documentation

## 2014-03-25 HISTORY — PX: TEE WITHOUT CARDIOVERSION: SHX5443

## 2014-03-25 HISTORY — PX: LOOP RECORDER IMPLANT: SHX5477

## 2014-03-25 SURGERY — LOOP RECORDER IMPLANT
Anesthesia: LOCAL

## 2014-03-25 SURGERY — ECHOCARDIOGRAM, TRANSESOPHAGEAL
Anesthesia: Moderate Sedation

## 2014-03-25 MED ORDER — DIPHENHYDRAMINE HCL 50 MG/ML IJ SOLN
INTRAMUSCULAR | Status: AC
  Filled 2014-03-25: qty 1

## 2014-03-25 MED ORDER — DIPHENHYDRAMINE HCL 50 MG/ML IJ SOLN
INTRAMUSCULAR | Status: DC | PRN
  Administered 2014-03-25: 25 mg via INTRAVENOUS

## 2014-03-25 MED ORDER — LIDOCAINE-EPINEPHRINE 1 %-1:100000 IJ SOLN
INTRAMUSCULAR | Status: AC
  Filled 2014-03-25: qty 1

## 2014-03-25 MED ORDER — SODIUM CHLORIDE 0.9 % IV SOLN
INTRAVENOUS | Status: DC
  Administered 2014-03-25: 500 mL via INTRAVENOUS

## 2014-03-25 MED ORDER — LIDOCAINE VISCOUS 2 % MT SOLN
OROMUCOSAL | Status: AC
  Filled 2014-03-25: qty 15

## 2014-03-25 MED ORDER — FENTANYL CITRATE 0.05 MG/ML IJ SOLN
INTRAMUSCULAR | Status: AC
  Filled 2014-03-25: qty 2

## 2014-03-25 MED ORDER — MIDAZOLAM HCL 10 MG/2ML IJ SOLN
INTRAMUSCULAR | Status: DC | PRN
  Administered 2014-03-25 (×3): 2 mg via INTRAVENOUS
  Administered 2014-03-25: 3 mg via INTRAVENOUS

## 2014-03-25 MED ORDER — MIDAZOLAM HCL 5 MG/ML IJ SOLN
INTRAMUSCULAR | Status: AC
  Filled 2014-03-25: qty 2

## 2014-03-25 MED ORDER — FENTANYL CITRATE 0.05 MG/ML IJ SOLN
INTRAMUSCULAR | Status: DC | PRN
Start: 2014-03-25 — End: 2014-03-25
  Administered 2014-03-25 (×4): 25 ug via INTRAVENOUS

## 2014-03-25 MED ORDER — BUTAMBEN-TETRACAINE-BENZOCAINE 2-2-14 % EX AERO
INHALATION_SPRAY | CUTANEOUS | Status: DC | PRN
  Administered 2014-03-25: 2 via TOPICAL

## 2014-03-25 MED ORDER — LIDOCAINE VISCOUS 2 % MT SOLN
OROMUCOSAL | Status: DC | PRN
  Administered 2014-03-25: 1 via OROMUCOSAL

## 2014-03-25 NOTE — CV Procedure (Signed)
    TRANSESOPHAGEAL ECHOCARDIOGRAM (TEE) NOTE  INDICATIONS: stroke  PROCEDURE:   Informed consent was obtained prior to the procedure. The risks, benefits and alternatives for the procedure were discussed and the patient comprehended these risks.  Risks include, but are not limited to, cough, sore throat, vomiting, nausea, somnolence, esophageal and stomach trauma or perforation, bleeding, low blood pressure, aspiration, pneumonia, infection, trauma to the teeth and death.    After a procedural time-out, the patient was given 9 mg versed and 100 mcg fentanyl and 25 mg IV benadryl for moderate sedation.  The oropharynx was anesthetized 10 cc of topical 1% viscous lidocaine and 2 cetacaine sprays.  The transesophageal probe was inserted in the esophagus and stomach without difficulty and multiple views were obtained.  The patient was kept under observation until the patient left the procedure room.  The patient left the procedure room in stable condition.   Agitated microbubble saline contrast was administered.  COMPLICATIONS:    There were no immediate complications.  Findings:  1. LEFT VENTRICLE: The left ventricular wall thickness is normal.  The left ventricular cavity is normal in size. Wall motion is normal.  LVEF is 55-60%.  2. RIGHT VENTRICLE:  The right ventricle is normal in structure and function without any thrombus or masses.    3. LEFT ATRIUM:  The left atrium is normal in size without any thrombus or masses.  There is not spontaneous echo contrast ("smoke") in the left atrium consistent with a low flow state.  4. LEFT ATRIAL APPENDAGE:  The left atrial appendage is free of any thrombus or masses. The appendage has single lobes. Pulse doppler indicates moderate flow in the appendage.  5. ATRIAL SEPTUM:  The atrial septum appears intact and is free of thrombus and/or masses.  There is no evidence for interatrial shunting by color doppler and saline microbubble.  6. RIGHT  ATRIUM:  The right atrium is normal in size and function without any thrombus or masses.  7. MITRAL VALVE:  The mitral valve is normal in structure and function with Mild regurgitation and 2 distinct eccentric regurgitant jets.  There were no vegetations or stenosis.  8. AORTIC VALVE:  The aortic valve is trileaflet, normal in structure and function with no regurgitation.  There were no vegetations or stenosis  9. TRICUSPID VALVE:  The tricuspid valve is normal in structure and function with trivial regurgitation.  There were no vegetations or stenosis  10.  PULMONIC VALVE:  The pulmonic valve is normal in structure and function with no regurgitation.  There were no vegetations or stenosis.   11. AORTIC ARCH, ASCENDING AND DESCENDING AORTA:  There was no Ron Parker et. Al, 1992) atherosclerosis of the ascending aorta, aortic arch, or proximal descending aorta.  12. PULMONARY VEINS: Anomalous pulmonary venous return was not noted.  13. PERICARDIUM: The pericardium appeared normal and non-thickened.  There is no pericardial effusion.  IMPRESSION:   1. No LAA thrombus 2. Negative for PFO or ASD 3. Normal LV function 4. Mild MR with eccentric jets  RECOMMENDATIONS:    1.  Proceed with cryptogenic stroke work-up, including loop recorder placement.  Time Spent Directly with the Patient:  45 minutes   [name], MD, [facility] Attending Cardiologist [facility]  03/25/2014, 11:29 AM

## 2014-03-25 NOTE — H&P (Signed)
     INTERVAL PROCEDURE H&P  History and Physical Interval Note:  03/25/2014 8:37 AM  [name] has presented today for their planned procedure. The various methods of treatment have been discussed with the patient and family. After consideration of risks, benefits and other options for treatment, the patient has consented to the procedure.  The patients' outpatient history has been reviewed, patient examined, and no change in status from most recent office note within the past 30 days. I have reviewed the patients' chart and labs and will proceed as planned. Questions were answered to the patient's satisfaction.   [name], MD, [facility] Attending Cardiologist [facility]  HILTY,Kenneth C 03/25/2014, 8:37 AM

## 2014-03-25 NOTE — Consult Note (Signed)
ELECTROPHYSIOLOGY CONSULT NOTE  Patient ID: [name] MRN: [id number], DOB/AGE: 08-23-1965   Admit date: 03/25/2014 Date of Consult: 03/25/2014  Primary Physician: Thressa Sheller, MD Primary Cardiologist: Dr. Percival Spanish  Reason for Consultation: Cryptogenic stroke; recommendations regarding Implantable Loop Recorder  History of Present Illness [name] is a 49 y/o female followed by Dr. Percival Spanish. She was last hospitalized in July of last year with small acute infarcts parietal, occipital and left cerebellar. Hypercoagulable panel was normal. She was treated with Plavix. 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 She was seen by Dr. Percival Spanish in clinic on 03/12/14. He reviewed Dr. Clydene Fake note and she was to have a work up to include a TEE and event monitor. However, the patient reported that she sleeps on her stomach at night and does not want an event monitor. Loop recorder was recommended by Dr. Percival Spanish.   She presented to [facility] today for TEE/ possible loop recorder. EP has been asked to evaluate for placement of an implantable loop recorder to monitor for atrial fibrillation.    Past Medical History Past Medical History  Diagnosis Date  . PE (pulmonary embolism)   . Anxiety   . Diabetes mellitus without complication   . Acid reflux   . Depression   . Adrenal benign tumor     Apparently enlarging and patient "is supposed to have it taken out"  . Hypertension   . Stroke     Past Surgical History Past Surgical History  Procedure Laterality Date  . No past surgeries      Allergies/Intolerances Allergies  Allergen Reactions  . Amoxicillin Hives  . Aspirin Hives  . Codeine Hives  . Penicillins Hives   Inpatient Medications   . sodium chloride 500 mL (03/25/14 0904)   Social History History   Social History  . Marital Status: Married    Spouse Name: Richard  . Number of Children: 2  . Years of Education: HS     Occupational History  .  Other    n/a   Social History Main Topics  . Smoking status: Current Every Day Smoker -- 1.00 packs/day for 40 years    Types: Cigarettes  . Smokeless tobacco: Never Used  . Alcohol Use: No  . Drug Use: No  . Sexual Activity: Not on file   Other Topics Concern  . Not on file   Social History Narrative   Patient lives at home with spouse.   Caffeine Use: 2 cups daily    Review of Systems General: No chills, fever, night sweats or weight changes  Cardiovascular:  No chest pain, dyspnea on exertion, edema, orthopnea, palpitations, paroxysmal nocturnal dyspnea Dermatological: No rash, lesions or masses Respiratory: No cough, dyspnea Urologic: No hematuria, dysuria Abdominal: No nausea, vomiting, diarrhea, bright red blood per rectum, melena, or hematemesis Neurologic: No visual changes, weakness, changes in mental status All other systems reviewed and are otherwise negative except as noted above.  Physical Exam Blood pressure 144/101, temperature 98.3 F (36.8 C), temperature source Oral, resp. rate 24, height 5\' 6"  (1.676 m), weight 217 lb (98.431 kg), last menstrual period 02/23/2014, SpO2 97 %.  General: Well developed, well appearing 49 y.o. female in no acute distress. HEENT: Normocephalic, atraumatic. EOMs intact. Sclera nonicteric. Oropharynx clear.  Neck: Supple without bruits. No JVD. Lungs: Respirations regular and unlabored, CTA bilaterally. No wheezes, rales or rhonchi. Heart: RRR. S1, S2 present. No murmurs, rub, S3 or S4. Abdomen: Soft,  non-tender, non-distended. BS present x 4 quadrants. No hepatosplenomegaly.  Extremities: No clubbing, cyanosis or edema. DP/PT/Radials 2+ and equal bilaterally. Psych: Normal affect. Neuro: Alert and oriented X 3. Moves all extremities spontaneously. Musculoskeletal: No kyphosis. Skin: Intact. Warm and dry. No rashes or petechiae in exposed areas.   Labs Lab Results  Component Value Date   WBC  13.7* 02/26/2014   HGB 16.3* 02/26/2014   HCT 47.8* 02/26/2014   MCV 95.6 02/26/2014   PLT 330 02/26/2014   No results for input(s): NA, K, CL, CO2, BUN, CREATININE, CALCIUM, PROT, BILITOT, ALKPHOS, ALT, AST, GLUCOSE in the last 168 hours.  Invalid input(s): LABALBU No results for input(s): INR in the last 72 hours.  Radiology/Studies Ct Abdomen Pelvis W Contrast  02/26/2014   CLINICAL DATA:  Right lower quadrant pain for 1 week  EXAM: CT ABDOMEN AND PELVIS WITH CONTRAST  TECHNIQUE: Multidetector CT imaging of the abdomen and pelvis was performed using the standard protocol following bolus administration of intravenous contrast.  CONTRAST:  1mL OMNIPAQUE IOHEXOL 300 MG/ML SOLN, 172mL OMNIPAQUE IOHEXOL 300 MG/ML SOLN  COMPARISON:  02/03/2014  FINDINGS: Lung bases are free of acute infiltrate or sizable effusion. Minimal scarring is noted in the right middle lobe medially.  The liver shows a few tiny hypodensities likely representing cysts but too small for adequate characterization. The gallbladder, spleen, right adrenal gland and pancreas are within normal limits. A stable low-density left adrenal mass is seen measuring approximately 4.6 cm. The overall appearance is stable from the prior exam kidneys are well visualized bilaterally. No renal calculi or obstructive changes are seen.  Aortoiliac calcifications are noted without aneurysmal dilatation. The appendix is well visualized. Ovarian cystic changes are seen bilaterally. A cystic structure is again noted posterior to the uterus and stable in appearance. This is of uncertain etiology but does not appear to be related to the fallopian tube. The bladder is well distended. No acute bony abnormality is noted.  IMPRESSION: Normal appearing appendix.  Stable left adrenal mass.  Stable ovarian cysts and cystic structure adjacent to the uterus.  No acute abnormality is noted.   Electronically Signed   By: Inez Catalina M.D.   On: 02/26/2014 12:09     Echocardiogram  TEE negative for clott  12-lead ECG  Sinus Tachycardiac HR 106 bpm Telemetry Sinus Tach   Assessment and Plan 1. Cryptogenic stroke   If the TEE is negative, we recommend loop recorder insertion to monitor for AF. The indication for loop recorder insertion / monitoring for AF in setting of cryptogenic stroke was discussed with the patient. The loop recorder insertion procedure was reviewed in detail including risks and benefits. These risks include but are not limited to bleeding and infection. The patient expressed verbal understanding and agrees to proceed. The patient was also counseled regarding wound care and device follow-up.  SignedLyda Jester 03/25/2014, 9:40 AM

## 2014-03-25 NOTE — Discharge Instructions (Signed)
Transesophageal Echocardiogram °Transesophageal echocardiography (TEE) is a special type of test that produces images of the heart by using sound waves (echocardiogram). This type of echocardiography can obtain better images of the heart than standard echocardiography. TEE is done by passing a flexible tube down the esophagus. The heart is located in front of the esophagus. Because the heart and esophagus are close to one another, your health care provider can take very clear, detailed pictures of the heart via ultrasound waves. °TEE may be done: °· If your health care provider needs more information based on standard echocardiography findings. °· If you had a stroke. This might have happened because a clot formed in your heart. TEE can visualize different areas of the heart and check for clots. °· To check valve anatomy and function. °· To check for infection on the inside of your heart (endocarditis). °· To evaluate the dividing wall (septum) of the heart and presence of a hole that did not close after birth (patent foramen ovale or atrial septal defect). °· To help diagnose a tear in the wall of the aorta (aortic dissection). °· During cardiac valve surgery. This allows the surgeon to assess the valve repair before closing the chest. °· During a variety of other cardiac procedures to guide positioning of catheters. °· Sometimes before a cardioversion, which is a shock to convert heart rhythm back to normal. °LET YOUR HEALTH CARE PROVIDER KNOW ABOUT:  °· Any allergies you have. °· All medicines you are taking, including vitamins, herbs, eye drops, creams, and over-the-counter medicines. °· Previous problems you or members of your family have had with the use of anesthetics. °· Any blood disorders you have. °· Previous surgeries you have had. °· Medical conditions you have. °· Swallowing difficulties. °· An esophageal obstruction. °RISKS AND COMPLICATIONS  °Generally, TEE is a safe procedure. However, as with any  procedure, complications can occur. Possible complications include an esophageal tear (rupture). °BEFORE THE PROCEDURE  °· Do not eat or drink for 6 hours before the procedure or as directed by your health care provider. °· Arrange for someone to drive you home after the procedure. Do not drive yourself home. During the procedure, you will be given medicines that can continue to make you feel drowsy and can impair your reflexes. °· An IV access tube will be started in the arm. °PROCEDURE  °· A medicine to help you relax (sedative) will be given through the IV access tube. °· A medicine may be sprayed or gargled to numb the back of the throat. °· Your blood pressure, heart rate, and breathing (vital signs) will be monitored during the procedure. °· The TEE probe is a long, flexible tube. The tip of the probe is placed into the back of the mouth, and you will be asked to swallow. This helps to pass the tip of the probe into the esophagus. Once the tip of the probe is in the correct area, your health care provider can take pictures of the heart. °· TEE is usually not a painful procedure. You may feel the probe press against the back of the throat. The probe does not enter the trachea and does not affect your breathing. °AFTER THE PROCEDURE  °· You will be in bed, resting, until you have fully returned to consciousness. °· When you first awaken, your throat may feel slightly sore and will probably still feel numb. This will improve slowly over time. °· You will not be allowed to eat or drink until it   is clear that the numbness has improved.  Once you have been able to drink, urinate, and sit on the edge of the bed without feeling sick to your stomach (nausea) or dizzy, you may be cleared to go home.  You should have a friend or family member with you for the next 24 hours after your procedure. Document Released: 03/17/2002 Document Revised: 12/30/2012 Document Reviewed: 06/26/2012 [facility] Patient Information  2015 [place], [place]. This information is not intended to replace advice given to you by your health care provider. Make sure you discuss any questions you have with your health care provider. Transesophageal Echocardiogram Transesophageal echocardiography (TEE) is a picture test of your heart using sound waves. The pictures taken can give very detailed pictures of your heart. This can help your doctor see if there are problems with your heart. TEE can check:  If your heart has blood clots in it.  How well your heart valves are working.  If you have an infection on the inside of your heart.  Some of the major arteries of your heart.  If your heart valve is working after a Office manager.  Your heart before a procedure that uses a shock to your heart to get the rhythm back to normal. BEFORE THE PROCEDURE  Do not eat or drink for 6 hours before the procedure or as told by your doctor.  Make plans to have someone drive you home after the procedure. Do not drive yourself home.  An IV tube will be put in your arm. PROCEDURE  You will be given a medicine to help you relax (sedative). It will be given through the IV tube.  A numbing medicine will be sprayed or gargled in the back of your throat to help numb it.  The tip of the probe is placed into the back of your mouth. You will be asked to swallow. This helps to pass the probe into your esophagus.  Once the tip of the probe is in the right place, your doctor can take pictures of your heart.  You may feel pressure at the back of your throat. AFTER THE PROCEDURE  You will be taken to a recovery area so the sedative can wear off.  Your throat may be sore and scratchy. This will go away slowly over time.  You will go home when you are fully awake and able to swallow liquids.  You should have someone stay with you for the next 24 hours.  Do not drive or operate machinery for the next 24 hours. Document Released: 10/22/2008 Document Revised:  12/30/2012 Document Reviewed: 06/26/2012 [facility] Patient Information 2015 [place], [place]. This information is not intended to replace advice given to you by your health care provider. Make sure you discuss any questions you have with your health care provider.

## 2014-03-25 NOTE — CV Procedure (Signed)
Pre op Dx Cryptogenic Stroke  Post op Dx Cryptogenic Stroke  Procedure  Loop Recorder implantation  After routine prep and drape of the left parasternal area, a small incision was created. A Med[employer]onic LINQ Reveal Loop Recorder  Serial Number  [id number] was inserted.    SteriStrip dressing was  applied.  The patient tolerated the procedure without apparent complication.

## 2014-03-25 NOTE — Progress Notes (Signed)
Echocardiogram Echocardiogram Transesophageal has been performed.  Dmarion Perfect 03/25/2014, 11:25 AM

## 2014-03-26 ENCOUNTER — Encounter (HOSPITAL_COMMUNITY): Payer: Self-pay | Admitting: Internal Medicine

## 2014-04-05 ENCOUNTER — Ambulatory Visit: Payer: No Typology Code available for payment source

## 2014-04-07 ENCOUNTER — Ambulatory Visit (INDEPENDENT_AMBULATORY_CARE_PROVIDER_SITE_OTHER): Payer: BLUE CROSS/BLUE SHIELD | Admitting: *Deleted

## 2014-04-07 DIAGNOSIS — I6350 Cerebral infarction due to unspecified occlusion or stenosis of unspecified cerebral artery: Secondary | ICD-10-CM

## 2014-04-07 DIAGNOSIS — I63539 Cerebral infarction due to unspecified occlusion or stenosis of unspecified posterior cerebral artery: Secondary | ICD-10-CM

## 2014-04-07 LAB — MDC_IDC_ENUM_SESS_TYPE_INCLINIC
MDC IDC SESS DTM: 20160330140123
MDC IDC SET ZONE DETECTION INTERVAL: 330 ms
Zone Setting Detection Interval: 2000 ms
Zone Setting Detection Interval: 3000 ms

## 2014-04-07 NOTE — Progress Notes (Signed)
ILR wound check. No episodes or symptom recordings. Steri-strips removed by pt prior to arrival. Wound well healed. [facility] summary reports QMO & ROV w/ Dr. Caryl Comes in 71mo.

## 2014-04-08 ENCOUNTER — Telehealth (HOSPITAL_COMMUNITY): Payer: Self-pay

## 2014-04-08 NOTE — Telephone Encounter (Signed)
Encounter complete. 

## 2014-04-09 ENCOUNTER — Encounter: Payer: Self-pay | Admitting: Internal Medicine

## 2014-04-13 ENCOUNTER — Ambulatory Visit (HOSPITAL_COMMUNITY)
Admission: RE | Admit: 2014-04-13 | Discharge: 2014-04-13 | Disposition: A | Discharge status: Home / Self Care | Payer: BLUE CROSS/BLUE SHIELD | Source: Ambulatory Visit | Attending: Cardiology | Admitting: Cardiology

## 2014-04-13 DIAGNOSIS — R0600 Dyspnea, unspecified: Secondary | ICD-10-CM | POA: Diagnosis not present

## 2014-04-13 NOTE — Procedures (Signed)
Exercise Treadmill Test Test  Exercise Tolerance Test Ordering MD: Marijo File, MD    Unique Test No: 1  Treadmill:  1  Indication for ETT: Palpitations  Contraindication to ETT: No   Stress Modality: exercise - treadmill  Cardiac Imaging Performed: non   Protocol: standard Bruce - maximal  Max BP:  166/83  Max MPHR (bpm):  170 85% MPR (bpm):  144  MPHR obtained (bpm):  151 % MPHR obtained:  88  Reached 85% MPHR (min:sec):  Total Exercise Time (min-sec):  1:42  Workload in METS:  4.1 Borg Scale: 16  Reason ETT Terminated:  SOB and Patient became fearful    ST Segment Analysis At Rest: Sinus tachycardia with no ST changes. With Exercise: no evidence of significant ST depression  Other Information Arrhythmia:  No Angina during ETT:  absent (0) Quality of ETT:  diagnostic  ETT Interpretation:  normal - no evidence of ischemia by ST analysis  Comments: ETT with poor exercise tolerance (1:42); normal BP response, no chest pain; no ST changes; Duke treadmill score 2; negative adequate ETT (patient achieved 88% PMHR)  Kirk Ruths

## 2014-04-23 ENCOUNTER — Ambulatory Visit (INDEPENDENT_AMBULATORY_CARE_PROVIDER_SITE_OTHER): Payer: BLUE CROSS/BLUE SHIELD | Admitting: *Deleted

## 2014-04-23 DIAGNOSIS — Z4509 Encounter for adjustment and management of other cardiac device: Secondary | ICD-10-CM | POA: Diagnosis not present

## 2014-04-23 DIAGNOSIS — I6350 Cerebral infarction due to unspecified occlusion or stenosis of unspecified cerebral artery: Secondary | ICD-10-CM

## 2014-04-23 DIAGNOSIS — I63539 Cerebral infarction due to unspecified occlusion or stenosis of unspecified posterior cerebral artery: Secondary | ICD-10-CM

## 2014-04-28 NOTE — Progress Notes (Signed)
Loop recorder 

## 2014-04-29 ENCOUNTER — Encounter (HOSPITAL_BASED_OUTPATIENT_CLINIC_OR_DEPARTMENT_OTHER): Payer: Self-pay

## 2014-04-29 ENCOUNTER — Emergency Department (HOSPITAL_BASED_OUTPATIENT_CLINIC_OR_DEPARTMENT_OTHER)
Admission: EM | Admit: 2014-04-29 | Discharge: 2014-04-29 | Disposition: A | Discharge status: Home / Self Care | Payer: BLUE CROSS/BLUE SHIELD | Attending: Emergency Medicine | Admitting: Emergency Medicine

## 2014-04-29 ENCOUNTER — Emergency Department (HOSPITAL_BASED_OUTPATIENT_CLINIC_OR_DEPARTMENT_OTHER): Payer: BLUE CROSS/BLUE SHIELD

## 2014-04-29 ENCOUNTER — Other Ambulatory Visit: Payer: Self-pay

## 2014-04-29 DIAGNOSIS — J209 Acute bronchitis, unspecified: Secondary | ICD-10-CM | POA: Insufficient documentation

## 2014-04-29 DIAGNOSIS — F419 Anxiety disorder, unspecified: Secondary | ICD-10-CM | POA: Diagnosis not present

## 2014-04-29 DIAGNOSIS — Z8719 Personal history of other diseases of the digestive system: Secondary | ICD-10-CM | POA: Diagnosis not present

## 2014-04-29 DIAGNOSIS — F329 Major depressive disorder, single episode, unspecified: Secondary | ICD-10-CM | POA: Diagnosis not present

## 2014-04-29 DIAGNOSIS — Z79899 Other long term (current) drug therapy: Secondary | ICD-10-CM | POA: Insufficient documentation

## 2014-04-29 DIAGNOSIS — E119 Type 2 diabetes mellitus without complications: Secondary | ICD-10-CM | POA: Diagnosis not present

## 2014-04-29 DIAGNOSIS — Z8673 Personal history of transient ischemic attack (TIA), and cerebral infarction without residual deficits: Secondary | ICD-10-CM | POA: Insufficient documentation

## 2014-04-29 DIAGNOSIS — Z720 Tobacco use: Secondary | ICD-10-CM | POA: Insufficient documentation

## 2014-04-29 DIAGNOSIS — Z880 Allergy status to penicillin: Secondary | ICD-10-CM | POA: Diagnosis not present

## 2014-04-29 DIAGNOSIS — J40 Bronchitis, not specified as acute or chronic: Secondary | ICD-10-CM

## 2014-04-29 DIAGNOSIS — I10 Essential (primary) hypertension: Secondary | ICD-10-CM | POA: Insufficient documentation

## 2014-04-29 DIAGNOSIS — Z86711 Personal history of pulmonary embolism: Secondary | ICD-10-CM | POA: Insufficient documentation

## 2014-04-29 DIAGNOSIS — R1110 Vomiting, unspecified: Secondary | ICD-10-CM | POA: Diagnosis present

## 2014-04-29 DIAGNOSIS — Z86018 Personal history of other benign neoplasm: Secondary | ICD-10-CM | POA: Diagnosis not present

## 2014-04-29 DIAGNOSIS — Z7902 Long term (current) use of antithrombotics/antiplatelets: Secondary | ICD-10-CM | POA: Diagnosis not present

## 2014-04-29 LAB — COMPREHENSIVE METABOLIC PANEL
ALBUMIN: 4 g/dL (ref 3.5–5.2)
ALT: 19 U/L (ref 0–35)
ANION GAP: 7 (ref 5–15)
AST: 16 U/L (ref 0–37)
Alkaline Phosphatase: 37 U/L — ABNORMAL LOW (ref 39–117)
BILIRUBIN TOTAL: 0.3 mg/dL (ref 0.3–1.2)
BUN: 16 mg/dL (ref 6–23)
CALCIUM: 8.7 mg/dL (ref 8.4–10.5)
CO2: 23 mmol/L (ref 19–32)
CREATININE: 1.29 mg/dL — AB (ref 0.50–1.10)
Chloride: 107 mmol/L (ref 96–112)
GFR calc Af Amer: 55 mL/min — ABNORMAL LOW (ref >90)
GFR, EST NON AFRICAN AMERICAN: 47 mL/min — AB (ref >90)
Glucose, Bld: 133 mg/dL — ABNORMAL HIGH (ref 70–99)
Potassium: 4.1 mmol/L (ref 3.5–5.1)
Sodium: 137 mmol/L (ref 135–145)
TOTAL PROTEIN: 7 g/dL (ref 6.0–8.3)

## 2014-04-29 LAB — CBC WITH DIFFERENTIAL/PLATELET
Basophils Absolute: 0 10*3/uL (ref 0.0–0.1)
Basophils Relative: 0 % (ref 0–1)
EOS PCT: 2 % (ref 0–5)
Eosinophils Absolute: 0.3 10*3/uL (ref 0.0–0.7)
HEMATOCRIT: 45.2 % (ref 36.0–46.0)
HEMOGLOBIN: 14.8 g/dL (ref 12.0–15.0)
LYMPHS ABS: 5.5 10*3/uL — AB (ref 0.7–4.0)
Lymphocytes Relative: 35 % (ref 12–46)
MCH: 32.1 pg (ref 26.0–34.0)
MCHC: 32.7 g/dL (ref 30.0–36.0)
MCV: 98 fL (ref 78.0–100.0)
MONOS PCT: 7 % (ref 3–12)
Monocytes Absolute: 1.1 10*3/uL — ABNORMAL HIGH (ref 0.1–1.0)
Neutro Abs: 8.8 10*3/uL — ABNORMAL HIGH (ref 1.7–7.7)
Neutrophils Relative %: 56 % (ref 43–77)
Platelets: 302 10*3/uL (ref 150–400)
RBC: 4.61 MIL/uL (ref 3.87–5.11)
RDW: 14.4 % (ref 11.5–15.5)
WBC: 15.7 10*3/uL — AB (ref 4.0–10.5)

## 2014-04-29 LAB — LIPASE, BLOOD: LIPASE: 32 U/L (ref 11–59)

## 2014-04-29 MED ORDER — ACETAMINOPHEN 325 MG PO TABS
650.0000 mg | ORAL_TABLET | Freq: Once | ORAL | Status: AC
  Administered 2014-04-29: 650 mg via ORAL
  Filled 2014-04-29: qty 2

## 2014-04-29 MED ORDER — ONDANSETRON HCL 4 MG PO TABS: 4.0000 mg | ORAL_TABLET | Freq: Four times a day (QID) | ORAL | Status: DC

## 2014-04-29 MED ORDER — AZITHROMYCIN 250 MG PO TABS: 250.0000 mg | ORAL_TABLET | Freq: Every day | ORAL | Status: DC

## 2014-04-29 MED ORDER — ONDANSETRON HCL 4 MG/2ML IJ SOLN
4.0000 mg | Freq: Once | INTRAMUSCULAR | Status: AC
  Administered 2014-04-29: 4 mg via INTRAVENOUS
  Filled 2014-04-29: qty 2

## 2014-04-29 MED ORDER — SODIUM CHLORIDE 0.9 % IV SOLN
Freq: Once | INTRAVENOUS | Status: AC
  Administered 2014-04-29: 07:00:00 via INTRAVENOUS

## 2014-04-29 MED ORDER — ALBUTEROL SULFATE HFA 108 (90 BASE) MCG/ACT IN AERS
2.0000 | INHALATION_SPRAY | RESPIRATORY_TRACT | Status: DC | PRN
  Administered 2014-04-29: 2 via RESPIRATORY_TRACT
  Filled 2014-04-29: qty 6.7

## 2014-04-29 MED ORDER — ALBUTEROL SULFATE (2.5 MG/3ML) 0.083% IN NEBU
5.0000 mg | INHALATION_SOLUTION | Freq: Once | RESPIRATORY_TRACT | Status: AC
  Administered 2014-04-29: 5 mg via RESPIRATORY_TRACT
  Filled 2014-04-29: qty 6

## 2014-04-29 NOTE — ED Notes (Signed)
Pt c/o vomiting since last night, unable to keep anything down, nonproductive cough since sunday

## 2014-04-29 NOTE — ED Notes (Signed)
MD at bedside. 

## 2014-04-29 NOTE — ED Provider Notes (Addendum)
CSN: [id number]     Arrival date & time 04/29/14  0620 History   First MD Initiated Contact with Patient 04/29/14 0703     Chief Complaint  Patient presents with  . Emesis     (Consider location/radiation/quality/duration/timing/severity/associated sxs/prior Treatment) Patient is a 49 y.o. female presenting with vomiting. The history is provided by the patient.  Emesis Severity:  Severe Duration:  2 hours Timing:  Constant Number of daily episodes:  5 times Quality:  Stomach contents Progression:  Improving Chronicity:  New Recent urination:  Normal Relieved by:  None tried Worsened by:  Nothing tried Ineffective treatments:  None tried Associated symptoms: cough, sore throat and URI   Associated symptoms: no abdominal pain, no diarrhea, no fever and no headaches   Cough:    Cough characteristics:  Non-productive and hacking   Severity:  Moderate   Onset quality:  Gradual   Duration:  4 days   Timing:  Constant   Progression:  Worsening   Chronicity:  Recurrent Risk factors: diabetes   Risk factors: no prior abdominal surgery, no suspect food intake and no travel to endemic areas     Past Medical History  Diagnosis Date  . PE (pulmonary embolism)   . Anxiety   . Diabetes mellitus without complication   . Acid reflux   . Depression   . Adrenal benign tumor     Apparently enlarging and patient "is supposed to have it taken out"  . Hypertension   . Stroke    Past Surgical History  Procedure Laterality Date  . No past surgeries    . Loop recorder implant N/A 03/25/2014    Procedure: LOOP RECORDER IMPLANT;  Surgeon: [name], MD;  Location: [facility] CATH LAB;  Service: Cardiovascular;  Laterality: N/A;  . Tee without cardioversion N/A 03/25/2014    Procedure: TRANSESOPHAGEAL ECHOCARDIOGRAM (TEE);  Surgeon: [name], MD;  Location: [facility] ENDOSCOPY;  Service: Cardiovascular;  Laterality: N/A;   Family History  Problem Relation Age of Onset  . Heart attack Father  77   History  Substance Use Topics  . Smoking status: Current Every Day Smoker -- 1.00 packs/day for 40 years    Types: Cigarettes  . Smokeless tobacco: Never Used  . Alcohol Use: No   OB History    No data available     Review of Systems  HENT: Positive for rhinorrhea, sore throat and voice change. Negative for trouble swallowing.   Respiratory: Positive for cough, shortness of breath and wheezing. Negative for chest tightness.   Cardiovascular: Negative for chest pain, palpitations and leg swelling.  Gastrointestinal: Positive for nausea and vomiting. Negative for abdominal pain and diarrhea.  Musculoskeletal: Negative for joint swelling.  Skin: Negative for rash.  Neurological: Negative for headaches.  All other systems reviewed and are negative.     Allergies  Amoxicillin; Aspirin; Codeine; and Penicillins  Home Medications   Prior to Admission medications   Medication Sig Start Date End Date Taking? Authorizing Provider  ALPRAZolam [name]) 1 MG tablet Take 1 mg by mouth 2 (two) times daily as needed for anxiety.   Yes Historical Provider, MD  clopidogrel (PLAVIX) 75 MG tablet Take 1 tablet (75 mg total) by mouth daily. 07/31/13  Yes [name], MD  lisinopril (PRINIVIL,ZESTRIL) 20 MG tablet Take 20 mg by mouth daily.   Yes Historical Provider, MD  metFORMIN (GLUCOPHAGE-XR) 500 MG 24 hr tablet Take 1 tablet (500 mg total) by mouth daily with breakfast. Patient taking  differently: Take 500 mg by mouth 4 (four) times daily.  10/24/12  Yes [name], PA-C  ondansetron [facility]) 4 MG/5ML solution Take 5 mLs by mouth every 6 (six) hours as needed. Nausea and vomitting 02/02/14  Yes Historical Provider, MD  PARoxetine (PAXIL) 40 MG tablet Take 40 mg by mouth every morning.   Yes Historical Provider, MD  pravastatin (PRAVACHOL) 10 MG tablet Take 1 tablet by mouth daily. 03/08/14  Yes Historical Provider, MD  fenofibrate 160 MG tablet Take 1 tablet (160 mg total) by mouth  daily. 07/31/13   [name], MD  HYDROcodone-acetaminophen (NORCO/VICODIN) 5-325 MG per tablet Take 1 tablet by mouth every 4 (four) hours as needed for moderate pain. Patient not taking: Reported on 03/24/2014 02/26/14   Jola Schmidt, MD  ondansetron [facility] ODT) 8 MG disintegrating tablet Take 1 tablet (8 mg total) by mouth every 8 (eight) hours as needed for nausea or vomiting. Patient not taking: Reported on 03/24/2014 02/26/14   Jola Schmidt, MD   BP 106/67 mmHg  Pulse 103  Temp(Src) 99.4 F (37.4 C)  Resp 20  Ht 5\' 6"  (1.676 m)  Wt 230 lb (104.327 kg)  BMI 37.14 kg/m2  SpO2 98%  LMP 04/15/2014 Physical Exam  Constitutional: She is oriented to person, place, and time. She appears well-developed and well-nourished. No distress.  HENT:  Head: Normocephalic and atraumatic.  Right Ear: Ear canal normal. A middle ear effusion is present.  Left Ear: Tympanic membrane and ear canal normal.  Nose: Mucosal edema and rhinorrhea present.  Mouth/Throat: Mucous membranes are dry. Posterior oropharyngeal erythema present. No oropharyngeal exudate or posterior oropharyngeal edema.  Eyes: Conjunctivae and EOM are normal. Pupils are equal, round, and reactive to light.  Neck: Normal range of motion. Neck supple.  Cardiovascular: Normal rate, regular rhythm and intact distal pulses.   No murmur heard. Pulmonary/Chest: Effort normal and breath sounds normal. No respiratory distress. She has no wheezes. She has no rales.  Abdominal: Soft. She exhibits no distension. There is no tenderness. There is no rebound and no guarding.  Musculoskeletal: Normal range of motion. She exhibits no edema or tenderness.  Neurological: She is alert and oriented to person, place, and time.  Skin: Skin is warm and dry. No rash noted. No erythema.  Psychiatric: She has a normal mood and affect. Her behavior is normal.  Nursing note and vitals reviewed.   ED Course  Procedures (including critical care time) Labs  Review Labs Reviewed  CBC WITH DIFFERENTIAL/PLATELET - Abnormal; Notable for the following:    WBC 15.7 (*)    Neutro Abs 8.8 (*)    Lymphs Abs 5.5 (*)    Monocytes Absolute 1.1 (*)    All other components within normal limits  COMPREHENSIVE METABOLIC PANEL - Abnormal; Notable for the following:    Glucose, Bld 133 (*)    Creatinine, Ser 1.29 (*)    Alkaline Phosphatase 37 (*)    GFR calc non Af Amer 47 (*)    GFR calc Af Amer 55 (*)    All other components within normal limits  LIPASE, BLOOD    Imaging Review Dg Chest 2 View  04/29/2014   CLINICAL DATA:  Vomiting for the past day.  Nonproductive cough.  EXAM: CHEST  2 VIEW  COMPARISON:  08/12/2013 chest radiograph.  FINDINGS: Loop recorder over the LEFT chest. Heart size upper limits normal. Clear lung fields. No bony abnormality. No visible subdiaphragmatic air. Similar appearance to priors.  IMPRESSION:  No active disease.   Electronically Signed   By: Rolla Flatten M.D.   On: 04/29/2014 07:34     EKG Interpretation   Date/Time:  Thursday April 29 2014 07:15:32 EDT Ventricular Rate:  96 PR Interval:  128 QRS Duration: 62 QT Interval:  324 QTC Calculation: 409 R Axis:   62 Text Interpretation:  Normal sinus rhythm Low voltage QRS No significant  change since last tracing Confirmed by Maryan Rued  MD, Loree Fee ([id number]) on  04/29/2014 7:20:29 AM      MDM   Final diagnoses:  Bronchitis    Pt with symptoms consistent with viral URI/bronchitis that started 4 days ago.  Symptoms are gradually worsening and then today woke up with vomiting.  Well appearing here.  No signs of breathing difficulty but decreased breath sounds and pt has benefited from inhaler in the past and is a smoker.  No signs of pharyngitis, otitis or abnormal abdominal findings.   Pt does have adrenal tumor that is being followed and currently is getting scheduled for surgery after she wears a loop recorder to evaluate for dysrhythmia.  Pt denies CP, abd or  urinary sx.  No diarrhea.  No med changes.  EKG wnl.  CBC with leukocytosis of 15,000. CMP with mild elevation of creatinine from 1.0-1.29. Lipase and chest x-ray pending. Patient will be given albuterol to be decreased breath sounds she also received Tylenol, Zofran and IV fluids.    7:38 AM Lipase and CXR wnl.  Will treat for bronchitis.  After inhaler patient is feeling better and moving air more freely. Will discharge home with azithromycin and Zofran.  Blanchie Dessert, MD 04/29/14 5638  Blanchie Dessert, MD 04/29/14 [phone]

## 2014-05-24 ENCOUNTER — Ambulatory Visit (INDEPENDENT_AMBULATORY_CARE_PROVIDER_SITE_OTHER): Payer: BLUE CROSS/BLUE SHIELD | Admitting: *Deleted

## 2014-05-24 DIAGNOSIS — Z8673 Personal history of transient ischemic attack (TIA), and cerebral infarction without residual deficits: Secondary | ICD-10-CM | POA: Diagnosis not present

## 2014-05-24 DIAGNOSIS — Z4509 Encounter for adjustment and management of other cardiac device: Secondary | ICD-10-CM

## 2014-05-24 DIAGNOSIS — I6930 Unspecified sequelae of cerebral infarction: Secondary | ICD-10-CM

## 2014-05-25 ENCOUNTER — Emergency Department (HOSPITAL_COMMUNITY)
Admission: EM | Admit: 2014-05-25 | Discharge: 2014-05-25 | Disposition: A | Discharge status: Home / Self Care | Payer: BLUE CROSS/BLUE SHIELD | Attending: Emergency Medicine | Admitting: Emergency Medicine

## 2014-05-25 ENCOUNTER — Encounter (HOSPITAL_COMMUNITY): Payer: Self-pay

## 2014-05-25 DIAGNOSIS — Z86711 Personal history of pulmonary embolism: Secondary | ICD-10-CM | POA: Diagnosis not present

## 2014-05-25 DIAGNOSIS — Z79899 Other long term (current) drug therapy: Secondary | ICD-10-CM | POA: Insufficient documentation

## 2014-05-25 DIAGNOSIS — E329 Disease of thymus, unspecified: Secondary | ICD-10-CM | POA: Diagnosis not present

## 2014-05-25 DIAGNOSIS — Z880 Allergy status to penicillin: Secondary | ICD-10-CM | POA: Insufficient documentation

## 2014-05-25 DIAGNOSIS — R630 Anorexia: Secondary | ICD-10-CM | POA: Diagnosis not present

## 2014-05-25 DIAGNOSIS — I10 Essential (primary) hypertension: Secondary | ICD-10-CM | POA: Insufficient documentation

## 2014-05-25 DIAGNOSIS — E119 Type 2 diabetes mellitus without complications: Secondary | ICD-10-CM | POA: Insufficient documentation

## 2014-05-25 DIAGNOSIS — R112 Nausea with vomiting, unspecified: Secondary | ICD-10-CM

## 2014-05-25 DIAGNOSIS — Z8719 Personal history of other diseases of the digestive system: Secondary | ICD-10-CM | POA: Insufficient documentation

## 2014-05-25 DIAGNOSIS — F419 Anxiety disorder, unspecified: Secondary | ICD-10-CM | POA: Diagnosis not present

## 2014-05-25 DIAGNOSIS — Z3202 Encounter for pregnancy test, result negative: Secondary | ICD-10-CM | POA: Diagnosis not present

## 2014-05-25 DIAGNOSIS — Z7902 Long term (current) use of antithrombotics/antiplatelets: Secondary | ICD-10-CM | POA: Insufficient documentation

## 2014-05-25 DIAGNOSIS — Z792 Long term (current) use of antibiotics: Secondary | ICD-10-CM | POA: Diagnosis not present

## 2014-05-25 DIAGNOSIS — R109 Unspecified abdominal pain: Secondary | ICD-10-CM | POA: Diagnosis present

## 2014-05-25 DIAGNOSIS — Z720 Tobacco use: Secondary | ICD-10-CM | POA: Diagnosis not present

## 2014-05-25 DIAGNOSIS — M545 Low back pain, unspecified: Secondary | ICD-10-CM

## 2014-05-25 DIAGNOSIS — Z8673 Personal history of transient ischemic attack (TIA), and cerebral infarction without residual deficits: Secondary | ICD-10-CM | POA: Diagnosis not present

## 2014-05-25 DIAGNOSIS — R1084 Generalized abdominal pain: Secondary | ICD-10-CM

## 2014-05-25 LAB — COMPREHENSIVE METABOLIC PANEL
ALBUMIN: 4.4 g/dL (ref 3.5–5.0)
ALT: 19 U/L (ref 14–54)
AST: 21 U/L (ref 15–41)
Alkaline Phosphatase: 34 U/L — ABNORMAL LOW (ref 38–126)
Anion gap: 11 (ref 5–15)
BUN: 17 mg/dL (ref 6–20)
CO2: 22 mmol/L (ref 22–32)
Calcium: 9.4 mg/dL (ref 8.9–10.3)
Chloride: 104 mmol/L (ref 101–111)
Creatinine, Ser: 1.37 mg/dL — ABNORMAL HIGH (ref 0.44–1.00)
GFR calc Af Amer: 51 mL/min — ABNORMAL LOW (ref >60)
GFR calc non Af Amer: 44 mL/min — ABNORMAL LOW (ref >60)
Glucose, Bld: 116 mg/dL — ABNORMAL HIGH (ref 65–99)
POTASSIUM: 4.1 mmol/L (ref 3.5–5.1)
SODIUM: 137 mmol/L (ref 135–145)
TOTAL PROTEIN: 7.4 g/dL (ref 6.5–8.1)
Total Bilirubin: 0.7 mg/dL (ref 0.3–1.2)

## 2014-05-25 LAB — URINALYSIS, ROUTINE W REFLEX MICROSCOPIC
Bilirubin Urine: NEGATIVE
Glucose, UA: NEGATIVE mg/dL
Hgb urine dipstick: NEGATIVE
Ketones, ur: NEGATIVE mg/dL
Leukocytes, UA: NEGATIVE
NITRITE: NEGATIVE
Protein, ur: NEGATIVE mg/dL
SPECIFIC GRAVITY, URINE: 1.004 — AB (ref 1.005–1.030)
Urobilinogen, UA: 0.2 mg/dL (ref 0.0–1.0)
pH: 5.5 (ref 5.0–8.0)

## 2014-05-25 LAB — CBC WITH DIFFERENTIAL/PLATELET
BASOS ABS: 0 10*3/uL (ref 0.0–0.1)
Basophils Relative: 0 % (ref 0–1)
EOS PCT: 2 % (ref 0–5)
Eosinophils Absolute: 0.3 10*3/uL (ref 0.0–0.7)
HCT: 45.8 % (ref 36.0–46.0)
Hemoglobin: 15.1 g/dL — ABNORMAL HIGH (ref 12.0–15.0)
LYMPHS ABS: 6.2 10*3/uL — AB (ref 0.7–4.0)
Lymphocytes Relative: 41 % (ref 12–46)
MCH: 32.3 pg (ref 26.0–34.0)
MCHC: 33 g/dL (ref 30.0–36.0)
MCV: 97.9 fL (ref 78.0–100.0)
MONO ABS: 0.8 10*3/uL (ref 0.1–1.0)
MONOS PCT: 5 % (ref 3–12)
Neutro Abs: 7.7 10*3/uL (ref 1.7–7.7)
Neutrophils Relative %: 52 % (ref 43–77)
PLATELETS: 269 10*3/uL (ref 150–400)
RBC: 4.68 MIL/uL (ref 3.87–5.11)
RDW: 14.3 % (ref 11.5–15.5)
WBC: 15 10*3/uL — AB (ref 4.0–10.5)

## 2014-05-25 LAB — CUP PACEART REMOTE DEVICE CHECK: MDC IDC SESS DTM: 20160517154310

## 2014-05-25 LAB — POC URINE PREG, ED: PREG TEST UR: NEGATIVE

## 2014-05-25 MED ORDER — DICYCLOMINE HCL 20 MG PO TABS: 20.0000 mg | ORAL_TABLET | Freq: Two times a day (BID) | ORAL | Status: DC

## 2014-05-25 MED ORDER — DICYCLOMINE HCL 10 MG/ML IM SOLN
20.0000 mg | Freq: Once | INTRAMUSCULAR | Status: AC
  Administered 2014-05-25: 20 mg via INTRAMUSCULAR
  Filled 2014-05-25: qty 2

## 2014-05-25 MED ORDER — ONDANSETRON HCL 4 MG/2ML IJ SOLN
4.0000 mg | Freq: Once | INTRAMUSCULAR | Status: AC
  Administered 2014-05-25: 4 mg via INTRAVENOUS
  Filled 2014-05-25: qty 2

## 2014-05-25 MED ORDER — PROMETHAZINE HCL 25 MG PO TABS: 25.0000 mg | ORAL_TABLET | Freq: Four times a day (QID) | ORAL | Status: DC | PRN

## 2014-05-25 MED ORDER — SODIUM CHLORIDE 0.9 % IV BOLUS (SEPSIS)
1000.0000 mL | Freq: Once | INTRAVENOUS | Status: AC
  Administered 2014-05-25: 1000 mL via INTRAVENOUS

## 2014-05-25 MED ORDER — MORPHINE SULFATE 4 MG/ML IJ SOLN
4.0000 mg | Freq: Once | INTRAMUSCULAR | Status: AC
  Administered 2014-05-25: 4 mg via INTRAVENOUS
  Filled 2014-05-25: qty 1

## 2014-05-25 NOTE — Discharge Instructions (Signed)
Abdominal Pain, Women °Abdominal (stomach, pelvic, or belly) pain can be caused by many things. It is important to tell your doctor: °· The location of the pain. °· Does it come and go or is it present all the time? °· Are there things that start the pain (eating certain foods, exercise)? °· Are there other symptoms associated with the pain (fever, nausea, vomiting, diarrhea)? °All of this is helpful to know when trying to find the cause of the pain. °CAUSES  °· Stomach: virus or bacteria infection, or ulcer. °· Intestine: appendicitis (inflamed appendix), regional ileitis (Crohn's disease), ulcerative colitis (inflamed colon), irritable bowel syndrome, diverticulitis (inflamed diverticulum of the colon), or cancer of the stomach or intestine. °· Gallbladder disease or stones in the gallbladder. °· Kidney disease, kidney stones, or infection. °· Pancreas infection or cancer. °· Fibromyalgia (pain disorder). °· Diseases of the female organs: °¨ Uterus: fibroid (non-cancerous) tumors or infection. °¨ Fallopian tubes: infection or tubal pregnancy. °¨ Ovary: cysts or tumors. °¨ Pelvic adhesions (scar tissue). °¨ Endometriosis (uterus lining tissue growing in the pelvis and on the pelvic organs). °¨ Pelvic congestion syndrome (female organs filling up with blood just before the menstrual period). °¨ Pain with the menstrual period. °¨ Pain with ovulation (producing an egg). °¨ Pain with an IUD (intrauterine device, birth control) in the uterus. °¨ Cancer of the female organs. °· Functional pain (pain not caused by a disease, may improve without treatment). °· Psychological pain. °· Depression. °DIAGNOSIS  °Your doctor will decide the seriousness of your pain by doing an examination. °· Blood tests. °· X-rays. °· Ultrasound. °· CT scan (computed tomography, special type of X-ray). °· MRI (magnetic resonance imaging). °· Cultures, for infection. °· Barium enema (dye inserted in the large intestine, to better view it with  X-rays). °· Colonoscopy (looking in intestine with a lighted tube). °· Laparoscopy (minor surgery, looking in abdomen with a lighted tube). °· Major abdominal exploratory surgery (looking in abdomen with a large incision). °TREATMENT  °The treatment will depend on the cause of the pain.  °· Many cases can be observed and treated at home. °· Over-the-counter medicines recommended by your caregiver. °· Prescription medicine. °· Antibiotics, for infection. °· Birth control pills, for painful periods or for ovulation pain. °· Hormone treatment, for endometriosis. °· Nerve blocking injections. °· Physical therapy. °· Antidepressants. °· Counseling with a psychologist or psychiatrist. °· Minor or major surgery. °HOME CARE INSTRUCTIONS  °· Do not take laxatives, unless directed by your caregiver. °· Take over-the-counter pain medicine only if ordered by your caregiver. Do not take aspirin because it can cause an upset stomach or bleeding. °· Try a clear liquid diet (broth or water) as ordered by your caregiver. Slowly move to a bland diet, as tolerated, if the pain is related to the stomach or intestine. °· Have a thermometer and take your temperature several times a day, and record it. °· Bed rest and sleep, if it helps the pain. °· Avoid sexual intercourse, if it causes pain. °· Avoid stressful situations. °· Keep your follow-up appointments and tests, as your caregiver orders. °· If the pain does not go away with medicine or surgery, you may try: °¨ Acupuncture. °¨ Relaxation exercises (yoga, meditation). °¨ Group therapy. °¨ Counseling. °SEEK MEDICAL CARE IF:  °· You notice certain foods cause stomach pain. °· Your home care treatment is not helping your pain. °· You need stronger pain medicine. °· You want your IUD removed. °· You feel faint or   lightheaded. °· You develop nausea and vomiting. °· You develop a rash. °· You are having side effects or an allergy to your medicine. °SEEK IMMEDIATE MEDICAL CARE IF:  °· Your  pain does not go away or gets worse. °· You have a fever. °· Your pain is felt only in portions of the abdomen. The right side could possibly be appendicitis. The left lower portion of the abdomen could be colitis or diverticulitis. °· You are passing blood in your stools (bright red or black tarry stools, with or without vomiting). °· You have blood in your urine. °· You develop chills, with or without a fever. °· You pass out. °MAKE SURE YOU:  °· Understand these instructions. °· Will watch your condition. °· Will get help right away if you are not doing well or get worse. °Document Released: 10/22/2006 Document Revised: 05/11/2013 Document Reviewed: 11/11/2008 °ExitCare® Patient Information ©2015 [employer]. This information is not intended to replace advice given to you by your health care provider. Make sure you discuss any questions you have with your health care provider. ° °

## 2014-05-25 NOTE — ED Notes (Signed)
Patient given po gingerale for fluid challenge.

## 2014-05-25 NOTE — ED Notes (Signed)
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 Pt having back pain started on yesterday.  Pt also with abdominal pain since yesterday.  N/V.  No diarrhea.  No fever.  No change in urination.  No change in bowels.

## 2014-05-25 NOTE — ED Provider Notes (Signed)
CSN: [id number]     Arrival date & time 05/25/14  1032 History   First MD Initiated Contact with Patient 05/25/14 1201     Chief Complaint  Patient presents with  . Back Pain  . Abdominal Pain     (Consider location/radiation/quality/duration/timing/severity/associated sxs/prior Treatment) HPI  Pt is a 49yo female with hx of PE, anxiety, DM, acid reflux, depression, benign adrenal tumor, HTN, and stroke, presenting to ED with c/o diffuse abdominal pain with associated nausea and vomiting that started yesterday.  Abdominal pain is aching and sore, constant, 10/10 at worst. Pt reports taking leftover nausea medication w/o relief of symptoms. Reports 3 episodes of non-bloody non-bilious emesis within 24 hours. No diarrhea. Denies urinary or vaginal symptoms. Pt also c/o bilateral lower back pain that is aching and sore. No hx of renal stones. No sick contacts or recent travel.  No hx of abdominal surgeries. Pt denies CP or SOB.   Past Medical History  Diagnosis Date  . PE (pulmonary embolism)   . Anxiety   . Diabetes mellitus without complication   . Acid reflux   . Depression   . Adrenal benign tumor     Apparently enlarging and patient "is supposed to have it taken out"  . Hypertension   . Stroke    Past Surgical History  Procedure Laterality Date  . No past surgeries    . Loop recorder implant N/A 03/25/2014    Procedure: LOOP RECORDER IMPLANT;  Surgeon: [name], MD;  Location: [facility] CATH LAB;  Service: Cardiovascular;  Laterality: N/A;  . Tee without cardioversion N/A 03/25/2014    Procedure: TRANSESOPHAGEAL ECHOCARDIOGRAM (TEE);  Surgeon: [name], MD;  Location: [facility] ENDOSCOPY;  Service: Cardiovascular;  Laterality: N/A;   Family History  Problem Relation Age of Onset  . Heart attack Father 54   History  Substance Use Topics  . Smoking status: Current Every Day Smoker -- 1.00 packs/day for 40 years    Types: Cigarettes  . Smokeless tobacco: Never Used  . Alcohol  Use: No   OB History    No data available     Review of Systems  Constitutional: Positive for appetite change. Negative for fever and chills.  Respiratory: Negative for cough and shortness of breath.   Cardiovascular: Negative for chest pain and palpitations.  Gastrointestinal: Positive for nausea, vomiting and abdominal pain. Negative for diarrhea and constipation.  Genitourinary: Negative for dysuria, urgency, frequency, hematuria and flank pain.  Musculoskeletal: Positive for back pain. Negative for myalgias.  Skin: Negative for rash.  All other systems reviewed and are negative.     Allergies  Amoxicillin; Aspirin; Codeine; and Penicillins  Home Medications   Prior to Admission medications   Medication Sig Start Date End Date Taking? Authorizing Provider  ALPRAZolam [name]) 1 MG tablet Take 1 mg by mouth 2 (two) times daily as needed for anxiety.   Yes Historical Provider, MD  clopidogrel (PLAVIX) 75 MG tablet Take 1 tablet (75 mg total) by mouth daily. 07/31/13  Yes [name], MD  fenofibrate 160 MG tablet Take 1 tablet (160 mg total) by mouth daily. 07/31/13  Yes [name], MD  lisinopril (PRINIVIL,ZESTRIL) 20 MG tablet Take 20 mg by mouth daily.   Yes Historical Provider, MD  metFORMIN (GLUCOPHAGE-XR) 500 MG 24 hr tablet Take 1 tablet (500 mg total) by mouth daily with breakfast. Patient taking differently: Take 500 mg by mouth 4 (four) times daily.  10/24/12  Yes Fransico Meadow, PA-C  ondansetron (ZOFRAN) 4 MG tablet Take 1 tablet (4 mg total) by mouth every 6 (six) hours. 04/29/14  Yes Blanchie Dessert, MD  PARoxetine (PAXIL) 40 MG tablet Take 40 mg by mouth every morning.   Yes Historical Provider, MD  pravastatin (PRAVACHOL) 10 MG tablet Take 1 tablet by mouth daily. 03/08/14  Yes Historical Provider, MD  azithromycin (ZITHROMAX) 250 MG tablet Take 1 tablet (250 mg total) by mouth daily. Take first 2 tablets together, then 1 every day until finished. Patient not  taking: Reported on 05/25/2014 04/29/14   Blanchie Dessert, MD  dicyclomine (BENTYL) 20 MG tablet Take 1 tablet (20 mg total) by mouth 2 (two) times daily. 05/25/14   [name], PA-C  HYDROcodone-acetaminophen (NORCO/VICODIN) 5-325 MG per tablet Take 1 tablet by mouth every 4 (four) hours as needed for moderate pain. Patient not taking: Reported on 03/24/2014 02/26/14   Jola Schmidt, MD  ondansetron [facility] ODT) 8 MG disintegrating tablet Take 1 tablet (8 mg total) by mouth every 8 (eight) hours as needed for nausea or vomiting. Patient not taking: Reported on 05/25/2014 02/26/14   Jola Schmidt, MD  promethazine (PHENERGAN) 25 MG tablet Take 1 tablet (25 mg total) by mouth every 6 (six) hours as needed for nausea or vomiting. 05/25/14   [name], PA-C   BP 106/61 mmHg  Pulse 84  Temp(Src) 98.3 F (36.8 C) (Oral)  Resp 16  SpO2 98%  LMP 05/15/2014 Physical Exam  Constitutional: She appears well-developed and well-nourished. No distress.  HENT:  Head: Normocephalic and atraumatic.  Eyes: Conjunctivae are normal. No scleral icterus.  Neck: Normal range of motion.  Cardiovascular: Normal rate, regular rhythm and normal heart sounds.   Pulmonary/Chest: Effort normal and breath sounds normal. No respiratory distress. She has no wheezes. She has no rales. She exhibits no tenderness.  Abdominal: Soft. Bowel sounds are normal. She exhibits no distension and no mass. There is tenderness. There is no rebound and no guarding.  Soft, non-distended, diffuse tenderness. No rebound, guarding, or masses. No CVAT  Musculoskeletal: Normal range of motion. She exhibits tenderness.  No midline spinal tenderness. Tenderness to bilateral lumbar muscles. FROM upper and lower extremities with 5/5 strength bilaterally.   Neurological: She is alert.  Skin: Skin is warm and dry. She is not diaphoretic.  Nursing note and vitals reviewed.   ED Course  Procedures (including critical care time) Labs Review Labs  Reviewed  CBC WITH DIFFERENTIAL/PLATELET - Abnormal; Notable for the following:    WBC 15.0 (*)    Hemoglobin 15.1 (*)    Lymphs Abs 6.2 (*)    All other components within normal limits  COMPREHENSIVE METABOLIC PANEL - Abnormal; Notable for the following:    Glucose, Bld 116 (*)    Creatinine, Ser 1.37 (*)    Alkaline Phosphatase 34 (*)    GFR calc non Af Amer 44 (*)    GFR calc Af Amer 51 (*)    All other components within normal limits  URINALYSIS, ROUTINE W REFLEX MICROSCOPIC - Abnormal; Notable for the following:    Specific Gravity, Urine 1.004 (*)    All other components within normal limits  PATHOLOGIST SMEAR REVIEW  POC URINE PREG, ED    Imaging Review No results found.   EKG Interpretation None      MDM   Final diagnoses:  Generalized abdominal pain  Non-intractable vomiting with nausea, vomiting of unspecified type  Bilateral low back pain without sciatica   Pt is a 49yo  female c/o diffuse generalized abdominal pain with nausea and vomiting. Symptoms started yesterday. Pt appears well, non-toxic. Diffuse abdominal tenderness on exam. No rebound or guarding.  Low back pain is reproducible with palpation of lumbar muscles. No midline spinal tenderness.   Vitals: WNL.  Previous medical records reviewed, including a normal CT abd on 02/26/14.  Pt had a stable Left renal mass noted, no acute findings.  Pt has not focal tenderness today.   Labs: leukocytosis at 15, slight elevation in Cr at 1.37 up from 1.29 three weeks ago.   Pt given IV fluids, morphine and zofran. No vomiting in ED.  abdomen remains soft, mild diffuse tenderness. No indication for repeat imaging as no focal tenderness, rebound or guarding. Pt is afebrile. Symptoms likely viral in nature.  Pt able to keep down several ounces of PO fluids.  Will tx symptomatically for abdominal cramping and nausea. Rx: bentyl and phenergan. Due to reports of abdominal intermittently over the last several months, will refer  to [facility].  Rx: phenergan and bentyl.  Home care instructions provided. Return precautions provided. Pt verbalized understanding and agreement with tx plan.     [name], PA-C 05/25/14 [place], MD 05/25/14 [phone]

## 2014-05-26 LAB — PATHOLOGIST SMEAR REVIEW

## 2014-05-28 NOTE — Progress Notes (Signed)
Loop recorder 

## 2014-06-10 LAB — CUP PACEART REMOTE DEVICE CHECK: Date Time Interrogation Session: 20160602094817

## 2014-06-16 ENCOUNTER — Encounter: Payer: Self-pay | Admitting: Internal Medicine

## 2014-06-23 ENCOUNTER — Ambulatory Visit (INDEPENDENT_AMBULATORY_CARE_PROVIDER_SITE_OTHER): Payer: BLUE CROSS/BLUE SHIELD | Admitting: *Deleted

## 2014-06-23 DIAGNOSIS — Z8673 Personal history of transient ischemic attack (TIA), and cerebral infarction without residual deficits: Secondary | ICD-10-CM | POA: Diagnosis not present

## 2014-06-23 DIAGNOSIS — I6930 Unspecified sequelae of cerebral infarction: Secondary | ICD-10-CM

## 2014-06-23 NOTE — Progress Notes (Signed)
[id number] [name]

## 2014-07-01 LAB — CUP PACEART REMOTE DEVICE CHECK: MDC IDC SESS DTM: 20160623163307

## 2014-07-23 ENCOUNTER — Ambulatory Visit (INDEPENDENT_AMBULATORY_CARE_PROVIDER_SITE_OTHER): Payer: BLUE CROSS/BLUE SHIELD | Admitting: *Deleted

## 2014-07-23 ENCOUNTER — Encounter: Payer: Self-pay | Admitting: Internal Medicine

## 2014-07-23 DIAGNOSIS — Z4509 Encounter for adjustment and management of other cardiac device: Secondary | ICD-10-CM

## 2014-07-23 DIAGNOSIS — I6930 Unspecified sequelae of cerebral infarction: Secondary | ICD-10-CM

## 2014-07-23 DIAGNOSIS — Z8673 Personal history of transient ischemic attack (TIA), and cerebral infarction without residual deficits: Secondary | ICD-10-CM

## 2014-07-27 ENCOUNTER — Encounter: Payer: Self-pay | Admitting: Internal Medicine

## 2014-07-27 NOTE — Progress Notes (Signed)
Loop recorder 

## 2014-07-30 LAB — CUP PACEART REMOTE DEVICE CHECK: MDC IDC SESS DTM: 20160715040500

## 2014-08-06 ENCOUNTER — Encounter: Payer: Self-pay | Admitting: Internal Medicine

## 2014-08-23 ENCOUNTER — Ambulatory Visit (INDEPENDENT_AMBULATORY_CARE_PROVIDER_SITE_OTHER): Payer: BLUE CROSS/BLUE SHIELD | Admitting: *Deleted

## 2014-08-23 ENCOUNTER — Encounter: Payer: Self-pay | Admitting: Internal Medicine

## 2014-08-23 DIAGNOSIS — Z8673 Personal history of transient ischemic attack (TIA), and cerebral infarction without residual deficits: Secondary | ICD-10-CM | POA: Diagnosis not present

## 2014-08-23 DIAGNOSIS — I6930 Unspecified sequelae of cerebral infarction: Secondary | ICD-10-CM

## 2014-08-24 NOTE — Progress Notes (Signed)
Loop recorder 

## 2014-09-06 LAB — CUP PACEART REMOTE DEVICE CHECK: Date Time Interrogation Session: 20160829112823

## 2014-09-21 ENCOUNTER — Ambulatory Visit (INDEPENDENT_AMBULATORY_CARE_PROVIDER_SITE_OTHER): Payer: BLUE CROSS/BLUE SHIELD | Admitting: *Deleted

## 2014-09-21 DIAGNOSIS — Z8673 Personal history of transient ischemic attack (TIA), and cerebral infarction without residual deficits: Secondary | ICD-10-CM

## 2014-09-21 DIAGNOSIS — I6930 Unspecified sequelae of cerebral infarction: Secondary | ICD-10-CM

## 2014-09-22 NOTE — Progress Notes (Signed)
Loop recorder 

## 2014-09-29 ENCOUNTER — Encounter: Payer: Self-pay | Admitting: Internal Medicine

## 2014-09-30 ENCOUNTER — Telehealth: Payer: Self-pay | Admitting: Internal Medicine

## 2014-09-30 ENCOUNTER — Encounter: Payer: Self-pay | Admitting: Internal Medicine

## 2014-09-30 NOTE — Telephone Encounter (Signed)
ERROR

## 2014-10-16 LAB — CUP PACEART REMOTE DEVICE CHECK: MDC IDC SESS DTM: 20160913163810

## 2014-10-16 NOTE — Progress Notes (Signed)
Carelink summary report received. Battery status OK. Normal device function. No new symptom episodes, brady, or pause episodes. No new AF episodes. 3 tachy episodes---T-wave oversensing. Monthly summary reports and ROV w/ SK 3/17.

## 2014-10-19 ENCOUNTER — Encounter: Payer: Self-pay | Admitting: Internal Medicine

## 2014-10-21 ENCOUNTER — Ambulatory Visit (INDEPENDENT_AMBULATORY_CARE_PROVIDER_SITE_OTHER): Payer: BLUE CROSS/BLUE SHIELD | Admitting: *Deleted

## 2014-10-21 DIAGNOSIS — I6930 Unspecified sequelae of cerebral infarction: Secondary | ICD-10-CM

## 2014-10-21 DIAGNOSIS — Z8673 Personal history of transient ischemic attack (TIA), and cerebral infarction without residual deficits: Secondary | ICD-10-CM | POA: Diagnosis not present

## 2014-10-21 LAB — CUP PACEART REMOTE DEVICE CHECK: Date Time Interrogation Session: 20161013163714

## 2014-10-29 NOTE — Progress Notes (Signed)
Loop recorder 

## 2014-10-29 NOTE — Progress Notes (Addendum)
Carelink summary report received. Battery status OK. Normal device function. No new symptom episodes, tachy episodes, or pause episodes. 1 pause episodes--undersensing. No new AF episodes. Monthly summary reports and ROV with SK in 03/2015.

## 2014-11-22 ENCOUNTER — Encounter: Payer: Self-pay | Admitting: Internal Medicine

## 2014-11-22 ENCOUNTER — Ambulatory Visit (INDEPENDENT_AMBULATORY_CARE_PROVIDER_SITE_OTHER): Payer: BLUE CROSS/BLUE SHIELD | Admitting: *Deleted

## 2014-11-22 DIAGNOSIS — I6930 Unspecified sequelae of cerebral infarction: Secondary | ICD-10-CM

## 2014-11-22 DIAGNOSIS — Z8673 Personal history of transient ischemic attack (TIA), and cerebral infarction without residual deficits: Secondary | ICD-10-CM | POA: Diagnosis not present

## 2014-11-22 NOTE — Progress Notes (Signed)
Carelink summary report / loop recorder 

## 2014-12-20 ENCOUNTER — Ambulatory Visit (INDEPENDENT_AMBULATORY_CARE_PROVIDER_SITE_OTHER): Payer: BLUE CROSS/BLUE SHIELD | Admitting: *Deleted

## 2014-12-20 DIAGNOSIS — Z8673 Personal history of transient ischemic attack (TIA), and cerebral infarction without residual deficits: Secondary | ICD-10-CM | POA: Diagnosis not present

## 2014-12-20 DIAGNOSIS — I6930 Unspecified sequelae of cerebral infarction: Secondary | ICD-10-CM

## 2014-12-20 NOTE — Progress Notes (Signed)
Carelink Summary Report / Loop Recorder 

## 2014-12-27 LAB — CUP PACEART REMOTE DEVICE CHECK: Date Time Interrogation Session: 20161112163734

## 2015-01-19 ENCOUNTER — Ambulatory Visit (INDEPENDENT_AMBULATORY_CARE_PROVIDER_SITE_OTHER): Payer: BLUE CROSS/BLUE SHIELD | Admitting: *Deleted

## 2015-01-19 DIAGNOSIS — I6930 Unspecified sequelae of cerebral infarction: Secondary | ICD-10-CM

## 2015-01-19 DIAGNOSIS — Z8673 Personal history of transient ischemic attack (TIA), and cerebral infarction without residual deficits: Secondary | ICD-10-CM | POA: Diagnosis not present

## 2015-01-20 NOTE — Progress Notes (Signed)
Carelink Summary Report / Loop Recorder 

## 2015-01-30 ENCOUNTER — Emergency Department (HOSPITAL_COMMUNITY)
Admission: EM | Admit: 2015-01-30 | Discharge: 2015-01-31 | Disposition: A | Discharge status: Home / Self Care | Payer: BLUE CROSS/BLUE SHIELD | Attending: Emergency Medicine | Admitting: Emergency Medicine

## 2015-01-30 ENCOUNTER — Encounter (HOSPITAL_COMMUNITY): Payer: Self-pay | Admitting: Emergency Medicine

## 2015-01-30 DIAGNOSIS — Z7984 Long term (current) use of oral hypoglycemic drugs: Secondary | ICD-10-CM | POA: Diagnosis not present

## 2015-01-30 DIAGNOSIS — R112 Nausea with vomiting, unspecified: Secondary | ICD-10-CM

## 2015-01-30 DIAGNOSIS — Z8719 Personal history of other diseases of the digestive system: Secondary | ICD-10-CM | POA: Diagnosis not present

## 2015-01-30 DIAGNOSIS — F419 Anxiety disorder, unspecified: Secondary | ICD-10-CM | POA: Diagnosis not present

## 2015-01-30 DIAGNOSIS — E119 Type 2 diabetes mellitus without complications: Secondary | ICD-10-CM | POA: Diagnosis not present

## 2015-01-30 DIAGNOSIS — Z7902 Long term (current) use of antithrombotics/antiplatelets: Secondary | ICD-10-CM | POA: Diagnosis not present

## 2015-01-30 DIAGNOSIS — Z880 Allergy status to penicillin: Secondary | ICD-10-CM | POA: Insufficient documentation

## 2015-01-30 DIAGNOSIS — Z86711 Personal history of pulmonary embolism: Secondary | ICD-10-CM | POA: Insufficient documentation

## 2015-01-30 DIAGNOSIS — Z8673 Personal history of transient ischemic attack (TIA), and cerebral infarction without residual deficits: Secondary | ICD-10-CM | POA: Insufficient documentation

## 2015-01-30 DIAGNOSIS — N289 Disorder of kidney and ureter, unspecified: Secondary | ICD-10-CM | POA: Diagnosis not present

## 2015-01-30 DIAGNOSIS — M545 Low back pain, unspecified: Secondary | ICD-10-CM

## 2015-01-30 DIAGNOSIS — I10 Essential (primary) hypertension: Secondary | ICD-10-CM | POA: Insufficient documentation

## 2015-01-30 DIAGNOSIS — Z86018 Personal history of other benign neoplasm: Secondary | ICD-10-CM | POA: Insufficient documentation

## 2015-01-30 DIAGNOSIS — F329 Major depressive disorder, single episode, unspecified: Secondary | ICD-10-CM | POA: Diagnosis not present

## 2015-01-30 DIAGNOSIS — F17210 Nicotine dependence, cigarettes, uncomplicated: Secondary | ICD-10-CM | POA: Insufficient documentation

## 2015-01-30 DIAGNOSIS — Z79899 Other long term (current) drug therapy: Secondary | ICD-10-CM | POA: Insufficient documentation

## 2015-01-30 DIAGNOSIS — R109 Unspecified abdominal pain: Secondary | ICD-10-CM | POA: Diagnosis not present

## 2015-01-30 LAB — CBC
HEMATOCRIT: 44.9 % (ref 36.0–46.0)
Hemoglobin: 14.8 g/dL (ref 12.0–15.0)
MCH: 32 pg (ref 26.0–34.0)
MCHC: 33 g/dL (ref 30.0–36.0)
MCV: 97.2 fL (ref 78.0–100.0)
Platelets: 335 10*3/uL (ref 150–400)
RBC: 4.62 MIL/uL (ref 3.87–5.11)
RDW: 14.5 % (ref 11.5–15.5)
WBC: 11.3 10*3/uL — AB (ref 4.0–10.5)

## 2015-01-30 LAB — COMPREHENSIVE METABOLIC PANEL
ALBUMIN: 3.9 g/dL (ref 3.5–5.0)
ALT: 27 U/L (ref 14–54)
ANION GAP: 10 (ref 5–15)
AST: 23 U/L (ref 15–41)
Alkaline Phosphatase: 37 U/L — ABNORMAL LOW (ref 38–126)
BILIRUBIN TOTAL: 0.5 mg/dL (ref 0.3–1.2)
BUN: 18 mg/dL (ref 6–20)
CHLORIDE: 108 mmol/L (ref 101–111)
CO2: 20 mmol/L — ABNORMAL LOW (ref 22–32)
Calcium: 9 mg/dL (ref 8.9–10.3)
Creatinine, Ser: 1.74 mg/dL — ABNORMAL HIGH (ref 0.44–1.00)
GFR calc Af Amer: 38 mL/min — ABNORMAL LOW (ref >60)
GFR, EST NON AFRICAN AMERICAN: 33 mL/min — AB (ref >60)
GLUCOSE: 111 mg/dL — AB (ref 65–99)
POTASSIUM: 4.1 mmol/L (ref 3.5–5.1)
Sodium: 138 mmol/L (ref 135–145)
TOTAL PROTEIN: 6.8 g/dL (ref 6.5–8.1)

## 2015-01-30 LAB — LIPASE, BLOOD: Lipase: 28 U/L (ref 11–51)

## 2015-01-30 MED ORDER — MORPHINE SULFATE (PF) 4 MG/ML IV SOLN
4.0000 mg | Freq: Once | INTRAVENOUS | Status: AC
  Administered 2015-01-31: 4 mg via INTRAVENOUS
  Filled 2015-01-30: qty 1

## 2015-01-30 MED ORDER — ONDANSETRON 4 MG PO TBDP
ORAL_TABLET | ORAL | Status: AC
  Filled 2015-01-30: qty 1

## 2015-01-30 MED ORDER — SODIUM CHLORIDE 0.9 % IV SOLN
1000.0000 mL | INTRAVENOUS | Status: DC
  Administered 2015-01-31: 1000 mL via INTRAVENOUS

## 2015-01-30 MED ORDER — ONDANSETRON 4 MG PO TBDP
4.0000 mg | ORAL_TABLET | Freq: Once | ORAL | Status: AC | PRN
  Administered 2015-01-30: 4 mg via ORAL

## 2015-01-30 MED ORDER — SODIUM CHLORIDE 0.9 % IV SOLN
1000.0000 mL | Freq: Once | INTRAVENOUS | Status: AC
  Administered 2015-01-31: 1000 mL via INTRAVENOUS

## 2015-01-30 MED ORDER — ONDANSETRON HCL 4 MG/2ML IJ SOLN
4.0000 mg | Freq: Once | INTRAMUSCULAR | Status: AC
  Administered 2015-01-31: 4 mg via INTRAVENOUS
  Filled 2015-01-30: qty 2

## 2015-01-30 NOTE — ED Notes (Signed)
C/o generalized abd pain, nausea, and vomiting since this morning.  Denies diarrhea.  Denies urinary complaints.

## 2015-01-30 NOTE — ED Provider Notes (Signed)
CSN: [id number]     Arrival date & time 01/30/15  2059 History   By signing my name below, I, Forrestine Him, attest that this documentation has been prepared under the direction and in the presence of [name], MD.  Electronically Signed: Forrestine Him, ED Scribe. 01/30/2015. 11:41 PM.   Chief Complaint  Patient presents with  . Emesis  . Abdominal Pain   HPI  HPI Comments: [name] is a 50 y.o. female with a PMHx of DM, HTN, and stroke who presents to the Emergency Department complaining of constant, ongoing lower back pain x few days. Pain is described as sharp and rated "pretty bad". Discomfort is exacerbated with movement and position changes. No alleviating factors at this time. Pt also reports ongoing lower abdominal pain, nausea, and 1 episode of vomiting x 1 day. No OTC medications attempted for above symptoms. No recent fever, chills, chest pain, or shortness of breath. Family admits granddaughter has been sick with a stomach bug.  PCP: Thressa Sheller, MD    Past Medical History  Diagnosis Date  . PE (pulmonary embolism)   . Anxiety   . Diabetes mellitus without complication ([place])   . Acid reflux   . Depression   . Adrenal benign tumor     Apparently enlarging and patient "is supposed to have it taken out"  . Hypertension   . Stroke [facility])    Past Surgical History  Procedure Laterality Date  . No past surgeries    . Loop recorder implant N/A 03/25/2014    Procedure: LOOP RECORDER IMPLANT;  Surgeon: [name], MD;  Location: [facility] CATH LAB;  Service: Cardiovascular;  Laterality: N/A;  . Tee without cardioversion N/A 03/25/2014    Procedure: TRANSESOPHAGEAL ECHOCARDIOGRAM (TEE);  Surgeon: [name], MD;  Location: [facility] ENDOSCOPY;  Service: Cardiovascular;  Laterality: N/A;   Family History  Problem Relation Age of Onset  . Heart attack Father 80   Social History  Substance Use Topics  . Smoking status: Current Every Day Smoker -- 1.00 packs/day for 40  years    Types: Cigarettes  . Smokeless tobacco: Never Used  . Alcohol Use: No   OB History    No data available     Review of Systems  Constitutional: Negative for fever and chills.  Respiratory: Negative for cough and shortness of breath.   Cardiovascular: Negative for chest pain.  Gastrointestinal: Positive for nausea, vomiting and abdominal pain. Negative for diarrhea.  Musculoskeletal: Positive for back pain.  Neurological: Negative for headaches.  Psychiatric/Behavioral: Negative for confusion.  All other systems reviewed and are negative.   Allergies  Amoxicillin; Aspirin; Codeine; and Penicillins  Home Medications   Prior to Admission medications   Medication Sig Start Date End Date Taking? Authorizing Provider  ALPRAZolam [name]) 1 MG tablet Take 1 mg by mouth 2 (two) times daily as needed for anxiety.   Yes Historical Provider, MD  clopidogrel (PLAVIX) 75 MG tablet Take 1 tablet (75 mg total) by mouth daily. 07/31/13  Yes [name], MD  dicyclomine (BENTYL) 20 MG tablet Take 1 tablet (20 mg total) by mouth 2 (two) times daily. 05/25/14  Yes [name], PA-C  fenofibrate 160 MG tablet Take 1 tablet (160 mg total) by mouth daily. 07/31/13  Yes [name], MD  lisinopril (PRINIVIL,ZESTRIL) 20 MG tablet Take 20 mg by mouth daily.   Yes Historical Provider, MD  metFORMIN (GLUCOPHAGE-XR) 500 MG 24 hr tablet Take 1 tablet (500 mg total) by  mouth daily with breakfast. Patient taking differently: Take 500 mg by mouth 4 (four) times daily.  10/24/12  Yes [name], PA-C  PARoxetine (PAXIL) 40 MG tablet Take 40 mg by mouth every morning.   Yes Historical Provider, MD  pravastatin (PRAVACHOL) 10 MG tablet Take 1 tablet by mouth daily. 03/08/14  Yes Historical Provider, MD   Triage Vitals: BP 99/70 mmHg  Pulse 107  Temp(Src) 98.2 F (36.8 C) (Oral)  Resp 18  Ht 5\' 6"  (1.676 m)  Wt 240 lb (108.863 kg)  BMI 38.76 kg/m2  SpO2 98%  LMP 01/23/2015   Physical Exam   Constitutional: She is oriented to person, place, and time. She appears well-developed and well-nourished. No distress.  HENT:  Head: Normocephalic and atraumatic.  Eyes: EOM are normal. Pupils are equal, round, and reactive to light.  Neck: Normal range of motion. Neck supple. No JVD present.  Cardiovascular: Normal rate, regular rhythm and normal heart sounds.   No murmur heard. Pulmonary/Chest: Effort normal and breath sounds normal. She has no wheezes. She has no rales. She exhibits no tenderness.  Abdominal: Soft. She exhibits no distension and no mass. There is tenderness.  Mild tenderness diffusely  Bowel sounds decreased  Musculoskeletal: Normal range of motion. She exhibits no edema.  Straight leg positive at 45 degrees bilaterally   Lymphadenopathy:    She has no cervical adenopathy.  Neurological: She is alert and oriented to person, place, and time. No cranial nerve deficit. She exhibits normal muscle tone. Coordination normal.  Skin: Skin is warm and dry. No rash noted.  Psychiatric: She has a normal mood and affect. Her behavior is normal. Judgment and thought content normal.  Nursing note and vitals reviewed.   ED Course  Procedures (including critical care time)  DIAGNOSTIC STUDIES: Oxygen Saturation is 98% on RA, Normal by my interpretation.    COORDINATION OF CARE: 11:35 PM- Will give fluids, Zofran, and pain medication. Will order Lipase, CMP, CBC, and urinalysis. Discussed treatment plan with pt at bedside and pt agreed to plan.     Labs Review Results for orders placed or performed during the hospital encounter of 01/30/15  Lipase, blood  Result Value Ref Range   Lipase 28 11 - 51 U/L  Comprehensive metabolic panel  Result Value Ref Range   Sodium 138 135 - 145 mmol/L   Potassium 4.1 3.5 - 5.1 mmol/L   Chloride 108 101 - 111 mmol/L   CO2 20 (L) 22 - 32 mmol/L   Glucose, Bld 111 (H) 65 - 99 mg/dL   BUN 18 6 - 20 mg/dL   Creatinine, Ser 1.74 (H) 0.44 -  1.00 mg/dL   Calcium 9.0 8.9 - 10.3 mg/dL   Total Protein 6.8 6.5 - 8.1 g/dL   Albumin 3.9 3.5 - 5.0 g/dL   AST 23 15 - 41 U/L   ALT 27 14 - 54 U/L   Alkaline Phosphatase 37 (L) 38 - 126 U/L   Total Bilirubin 0.5 0.3 - 1.2 mg/dL   GFR calc non Af Amer 33 (L) >60 mL/min   GFR calc Af Amer 38 (L) >60 mL/min   Anion gap 10 5 - 15  CBC  Result Value Ref Range   WBC 11.3 (H) 4.0 - 10.5 K/uL   RBC 4.62 3.87 - 5.11 MIL/uL   Hemoglobin 14.8 12.0 - 15.0 g/dL   HCT 44.9 36.0 - 46.0 %   MCV 97.2 78.0 - 100.0 fL   MCH 32.0  26.0 - 34.0 pg   MCHC 33.0 30.0 - 36.0 g/dL   RDW 14.5 11.5 - 15.5 %   Platelets 335 150 - 400 K/uL  Urinalysis, Routine w reflex microscopic (not at [facility])  Result Value Ref Range   Color, Urine YELLOW YELLOW   APPearance CLEAR CLEAR   Specific Gravity, Urine 1.009 1.005 - 1.030   pH 5.5 5.0 - 8.0   Glucose, UA NEGATIVE NEGATIVE mg/dL   Hgb urine dipstick NEGATIVE NEGATIVE   Bilirubin Urine NEGATIVE NEGATIVE   Ketones, ur NEGATIVE NEGATIVE mg/dL   Protein, ur NEGATIVE NEGATIVE mg/dL   Nitrite NEGATIVE NEGATIVE   Leukocytes, UA NEGATIVE NEGATIVE   I have personally reviewed and evaluated these lab results as part of my medical decision-making.  MDM   Final diagnoses:  Nausea and vomiting, vomiting of unspecified type  Abdominal pain, unspecified abdominal location  Bilateral low back pain without sciatica  Renal insufficiency    Low back pain which appears musculoskeletal. No red flags to suggest more serious problems. Nausea and vomiting which is of uncertain cause but probably viral illness. Old records are reviewed and she has ED visits for both of these problems in the past. She's given IV fluids, ondansetron with good relief of nausea. She was given a dose of morphine and still complains of persistent low back pain. At this point, do not see indications for imaging of the back. Laboratory workup does show increase in creatinine compared with last value and  she is referred back to PCP for follow-up of this. She is discharged with prescriptions for ondansetron and oxycodone-acetaminophen. Advised not to use NSAIDs.  I personally performed the services described in this documentation, which was scribed in my presence. The recorded information has been reviewed and is accurate.         [name], MD [phone]

## 2015-01-31 LAB — URINALYSIS, ROUTINE W REFLEX MICROSCOPIC
Bilirubin Urine: NEGATIVE
GLUCOSE, UA: NEGATIVE mg/dL
HGB URINE DIPSTICK: NEGATIVE
KETONES UR: NEGATIVE mg/dL
Leukocytes, UA: NEGATIVE
Nitrite: NEGATIVE
PH: 5.5 (ref 5.0–8.0)
PROTEIN: NEGATIVE mg/dL
Specific Gravity, Urine: 1.009 (ref 1.005–1.030)

## 2015-01-31 MED ORDER — ONDANSETRON HCL 4 MG PO TABS: 4.0000 mg | ORAL_TABLET | Freq: Four times a day (QID) | ORAL | Status: DC | PRN

## 2015-01-31 MED ORDER — OXYCODONE-ACETAMINOPHEN 5-325 MG PO TABS
1.0000 | ORAL_TABLET | Freq: Once | ORAL | Status: AC
  Administered 2015-01-31: 1 via ORAL
  Filled 2015-01-31: qty 1

## 2015-01-31 MED ORDER — OXYCODONE-ACETAMINOPHEN 5-325 MG PO TABS: 1.0000 | ORAL_TABLET | ORAL | Status: DC | PRN

## 2015-01-31 NOTE — Discharge Instructions (Signed)
Your creatinine (blood tests of your kidney) is higher today than it was the last time it was checked last year. Please follow up with your primary care provider in one-2 weeks to have that rechecked. Do not take ibuprofen or other nonsteroidal anti-inflammatory agents because they can make your kidney function worse.   Nausea and Vomiting Nausea is a sick feeling that often comes before throwing up (vomiting). Vomiting is a reflex where stomach contents come out of your mouth. Vomiting can cause severe loss of body fluids (dehydration). Children and elderly adults can become dehydrated quickly, especially if they also have diarrhea. Nausea and vomiting are symptoms of a condition or disease. It is important to find the cause of your symptoms. CAUSES   Direct irritation of the stomach lining. This irritation can result from increased acid production (gastroesophageal reflux disease), infection, food poisoning, taking certain medicines (such as nonsteroidal anti-inflammatory drugs), alcohol use, or tobacco use.  Signals from the brain.These signals could be caused by a headache, heat exposure, an inner ear disturbance, increased pressure in the brain from injury, infection, a tumor, or a concussion, pain, emotional stimulus, or metabolic problems.  An obstruction in the gastrointestinal tract (bowel obstruction).  Illnesses such as diabetes, hepatitis, gallbladder problems, appendicitis, kidney problems, cancer, sepsis, atypical symptoms of a heart attack, or eating disorders.  Medical treatments such as chemotherapy and radiation.  Receiving medicine that makes you sleep (general anesthetic) during surgery. DIAGNOSIS Your caregiver may ask for tests to be done if the problems do not improve after a few days. Tests may also be done if symptoms are severe or if the reason for the nausea and vomiting is not clear. Tests may include:  Urine tests.  Blood tests.  Stool tests.  Cultures (to  look for evidence of infection).  X-rays or other imaging studies. Test results can help your caregiver make decisions about treatment or the need for additional tests. TREATMENT You need to stay well hydrated. Drink frequently but in small amounts.You may wish to drink water, sports drinks, clear broth, or eat frozen ice pops or gelatin dessert to help stay hydrated.When you eat, eating slowly may help prevent nausea.There are also some antinausea medicines that may help prevent nausea. HOME CARE INSTRUCTIONS   Take all medicine as directed by your caregiver.  If you do not have an appetite, do not force yourself to eat. However, you must continue to drink fluids.  If you have an appetite, eat a normal diet unless your caregiver tells you differently.  Eat a variety of complex carbohydrates (rice, wheat, potatoes, bread), lean meats, yogurt, fruits, and vegetables.  Avoid high-fat foods because they are more difficult to digest.  Drink enough water and fluids to keep your urine clear or pale yellow.  If you are dehydrated, ask your caregiver for specific rehydration instructions. Signs of dehydration may include:  Severe thirst.  Dry lips and mouth.  Dizziness.  Dark urine.  Decreasing urine frequency and amount.  Confusion.  Rapid breathing or pulse. SEEK IMMEDIATE MEDICAL CARE IF:   You have blood or brown flecks (like coffee grounds) in your vomit.  You have black or bloody stools.  You have a severe headache or stiff neck.  You are confused.  You have severe abdominal pain.  You have chest pain or trouble breathing.  You do not urinate at least once every 8 hours.  You develop cold or clammy skin.  You continue to vomit for longer than 24 to  48 hours.  You have a fever. MAKE SURE YOU:   Understand these instructions.  Will watch your condition.  Will get help right away if you are not doing well or get worse.   This information is not intended  to replace advice given to you by your health care provider. Make sure you discuss any questions you have with your health care provider.   Document Released: 12/25/2004 Document Revised: 03/19/2011 Document Reviewed: 05/24/2010 Elsevier Interactive Patient Education 2016 [employer] Inc[employer]  Back Pain, Adult Back pain is very common in adults.The cause of back pain is rarely dangerous and the pain often gets better over time.The cause of your back pain may not be known. Some common causes of back pain include:  Strain of the muscles or ligaments supporting the spine.  Wear and tear (degeneration) of the spinal disks.  Arthritis.  Direct injury to the back. For many people, back pain may return. Since back pain is rarely dangerous, most people can learn to manage this condition on their own. HOME CARE INSTRUCTIONS Watch your back pain for any changes. The following actions may help to lessen any discomfort you are feeling:  Remain active. It is stressful on your back to sit or stand in one place for long periods of time. Do not sit, drive, or stand in one place for more than 30 minutes at a time. Take short walks on even surfaces as soon as you are able.Try to increase the length of time you walk each day.  Exercise regularly as directed by your health care provider. Exercise helps your back heal faster. It also helps avoid future injury by keeping your muscles strong and flexible.  Do not stay in bed.Resting more than 1-2 days can delay your recovery.  Pay attention to your body when you bend and lift. The most comfortable positions are those that put less stress on your recovering back. Always use proper lifting techniques, including:  Bending your knees.  Keeping the load close to your body.  Avoiding twisting.  Find a comfortable position to sleep. Use a firm mattress and lie on your side with your knees slightly bent. If you lie on your back, put a pillow under your  knees.  Avoid feeling anxious or stressed.Stress increases muscle tension and can worsen back pain.It is important to recognize when you are anxious or stressed and learn ways to manage it, such as with exercise.  Take medicines only as directed by your health care provider. Over-the-counter medicines to reduce pain and inflammation are often the most helpful.Your health care provider may prescribe muscle relaxant drugs.These medicines help dull your pain so you can more quickly return to your normal activities and healthy exercise.  Apply ice to the injured area:  Put ice in a plastic bag.  Place a towel between your skin and the bag.  Leave the ice on for 20 minutes, 2-3 times a day for the first 2-3 days. After that, ice and heat may be alternated to reduce pain and spasms.  Maintain a healthy weight. Excess weight puts extra stress on your back and makes it difficult to maintain good posture. SEEK MEDICAL CARE IF:  You have pain that is not relieved with rest or medicine.  You have increasing pain going down into the legs or buttocks.  You have pain that does not improve in one week.  You have night pain.  You lose weight.  You have a fever or chills. [place]  IF:   You develop new bowel or bladder control problems.  You have unusual weakness or numbness in your arms or legs.  You develop nausea or vomiting.  You develop abdominal pain.  You feel faint.   This information is not intended to replace advice given to you by your health care provider. Make sure you discuss any questions you have with your health care provider.   Document Released: 12/25/2004 Document Revised: 01/15/2014 Document Reviewed: 04/28/2013 Elsevier Interactive Patient Education 2016 [employer] Inc.  Ondansetron tablets What is this medicine? ONDANSETRON (on DAN se tron) is used to treat nausea and vomiting caused by chemotherapy. It is also used to prevent or treat nausea  and vomiting after surgery. This medicine may be used for other purposes; ask your health care provider or pharmacist if you have questions. What should I tell my health care provider before I take this medicine? They need to know if you have any of these conditions: -heart disease -history of irregular heartbeat -liver disease -low levels of magnesium or potassium in the blood -an unusual or allergic reaction to ondansetron, granisetron, other medicines, foods, dyes, or preservatives -pregnant or trying to get pregnant -breast-feeding How should I use this medicine? Take this medicine by mouth with a glass of water. Follow the directions on your prescription label. Take your doses at regular intervals. Do not take your medicine more often than directed. Talk to your pediatrician regarding the use of this medicine in children. Special care may be needed. Overdosage: If you think you have taken too much of this medicine contact a poison control center or emergency room at once. NOTE: This medicine is only for you. Do not share this medicine with others. What if I miss a dose? If you miss a dose, take it as soon as you can. If it is almost time for your next dose, take only that dose. Do not take double or extra doses. What may interact with this medicine? Do not take this medicine with any of the following medications: -apomorphine -certain medicines for fungal infections like fluconazole, itraconazole, ketoconazole, posaconazole, voriconazole -cisapride -dofetilide -dronedarone -pimozide -thioridazine -ziprasidone This medicine may also interact with the following medications: -carbamazepine -certain medicines for depression, anxiety, or psychotic disturbances -fentanyl -linezolid -MAOIs like Carbex, Eldepryl, Marplan, Nardil, and Parnate -methylene blue (injected into a vein) -other medicines that prolong the QT interval (cause an abnormal heart  rhythm) -phenytoin -rifampicin -tramadol This list may not describe all possible interactions. Give your health care provider a list of all the medicines, herbs, non-prescription drugs, or dietary supplements you use. Also tell them if you smoke, drink alcohol, or use illegal drugs. Some items may interact with your medicine. What should I watch for while using this medicine? Check with your doctor or health care professional right away if you have any sign of an allergic reaction. What side effects may I notice from receiving this medicine? Side effects that you should report to your doctor or health care professional as soon as possible: -allergic reactions like skin rash, itching or hives, swelling of the face, lips or tongue -breathing problems -confusion -dizziness -fast or irregular heartbeat -feeling faint or lightheaded, falls -fever and chills -loss of balance or coordination -seizures -sweating -swelling of the hands or feet -tightness in the chest -tremors -unusually weak or tired Side effects that usually do not require medical attention (report to your doctor or health care professional if they continue or are bothersome): -constipation or diarrhea -headache This  list may not describe all possible side effects. Call your doctor for medical advice about side effects. You may report side effects to FDA at 1-800-FDA-1088. Where should I keep my medicine? Keep out of the reach of children. Store between 2 and 30 degrees C (36 and 86 degrees F). Throw away any unused medicine after the expiration date. NOTE: This sheet is a summary. It may not cover all possible information. If you have questions about this medicine, talk to your doctor, pharmacist, or health care provider.    2016, Elsevier/Gold Standard. (2012-10-01 16:27:45)  Acetaminophen; Oxycodone tablets What is this medicine? ACETAMINOPHEN; OXYCODONE (a set a MEE noe fen; ox i KOE done) is a pain reliever. It is  used to treat moderate to severe pain. This medicine may be used for other purposes; ask your health care provider or pharmacist if you have questions. What should I tell my health care provider before I take this medicine? They need to know if you have any of these conditions: -brain tumor -Crohn's disease, inflammatory bowel disease, or ulcerative colitis -drug abuse or addiction -head injury -heart or circulation problems -if you often drink alcohol -kidney disease or problems going to the bathroom -liver disease -lung disease, asthma, or breathing problems -an unusual or allergic reaction to acetaminophen, oxycodone, other opioid analgesics, other medicines, foods, dyes, or preservatives -pregnant or trying to get pregnant -breast-feeding How should I use this medicine? Take this medicine by mouth with a full glass of water. Follow the directions on the prescription label. You can take it with or without food. If it upsets your stomach, take it with food. Take your medicine at regular intervals. Do not take it more often than directed. Talk to your pediatrician regarding the use of this medicine in children. Special care may be needed. Patients over 58 years old may have a stronger reaction and need a smaller dose. Overdosage: If you think you have taken too much of this medicine contact a poison control center or emergency room at once. NOTE: This medicine is only for you. Do not share this medicine with others. What if I miss a dose? If you miss a dose, take it as soon as you can. If it is almost time for your next dose, take only that dose. Do not take double or extra doses. What may interact with this medicine? -alcohol -antihistamines -barbiturates like amobarbital, butalbital, butabarbital, methohexital, pentobarbital, phenobarbital, thiopental, and secobarbital -benztropine -drugs for bladder problems like solifenacin, trospium, oxybutynin, tolterodine, hyoscyamine, and  methscopolamine -drugs for breathing problems like ipratropium and tiotropium -drugs for certain stomach or intestine problems like propantheline, homatropine methylbromide, glycopyrrolate, atropine, belladonna, and dicyclomine -general anesthetics like etomidate, ketamine, nitrous oxide, propofol, desflurane, enflurane, halothane, isoflurane, and sevoflurane -medicines for depression, anxiety, or psychotic disturbances -medicines for sleep -muscle relaxants -naltrexone -narcotic medicines (opiates) for pain -phenothiazines like perphenazine, thioridazine, chlorpromazine, mesoridazine, fluphenazine, prochlorperazine, promazine, and trifluoperazine -scopolamine -tramadol -trihexyphenidyl This list may not describe all possible interactions. Give your health care provider a list of all the medicines, herbs, non-prescription drugs, or dietary supplements you use. Also tell them if you smoke, drink alcohol, or use illegal drugs. Some items may interact with your medicine. What should I watch for while using this medicine? Tell your doctor or health care professional if your pain does not go away, if it gets worse, or if you have new or a different type of pain. You may develop tolerance to the medicine. Tolerance means that you will need  a higher dose of the medication for pain relief. Tolerance is normal and is expected if you take this medicine for a long time. Do not suddenly stop taking your medicine because you may develop a severe reaction. Your body becomes used to the medicine. This does NOT mean you are addicted. Addiction is a behavior related to getting and using a drug for a non-medical reason. If you have pain, you have a medical reason to take pain medicine. Your doctor will tell you how much medicine to take. If your doctor wants you to stop the medicine, the dose will be slowly lowered over time to avoid any side effects. You may get drowsy or dizzy. Do not drive, use machinery, or do  anything that needs mental alertness until you know how this medicine affects you. Do not stand or sit up quickly, especially if you are an older patient. This reduces the risk of dizzy or fainting spells. Alcohol may interfere with the effect of this medicine. Avoid alcoholic drinks. There are different types of narcotic medicines (opiates) for pain. If you take more than one type at the same time, you may have more side effects. Give your health care provider a list of all medicines you use. Your doctor will tell you how much medicine to take. Do not take more medicine than directed. Call emergency for help if you have problems breathing. The medicine will cause constipation. Try to have a bowel movement at least every 2 to 3 days. If you do not have a bowel movement for 3 days, call your doctor or health care professional. Do not take Tylenol (acetaminophen) or medicines that have acetaminophen with this medicine. Too much acetaminophen can be very dangerous. Many nonprescription medicines contain acetaminophen. Always read the labels carefully to avoid taking more acetaminophen. What side effects may I notice from receiving this medicine? Side effects that you should report to your doctor or health care professional as soon as possible: -allergic reactions like skin rash, itching or hives, swelling of the face, lips, or tongue -breathing difficulties, wheezing -confusion -light headedness or fainting spells -severe stomach pain -unusually weak or tired -yellowing of the skin or the whites of the eyes Side effects that usually do not require medical attention (report to your doctor or health care professional if they continue or are bothersome): -dizziness -drowsiness -nausea -vomiting This list may not describe all possible side effects. Call your doctor for medical advice about side effects. You may report side effects to FDA at 1-800-FDA-1088. Where should I keep my medicine? Keep out of  the reach of children. This medicine can be abused. Keep your medicine in a safe place to protect it from theft. Do not share this medicine with anyone. Selling or giving away this medicine is dangerous and against the law. This medicine may cause accidental overdose and death if it taken by other adults, children, or pets. Mix any unused medicine with a substance like cat litter or coffee grounds. Then throw the medicine away in a sealed container like a sealed bag or a coffee can with a lid. Do not use the medicine after the expiration date. Store at room temperature between 20 and 25 degrees C (68 and 77 degrees F). NOTE: This sheet is a summary. It may not cover all possible information. If you have questions about this medicine, talk to your doctor, pharmacist, or health care provider.    2016, Elsevier/Gold Standard. (2013-11-25 15:18:46)

## 2015-02-05 LAB — CUP PACEART REMOTE DEVICE CHECK: Date Time Interrogation Session: 20161212170748

## 2015-02-18 ENCOUNTER — Ambulatory Visit (INDEPENDENT_AMBULATORY_CARE_PROVIDER_SITE_OTHER): Payer: BLUE CROSS/BLUE SHIELD | Admitting: *Deleted

## 2015-02-18 DIAGNOSIS — I6930 Unspecified sequelae of cerebral infarction: Secondary | ICD-10-CM

## 2015-02-18 DIAGNOSIS — Z8673 Personal history of transient ischemic attack (TIA), and cerebral infarction without residual deficits: Secondary | ICD-10-CM | POA: Diagnosis not present

## 2015-02-18 NOTE — Progress Notes (Signed)
Carelink Summary Report / Loop Recorder 

## 2015-03-14 LAB — CUP PACEART REMOTE DEVICE CHECK: MDC IDC SESS DTM: 20170111173847

## 2015-03-14 NOTE — Progress Notes (Signed)
[facility] summary report received. Battery status OK. Normal device function. No new symptom episodes, tachy episodes, brady, or pause episodes. No new AF episodes. Histograms right shifted. Monthly summary reports and ROV/PRN 

## 2015-03-16 LAB — CUP PACEART REMOTE DEVICE CHECK: MDC IDC SESS DTM: 20170210180756

## 2015-03-16 NOTE — Progress Notes (Signed)
[facility] summary report received. Battery status OK. Normal device function. No new symptom episodes, tachy episodes, brady, or pause episodes. No new AF episodes. Monthly summary reports and ROV/PRN 

## 2015-03-21 ENCOUNTER — Ambulatory Visit (INDEPENDENT_AMBULATORY_CARE_PROVIDER_SITE_OTHER): Payer: BLUE CROSS/BLUE SHIELD | Admitting: *Deleted

## 2015-03-21 DIAGNOSIS — I6930 Unspecified sequelae of cerebral infarction: Secondary | ICD-10-CM

## 2015-03-21 DIAGNOSIS — Z8673 Personal history of transient ischemic attack (TIA), and cerebral infarction without residual deficits: Secondary | ICD-10-CM

## 2015-03-21 NOTE — Progress Notes (Signed)
Carelink Summary Report / Loop Recorder 

## 2015-04-07 ENCOUNTER — Emergency Department (HOSPITAL_BASED_OUTPATIENT_CLINIC_OR_DEPARTMENT_OTHER)
Admission: EM | Admit: 2015-04-07 | Discharge: 2015-04-07 | Disposition: A | Discharge status: Home / Self Care | Payer: BLUE CROSS/BLUE SHIELD | Attending: Emergency Medicine | Admitting: Emergency Medicine

## 2015-04-07 ENCOUNTER — Emergency Department (HOSPITAL_BASED_OUTPATIENT_CLINIC_OR_DEPARTMENT_OTHER): Payer: BLUE CROSS/BLUE SHIELD

## 2015-04-07 ENCOUNTER — Encounter (HOSPITAL_BASED_OUTPATIENT_CLINIC_OR_DEPARTMENT_OTHER): Payer: Self-pay

## 2015-04-07 DIAGNOSIS — K219 Gastro-esophageal reflux disease without esophagitis: Secondary | ICD-10-CM | POA: Insufficient documentation

## 2015-04-07 DIAGNOSIS — M542 Cervicalgia: Secondary | ICD-10-CM | POA: Insufficient documentation

## 2015-04-07 DIAGNOSIS — Z79899 Other long term (current) drug therapy: Secondary | ICD-10-CM | POA: Insufficient documentation

## 2015-04-07 DIAGNOSIS — R42 Dizziness and giddiness: Secondary | ICD-10-CM | POA: Diagnosis not present

## 2015-04-07 DIAGNOSIS — Z880 Allergy status to penicillin: Secondary | ICD-10-CM | POA: Diagnosis not present

## 2015-04-07 DIAGNOSIS — F419 Anxiety disorder, unspecified: Secondary | ICD-10-CM | POA: Diagnosis not present

## 2015-04-07 DIAGNOSIS — Z86711 Personal history of pulmonary embolism: Secondary | ICD-10-CM | POA: Insufficient documentation

## 2015-04-07 DIAGNOSIS — J3489 Other specified disorders of nose and nasal sinuses: Secondary | ICD-10-CM | POA: Insufficient documentation

## 2015-04-07 DIAGNOSIS — J029 Acute pharyngitis, unspecified: Secondary | ICD-10-CM | POA: Insufficient documentation

## 2015-04-07 DIAGNOSIS — R000 Tachycardia, unspecified: Secondary | ICD-10-CM | POA: Insufficient documentation

## 2015-04-07 DIAGNOSIS — Z7984 Long term (current) use of oral hypoglycemic drugs: Secondary | ICD-10-CM | POA: Diagnosis not present

## 2015-04-07 DIAGNOSIS — Z7902 Long term (current) use of antithrombotics/antiplatelets: Secondary | ICD-10-CM | POA: Diagnosis not present

## 2015-04-07 DIAGNOSIS — R61 Generalized hyperhidrosis: Secondary | ICD-10-CM | POA: Insufficient documentation

## 2015-04-07 DIAGNOSIS — R05 Cough: Secondary | ICD-10-CM | POA: Insufficient documentation

## 2015-04-07 DIAGNOSIS — R002 Palpitations: Secondary | ICD-10-CM | POA: Diagnosis present

## 2015-04-07 DIAGNOSIS — R059 Cough, unspecified: Secondary | ICD-10-CM

## 2015-04-07 DIAGNOSIS — R110 Nausea: Secondary | ICD-10-CM | POA: Insufficient documentation

## 2015-04-07 DIAGNOSIS — I10 Essential (primary) hypertension: Secondary | ICD-10-CM | POA: Diagnosis not present

## 2015-04-07 DIAGNOSIS — F329 Major depressive disorder, single episode, unspecified: Secondary | ICD-10-CM | POA: Diagnosis not present

## 2015-04-07 DIAGNOSIS — E119 Type 2 diabetes mellitus without complications: Secondary | ICD-10-CM | POA: Insufficient documentation

## 2015-04-07 DIAGNOSIS — F17210 Nicotine dependence, cigarettes, uncomplicated: Secondary | ICD-10-CM | POA: Diagnosis not present

## 2015-04-07 DIAGNOSIS — Z86018 Personal history of other benign neoplasm: Secondary | ICD-10-CM | POA: Diagnosis not present

## 2015-04-07 DIAGNOSIS — Z8673 Personal history of transient ischemic attack (TIA), and cerebral infarction without residual deficits: Secondary | ICD-10-CM | POA: Diagnosis not present

## 2015-04-07 LAB — URINE MICROSCOPIC-ADD ON

## 2015-04-07 LAB — TROPONIN I: Troponin I: <0.03 ng/mL (ref <0.031)

## 2015-04-07 LAB — CBC WITH DIFFERENTIAL/PLATELET
BASOS PCT: 0 %
Basophils Absolute: 0.1 10*3/uL (ref 0.0–0.1)
Eosinophils Absolute: 0.3 10*3/uL (ref 0.0–0.7)
Eosinophils Relative: 3 %
HEMATOCRIT: 47.7 % — AB (ref 36.0–46.0)
Hemoglobin: 16.1 g/dL — ABNORMAL HIGH (ref 12.0–15.0)
LYMPHS ABS: 4.1 10*3/uL — AB (ref 0.7–4.0)
Lymphocytes Relative: 37 %
MCH: 31.9 pg (ref 26.0–34.0)
MCHC: 33.8 g/dL (ref 30.0–36.0)
MCV: 94.5 fL (ref 78.0–100.0)
MONO ABS: 0.8 10*3/uL (ref 0.1–1.0)
Monocytes Relative: 7 %
NEUTROS ABS: 6 10*3/uL (ref 1.7–7.7)
Neutrophils Relative %: 53 %
Platelets: 292 10*3/uL (ref 150–400)
RBC: 5.05 MIL/uL (ref 3.87–5.11)
RDW: 13.9 % (ref 11.5–15.5)
WBC: 11.2 10*3/uL — ABNORMAL HIGH (ref 4.0–10.5)

## 2015-04-07 LAB — BASIC METABOLIC PANEL
Anion gap: 9 (ref 5–15)
BUN: 21 mg/dL — AB (ref 6–20)
CALCIUM: 9.5 mg/dL (ref 8.9–10.3)
CHLORIDE: 104 mmol/L (ref 101–111)
CO2: 24 mmol/L (ref 22–32)
CREATININE: 1.43 mg/dL — AB (ref 0.44–1.00)
GFR calc Af Amer: 48 mL/min — ABNORMAL LOW (ref >60)
GFR calc non Af Amer: 42 mL/min — ABNORMAL LOW (ref >60)
GLUCOSE: 178 mg/dL — AB (ref 65–99)
Potassium: 3.7 mmol/L (ref 3.5–5.1)
Sodium: 137 mmol/L (ref 135–145)

## 2015-04-07 LAB — URINALYSIS, ROUTINE W REFLEX MICROSCOPIC
BILIRUBIN URINE: NEGATIVE
Glucose, UA: >1000 mg/dL — AB
HGB URINE DIPSTICK: NEGATIVE
Ketones, ur: NEGATIVE mg/dL
Leukocytes, UA: NEGATIVE
Nitrite: NEGATIVE
PH: 5.5 (ref 5.0–8.0)
Protein, ur: NEGATIVE mg/dL
SPECIFIC GRAVITY, URINE: 1.005 (ref 1.005–1.030)

## 2015-04-07 MED ORDER — ONDANSETRON 8 MG PO TBDP
8.0000 mg | ORAL_TABLET | Freq: Once | ORAL | Status: AC
  Administered 2015-04-07: 8 mg via ORAL
  Filled 2015-04-07: qty 1

## 2015-04-07 MED ORDER — HYDROCODONE-ACETAMINOPHEN 5-325 MG PO TABS
1.0000 | ORAL_TABLET | Freq: Once | ORAL | Status: AC
  Administered 2015-04-07: 1 via ORAL
  Filled 2015-04-07: qty 1

## 2015-04-07 NOTE — Discharge Instructions (Signed)
It was our pleasure to provide your ER care today - we hope that you feel better.  Rest. Drink plenty of fluids.  Follow up with your cardiologist tomorrow as relates current report on your loop recorder - call office tomorrow morning to discuss and arrange close follow up with them.  For cough/congestion, you may try mucinex or nyquil as need for symptom relief.  Avoid any smoking.   Your symptoms may be the result of a viral cold or flu-like illness which should run its course and get better in the next few days.  No pneumonia was noted on your xray.   Follow up with primary care doctor for recheck in the next couple days.  A couple of your lab tests sent today remain pending (thyroid tests) - have your doctor follow up on those results in the next few days.  Also from today's labs, your blood sugar is mildly high (178) - drink adequate water, continue diabetes medication and diet, and follow up closely with your doctor.   Return to ER if worse, new symptoms, fevers, trouble breathing, chest pain, weak/fainting, persistent fast heart beat, other concern.    Palpitations A palpitation is the feeling that your heartbeat is irregular or is faster than normal. It may feel like your heart is fluttering or skipping a beat. Palpitations are usually not a serious problem. However, in some cases, you may need further medical evaluation. CAUSES  Palpitations can be caused by:  Smoking.  Caffeine or other stimulants, such as diet pills or energy drinks.  Alcohol.  Stress and anxiety.  Strenuous physical activity.  Fatigue.  Certain medicines.  Heart disease, especially if you have a history of irregular heart rhythms (arrhythmias), such as atrial fibrillation, atrial flutter, or supraventricular tachycardia.  An improperly working pacemaker or defibrillator. DIAGNOSIS  To find the cause of your palpitations, your health care provider will take your medical history and perform a  physical exam. Your health care provider may also have you take a test called an ambulatory electrocardiogram (ECG). An ECG records your heartbeat patterns over a 24-hour period. You may also have other tests, such as:  Transthoracic echocardiogram (TTE). During echocardiography, sound waves are used to evaluate how blood flows through your heart.  Transesophageal echocardiogram (TEE).  Cardiac monitoring. This allows your health care provider to monitor your heart rate and rhythm in real time.  Holter monitor. This is a portable device that records your heartbeat and can help diagnose heart arrhythmias. It allows your health care provider to track your heart activity for several days, if needed.  Stress tests by exercise or by giving medicine that makes the heart beat faster. TREATMENT  Treatment of palpitations depends on the cause of your symptoms and can vary greatly. Most cases of palpitations do not require any treatment other than time, relaxation, and monitoring your symptoms. Other causes, such as atrial fibrillation, atrial flutter, or supraventricular tachycardia, usually require further treatment. HOME CARE INSTRUCTIONS   Avoid:  Caffeinated coffee, tea, soft drinks, diet pills, and energy drinks.  Chocolate.  Alcohol.  Stop smoking if you smoke.  Reduce your stress and anxiety. Things that can help you relax include:  A method of controlling things in your body, such as your heartbeats, with your mind (biofeedback).  Yoga.  Meditation.  Physical activity such as swimming, jogging, or walking.  Get plenty of rest and sleep. SEEK MEDICAL CARE IF:   You continue to have a fast or irregular heartbeat beyond  24 hours.  Your palpitations occur more often. SEEK IMMEDIATE MEDICAL CARE IF:  You have chest pain or shortness of breath.  You have a severe headache.  You feel dizzy or you faint. MAKE SURE YOU:  Understand these instructions.  Will watch your  condition.  Will get help right away if you are not doing well or get worse.   This information is not intended to replace advice given to you by your health care provider. Make sure you discuss any questions you have with your health care provider.   Document Released: 12/23/1999 Document Revised: 12/30/2012 Document Reviewed: 02/23/2011 Elsevier Interactive Patient Education 2016 Else[employer] Inc[employer]    Cough, Adult Coughing is a reflex that clears your throat and your airways. Coughing helps to heal and protect your lungs. It is normal to cough occasionally, but a cough that happens with other symptoms or lasts a long time may be a sign of a condition that needs treatment. A cough may last only 2-3 weeks (acute), or it may last longer than 8 weeks (chronic). CAUSES Coughing is commonly caused by:  Breathing in substances that irritate your lungs.  A viral or bacterial respiratory infection.  Allergies.  Asthma.  Postnasal drip.  Smoking.  Acid backing up from the stomach into the esophagus (gastroesophageal reflux).  Certain medicines.  Chronic lung problems, including COPD (or rarely, lung cancer).  Other medical conditions such as heart failure. HOME CARE INSTRUCTIONS  Pay attention to any changes in your symptoms. Take these actions to help with your discomfort:  Take medicines only as told by your health care provider.  If you were prescribed an antibiotic medicine, take it as told by your health care provider. Do not stop taking the antibiotic even if you start to feel better.  Talk with your health care provider before you take a cough suppressant medicine.  Drink enough fluid to keep your urine clear or pale yellow.  If the air is dry, use a cold steam vaporizer or humidifier in your bedroom or your home to help loosen secretions.  Avoid anything that causes you to cough at work or at home.  If your cough is worse at night, try sleeping in a semi-upright  position.  Avoid cigarette smoke. If you smoke, quit smoking. If you need help quitting, ask your health care provider.  Avoid caffeine.  Avoid alcohol.  Rest as needed. SEEK MEDICAL CARE IF:   You have new symptoms.  You cough up pus.  Your cough does not get better after 2-3 weeks, or your cough gets worse.  You cannot control your cough with suppressant medicines and you are losing sleep.  You develop pain that is getting worse or pain that is not controlled with pain medicines.  You have a fever.  You have unexplained weight loss.  You have night sweats. SEEK IMMEDIATE MEDICAL CARE IF:  You cough up blood.  You have difficulty breathing.  Your heartbeat is very fast.   This information is not intended to replace advice given to you by your health care provider. Make sure you discuss any questions you have with your health care provider.   Document Released: 06/23/2010 Document Revised: 09/15/2014 Document Reviewed: 03/03/2014 Elsevier Interactive Patient Education 2016 [employer].    Hyperglycemia Hyperglycemia occurs when the glucose (sugar) in your blood is too high. Hyperglycemia can happen for many reasons, but it most often happens to people who do not know they have diabetes or are not managing their  diabetes properly.  CAUSES  Whether you have diabetes or not, there are other causes of hyperglycemia. Hyperglycemia can occur when you have diabetes, but it can also occur in other situations that you might not be as aware of, such as: Diabetes  If you have diabetes and are having problems controlling your blood glucose, hyperglycemia could occur because of some of the following reasons:  Not following your meal plan.  Not taking your diabetes medications or not taking it properly.  Exercising less or doing less activity than you normally do.  Being sick. Pre-diabetes  This cannot be ignored. Before people develop Type 2 diabetes, they almost always  have "pre-diabetes." This is when your blood glucose levels are higher than normal, but not yet high enough to be diagnosed as diabetes. Research has shown that some long-term damage to the body, especially the heart and circulatory system, may already be occurring during pre-diabetes. If you take action to manage your blood glucose when you have pre-diabetes, you may delay or prevent Type 2 diabetes from developing. Stress  If you have diabetes, you may be "diet" controlled or on oral medications or insulin to control your diabetes. However, you may find that your blood glucose is higher than usual in the hospital whether you have diabetes or not. This is often referred to as "stress hyperglycemia." Stress can elevate your blood glucose. This happens because of hormones put out by the body during times of stress. If stress has been the cause of your high blood glucose, it can be followed regularly by your caregiver. That way he/she can make sure your hyperglycemia does not continue to get worse or progress to diabetes. Steroids  Steroids are medications that act on the infection fighting system (immune system) to block inflammation or infection. One side effect can be a rise in blood glucose. Most people can produce enough extra insulin to allow for this rise, but for those who cannot, steroids make blood glucose levels go even higher. It is not unusual for steroid treatments to "uncover" diabetes that is developing. It is not always possible to determine if the hyperglycemia will go away after the steroids are stopped. A special blood test called an A1c is sometimes done to determine if your blood glucose was elevated before the steroids were started. SYMPTOMS  Thirsty.  Frequent urination.  Dry mouth.  Blurred vision.  Tired or fatigue.  Weakness.  Sleepy.  Tingling in feet or leg. DIAGNOSIS  Diagnosis is made by monitoring blood glucose in one or all of the following ways:  A1c test.  This is a chemical found in your blood.  Fingerstick blood glucose monitoring.  Laboratory results. TREATMENT  First, knowing the cause of the hyperglycemia is important before the hyperglycemia can be treated. Treatment may include, but is not be limited to:  Education.  Change or adjustment in medications.  Change or adjustment in meal plan.  Treatment for an illness, infection, etc.  More frequent blood glucose monitoring.  Change in exercise plan.  Decreasing or stopping steroids.  Lifestyle changes. HOME CARE INSTRUCTIONS   Test your blood glucose as directed.  Exercise regularly. Your caregiver will give you instructions about exercise. Pre-diabetes or diabetes which comes on with stress is helped by exercising.  Eat wholesome, balanced meals. Eat often and at regular, fixed times. Your caregiver or nutritionist will give you a meal plan to guide your sugar intake.  Being at an ideal weight is important. If needed, losing as little  as 10 to 15 pounds may help improve blood glucose levels. SEEK MEDICAL CARE IF:   You have questions about medicine, activity, or diet.  You continue to have symptoms (problems such as increased thirst, urination, or weight gain). SEEK IMMEDIATE MEDICAL CARE IF:   You are vomiting or have diarrhea.  Your breath smells fruity.  You are breathing faster or slower.  You are very sleepy or incoherent.  You have numbness, tingling, or pain in your feet or hands.  You have chest pain.  Your symptoms get worse even though you have been following your caregiver's orders.  If you have any other questions or concerns.   This information is not intended to replace advice given to you by your health care provider. Make sure you discuss any questions you have with your health care provider.   Document Released: 06/20/2000 Document Revised: 03/19/2011 Document Reviewed: 08/31/2014 Elsevier Interactive Patient Education 2016 Elsevier  Inc.     Upper Respiratory Infection, Adult Most upper respiratory infections (URIs) are a viral infection of the air passages leading to the lungs. A URI affects the nose, throat, and upper air passages. The most common type of URI is nasopharyngitis and is typically referred to as "the common cold." URIs run their course and usually go away on their own. Most of the time, a URI does not require medical attention, but sometimes a bacterial infection in the upper airways can follow a viral infection. This is called a secondary infection. Sinus and middle ear infections are common types of secondary upper respiratory infections. Bacterial pneumonia can also complicate a URI. A URI can worsen asthma and chronic obstructive pulmonary disease (COPD). Sometimes, these complications can require emergency medical care and may be life threatening.  CAUSES Almost all URIs are caused by viruses. A virus is a type of germ and can spread from one person to another.  RISKS FACTORS You may be at risk for a URI if:   You smoke.   You have chronic heart or lung disease.  You have a weakened defense (immune) system.   You are very young or very old.   You have nasal allergies or asthma.  You work in crowded or poorly ventilated areas.  You work in health care facilities or schools. SIGNS AND SYMPTOMS  Symptoms typically develop 2-3 days after you come in contact with a cold virus. Most viral URIs last 7-10 days. However, viral URIs from the influenza virus (flu virus) can last 14-18 days and are typically more severe. Symptoms may include:   Runny or stuffy (congested) nose.   Sneezing.   Cough.   Sore throat.   Headache.   Fatigue.   Fever.   Loss of appetite.   Pain in your forehead, behind your eyes, and over your cheekbones (sinus pain).  Muscle aches.  DIAGNOSIS  Your health care provider may diagnose a URI by:  Physical exam.  Tests to check that your symptoms  are not due to another condition such as:  Strep throat.  Sinusitis.  Pneumonia.  Asthma. TREATMENT  A URI goes away on its own with time. It cannot be cured with medicines, but medicines may be prescribed or recommended to relieve symptoms. Medicines may help:  Reduce your fever.  Reduce your cough.  Relieve nasal congestion. HOME CARE INSTRUCTIONS   Take medicines only as directed by your health care provider.   Gargle warm saltwater or take cough drops to comfort your throat as directed by your  health care provider.  Use a warm mist humidifier or inhale steam from a shower to increase air moisture. This may make it easier to breathe.  Drink enough fluid to keep your urine clear or pale yellow.   Eat soups and other clear broths and maintain good nutrition.   Rest as needed.   Return to work when your temperature has returned to normal or as your health care provider advises. You may need to stay home longer to avoid infecting others. You can also use a face mask and careful hand washing to prevent spread of the virus.  Increase the usage of your inhaler if you have asthma.   Do not use any tobacco products, including cigarettes, chewing tobacco, or electronic cigarettes. If you need help quitting, ask your health care provider. PREVENTION  The best way to protect yourself from getting a cold is to practice good hygiene.   Avoid oral or hand contact with people with cold symptoms.   Wash your hands often if contact occurs.  There is no clear evidence that vitamin C, vitamin E, echinacea, or exercise reduces the chance of developing a cold. However, it is always recommended to get plenty of rest, exercise, and practice good nutrition.  SEEK MEDICAL CARE IF:   You are getting worse rather than better.   Your symptoms are not controlled by medicine.   You have chills.  You have worsening shortness of breath.  You have brown or red mucus.  You have yellow  or brown nasal discharge.  You have pain in your face, especially when you bend forward.  You have a fever.  You have swollen neck glands.  You have pain while swallowing.  You have white areas in the back of your throat. SEEK IMMEDIATE MEDICAL CARE IF:   You have severe or persistent:  Headache.  Ear pain.  Sinus pain.  Chest pain.  You have chronic lung disease and any of the following:  Wheezing.  Prolonged cough.  Coughing up blood.  A change in your usual mucus.  You have a stiff neck.  You have changes in your:  Vision.  Hearing.  Thinking.  Mood. MAKE SURE YOU:   Understand these instructions.  Will watch your condition.  Will get help right away if you are not doing well or get worse.   This information is not intended to replace advice given to you by your health care provider. Make sure you discuss any questions you have with your health care provider.   Document Released: 06/20/2000 Document Revised: 05/11/2014 Document Reviewed: 04/01/2013 Elsevier Interactive Patient Education 2016 Elsevier Inc.   Cough, Adult Coughing is a reflex that clears your throat and your airways. Coughing helps to heal and protect your lungs. It is normal to cough occasionally, but a cough that happens with other symptoms or lasts a long time may be a sign of a condition that needs treatment. A cough may last only 2-3 weeks (acute), or it may last longer than 8 weeks (chronic). CAUSES Coughing is commonly caused by:  Breathing in substances that irritate your lungs.  A viral or bacterial respiratory infection.  Allergies.  Asthma.  Postnasal drip.  Smoking.  Acid backing up from the stomach into the esophagus (gastroesophageal reflux).  Certain medicines.  Chronic lung problems, including COPD (or rarely, lung cancer).  Other medical conditions such as heart failure. HOME CARE INSTRUCTIONS  Pay attention to any changes in your symptoms. Take  these actions to help  with your discomfort:  Take medicines only as told by your health care provider.  If you were prescribed an antibiotic medicine, take it as told by your health care provider. Do not stop taking the antibiotic even if you start to feel better.  Talk with your health care provider before you take a cough suppressant medicine.  Drink enough fluid to keep your urine clear or pale yellow.  If the air is dry, use a cold steam vaporizer or humidifier in your bedroom or your home to help loosen secretions.  Avoid anything that causes you to cough at work or at home.  If your cough is worse at night, try sleeping in a semi-upright position.  Avoid cigarette smoke. If you smoke, quit smoking. If you need help quitting, ask your health care provider.  Avoid caffeine.  Avoid alcohol.  Rest as needed. SEEK MEDICAL CARE IF:   You have new symptoms.  You cough up pus.  Your cough does not get better after 2-3 weeks, or your cough gets worse.  You cannot control your cough with suppressant medicines and you are losing sleep.  You develop pain that is getting worse or pain that is not controlled with pain medicines.  You have a fever.  You have unexplained weight loss.  You have night sweats. SEEK IMMEDIATE MEDICAL CARE IF:  You cough up blood.  You have difficulty breathing.  Your heartbeat is very fast.   This information is not intended to replace advice given to you by your health care provider. Make sure you discuss any questions you have with your health care provider.   Document Released: 06/23/2010 Document Revised: 09/15/2014 Document Reviewed: 03/03/2014 Elsevier Interactive Patient Education 2016 [employer].    Smoking Hazards Smoking cigarettes is extremely bad for your health. Tobacco smoke has over 200 known poisons in it. It contains the poisonous gases nitrogen oxide and carbon monoxide. There are over 60 chemicals in tobacco smoke that  cause cancer. Some of the chemicals found in cigarette smoke include:   Cyanide.   Benzene.   Formaldehyde.   Methanol (wood alcohol).   Acetylene (fuel used in welding torches).   Ammonia.  Even smoking lightly shortens your life expectancy by several years. You can greatly reduce the risk of medical problems for you and your family by stopping now. Smoking is the most preventable cause of death and disease in our society. Within days of quitting smoking, your circulation improves, you decrease the risk of having a heart attack, and your lung capacity improves. There may be some increased phlegm in the first few days after quitting, and it may take months for your lungs to clear up completely. Quitting for 10 years reduces your risk of developing lung cancer to almost that of a nonsmoker.  WHAT ARE THE RISKS OF SMOKING? Cigarette smokers have an increased risk of many serious medical problems, including:  Lung cancer.   Lung disease (such as pneumonia, bronchitis, and emphysema).   Heart attack and chest pain due to the heart not getting enough oxygen (angina).   Heart disease and peripheral blood vessel disease.   Hypertension.   Stroke.   Oral cancer (cancer of the lip, mouth, or voice box).   Bladder cancer.   Pancreatic cancer.   Cervical cancer.   Pregnancy complications, including premature birth.   Stillbirths and smaller newborn babies, birth defects, and genetic damage to sperm.   Early menopause.   Lower estrogen level for women.  Infertility.   Facial wrinkles.   Blindness.   Increased risk of broken bones (fractures).   Senile dementia.   Stomach ulcers and internal bleeding.   Delayed wound healing and increased risk of complications during surgery. Because of secondhand smoke exposure, children of smokers have an increased risk of the following:   Sudden infant death syndrome (SIDS).   Respiratory infections.    Lung cancer.   Heart disease.   Ear infections.  WHY IS SMOKING ADDICTIVE? Nicotine is the chemical agent in tobacco that is capable of causing addiction or dependence. When you smoke and inhale, nicotine is absorbed rapidly into the bloodstream through your lungs. Both inhaled and noninhaled nicotine may be addictive.  WHAT ARE THE BENEFITS OF QUITTING?  There are many health benefits to quitting smoking. Some are:   The likelihood of developing cancer and heart disease decreases. Health improvements are seen almost immediately.   Blood pressure, pulse rate, and breathing patterns start returning to normal soon after quitting.   People who quit may see an improvement in their overall quality of life.  HOW DO YOU QUIT SMOKING? Smoking is an addiction with both physical and psychological effects, and longtime habits can be hard to change. Your health care provider can recommend:  Programs and community resources, which may include group support, education, or therapy.  Replacement products, such as patches, gum, and nasal sprays. Use these products only as directed. Do not replace cigarette smoking with electronic cigarettes (commonly called e-cigarettes). The safety of e-cigarettes is unknown, and some may contain harmful chemicals. FOR MORE INFORMATION  American Lung Association: www.lung.org  American Cancer Society: www.cancer.org   This information is not intended to replace advice given to you by your health care provider. Make sure you discuss any questions you have with your health care provider.   Document Released: 02/02/2004 Document Revised: 10/15/2012 Document Reviewed: 06/16/2012 Elsevier Interactive Patient Education [employer].

## 2015-04-07 NOTE — ED Notes (Signed)
C/o irregular hear rate sweaty and dizziness x 2-3 days

## 2015-04-07 NOTE — ED Notes (Signed)
Pt now states has had ha x 2-3 days

## 2015-04-07 NOTE — ED Provider Notes (Addendum)
CSN: [id number]     Arrival date & time 04/07/15  2056 History  By signing my name below, I, Jolayne Panther, attest that this documentation has been prepared under the direction and in the presence of Lajean Saver, MD. Electronically Signed: Jolayne Panther, Scribe. 04/07/2015. 10:10 PM.  Chief Complaint  Patient presents with  . Palpitations   The history is provided by the patient. No language interpreter was used.   HPI Comments: [name] is a 50 y.o. female, with a PMHx of PE, DM, and stroke, who presents to the Emergency Department complaining of sudden onset of fairly constant heart palpitations, which began 2-3 days ago and usually last several hours at a time. Pt also reports that her LNMP was two months ago and because of this, suspects recent onset of menopause, which she thinks may be causing her symptoms. Pt also reports associated dizziness, nausea, HA, sweating in her fingertips, sore throat, rhinorrhea, ear pain, recent cough, and recent neck pain with radiation into her bilateral shoulder muscles for the past two weeks. Her PCP, Dr. Alyson Ingles, stated that her neck and shoulder pain could possibly be muscle spasms, but pt notes that nothing he prescribes her has relieved her pain. Pt also reports that Dr. Alyson Ingles recently diagnosed her with bronchitis, and she states that she is till suffering from a cough secondary to her bronchitis. Pt denies LOC during her episodes of heart palpitations and states that she was not exerting herself at their onset. She still has a loop recorder in the left side of her chest, which is still monitored. No rhythm issues have been found thus far, but pt is supposed to report for another scan soon. Pt notes that the dosage of one of her medications has recently been increased and one of her medications has recently been changed, but she is unable to recall what medications these were. She also mentions multiple spider bites on her lower, RLE,  which she recently noticed. Pt denies vomiting, diarrhea, dysuria, abdominal pain, recent blood loss, recent heavy caffeine use, and hx of AFIB and HTN.    PCP: Trilby Drummer, MD. Cardiologist: Pt is unable to recall.   Past Medical History  Diagnosis Date  . PE (pulmonary embolism)   . Anxiety   . Diabetes mellitus without complication ([place])   . Acid reflux   . Depression   . Adrenal benign tumor     Apparently enlarging and patient "is supposed to have it taken out"  . Hypertension   . Stroke [facility])    Past Surgical History  Procedure Laterality Date  . No past surgeries    . Loop recorder implant N/A 03/25/2014    Procedure: LOOP RECORDER IMPLANT;  Surgeon: [name], MD;  Location: [facility] CATH LAB;  Service: Cardiovascular;  Laterality: N/A;  . Tee without cardioversion N/A 03/25/2014    Procedure: TRANSESOPHAGEAL ECHOCARDIOGRAM (TEE);  Surgeon: [name], MD;  Location: [facility] ENDOSCOPY;  Service: Cardiovascular;  Laterality: N/A;   Family History  Problem Relation Age of Onset  . Heart attack Father 58   Social History  Substance Use Topics  . Smoking status: Current Every Day Smoker -- 1.00 packs/day for 40 years    Types: Cigarettes  . Smokeless tobacco: Never Used  . Alcohol Use: No   OB History    No data available     Review of Systems  Constitutional: Positive for diaphoresis.  HENT: Positive for rhinorrhea and sore throat.  Eyes: Negative for visual disturbance.  Respiratory: Positive for cough.   Cardiovascular: Positive for palpitations.  Gastrointestinal: Positive for nausea. Negative for vomiting, abdominal pain and diarrhea.  Endocrine: Negative for polyuria.  Genitourinary: Negative for dysuria, vaginal bleeding and pelvic pain.  Musculoskeletal: Positive for arthralgias ( bilateral shoulders ) and neck pain.  Skin: Negative for rash.  Neurological: Positive for dizziness and headaches. Negative for speech difficulty, weakness and numbness.   Hematological: Negative for adenopathy.  Psychiatric/Behavioral: Negative for confusion.  All other systems reviewed and are negative.  Allergies  Amoxicillin; Aspirin; Codeine; and Penicillins  Home Medications   Prior to Admission medications   Medication Sig Start Date End Date Taking? Authorizing Provider  ALPRAZolam [name]) 1 MG tablet Take 1 mg by mouth 2 (two) times daily as needed for anxiety.    Historical Provider, MD  clopidogrel (PLAVIX) 75 MG tablet Take 1 tablet (75 mg total) by mouth daily. 07/31/13   [name], MD  dicyclomine (BENTYL) 20 MG tablet Take 1 tablet (20 mg total) by mouth 2 (two) times daily. 05/25/14   [name], PA-C  fenofibrate 160 MG tablet Take 1 tablet (160 mg total) by mouth daily. 07/31/13   [name], MD  lisinopril (PRINIVIL,ZESTRIL) 20 MG tablet Take 20 mg by mouth daily.    Historical Provider, MD  metFORMIN (GLUCOPHAGE-XR) 500 MG 24 hr tablet Take 1 tablet (500 mg total) by mouth daily with breakfast. Patient taking differently: Take 500 mg by mouth 4 (four) times daily.  10/24/12   Fransico Meadow, PA-C  ondansetron (ZOFRAN) 4 MG tablet Take 1 tablet (4 mg total) by mouth every 6 (six) hours as needed for nausea or vomiting. 123XX123   [name], MD  oxyCODONE-acetaminophen (PERCOCET) 5-325 MG tablet Take 1 tablet by mouth every 4 (four) hours as needed for moderate pain. 123XX123   [name], MD  PARoxetine (PAXIL) 40 MG tablet Take 40 mg by mouth every morning.    Historical Provider, MD  pravastatin (PRAVACHOL) 10 MG tablet Take 1 tablet by mouth daily. 03/08/14   Historical Provider, MD   There were no vitals taken for this visit. Physical Exam  Constitutional: She is oriented to person, place, and time. She appears well-developed and well-nourished. No distress.  HENT:  Head: Normocephalic and atraumatic.  Nose: Nose normal.  Mouth/Throat: Oropharynx is clear and moist.  Eyes: Conjunctivae are normal. Pupils are equal, round, and  reactive to light. No scleral icterus.  Neck: Neck supple. No tracheal deviation present. No thyromegaly present.  No stiffness or rigidity  Cardiovascular: Regular rhythm, normal heart sounds and intact distal pulses.  Exam reveals no gallop and no friction rub.   No murmur heard. Mildly tachycardic   Pulmonary/Chest: Effort normal and breath sounds normal. No respiratory distress. She has no wheezes.  Abdominal: Soft. Bowel sounds are normal. She exhibits no distension and no mass. There is no tenderness. There is no rebound and no guarding.  Genitourinary:  No cva tenderness  Musculoskeletal: She exhibits no edema or tenderness.  CTLS spine, non tender, aligned, no step off. bil mild trapezius/upper back muscular tenderness. No sts.   Neurological: She is alert and oriented to person, place, and time.  Skin: Skin is warm and dry. No rash noted. She is not diaphoretic.  Psychiatric: She has a normal mood and affect.  Nursing note and vitals reviewed.  ED Course  Procedures  DIAGNOSTIC STUDIES:    Oxygen Saturation is 96% on RA, adequate  by my interpretation.   COORDINATION OF CARE:  9:43 PM Will review EKG and lab work. Will order chest x-ray. Discussed treatment plan with pt at bedside and pt agreed to plan.   Labs Review  Results for orders placed or performed during the hospital encounter of 04/07/15  CBC with Differential  Result Value Ref Range   WBC 11.2 (H) 4.0 - 10.5 K/uL   RBC 5.05 3.87 - 5.11 MIL/uL   Hemoglobin 16.1 (H) 12.0 - 15.0 g/dL   HCT 47.7 (H) 36.0 - 46.0 %   MCV 94.5 78.0 - 100.0 fL   MCH 31.9 26.0 - 34.0 pg   MCHC 33.8 30.0 - 36.0 g/dL   RDW 13.9 11.5 - 15.5 %   Platelets 292 150 - 400 K/uL   Neutrophils Relative % 53 %   Neutro Abs 6.0 1.7 - 7.7 K/uL   Lymphocytes Relative 37 %   Lymphs Abs 4.1 (H) 0.7 - 4.0 K/uL   Monocytes Relative 7 %   Monocytes Absolute 0.8 0.1 - 1.0 K/uL   Eosinophils Relative 3 %   Eosinophils Absolute 0.3 0.0 - 0.7  K/uL   Basophils Relative 0 %   Basophils Absolute 0.1 0.0 - 0.1 K/uL  Basic metabolic panel  Result Value Ref Range   Sodium 137 135 - 145 mmol/L   Potassium 3.7 3.5 - 5.1 mmol/L   Chloride 104 101 - 111 mmol/L   CO2 24 22 - 32 mmol/L   Glucose, Bld 178 (H) 65 - 99 mg/dL   BUN 21 (H) 6 - 20 mg/dL   Creatinine, Ser 1.43 (H) 0.44 - 1.00 mg/dL   Calcium 9.5 8.9 - 10.3 mg/dL   GFR calc non Af Amer 42 (L) >60 mL/min   GFR calc Af Amer 48 (L) >60 mL/min   Anion gap 9 5 - 15  Troponin I  Result Value Ref Range   Troponin I <0.03 <0.031 ng/mL  Urinalysis, Routine w reflex microscopic (not at [facility])  Result Value Ref Range   Color, Urine YELLOW YELLOW   APPearance CLEAR CLEAR   Specific Gravity, Urine 1.005 1.005 - 1.030   pH 5.5 5.0 - 8.0   Glucose, UA >1000 (A) NEGATIVE mg/dL   Hgb urine dipstick NEGATIVE NEGATIVE   Bilirubin Urine NEGATIVE NEGATIVE   Ketones, ur NEGATIVE NEGATIVE mg/dL   Protein, ur NEGATIVE NEGATIVE mg/dL   Nitrite NEGATIVE NEGATIVE   Leukocytes, UA NEGATIVE NEGATIVE  Urine microscopic-add on  Result Value Ref Range   Squamous Epithelial / LPF 0-5 (A) NONE SEEN   WBC, UA 0-5 0 - 5 WBC/hpf   RBC / HPF 0-5 0 - 5 RBC/hpf   Bacteria, UA FEW (A) NONE SEEN   Dg Chest 2 View  04/07/2015  CLINICAL DATA:  Initial evaluation for acute cough. EXAM: CHEST  2 VIEW COMPARISON:  Prior study from 04/29/2014. FINDINGS: The cardiac and mediastinal silhouettes are stable in size and contour, and remain within normal limits. Loop recorder overlies the left heart, stable. The lungs are normally inflated. No airspace consolidation, pleural effusion, or pulmonary edema is identified. There is no pneumothorax. No acute osseous abnormality identified. IMPRESSION: No active cardiopulmonary disease. Electronically Signed   By: Jeannine Boga M.D.   On: 04/07/2015 22:39      I have personally reviewed and evaluated these images and lab results as part of my medical  decision-making.   EKG Interpretation   Date/Time:  Thursday April 07 2015 21:07:26 EDT Ventricular  Rate:  118 PR Interval:  116 QRS Duration: 61 QT Interval:  442 QTC Calculation: 619 R Axis:   26 Text Interpretation:  Sinus tachycardia Low voltage, extremity and  precordial leads No significant change since last tracing Confirmed by  Ashok Cordia  MD, Lennette Bihari ([id number]) on 04/07/2015 9:13:53 PM     MDM   I personally performed the services described in this documentation, which was scribed in my presence. The recorded information has been reviewed and considered. Lajean Saver, MD  Labs sent. Monitor.  Pt reports having loop recorder - denies specific dysrhythmia, on any recent notification of issue/rhythm problem.  No recent wt loss. No night sweats. No fevers.  Denies head intolerance, wt loss, skin changes, or hx thyroid disease.  No current chest pain or discomfort of any sort.   Labs appear c/w baseline.   Pt indicates has had back pain for long while, has seen pcp for same, told possible muscle spasm, requests med in ED (has ride, does not have to drive). Hydrocodone 1 po.  zofran for nausea.  abd soft nt. Chest cta.  Vitals appears c/w prior/baseline.  No dysrhythmia on monitor.   Pt currently appears stable for d/c. rec close pcp/card f/u.  Return precautions provided.       Lajean Saver, MD 04/07/15 2256

## 2015-04-07 NOTE — ED Notes (Signed)
C/o palpitations, sweaty, light headed x 2-3 days-denies pain-taken to tx area via w/c

## 2015-04-08 LAB — T4, FREE: FREE T4: 0.87 ng/dL (ref 0.61–1.12)

## 2015-04-08 LAB — TSH: TSH: 1.502 u[IU]/mL (ref 0.350–4.500)

## 2015-04-19 ENCOUNTER — Ambulatory Visit (INDEPENDENT_AMBULATORY_CARE_PROVIDER_SITE_OTHER): Payer: BLUE CROSS/BLUE SHIELD | Admitting: *Deleted

## 2015-04-19 DIAGNOSIS — I6930 Unspecified sequelae of cerebral infarction: Secondary | ICD-10-CM

## 2015-04-19 DIAGNOSIS — Z8673 Personal history of transient ischemic attack (TIA), and cerebral infarction without residual deficits: Secondary | ICD-10-CM

## 2015-04-19 NOTE — Progress Notes (Signed)
Carelink Summary Report / Loop Recorder 

## 2015-05-19 ENCOUNTER — Ambulatory Visit (INDEPENDENT_AMBULATORY_CARE_PROVIDER_SITE_OTHER): Payer: BLUE CROSS/BLUE SHIELD | Admitting: *Deleted

## 2015-05-19 DIAGNOSIS — Z8673 Personal history of transient ischemic attack (TIA), and cerebral infarction without residual deficits: Secondary | ICD-10-CM | POA: Diagnosis not present

## 2015-05-19 DIAGNOSIS — I6930 Unspecified sequelae of cerebral infarction: Secondary | ICD-10-CM

## 2015-05-20 NOTE — Progress Notes (Signed)
Carelink Summary Report / Loop Recorder 

## 2015-06-01 LAB — CUP PACEART REMOTE DEVICE CHECK: Date Time Interrogation Session: 20170312183548

## 2015-06-05 LAB — CUP PACEART REMOTE DEVICE CHECK: Date Time Interrogation Session: 20170411190824

## 2015-06-05 NOTE — Progress Notes (Signed)
[facility] summary report received. Battery status OK. Normal device function. No new symptom episodes, tachy episodes, brady, or pause episodes. No new AF episodes. Monthly summary reports and ROV/PRN 

## 2015-06-14 ENCOUNTER — Encounter (HOSPITAL_BASED_OUTPATIENT_CLINIC_OR_DEPARTMENT_OTHER): Payer: Self-pay | Admitting: *Deleted

## 2015-06-14 ENCOUNTER — Emergency Department (HOSPITAL_BASED_OUTPATIENT_CLINIC_OR_DEPARTMENT_OTHER)
Admission: EM | Admit: 2015-06-14 | Discharge: 2015-06-14 | Disposition: A | Discharge status: Home / Self Care | Payer: BLUE CROSS/BLUE SHIELD | Attending: Emergency Medicine | Admitting: Emergency Medicine

## 2015-06-14 DIAGNOSIS — Z8673 Personal history of transient ischemic attack (TIA), and cerebral infarction without residual deficits: Secondary | ICD-10-CM | POA: Diagnosis not present

## 2015-06-14 DIAGNOSIS — E119 Type 2 diabetes mellitus without complications: Secondary | ICD-10-CM | POA: Diagnosis not present

## 2015-06-14 DIAGNOSIS — Z79899 Other long term (current) drug therapy: Secondary | ICD-10-CM | POA: Insufficient documentation

## 2015-06-14 DIAGNOSIS — E1122 Type 2 diabetes mellitus with diabetic chronic kidney disease: Secondary | ICD-10-CM | POA: Diagnosis not present

## 2015-06-14 DIAGNOSIS — Z0000 Encounter for general adult medical examination without abnormal findings: Secondary | ICD-10-CM | POA: Insufficient documentation

## 2015-06-14 DIAGNOSIS — F17210 Nicotine dependence, cigarettes, uncomplicated: Secondary | ICD-10-CM | POA: Insufficient documentation

## 2015-06-14 DIAGNOSIS — R102 Pelvic and perineal pain: Secondary | ICD-10-CM | POA: Diagnosis not present

## 2015-06-14 DIAGNOSIS — Z008 Encounter for other general examination: Secondary | ICD-10-CM

## 2015-06-14 DIAGNOSIS — Z7984 Long term (current) use of oral hypoglycemic drugs: Secondary | ICD-10-CM | POA: Insufficient documentation

## 2015-06-14 DIAGNOSIS — N8110 Cystocele, unspecified: Secondary | ICD-10-CM | POA: Diagnosis present

## 2015-06-14 DIAGNOSIS — F329 Major depressive disorder, single episode, unspecified: Secondary | ICD-10-CM | POA: Insufficient documentation

## 2015-06-14 DIAGNOSIS — Z0001 Encounter for general adult medical examination with abnormal findings: Secondary | ICD-10-CM | POA: Diagnosis not present

## 2015-06-14 DIAGNOSIS — I129 Hypertensive chronic kidney disease with stage 1 through stage 4 chronic kidney disease, or unspecified chronic kidney disease: Secondary | ICD-10-CM | POA: Diagnosis not present

## 2015-06-14 DIAGNOSIS — I10 Essential (primary) hypertension: Secondary | ICD-10-CM | POA: Diagnosis not present

## 2015-06-14 LAB — WET PREP, GENITAL
Clue Cells Wet Prep HPF POC: NONE SEEN
Sperm: NONE SEEN
TRICH WET PREP: NONE SEEN
WBC, Wet Prep HPF POC: NONE SEEN
YEAST WET PREP: NONE SEEN

## 2015-06-14 LAB — URINALYSIS, ROUTINE W REFLEX MICROSCOPIC
Bilirubin Urine: NEGATIVE
HGB URINE DIPSTICK: NEGATIVE
Ketones, ur: NEGATIVE mg/dL
Leukocytes, UA: NEGATIVE
Nitrite: NEGATIVE
PH: 6 (ref 5.0–8.0)
Protein, ur: NEGATIVE mg/dL
SPECIFIC GRAVITY, URINE: 1.008 (ref 1.005–1.030)

## 2015-06-14 LAB — URINE MICROSCOPIC-ADD ON
RBC / HPF: NONE SEEN RBC/hpf (ref 0–5)
WBC, UA: NONE SEEN WBC/hpf (ref 0–5)

## 2015-06-14 NOTE — ED Provider Notes (Signed)
CSN: [id number]     Arrival date & time 06/14/15  0049 History   First MD Initiated Contact with Patient 06/14/15 0118     Chief Complaint  Patient presents with  . Vaginal Prolapse     (Consider location/radiation/quality/duration/timing/severity/associated sxs/prior Treatment) Patient is a 50 y.o. female presenting with general illness. The history is provided by the patient.  Illness Location:  Vagina Quality:  Discomfort Severity:  Mild Onset quality:  Gradual Duration:  5 hours Timing:  Constant Progression:  Unchanged Chronicity:  New Context:  Thinks something is hanging out, no trauma, no bleeding nor discharge Relieved by:  Nothing Worsened by:  None tried Ineffective treatments:  None tried Associated symptoms: no abdominal pain and no fever     Past Medical History  Diagnosis Date  . PE (pulmonary embolism)   . Anxiety   . Diabetes mellitus without complication ([place])   . Acid reflux   . Depression   . Adrenal benign tumor     Apparently enlarging and patient "is supposed to have it taken out"  . Hypertension   . Stroke [facility])    Past Surgical History  Procedure Laterality Date  . No past surgeries    . Loop recorder implant N/A 03/25/2014    Procedure: LOOP RECORDER IMPLANT;  Surgeon: [name], MD;  Location: [facility] CATH LAB;  Service: Cardiovascular;  Laterality: N/A;  . Tee without cardioversion N/A 03/25/2014    Procedure: TRANSESOPHAGEAL ECHOCARDIOGRAM (TEE);  Surgeon: [name], MD;  Location: [facility] ENDOSCOPY;  Service: Cardiovascular;  Laterality: N/A;   Family History  Problem Relation Age of Onset  . Heart attack Father 80   Social History  Substance Use Topics  . Smoking status: Current Every Day Smoker -- 1.00 packs/day for 40 years    Types: Cigarettes  . Smokeless tobacco: Never Used  . Alcohol Use: No   OB History    No data available     Review of Systems  Constitutional: Negative for fever.  Gastrointestinal: Negative for  abdominal pain.  Genitourinary: Positive for vaginal pain. Negative for hematuria, flank pain, vaginal bleeding, vaginal discharge, genital sores and menstrual problem.  All other systems reviewed and are negative.     Allergies  Amoxicillin; Aspirin; Codeine; and Penicillins  Home Medications   Prior to Admission medications   Medication Sig Start Date End Date Taking? Authorizing Provider  ALPRAZolam [name]) 1 MG tablet Take 1 mg by mouth 2 (two) times daily as needed for anxiety.    Historical Provider, MD  clopidogrel (PLAVIX) 75 MG tablet Take 1 tablet (75 mg total) by mouth daily. 07/31/13   [name], MD  dicyclomine (BENTYL) 20 MG tablet Take 1 tablet (20 mg total) by mouth 2 (two) times daily. 05/25/14   [name], PA-C  fenofibrate 160 MG tablet Take 1 tablet (160 mg total) by mouth daily. 07/31/13   [name], MD  lisinopril (PRINIVIL,ZESTRIL) 20 MG tablet Take 20 mg by mouth daily.    Historical Provider, MD  metFORMIN (GLUCOPHAGE-XR) 500 MG 24 hr tablet Take 1 tablet (500 mg total) by mouth daily with breakfast. Patient taking differently: Take 500 mg by mouth 4 (four) times daily.  10/24/12   Fransico Meadow, PA-C  ondansetron (ZOFRAN) 4 MG tablet Take 1 tablet (4 mg total) by mouth every 6 (six) hours as needed for nausea or vomiting. [id number]   [name], MD  PARoxetine (PAXIL) 40 MG tablet Take 40 mg by mouth every  morning.    Historical Provider, MD  pravastatin (PRAVACHOL) 10 MG tablet Take 1 tablet by mouth daily. 03/08/14   Historical Provider, MD   BP 141/81 mmHg  Pulse 88  Temp(Src) 98.7 F (37.1 C) (Oral)  Resp 18  Ht 5\' 6"  (1.676 m)  Wt 250 lb (113.399 kg)  BMI 40.37 kg/m2  SpO2 100% Physical Exam  Constitutional: She is oriented to person, place, and time. She appears well-developed and well-nourished. No distress.  HENT:  Head: Normocephalic and atraumatic.  Mouth/Throat: Oropharynx is clear and moist.  Eyes: Conjunctivae are normal. Pupils are  equal, round, and reactive to light.  Neck: Normal range of motion. Neck supple.  Cardiovascular: Normal rate, regular rhythm and intact distal pulses.   Pulmonary/Chest: Effort normal and breath sounds normal. No respiratory distress. She has no wheezes. She has no rales.  Abdominal: Soft. Bowel sounds are normal. There is no tenderness. There is no rebound and no guarding.  Genitourinary: No labial fusion. There is no rash, tenderness, lesion or injury on the right labia. There is no rash, tenderness, lesion or injury on the left labia. No erythema, tenderness or bleeding in the vagina. No foreign body around the vagina. No signs of injury around the vagina. No vaginal discharge found.  Chaperone present no prolapse  Musculoskeletal: Normal range of motion.  Neurological: She is alert and oriented to person, place, and time.  Skin: Skin is warm and dry.  Psychiatric: She has a normal mood and affect.    ED Course  Procedures (including critical care time) Labs Review Labs Reviewed  URINALYSIS, ROUTINE W REFLEX MICROSCOPIC (NOT AT [facility]) - Abnormal; Notable for the following:    Glucose, UA >1000 (*)    All other components within normal limits  URINE MICROSCOPIC-ADD ON - Abnormal; Notable for the following:    Squamous Epithelial / LPF 0-5 (*)    Bacteria, UA RARE (*)    All other components within normal limits  WET PREP, GENITAL  GC/CHLAMYDIA PROBE AMP () NOT AT [facility]    Imaging Review No results found. I have personally reviewed and evaluated these images and lab results as part of my medical decision-making.   EKG Interpretation None      MDM   Final diagnoses:  Encounter for medical assessment   Filed Vitals:   06/14/15 0056 06/14/15 0227  BP: 141/81   Pulse: 122 88  Temp: 98.7 F (37.1 C)   Resp: 20 18    Results for orders placed or performed during the hospital encounter of 06/14/15  Wet prep, genital  Result Value Ref Range   Yeast Wet Prep  HPF POC NONE SEEN NONE SEEN   Trich, Wet Prep NONE SEEN NONE SEEN   Clue Cells Wet Prep HPF POC NONE SEEN NONE SEEN   WBC, Wet Prep HPF POC NONE SEEN NONE SEEN   Sperm NONE SEEN   Urinalysis, Routine w reflex microscopic (not at [facility])  Result Value Ref Range   Color, Urine YELLOW YELLOW   APPearance CLEAR CLEAR   Specific Gravity, Urine 1.008 1.005 - 1.030   pH 6.0 5.0 - 8.0   Glucose, UA >1000 (A) NEGATIVE mg/dL   Hgb urine dipstick NEGATIVE NEGATIVE   Bilirubin Urine NEGATIVE NEGATIVE   Ketones, ur NEGATIVE NEGATIVE mg/dL   Protein, ur NEGATIVE NEGATIVE mg/dL   Nitrite NEGATIVE NEGATIVE   Leukocytes, UA NEGATIVE NEGATIVE  Urine microscopic-add on  Result Value Ref Range   Squamous Epithelial /  LPF 0-5 (A) NONE SEEN   WBC, UA NONE SEEN 0 - 5 WBC/hpf   RBC / HPF NONE SEEN 0 - 5 RBC/hpf   Bacteria, UA RARE (A) NONE SEEN   No results found.  Well appearing nothing is prolapsed.  Tissue appears healthy.  Follow up with gynecology for ongoing testing and treatment.      Veatrice Kells, MD 06/14/15 [phone]

## 2015-06-14 NOTE — ED Notes (Signed)
Pt. Reports she feels  Like she has something sticking out of her vagina and her husband said he can see something sticking out of her vagina.  Pt. Reports it is painful like something is pulling.

## 2015-06-15 LAB — GC/CHLAMYDIA PROBE AMP (~~LOC~~) NOT AT ARMC
Chlamydia: NEGATIVE
Neisseria Gonorrhea: NEGATIVE

## 2015-06-20 ENCOUNTER — Ambulatory Visit (INDEPENDENT_AMBULATORY_CARE_PROVIDER_SITE_OTHER): Payer: BLUE CROSS/BLUE SHIELD | Admitting: *Deleted

## 2015-06-20 DIAGNOSIS — I129 Hypertensive chronic kidney disease with stage 1 through stage 4 chronic kidney disease, or unspecified chronic kidney disease: Secondary | ICD-10-CM | POA: Diagnosis not present

## 2015-06-20 DIAGNOSIS — Z8673 Personal history of transient ischemic attack (TIA), and cerebral infarction without residual deficits: Secondary | ICD-10-CM

## 2015-06-20 DIAGNOSIS — E1122 Type 2 diabetes mellitus with diabetic chronic kidney disease: Secondary | ICD-10-CM | POA: Diagnosis not present

## 2015-06-20 DIAGNOSIS — F339 Major depressive disorder, recurrent, unspecified: Secondary | ICD-10-CM | POA: Diagnosis not present

## 2015-06-20 DIAGNOSIS — I6930 Unspecified sequelae of cerebral infarction: Secondary | ICD-10-CM

## 2015-06-20 DIAGNOSIS — N183 Chronic kidney disease, stage 3 (moderate): Secondary | ICD-10-CM | POA: Diagnosis not present

## 2015-06-20 NOTE — Progress Notes (Signed)
Carelink Summary Report / Loop Recorder 

## 2015-06-24 LAB — CUP PACEART REMOTE DEVICE CHECK
Date Time Interrogation Session: 20170610200816
MDC IDC SESS DTM: 20170511193622

## 2015-07-15 ENCOUNTER — Encounter: Payer: Self-pay | Admitting: Obstetrics & Gynecology

## 2015-07-15 ENCOUNTER — Ambulatory Visit (INDEPENDENT_AMBULATORY_CARE_PROVIDER_SITE_OTHER): Payer: BLUE CROSS/BLUE SHIELD | Admitting: Obstetrics & Gynecology

## 2015-07-15 VITALS — BP 105/66 | HR 98 | Ht 66.0 in | Wt 219.0 lb

## 2015-07-15 DIAGNOSIS — Z124 Encounter for screening for malignant neoplasm of cervix: Secondary | ICD-10-CM

## 2015-07-15 DIAGNOSIS — Z1231 Encounter for screening mammogram for malignant neoplasm of breast: Secondary | ICD-10-CM

## 2015-07-15 DIAGNOSIS — Z01419 Encounter for gynecological examination (general) (routine) without abnormal findings: Secondary | ICD-10-CM

## 2015-07-15 DIAGNOSIS — Z1151 Encounter for screening for human papillomavirus (HPV): Secondary | ICD-10-CM

## 2015-07-15 DIAGNOSIS — N912 Amenorrhea, unspecified: Secondary | ICD-10-CM | POA: Diagnosis not present

## 2015-07-15 DIAGNOSIS — N812 Incomplete uterovaginal prolapse: Secondary | ICD-10-CM

## 2015-07-15 DIAGNOSIS — N951 Menopausal and female climacteric states: Secondary | ICD-10-CM

## 2015-07-15 DIAGNOSIS — N393 Stress incontinence (female) (male): Secondary | ICD-10-CM | POA: Diagnosis not present

## 2015-07-15 MED ORDER — VENLAFAXINE HCL ER 75 MG PO CP24: 75.0000 mg | ORAL_CAPSULE | Freq: Every day | ORAL | Status: DC

## 2015-07-15 NOTE — Progress Notes (Signed)
Patient ID: [name], female   DOB: 07/12/1965, 50 y.o.   MRN: [id number] Subjective:     [name] is a 50 y.o. female here for a routine exam.  Current complaints: Pt reports taht she thinks that she has a 'prolapsed uterus'  Last exam >10 years.  Pt was seen in the ED and told that she probably had a prolapse.  Pt reports LMP 01/2015.  Pt reports hot flashes.  Pt reports decreaed energy and limited sex drive. Pt reports pressure in the pelvic area. Pt also reports pain in her lower back.     Pt is s/p CVA and a DVT and PE.  Pt smokes 1ppd x 40+ years.   Gynecologic History No LMP recorded. Patient is not currently having periods (Reason: Perimenopausal). Contraception: post menopausal status Last Pap: unknown.  Results were: unknown Last mammogram: never had one.  Obstetric History OB History  Gravida Para Term Preterm AB SAB TAB Ectopic Multiple Living  2 2 2  0 0 0 0 0 0 2    # Outcome Date GA Lbr Len/2nd Weight Sex Delivery Anes PTL Lv  2 Term           1 Term              The following portions of the patient's history were reviewed and updated as appropriate: allergies, current medications, past family history, past medical history, past social history, past surgical history and problem list.  Review of Systems Pertinent items are noted in HPI.    Objective:  BP 105/66 mmHg  Pulse 98  Ht 5\' 6"  (1.676 m)  Wt 219 lb (99.338 kg)  BMI 35.36 kg/m2  General Appearance:    Alert, cooperative, no distress, appears stated age  Head:    Normocephalic, without obvious abnormality, atraumatic  Eyes:    conjunctiva/corneas clear, EOM's intact, both eyes  Ears:    Normal external ear canals, both ears  Nose:   Nares normal, septum midline, mucosa normal, no drainage    or sinus tenderness  Throat:   Lips, mucosa, and tongue normal; teeth and gums normal  Neck:   Supple, symmetrical, trachea midline, no adenopathy;    thyroid:  no enlargement/tenderness/nodules  Back:      Symmetric, no curvature, ROM normal, no CVA tenderness  Lungs:     Clear to auscultation bilaterally, respirations unlabored  Chest Wall:    No tenderness or deformity   Heart:    Regular rate and rhythm, S1 and S2 normal, no murmur, rub   or gallop  Breast Exam:    No tenderness, masses, or nipple abnormality  Abdomen:     Soft, non-tender, bowel sounds active all four quadrants,    no masses, no organomegaly  Genitalia:    Normal female without lesion, discharge or tenderness Vagina: no blood in vault Cervix: no lesion; no mucopurulent d/c Uterus: small, mobile; prolapses to introitus with standing and valsalva Adnexa: no masses; non tender  Incontinence not reproduced.     Extremities:   Extremities normal, atraumatic, no cyanosis or edema  Pulses:   2+ and symmetric all extremities  Skin:   Skin color, texture, turgor normal, no rashes or lesions     Assessment:  See below    Plan:   1. Visit for screening mammogram - MM Digital Screening; Future  2. Encounter for gynecological examination with Papanicolaou smear of cervix - Cytology - PAP  3. Amenorrhea Suspect menopausal state - Follicle  stimulating hormone  4. Incomplete uterine prolapse Refer to Dr. Maryland Pink  5. Stress incontinence, female Refer to Dr. [place] Pink  6. Menopausal hot flushes - venlafaxine XR (EFFEXOR XR) 75 MG 24 hr capsule; Take 1 capsule (75 mg total) by mouth daily with breakfast.  Dispense: 30 capsule; Refill: 3  F/u in 3 months   Yader Criger L. Harraway-Smith, M.D., Cherlynn June

## 2015-07-16 LAB — FOLLICLE STIMULATING HORMONE: FSH: 7.3 m[IU]/mL

## 2015-07-18 ENCOUNTER — Ambulatory Visit (INDEPENDENT_AMBULATORY_CARE_PROVIDER_SITE_OTHER): Payer: BLUE CROSS/BLUE SHIELD | Admitting: *Deleted

## 2015-07-18 DIAGNOSIS — Z8673 Personal history of transient ischemic attack (TIA), and cerebral infarction without residual deficits: Secondary | ICD-10-CM | POA: Diagnosis not present

## 2015-07-18 DIAGNOSIS — I6930 Unspecified sequelae of cerebral infarction: Secondary | ICD-10-CM

## 2015-07-18 LAB — CYTOLOGY - PAP

## 2015-07-19 ENCOUNTER — Ambulatory Visit
Admission: RE | Admit: 2015-07-19 | Discharge: 2015-07-19 | Disposition: A | Discharge status: Home / Self Care | Payer: BLUE CROSS/BLUE SHIELD | Source: Ambulatory Visit | Attending: Obstetrics & Gynecology | Admitting: Obstetrics & Gynecology

## 2015-07-19 DIAGNOSIS — Z1231 Encounter for screening mammogram for malignant neoplasm of breast: Secondary | ICD-10-CM | POA: Diagnosis not present

## 2015-07-19 NOTE — Progress Notes (Signed)
Carelink Summary Report / Loop Recorder 

## 2015-07-20 ENCOUNTER — Telehealth: Payer: Self-pay | Admitting: General Practice

## 2015-07-20 NOTE — Telephone Encounter (Signed)
Per Dr Ihor Dow, patient needs to be referred to Dr. Maryland Pink Uro gyn. For eval and management of uterine prolase with incontinence. Called and made appt for 8/23 @ 1:45pm. Patient will be mailed an information packet she needs to complete & it will contain address & office info. Called patient, no answer- left message to call us back in regards to an appt.

## 2015-08-10 ENCOUNTER — Encounter: Payer: Self-pay | Admitting: *Deleted

## 2015-08-10 NOTE — Telephone Encounter (Signed)
Attempted to call patient, there was no answer. Message left stating I am calling to see whether she had received information about am appointment that had been set up for her. Please give [place] a call at the clinic and let us know. Will send letter.

## 2015-08-17 ENCOUNTER — Ambulatory Visit (INDEPENDENT_AMBULATORY_CARE_PROVIDER_SITE_OTHER): Payer: BLUE CROSS/BLUE SHIELD | Admitting: *Deleted

## 2015-08-17 DIAGNOSIS — I6930 Unspecified sequelae of cerebral infarction: Secondary | ICD-10-CM

## 2015-08-17 DIAGNOSIS — Z8673 Personal history of transient ischemic attack (TIA), and cerebral infarction without residual deficits: Secondary | ICD-10-CM | POA: Diagnosis not present

## 2015-08-18 LAB — CUP PACEART REMOTE DEVICE CHECK: Date Time Interrogation Session: 20170710210923

## 2015-08-18 NOTE — Progress Notes (Signed)
Carelink Summary Report / Loop Recorder 

## 2015-08-23 LAB — CUP PACEART REMOTE DEVICE CHECK: MDC IDC SESS DTM: 20170809213835

## 2015-09-16 ENCOUNTER — Ambulatory Visit (INDEPENDENT_AMBULATORY_CARE_PROVIDER_SITE_OTHER): Payer: BLUE CROSS/BLUE SHIELD | Admitting: *Deleted

## 2015-09-16 DIAGNOSIS — Z8673 Personal history of transient ischemic attack (TIA), and cerebral infarction without residual deficits: Secondary | ICD-10-CM | POA: Diagnosis not present

## 2015-09-16 DIAGNOSIS — I6930 Unspecified sequelae of cerebral infarction: Secondary | ICD-10-CM

## 2015-09-19 NOTE — Progress Notes (Signed)
Carelink Summary Report / Loop Recorder 

## 2015-10-15 LAB — CUP PACEART REMOTE DEVICE CHECK: Date Time Interrogation Session: 20170908220558

## 2015-10-15 NOTE — Progress Notes (Signed)
[facility] summary report received. Battery status OK. Normal device function. No new symptom episodes, tachy episodes, brady, or pause episodes. No new AF episodes. Histograms right shifted. Monthly summary reports and ROV/PRN 

## 2015-10-17 ENCOUNTER — Ambulatory Visit (INDEPENDENT_AMBULATORY_CARE_PROVIDER_SITE_OTHER): Payer: BLUE CROSS/BLUE SHIELD | Admitting: *Deleted

## 2015-10-17 DIAGNOSIS — I6350 Cerebral infarction due to unspecified occlusion or stenosis of unspecified cerebral artery: Secondary | ICD-10-CM | POA: Diagnosis not present

## 2015-10-17 DIAGNOSIS — I63539 Cerebral infarction due to unspecified occlusion or stenosis of unspecified posterior cerebral artery: Secondary | ICD-10-CM

## 2015-10-17 NOTE — Progress Notes (Signed)
Carelink Summary Report / Loop Recorder 

## 2015-11-04 ENCOUNTER — Telehealth: Payer: Self-pay | Admitting: *Deleted

## 2015-11-04 DIAGNOSIS — N951 Menopausal and female climacteric states: Secondary | ICD-10-CM

## 2015-11-04 MED ORDER — VENLAFAXINE HCL ER 75 MG PO CP24: 75.0000 mg | ORAL_CAPSULE | Freq: Every day | ORAL | 3 refills | Status: DC

## 2015-11-04 NOTE — Telephone Encounter (Signed)
Refill request for effexor sent to pharmacy. Ok per Dr. Nehemiah Settle.

## 2015-11-15 ENCOUNTER — Ambulatory Visit (INDEPENDENT_AMBULATORY_CARE_PROVIDER_SITE_OTHER): Payer: BLUE CROSS/BLUE SHIELD | Admitting: *Deleted

## 2015-11-15 DIAGNOSIS — I6930 Unspecified sequelae of cerebral infarction: Secondary | ICD-10-CM | POA: Diagnosis not present

## 2015-11-16 NOTE — Progress Notes (Signed)
Carelink Summary Report / Loop Recorder 

## 2015-11-20 LAB — CUP PACEART REMOTE DEVICE CHECK
Date Time Interrogation Session: 20171008234153
MDC IDC PG IMPLANT DT: 20160317

## 2015-11-20 NOTE — Progress Notes (Signed)
[facility] summary report received. Battery status OK. Normal device function. No new symptom episodes, tachy episodes, brady, or pause episodes. No new AF episodes. Monthly summary reports and ROV/PRN 

## 2015-11-26 ENCOUNTER — Emergency Department (HOSPITAL_BASED_OUTPATIENT_CLINIC_OR_DEPARTMENT_OTHER): Payer: BLUE CROSS/BLUE SHIELD

## 2015-11-26 ENCOUNTER — Emergency Department (HOSPITAL_BASED_OUTPATIENT_CLINIC_OR_DEPARTMENT_OTHER)
Admission: EM | Admit: 2015-11-26 | Discharge: 2015-11-26 | Disposition: A | Discharge status: Home / Self Care | Payer: BLUE CROSS/BLUE SHIELD | Attending: Emergency Medicine | Admitting: Emergency Medicine

## 2015-11-26 ENCOUNTER — Encounter (HOSPITAL_BASED_OUTPATIENT_CLINIC_OR_DEPARTMENT_OTHER): Payer: Self-pay | Admitting: Emergency Medicine

## 2015-11-26 DIAGNOSIS — Z7984 Long term (current) use of oral hypoglycemic drugs: Secondary | ICD-10-CM | POA: Diagnosis not present

## 2015-11-26 DIAGNOSIS — Y999 Unspecified external cause status: Secondary | ICD-10-CM | POA: Insufficient documentation

## 2015-11-26 DIAGNOSIS — M545 Low back pain: Secondary | ICD-10-CM | POA: Diagnosis not present

## 2015-11-26 DIAGNOSIS — F17210 Nicotine dependence, cigarettes, uncomplicated: Secondary | ICD-10-CM | POA: Diagnosis not present

## 2015-11-26 DIAGNOSIS — I10 Essential (primary) hypertension: Secondary | ICD-10-CM | POA: Diagnosis not present

## 2015-11-26 DIAGNOSIS — Y9389 Activity, other specified: Secondary | ICD-10-CM | POA: Diagnosis not present

## 2015-11-26 DIAGNOSIS — E119 Type 2 diabetes mellitus without complications: Secondary | ICD-10-CM | POA: Diagnosis not present

## 2015-11-26 DIAGNOSIS — Z79899 Other long term (current) drug therapy: Secondary | ICD-10-CM | POA: Insufficient documentation

## 2015-11-26 DIAGNOSIS — Y929 Unspecified place or not applicable: Secondary | ICD-10-CM | POA: Insufficient documentation

## 2015-11-26 DIAGNOSIS — S3992XA Unspecified injury of lower back, initial encounter: Secondary | ICD-10-CM | POA: Diagnosis present

## 2015-11-26 DIAGNOSIS — S300XXA Contusion of lower back and pelvis, initial encounter: Secondary | ICD-10-CM | POA: Diagnosis not present

## 2015-11-26 DIAGNOSIS — X501XXA Overexertion from prolonged static or awkward postures, initial encounter: Secondary | ICD-10-CM | POA: Diagnosis not present

## 2015-11-26 MED ORDER — LIDOCAINE 5 % EX PTCH: 1.0000 | MEDICATED_PATCH | CUTANEOUS | 0 refills | Status: DC

## 2015-11-26 MED ORDER — CYCLOBENZAPRINE HCL 10 MG PO TABS: 10.0000 mg | ORAL_TABLET | Freq: Two times a day (BID) | ORAL | 0 refills | Status: DC | PRN

## 2015-11-26 MED ORDER — TRAMADOL HCL 50 MG PO TABS
50.0000 mg | ORAL_TABLET | Freq: Once | ORAL | Status: AC
  Administered 2015-11-26: 50 mg via ORAL
  Filled 2015-11-26: qty 1

## 2015-11-26 NOTE — ED Notes (Signed)
Patient transported to X-ray 

## 2015-11-26 NOTE — ED Notes (Addendum)
States sat down on the porch to hard x 1 week ago and has been having pain to "tailbone and it goes up my back" No obvious  injury noted to sacral or back area

## 2015-11-26 NOTE — ED Notes (Signed)
Pt amb to [facility]

## 2015-11-26 NOTE — ED Provider Notes (Signed)
[place] DEPT MHP Provider Note   CSN: [id number] Arrival date & time: 11/26/15  1738   By signing my name below, I, [name], attest that this documentation has been prepared under the direction and in the presence of [name]. Electronically Signed: Eunice Donaldson, Scribe. 11/26/15. 8:57 PM.   History   Chief Complaint Chief Complaint  Patient presents with  . Tailbone Pain  . Back Pain  The history is provided by the patient. No language interpreter was used.   HPI Comments: [name] is a 50 y.o. female who presents to the Emergency Department complaining of sudden onset, constant, gradual worsening moderat right hip pain x 1 week. Pt believes that she bruised her tailbone  after sitting on her porch with too much force. Pt reports associated low back pain. She reports use of Tylenol with no relief. She reports taking xanax for anxiety. She is allergic to codeine, aspirin, amoxicillin, and penicillin. Pt denies urinary or bowel symptoms, numbness and tingling, fever, Hx of drug use and Hx of cancer.  Past Medical History:  Diagnosis Date  . Acid reflux   . Adrenal benign tumor    Apparently enlarging and patient "is supposed to have it taken out"  . Anxiety   . Depression   . Diabetes mellitus without complication ([place])   . Hypertension   . PE (pulmonary embolism)   . Stroke [facility])     Patient Active Problem List   Diagnosis Date Noted  . Chronic ischemic left PCA stroke 07/30/2013  . TIA (transient ischemic attack) 07/30/2013  . Posterior circulation stroke of uncertain pathology ([place]) 07/30/2013    Past Surgical History:  Procedure Laterality Date  . LOOP RECORDER IMPLANT N/A 03/25/2014   Procedure: LOOP RECORDER IMPLANT;  Surgeon: [name], MD;  Location[facility] CATH LAB;  Service: Cardiovascular;  Laterality: N/A;  . NO PAST SURGERIES    . TEE WITHOUT CARDIOVERSION N/A 03/25/2014   Procedure: TRANSESOPHAGEAL ECHOCARDIOGRAM (TEE);  Surgeon:  [name], MD;  Location: [facility] ENDOSCOPY;  Service: Cardiovascular;  Laterality: N/A;    OB History    Gravida Para Term Preterm AB Living   2 2 2  0 0 2   SAB TAB Ectopic Multiple Live Births   0 0 0 0         Home Medications    Prior to Admission medications   Medication Sig Start Date End Date Taking? Authorizing Provider  ALPRAZolam [name]) 1 MG tablet Take 1 mg by mouth 2 (two) times daily as needed for anxiety.   Yes Historical Provider, MD  clopidogrel (PLAVIX) 75 MG tablet Take 1 tablet (75 mg total) by mouth daily. 07/31/13  Yes [name], MD  dicyclomine (BENTYL) 20 MG tablet Take 1 tablet (20 mg total) by mouth 2 (two) times daily. 05/25/14  Yes [name], PA-C  fenofibrate 160 MG tablet Take 1 tablet (160 mg total) by mouth daily. 07/31/13  Yes [name], MD  lisinopril (PRINIVIL,ZESTRIL) 20 MG tablet Take 20 mg by mouth daily.   Yes Historical Provider, MD  metFORMIN (GLUCOPHAGE-XR) 500 MG 24 hr tablet Take 1 tablet (500 mg total) by mouth daily with breakfast. Patient taking differently: Take 500 mg by mouth 4 (four) times daily.  10/24/12  Yes [name], PA-C  ondansetron (ZOFRAN) 4 MG tablet Take 1 tablet (4 mg total) by mouth every 6 (six) hours as needed for nausea or vomiting. [id number]  Yes [name], MD  PARoxetine (  PAXIL) 40 MG tablet Take 40 mg by mouth every morning.   Yes Historical Provider, MD  pravastatin (PRAVACHOL) 10 MG tablet Take 1 tablet by mouth daily. 03/08/14  Yes Historical Provider, MD  venlafaxine XR (EFFEXOR XR) 75 MG 24 hr capsule Take 1 capsule (75 mg total) by mouth daily with breakfast. 07/15/15  Yes [name], MD  venlafaxine XR (EFFEXOR-XR) 75 MG 24 hr capsule Take 1 capsule (75 mg total) by mouth daily. 11/04/15  Yes [name], DO    Family History Family History  Problem Relation Age of Onset  . Heart attack Father 37    Social History Social History  Substance Use Topics  . Smoking status: Current  Every Day Smoker    Packs/day: 1.00    Years: 40.00    Types: Cigarettes  . Smokeless tobacco: Never Used  . Alcohol use No     Allergies   Amoxicillin; Aspirin; Codeine; and Penicillins   Review of Systems Review of Systems  Constitutional: Negative for fever.  Gastrointestinal: Negative.  Negative for blood in stool.  Genitourinary: Negative.   Musculoskeletal: Positive for arthralgias, back pain and myalgias.  Neurological: Negative for numbness.     Physical Exam Updated Vital Signs BP 105/71 (BP Location: Right Arm)   Pulse 98   Temp 98.5 F (36.9 C) (Oral)   Resp 20   Ht 5\' 6"  (1.676 m)   Wt 220 lb (99.8 kg)   LMP  (LMP Unknown) Comment: "a few months"  SpO2 98%   BMI 35.51 kg/m   Physical Exam  Constitutional: She is oriented to person, place, and time. She appears well-developed and well-nourished.  Obese female laying in left lateral decubitus position appears to be in some discomfort  HENT:  Head: Normocephalic.  Eyes: EOM are normal.  Neck: Normal range of motion.  Cardiovascular: Intact distal pulses.   Pulmonary/Chest: Effort normal.  Abdominal: She exhibits no distension.  Musculoskeletal: Normal range of motion.  Tenderness to the lumbosacral region on palpation without step off or deformity.  R hip nontender, FROM, no bruising noted.  Neurological: She is alert and oriented to person, place, and time.  Psychiatric: She has a normal mood and affect.  Nursing note and vitals reviewed.    ED Treatments / Results  DIAGNOSTIC STUDIES: Oxygen Saturation is 100% on RA, normal by my interpretation.    COORDINATION OF CARE: 9:05 PM Will order XR. Discussed treatment plan with pt at bedside and pt agreed to plan.   Labs (all labs ordered are listed, but only abnormal results are displayed) Labs Reviewed - No data to display  EKG  EKG Interpretation None       Radiology Dg Lumbar Spine Complete  Result Date: 11/26/2015 CLINICAL DATA:   Status post fall into seated position, with worsening lower back pain. Initial encounter. EXAM: LUMBAR SPINE - COMPLETE 4+ VIEW COMPARISON:  CT of the abdomen and pelvis performed 02/26/2014 FINDINGS: There is no evidence of fracture or subluxation. Vertebral bodies demonstrate normal height and alignment. Intervertebral disc spaces are preserved. The visualized neural foramina are grossly unremarkable in appearance. The visualized bowel gas pattern is unremarkable in appearance; air and stool are noted within the colon. The sacroiliac joints are within normal limits. IMPRESSION: No evidence of fracture or subluxation along the lumbar spine. Electronically Signed   By: Garald Balding M.D.   On: 11/26/2015 21:19    Procedures Procedures (including critical care time)  Medications Ordered in ED Medications -  No data to display   Initial Impression / Assessment and Plan / ED Course  I have reviewed the triage vital signs and the nursing notes.  Pertinent labs & imaging results that were available during my care of the patient were reviewed by me and considered in my medical decision making (see chart for details).  Clinical Course     Patient with back pain.  No neurological deficits and normal neuro exam.  Patient is ambulatory.  No loss of bowel or bladder control.  No concern for cauda equina.  No fever, night sweats, weight loss, h/o cancer, IVDA, no recent procedure to back. No urinary symptoms suggestive of UTI.  Supportive care and return precaution discussed. Appears safe for discharge at this time. Follow up as indicated in discharge paperwork.    Final Clinical Impressions(s) / ED Diagnoses   Final diagnoses:  Sacral contusion, initial encounter    New Prescriptions New Prescriptions   CYCLOBENZAPRINE (FLEXERIL) 10 MG TABLET    Take 1 tablet (10 mg total) by mouth 2 (two) times daily as needed for muscle spasms.   LIDOCAINE (LIDODERM) 5 %    Place 1 patch onto the skin daily.  Remove & Discard patch within 12 hours or as directed by MD  I personally performed the services described in this documentation, which was scribed in my presence. The recorded information has been reviewed and is accurate.      Natalie Moras, PA-C 11/26/15 2138    Malvin Johns, MD 11/26/15 2231

## 2015-11-26 NOTE — ED Triage Notes (Signed)
Pt c/o tailbone pain for past week that is now radiating up right lower back.  Pt denies any urinary symptoms, states mild nausea.

## 2015-12-15 ENCOUNTER — Ambulatory Visit (INDEPENDENT_AMBULATORY_CARE_PROVIDER_SITE_OTHER): Payer: BLUE CROSS/BLUE SHIELD | Admitting: *Deleted

## 2015-12-15 DIAGNOSIS — I6930 Unspecified sequelae of cerebral infarction: Secondary | ICD-10-CM

## 2015-12-16 NOTE — Progress Notes (Signed)
Carelink Summary Report / Loop Recorder 

## 2015-12-31 LAB — CUP PACEART REMOTE DEVICE CHECK
Date Time Interrogation Session: 20171108004014
Implantable Pulse Generator Implant Date: 20160317

## 2016-01-14 ENCOUNTER — Emergency Department (HOSPITAL_BASED_OUTPATIENT_CLINIC_OR_DEPARTMENT_OTHER)
Admission: EM | Admit: 2016-01-14 | Discharge: 2016-01-14 | Disposition: A | Discharge status: Home / Self Care | Payer: BLUE CROSS/BLUE SHIELD | Attending: Emergency Medicine | Admitting: Emergency Medicine

## 2016-01-14 ENCOUNTER — Encounter (HOSPITAL_BASED_OUTPATIENT_CLINIC_OR_DEPARTMENT_OTHER): Payer: Self-pay | Admitting: Emergency Medicine

## 2016-01-14 DIAGNOSIS — Y929 Unspecified place or not applicable: Secondary | ICD-10-CM | POA: Insufficient documentation

## 2016-01-14 DIAGNOSIS — X58XXXA Exposure to other specified factors, initial encounter: Secondary | ICD-10-CM | POA: Insufficient documentation

## 2016-01-14 DIAGNOSIS — F17210 Nicotine dependence, cigarettes, uncomplicated: Secondary | ICD-10-CM | POA: Diagnosis not present

## 2016-01-14 DIAGNOSIS — M533 Sacrococcygeal disorders, not elsewhere classified: Secondary | ICD-10-CM | POA: Diagnosis not present

## 2016-01-14 DIAGNOSIS — S3992XA Unspecified injury of lower back, initial encounter: Secondary | ICD-10-CM | POA: Diagnosis present

## 2016-01-14 DIAGNOSIS — E119 Type 2 diabetes mellitus without complications: Secondary | ICD-10-CM | POA: Diagnosis not present

## 2016-01-14 DIAGNOSIS — Y939 Activity, unspecified: Secondary | ICD-10-CM | POA: Insufficient documentation

## 2016-01-14 DIAGNOSIS — Y999 Unspecified external cause status: Secondary | ICD-10-CM | POA: Diagnosis not present

## 2016-01-14 DIAGNOSIS — Z7984 Long term (current) use of oral hypoglycemic drugs: Secondary | ICD-10-CM | POA: Diagnosis not present

## 2016-01-14 DIAGNOSIS — I10 Essential (primary) hypertension: Secondary | ICD-10-CM | POA: Diagnosis not present

## 2016-01-14 MED ORDER — HYDROCODONE-ACETAMINOPHEN 5-325 MG PO TABS
1.0000 | ORAL_TABLET | Freq: Once | ORAL | Status: AC
  Administered 2016-01-14: 1 via ORAL
  Filled 2016-01-14: qty 1

## 2016-01-14 MED ORDER — HYDROCODONE-ACETAMINOPHEN 5-325 MG PO TABS: 1.0000 | ORAL_TABLET | Freq: Four times a day (QID) | ORAL | 0 refills | Status: DC | PRN

## 2016-01-14 NOTE — ED Triage Notes (Signed)
Patient states that she injured her tailbone about 3 months ago. Reports that it is getting worse and intolerable tonight

## 2016-01-14 NOTE — ED Provider Notes (Signed)
[place] DEPT MHP Provider Note: [name], MD, FACEP  CSN: [id number] MRN: [id number] ARRIVAL: 01/14/16 at [place]: [place]  [name] is a 51 y.o. female who injured her coccyx about 3 months ago. He healed without sequela. She is here now with a return of coccygeal pain. She is having severe pain in her coccyx, worse with sitting or with movement. The pain radiates to the left buttock. She denies reinjuring her coccyx.   Past Medical History:  Diagnosis Date  . Acid reflux   . Adrenal benign tumor    Apparently enlarging and patient "is supposed to have it taken out"  . Anxiety   . Depression   . Diabetes mellitus without complication ([place])   . Hypertension   . PE (pulmonary embolism)   . Stroke [facility])     Past Surgical History:  Procedure Laterality Date  . LOOP RECORDER IMPLANT N/A 03/25/2014   Procedure: LOOP RECORDER IMPLANT;  Surgeon: [name], MD;  Location: [facility] CATH LAB;  Service: Cardiovascular;  Laterality: N/A;  . NO PAST SURGERIES    . TEE WITHOUT CARDIOVERSION N/A 03/25/2014   Procedure: TRANSESOPHAGEAL ECHOCARDIOGRAM (TEE);  Surgeon: [name], MD;  Location: [facility] ENDOSCOPY;  Service: Cardiovascular;  Laterality: N/A;    Family History  Problem Relation Age of Onset  . Heart attack Father 21    Social History  Substance Use Topics  . Smoking status: Current Every Day Smoker    Packs/day: 1.00    Years: 40.00    Types: Cigarettes  . Smokeless tobacco: Never Used  . Alcohol use No    Prior to Admission medications   Medication Sig Start Date End Date Taking? Authorizing Provider  ALPRAZolam [name]) 1 MG tablet Take 1 mg by mouth 2 (two) times daily as needed for anxiety.    Historical Provider, MD  clopidogrel (PLAVIX) 75 MG tablet Take 1 tablet (75 mg total) by mouth daily. 07/31/13   [name], MD  cyclobenzaprine (FLEXERIL) 10 MG tablet Take 1 tablet (10  mg total) by mouth 2 (two) times daily as needed for muscle spasms. 11/26/15   [name], PA-C  dicyclomine (BENTYL) 20 MG tablet Take 1 tablet (20 mg total) by mouth 2 (two) times daily. 05/25/14   [name], PA-C  fenofibrate 160 MG tablet Take 1 tablet (160 mg total) by mouth daily. 07/31/13   [name], MD  lidocaine (LIDODERM) 5 % Place 1 patch onto the skin daily. Remove & Discard patch within 12 hours or as directed by MD 11/26/15   [name], PA-C  lisinopril (PRINIVIL,ZESTRIL) 20 MG tablet Take 20 mg by mouth daily.    Historical Provider, MD  metFORMIN (GLUCOPHAGE-XR) 500 MG 24 hr tablet Take 1 tablet (500 mg total) by mouth daily with breakfast. Patient taking differently: Take 500 mg by mouth 4 (four) times daily.  10/24/12   Fransico Meadow, PA-C  ondansetron (ZOFRAN) 4 MG tablet Take 1 tablet (4 mg total) by mouth every 6 (six) hours as needed for nausea or vomiting. [id number]   [name], MD  PARoxetine (PAXIL) 40 MG tablet Take 40 mg by mouth every morning.    Historical Provider, MD  pravastatin (PRAVACHOL) 10 MG tablet Take 1 tablet by mouth daily. 03/08/14   Historical Provider, MD  venlafaxine XR (EFFEXOR XR) 75 MG 24 hr capsule Take 1 capsule (75 mg total) by  mouth daily with breakfast. 07/15/15   [name], MD  venlafaxine XR (EFFEXOR-XR) 75 MG 24 hr capsule Take 1 capsule (75 mg total) by mouth daily. 11/04/15   [name], DO    Allergies Amoxicillin; Aspirin; Codeine; and Penicillins   REVIEW OF SYSTEMS  Negative except as noted here or in the History of Present Illness.   PHYSICAL EXAMINATION  Initial Vital Signs Blood pressure 143/97, pulse 110, temperature 98.6 F (37 C), temperature source Oral, resp. rate 22, height 5\' 6"  (1.676 m), weight 260 lb (117.9 kg), SpO2 100 %.  Examination General: Well-developed, well-nourished female in no acute distress; appearance consistent with age of record HENT: normocephalic; atraumatic Eyes: pupils  equal, round and reactive to light; extraocular muscles intact Neck: supple Heart: regular rate and rhythm Lungs: clear to auscultation bilaterally Abdomen: soft; nondistended; nontender; bowel sounds present Back: Severe coccygeal tenderness without deformity Extremities: No deformity; full range of motion; pulses normal Neurologic: Awake, alert and oriented; motor function intact in all extremities and symmetric; no facial droop Skin: Warm and dry Psychiatric: Normal mood and affect   RESULTS  Summary of this visit's results, reviewed by myself:   EKG Interpretation  Date/Time:    Ventricular Rate:    PR Interval:    QRS Duration:   QT Interval:    QTC Calculation:   R Axis:     Text Interpretation:        Laboratory Studies: No results found for this or any previous visit (from the past 24 hour(s)). Imaging Studies: No results found.  ED COURSE  Nursing notes and initial vitals signs, including pulse oximetry, reviewed.  Vitals:   01/14/16 0041 01/14/16 0042  BP: 143/97   Pulse: 110   Resp: 22   Temp: 98.6 F (37 C)   TempSrc: Oral   SpO2: 100%   Weight:  260 lb (117.9 kg)  Height:  5\' 6"  (1.676 m)   Consultation with the [name] state controlled substances database reveals the patient has received only one prescription for an opioid in the past year.   PROCEDURES    ED DIAGNOSES     ICD-9-CM ICD-10-CM   1. Coccyodynia 724.79 M53.3        Shanon Rosser, MD 01/14/16 [phone]

## 2016-01-16 ENCOUNTER — Ambulatory Visit (INDEPENDENT_AMBULATORY_CARE_PROVIDER_SITE_OTHER): Payer: BLUE CROSS/BLUE SHIELD | Admitting: *Deleted

## 2016-01-16 DIAGNOSIS — I6930 Unspecified sequelae of cerebral infarction: Secondary | ICD-10-CM

## 2016-01-17 NOTE — Progress Notes (Signed)
Carelink Summary Report / Loop Recorder 

## 2016-02-02 LAB — CUP PACEART REMOTE DEVICE CHECK
MDC IDC PG IMPLANT DT: 20160317
MDC IDC SESS DTM: 20171208011039

## 2016-02-02 NOTE — Progress Notes (Signed)
[facility] summary report received. Battery status OK. Normal device function. No new symptom episodes, tachy episodes, brady, or pause episodes. No new AF episodes. Monthly summary reports and ROV/PRN 

## 2016-02-13 ENCOUNTER — Encounter: Payer: Self-pay | Admitting: Gastroenterology

## 2016-02-13 ENCOUNTER — Ambulatory Visit (INDEPENDENT_AMBULATORY_CARE_PROVIDER_SITE_OTHER): Payer: BLUE CROSS/BLUE SHIELD | Admitting: *Deleted

## 2016-02-13 DIAGNOSIS — I6930 Unspecified sequelae of cerebral infarction: Secondary | ICD-10-CM

## 2016-02-14 NOTE — Progress Notes (Signed)
Carelink Summary Report / Loop Recorder 

## 2016-02-28 LAB — CUP PACEART REMOTE DEVICE CHECK
Date Time Interrogation Session: 20180107010918
MDC IDC PG IMPLANT DT: 20160317

## 2016-03-08 DIAGNOSIS — E1165 Type 2 diabetes mellitus with hyperglycemia: Secondary | ICD-10-CM | POA: Diagnosis not present

## 2016-03-08 DIAGNOSIS — I129 Hypertensive chronic kidney disease with stage 1 through stage 4 chronic kidney disease, or unspecified chronic kidney disease: Secondary | ICD-10-CM | POA: Diagnosis not present

## 2016-03-08 DIAGNOSIS — E1122 Type 2 diabetes mellitus with diabetic chronic kidney disease: Secondary | ICD-10-CM | POA: Diagnosis not present

## 2016-03-08 DIAGNOSIS — I10 Essential (primary) hypertension: Secondary | ICD-10-CM | POA: Diagnosis not present

## 2016-03-11 LAB — CUP PACEART REMOTE DEVICE CHECK
Date Time Interrogation Session: 20180206011140
MDC IDC PG IMPLANT DT: 20160317

## 2016-03-11 NOTE — Progress Notes (Signed)
[facility] summary report received. Battery status OK. Normal device function. No new symptom episodes, tachy episodes, brady, or pause episodes. No new AF episodes. Monthly summary reports and ROV/PRN 

## 2016-03-13 ENCOUNTER — Telehealth: Payer: Self-pay | Admitting: *Deleted

## 2016-03-13 DIAGNOSIS — F339 Major depressive disorder, recurrent, unspecified: Secondary | ICD-10-CM | POA: Diagnosis not present

## 2016-03-13 DIAGNOSIS — N183 Chronic kidney disease, stage 3 (moderate): Secondary | ICD-10-CM | POA: Diagnosis not present

## 2016-03-13 DIAGNOSIS — E1122 Type 2 diabetes mellitus with diabetic chronic kidney disease: Secondary | ICD-10-CM | POA: Diagnosis not present

## 2016-03-13 DIAGNOSIS — I129 Hypertensive chronic kidney disease with stage 1 through stage 4 chronic kidney disease, or unspecified chronic kidney disease: Secondary | ICD-10-CM | POA: Diagnosis not present

## 2016-03-13 NOTE — Telephone Encounter (Signed)
LMOVM requesting that patient send a manual transmission from her home monitor.  Gave [facility] phone number for questions/concerns. 

## 2016-03-14 ENCOUNTER — Ambulatory Visit (INDEPENDENT_AMBULATORY_CARE_PROVIDER_SITE_OTHER): Payer: BLUE CROSS/BLUE SHIELD | Admitting: *Deleted

## 2016-03-14 DIAGNOSIS — I6930 Unspecified sequelae of cerebral infarction: Secondary | ICD-10-CM | POA: Diagnosis not present

## 2016-03-15 NOTE — Progress Notes (Signed)
Carelink Summary Report / Loop Recorder 

## 2016-03-23 NOTE — Telephone Encounter (Signed)
LMOVM for patient.  Spoke with patient's husband, Richard (DPR).  He states that they are not home right now, but requests that I call back on Monday morning around 11am.  He states that the patient will be home at the time.  Will try to assist patient and husband with sending a manual Carelink transmission for review of episodes.

## 2016-03-26 NOTE — Telephone Encounter (Signed)
Spoke with patient and assisted her in sending a manual Carelink transmission for review.  Transmission received, all available ECGs false.  Tachy episodes show TWOS while pause, brady, and AF episodes all show intermittent undersensing.  Will schedule patient for Device Clinic appointment at her convenience for ILR reprogramming.

## 2016-03-29 NOTE — Telephone Encounter (Signed)
LMOVM requesting call back from patient.  Will request that patient schedule a Device Clinic appointment to turn pause and brady detection off (no history of syncope), to extend threshold sensing decay delay to cover TWOS, and to extend tachy episode duration to 48 beats.

## 2016-04-02 LAB — CUP PACEART REMOTE DEVICE CHECK
Date Time Interrogation Session: 20180308014005
Implantable Pulse Generator Implant Date: 20160317

## 2016-04-04 ENCOUNTER — Telehealth: Payer: Self-pay | Admitting: *Deleted

## 2016-04-04 NOTE — Telephone Encounter (Signed)
Opened in error

## 2016-04-04 NOTE — Telephone Encounter (Signed)
LMOVM requesting call back from patient.

## 2016-04-13 ENCOUNTER — Ambulatory Visit (INDEPENDENT_AMBULATORY_CARE_PROVIDER_SITE_OTHER): Payer: BLUE CROSS/BLUE SHIELD | Admitting: *Deleted

## 2016-04-13 DIAGNOSIS — I6930 Unspecified sequelae of cerebral infarction: Secondary | ICD-10-CM

## 2016-04-16 NOTE — Progress Notes (Signed)
Carelink Summary Report / Loop Recorder 

## 2016-04-18 NOTE — Telephone Encounter (Signed)
LMOVM requesting call back from patient regarding scheduling appointment for loop recorder reprogramming.

## 2016-04-19 NOTE — Telephone Encounter (Signed)
Spoke with patient's husband (DPR).  Requested that patient schedule a Device Clinic appointment (plan to reprogram pause and brady detection off, extend threshold sensing decay delay to cover TWOS, and to extend tachy episode duration to 48 beats).  Patient's husband took down our direct number and will plan to have patient call back to schedule.  He denies additional questions or concerns at this time.

## 2016-04-22 LAB — CUP PACEART REMOTE DEVICE CHECK
Date Time Interrogation Session: 20180407021100
Implantable Pulse Generator Implant Date: 20160317

## 2016-04-22 NOTE — Progress Notes (Signed)
Carelink summary report received. Battery status OK. Normal device function. No new symptom episodes. 7 tachy episodes- ECGs appear TWOS, vent. undersensing noted. 13 brady- ECGs appear vent. undersensing. 174 pause episodes- no true pauses noted on available ECGs, all appear vent. undersensing. 7 AF episodes- 0%, ECGs appear vent. undersensing and intermittent TWOS. Monthly summary reports and ROV/PRN

## 2016-04-23 NOTE — Telephone Encounter (Signed)
LMOVM requesting call back.  [facility] number for return call.

## 2016-05-10 ENCOUNTER — Telehealth: Payer: Self-pay | Admitting: Cardiology

## 2016-05-10 NOTE — Telephone Encounter (Signed)
LMOVM requesting that pt send manual transmission b/c home monitor has not updated in at least 7 days.    

## 2016-05-14 ENCOUNTER — Ambulatory Visit (INDEPENDENT_AMBULATORY_CARE_PROVIDER_SITE_OTHER): Payer: BLUE CROSS/BLUE SHIELD | Admitting: *Deleted

## 2016-05-14 DIAGNOSIS — I63539 Cerebral infarction due to unspecified occlusion or stenosis of unspecified posterior cerebral artery: Secondary | ICD-10-CM | POA: Diagnosis not present

## 2016-05-15 NOTE — Progress Notes (Signed)
Carelink Summary Report / Loop Recorder 

## 2016-05-16 ENCOUNTER — Observation Stay (HOSPITAL_BASED_OUTPATIENT_CLINIC_OR_DEPARTMENT_OTHER)
Admission: EM | Admit: 2016-05-16 | Discharge: 2016-05-17 | Disposition: A | Discharge status: Home / Self Care | Payer: BLUE CROSS/BLUE SHIELD | Attending: Family Medicine | Admitting: Family Medicine

## 2016-05-16 ENCOUNTER — Encounter (HOSPITAL_BASED_OUTPATIENT_CLINIC_OR_DEPARTMENT_OTHER): Payer: Self-pay

## 2016-05-16 ENCOUNTER — Emergency Department (HOSPITAL_BASED_OUTPATIENT_CLINIC_OR_DEPARTMENT_OTHER): Payer: BLUE CROSS/BLUE SHIELD

## 2016-05-16 DIAGNOSIS — Z7984 Long term (current) use of oral hypoglycemic drugs: Secondary | ICD-10-CM | POA: Insufficient documentation

## 2016-05-16 DIAGNOSIS — I6930 Unspecified sequelae of cerebral infarction: Secondary | ICD-10-CM

## 2016-05-16 DIAGNOSIS — F419 Anxiety disorder, unspecified: Secondary | ICD-10-CM | POA: Insufficient documentation

## 2016-05-16 DIAGNOSIS — Z8673 Personal history of transient ischemic attack (TIA), and cerebral infarction without residual deficits: Secondary | ICD-10-CM

## 2016-05-16 DIAGNOSIS — R05 Cough: Secondary | ICD-10-CM

## 2016-05-16 DIAGNOSIS — N289 Disorder of kidney and ureter, unspecified: Secondary | ICD-10-CM | POA: Diagnosis not present

## 2016-05-16 DIAGNOSIS — Z880 Allergy status to penicillin: Secondary | ICD-10-CM | POA: Diagnosis not present

## 2016-05-16 DIAGNOSIS — F17210 Nicotine dependence, cigarettes, uncomplicated: Secondary | ICD-10-CM | POA: Diagnosis not present

## 2016-05-16 DIAGNOSIS — F329 Major depressive disorder, single episode, unspecified: Secondary | ICD-10-CM | POA: Diagnosis not present

## 2016-05-16 DIAGNOSIS — N183 Chronic kidney disease, stage 3 unspecified: Secondary | ICD-10-CM | POA: Diagnosis present

## 2016-05-16 DIAGNOSIS — E1122 Type 2 diabetes mellitus with diabetic chronic kidney disease: Secondary | ICD-10-CM | POA: Insufficient documentation

## 2016-05-16 DIAGNOSIS — I129 Hypertensive chronic kidney disease with stage 1 through stage 4 chronic kidney disease, or unspecified chronic kidney disease: Secondary | ICD-10-CM | POA: Insufficient documentation

## 2016-05-16 DIAGNOSIS — M7989 Other specified soft tissue disorders: Secondary | ICD-10-CM | POA: Diagnosis not present

## 2016-05-16 DIAGNOSIS — Z7902 Long term (current) use of antithrombotics/antiplatelets: Secondary | ICD-10-CM | POA: Insufficient documentation

## 2016-05-16 DIAGNOSIS — K219 Gastro-esophageal reflux disease without esophagitis: Secondary | ICD-10-CM | POA: Diagnosis not present

## 2016-05-16 DIAGNOSIS — Z86718 Personal history of other venous thrombosis and embolism: Secondary | ICD-10-CM | POA: Insufficient documentation

## 2016-05-16 DIAGNOSIS — Z79899 Other long term (current) drug therapy: Secondary | ICD-10-CM | POA: Insufficient documentation

## 2016-05-16 DIAGNOSIS — I82409 Acute embolism and thrombosis of unspecified deep veins of unspecified lower extremity: Secondary | ICD-10-CM | POA: Diagnosis present

## 2016-05-16 DIAGNOSIS — I82402 Acute embolism and thrombosis of unspecified deep veins of left lower extremity: Secondary | ICD-10-CM | POA: Diagnosis not present

## 2016-05-16 DIAGNOSIS — I10 Essential (primary) hypertension: Secondary | ICD-10-CM | POA: Diagnosis present

## 2016-05-16 DIAGNOSIS — E1159 Type 2 diabetes mellitus with other circulatory complications: Secondary | ICD-10-CM | POA: Diagnosis present

## 2016-05-16 DIAGNOSIS — Z86711 Personal history of pulmonary embolism: Secondary | ICD-10-CM | POA: Insufficient documentation

## 2016-05-16 DIAGNOSIS — R000 Tachycardia, unspecified: Secondary | ICD-10-CM | POA: Insufficient documentation

## 2016-05-16 DIAGNOSIS — I82432 Acute embolism and thrombosis of left popliteal vein: Secondary | ICD-10-CM | POA: Diagnosis not present

## 2016-05-16 DIAGNOSIS — R059 Cough, unspecified: Secondary | ICD-10-CM

## 2016-05-16 DIAGNOSIS — I82412 Acute embolism and thrombosis of left femoral vein: Principal | ICD-10-CM | POA: Insufficient documentation

## 2016-05-16 HISTORY — DX: Deep phlebothrombosis in pregnancy, unspecified trimester: O22.30

## 2016-05-16 LAB — BASIC METABOLIC PANEL
ANION GAP: 12 (ref 5–15)
BUN: 16 mg/dL (ref 6–20)
CHLORIDE: 101 mmol/L (ref 101–111)
CO2: 20 mmol/L — ABNORMAL LOW (ref 22–32)
CREATININE: 1.5 mg/dL — AB (ref 0.44–1.00)
Calcium: 9.5 mg/dL (ref 8.9–10.3)
GFR calc non Af Amer: 39 mL/min — ABNORMAL LOW (ref >60)
GFR, EST AFRICAN AMERICAN: 45 mL/min — AB (ref >60)
Glucose, Bld: 205 mg/dL — ABNORMAL HIGH (ref 65–99)
Potassium: 4.2 mmol/L (ref 3.5–5.1)
SODIUM: 133 mmol/L — AB (ref 135–145)

## 2016-05-16 LAB — CBC WITH DIFFERENTIAL/PLATELET
BASOS ABS: 0 10*3/uL (ref 0.0–0.1)
BASOS PCT: 0 %
EOS ABS: 0.2 10*3/uL (ref 0.0–0.7)
Eosinophils Relative: 1 %
HCT: 43.6 % (ref 36.0–46.0)
HEMOGLOBIN: 14.6 g/dL (ref 12.0–15.0)
Lymphocytes Relative: 13 %
Lymphs Abs: 2.3 10*3/uL (ref 0.7–4.0)
MCH: 31.4 pg (ref 26.0–34.0)
MCHC: 33.5 g/dL (ref 30.0–36.0)
MCV: 93.8 fL (ref 78.0–100.0)
MONOS PCT: 6 %
Monocytes Absolute: 1.1 10*3/uL — ABNORMAL HIGH (ref 0.1–1.0)
NEUTROS PCT: 80 %
Neutro Abs: 13.7 10*3/uL — ABNORMAL HIGH (ref 1.7–7.7)
Platelets: 314 10*3/uL (ref 150–400)
RBC: 4.65 MIL/uL (ref 3.87–5.11)
RDW: 13.9 % (ref 11.5–15.5)
WBC: 17.3 10*3/uL — AB (ref 4.0–10.5)

## 2016-05-16 LAB — TROPONIN I

## 2016-05-16 LAB — D-DIMER, QUANTITATIVE (NOT AT ARMC): D DIMER QUANT: 3.29 ug{FEU}/mL — AB (ref 0.00–0.50)

## 2016-05-16 MED ORDER — HEPARIN (PORCINE) IN NACL 100-0.45 UNIT/ML-% IJ SOLN
1400.0000 [IU]/h | INTRAMUSCULAR | Status: DC
  Administered 2016-05-16: 1400 [IU]/h via INTRAVENOUS
  Filled 2016-05-16: qty 250

## 2016-05-16 MED ORDER — MORPHINE SULFATE (PF) 4 MG/ML IV SOLN
4.0000 mg | Freq: Once | INTRAVENOUS | Status: AC
  Administered 2016-05-16: 4 mg via INTRAVENOUS

## 2016-05-16 MED ORDER — HEPARIN BOLUS VIA INFUSION
4500.0000 [IU] | Freq: Once | INTRAVENOUS | Status: AC
  Administered 2016-05-16: 4500 [IU] via INTRAVENOUS

## 2016-05-16 MED ORDER — MORPHINE SULFATE (PF) 4 MG/ML IV SOLN
INTRAVENOUS | Status: AC
  Administered 2016-05-16: 4 mg via INTRAVENOUS
  Filled 2016-05-16: qty 1

## 2016-05-16 MED ORDER — SODIUM CHLORIDE 0.9 % IV BOLUS (SEPSIS)
500.0000 mL | Freq: Once | INTRAVENOUS | Status: AC
  Administered 2016-05-16: 500 mL via INTRAVENOUS

## 2016-05-16 NOTE — ED Notes (Signed)
ED Provider at bedside. 

## 2016-05-16 NOTE — ED Provider Notes (Signed)
[facility] DEPT [facility] Provider Note   CSN: [id number] Arrival date & time: 05/16/16  2014  By signing my name below, I, Higinio Plan, attest that this documentation has been prepared under the direction and in the presence of Dorie Rank, MD . Electronically Signed: Higinio Plan, Scribe. 05/16/2016. 9:25 PM.  History   Chief Complaint Chief Complaint  Patient presents with  . Leg Pain   The history is provided by the patient. No language interpreter was used.   HPI Comments: [name] is a 51 y.o. female with PMHx of DM, DVT, HTN, PE, and stroke, who presents to the Emergency Department complaining of gradually improving, left leg pain s/p an injury that occurred this morning. Pt reports she tripped on her dog this morning and felt a "pop" in her posterior lower leg. She states associated swelling to her posterior left calf and thigh. She denies any chest pain, shortness of breath, fever, chills, or hx of lung problems. Pt endorses smoking ~1 pack of cigarettes every day.   Past Medical History:  Diagnosis Date  . Acid reflux   . Adrenal benign tumor    Apparently enlarging and patient "is supposed to have it taken out"  . Anxiety   . Depression   . Diabetes mellitus without complication ([place])   . DVT (deep vein thrombosis) in pregnancy ([place])   . Hypertension   . PE (pulmonary embolism)   . Stroke [facility])     Patient Active Problem List   Diagnosis Date Noted  . DVT (deep vein thrombosis) in pregnancy ([place]) 05/16/2016  . DVT (deep venous thrombosis) ([place]) 05/16/2016  . Chronic ischemic left PCA stroke 07/30/2013  . TIA (transient ischemic attack) 07/30/2013  . Posterior circulation stroke of uncertain pathology ([place]) 07/30/2013    Past Surgical History:  Procedure Laterality Date  . LOOP RECORDER IMPLANT N/A 03/25/2014   Procedure: LOOP RECORDER IMPLANT;  Surgeon: [name], MD;  Location: [facility] CATH LAB;  Service: Cardiovascular;  Laterality: N/A;  . NO PAST SURGERIES     . TEE WITHOUT CARDIOVERSION N/A 03/25/2014   Procedure: TRANSESOPHAGEAL ECHOCARDIOGRAM (TEE);  Surgeon: [name], MD;  Location: [facility] ENDOSCOPY;  Service: Cardiovascular;  Laterality: N/A;    OB History    Gravida Para Term Preterm AB Living   2 2 2  0 0 2   SAB TAB Ectopic Multiple Live Births   0 0 0 0       Home Medications    Prior to Admission medications   Medication Sig Start Date End Date Taking? Authorizing Provider  ALPRAZolam [name]) 1 MG tablet Take 1 mg by mouth 2 (two) times daily as needed for anxiety.    [provider]  clopidogrel (PLAVIX) 75 MG tablet Take 1 tablet (75 mg total) by mouth daily. 07/31/13   [name], MD  cyclobenzaprine (FLEXERIL) 10 MG tablet Take 1 tablet (10 mg total) by mouth 2 (two) times daily as needed for muscle spasms. 11/26/15   [name], PA-C  dicyclomine (BENTYL) 20 MG tablet Take 1 tablet (20 mg total) by mouth 2 (two) times daily. 05/25/14   [name], PA-C  fenofibrate 160 MG tablet Take 1 tablet (160 mg total) by mouth daily. 07/31/13   [name], MD  lidocaine (LIDODERM) 5 % Place 1 patch onto the skin daily. Remove & Discard patch within 12 hours or as directed by MD 11/26/15   [name], PA-C  lisinopril (PRINIVIL,ZESTRIL) 20 MG tablet Take 20  mg by mouth daily.    [provider]  metFORMIN (GLUCOPHAGE-XR) 500 MG 24 hr tablet Take 1 tablet (500 mg total) by mouth daily with breakfast. Patient taking differently: Take 500 mg by mouth 4 (four) times daily.  10/24/12   Fransico Meadow, PA-C  ondansetron (ZOFRAN) 4 MG tablet Take 1 tablet (4 mg total) by mouth every 6 (six) hours as needed for nausea or vomiting. 4/74/25   [name], MD  PARoxetine (PAXIL) 40 MG tablet Take 40 mg by mouth every morning.    [provider]  pravastatin (PRAVACHOL) 10 MG tablet Take 1 tablet by mouth daily. 03/08/14   [provider]  venlafaxine XR (EFFEXOR XR) 75 MG 24 hr capsule Take 1 capsule  (75 mg total) by mouth daily with breakfast. 07/15/15   [name], MD  venlafaxine XR (EFFEXOR-XR) 75 MG 24 hr capsule Take 1 capsule (75 mg total) by mouth daily. 11/04/15   [name], DO    Family History Family History  Problem Relation Age of Onset  . Heart attack Father 28    Social History Social History  Substance Use Topics  . Smoking status: Current Every Day Smoker    Packs/day: 1.00    Years: 40.00    Types: Cigarettes  . Smokeless tobacco: Never Used  . Alcohol use No   Allergies   Amoxicillin; Aspirin; Codeine; and Penicillins  Review of Systems Review of Systems  Constitutional: Negative for chills and fever.  Respiratory: Negative for shortness of breath.   Cardiovascular: Negative for chest pain.  Musculoskeletal: Positive for arthralgias.       +swelling to left lower leg   All other systems reviewed and are negative.  Physical Exam Updated Vital Signs BP 97/75 (BP Location: Right Arm)   Pulse (!) 140   Temp 99.2 F (37.3 C) (Oral)   Resp (!) 24   Ht 5\' 6"  (1.676 m)   Wt 212 lb (96.2 kg)   SpO2 97%   BMI 34.22 kg/m   Physical Exam  Constitutional: She appears well-developed and well-nourished. No distress.  HENT:  Head: Normocephalic and atraumatic.  Right Ear: External ear normal.  Left Ear: External ear normal.  Eyes: Conjunctivae are normal. Right eye exhibits no discharge. Left eye exhibits no discharge. No scleral icterus.  Neck: Neck supple. No tracheal deviation present.  Cardiovascular: Regular rhythm and intact distal pulses.  Tachycardia present.   HR 120 at bedside   Pulmonary/Chest: Effort normal and breath sounds normal. No stridor. No respiratory distress. She has no wheezes. She has no rales.  Abdominal: Soft. Bowel sounds are normal. She exhibits no distension. There is no tenderness. There is no rebound and no guarding.  Musculoskeletal: She exhibits tenderness. She exhibits no edema.  TTP of left posterior  thigh and calf. No erythema or edema   Neurological: She is alert. She has normal strength. No cranial nerve deficit (no facial droop, extraocular movements intact, no slurred speech) or sensory deficit. She exhibits normal muscle tone. She displays no seizure activity. Coordination normal.  Skin: Skin is warm and dry. No rash noted.  Psychiatric: She has a normal mood and affect.  Nursing note and vitals reviewed.  ED Treatments / Results  DIAGNOSTIC STUDIES:  Oxygen Saturation is 97% on RA, normal by my interpretation.    COORDINATION OF CARE:  9:21 PM Discussed treatment plan with pt at bedside and pt agreed to plan.  Labs (all labs ordered are listed,  but only abnormal results are displayed) Labs Reviewed  CBC WITH DIFFERENTIAL/PLATELET - Abnormal; Notable for the following:       Result Value   WBC 17.3 (*)    Neutro Abs 13.7 (*)    Monocytes Absolute 1.1 (*)    All other components within normal limits  BASIC METABOLIC PANEL - Abnormal; Notable for the following:    Sodium 133 (*)    CO2 20 (*)    Glucose, Bld 205 (*)    Creatinine, Ser 1.50 (*)    GFR calc non Af Amer 39 (*)    GFR calc Af Amer 45 (*)    All other components within normal limits  D-DIMER, QUANTITATIVE (NOT AT [facility]) - Abnormal; Notable for the following:    D-Dimer, Quant 3.29 (*)    All other components within normal limits  TROPONIN I  HEPARIN LEVEL (UNFRACTIONATED)  CBC    EKG  EKG Interpretation  Date/Time:  Wednesday May 16 2016 21:31:07 EDT Ventricular Rate:  132 PR Interval:    QRS Duration: 81 QT Interval:  310 QTC Calculation: 453 R Axis:   -14 Text Interpretation:  Sinus tachycardia Ventricular premature complex Probable left atrial enlargement Low voltage, extremity and precordial leads Nonspecific T abnormalities, lateral leads Minimal ST elevation, inferior leads Baseline wander in lead(s) III aVL aVF V3 V4 V5 V6 No significant change since last tracing Confirmed by Taffany Heiser  MD-J,  Isabella Roemmich ([id number]) on 05/16/2016 9:55:43 PM       Radiology Dg Chest 2 View  Result Date: 05/16/2016 CLINICAL DATA:  Felt a pop in LEFT lower extremity.  Swelling. EXAM: CHEST  2 VIEW COMPARISON:  04/07/2015. FINDINGS: The heart size and mediastinal contours are within normal limits. Both lungs are clear. The visualized skeletal structures are unremarkable. Loop recorder unchanged. IMPRESSION: No active cardiopulmonary disease. Electronically Signed   By: Staci Righter M.D.   On: 05/16/2016 22:38   US Venous Img Lower Unilateral Left  Result Date: 05/16/2016 CLINICAL DATA:  Pain and swelling in the left lower extremity today. Felt a pop earlier today. Previous history of DVT and pulmonary embolus. Tachycardia. EXAM: Left LOWER EXTREMITY VENOUS DOPPLER ULTRASOUND TECHNIQUE: Gray-scale sonography with graded compression, as well as color Doppler and duplex ultrasound were performed to evaluate the lower extremity deep venous systems from the level of the common femoral vein and including the common femoral, femoral, profunda femoral, popliteal and calf veins including the posterior tibial, peroneal and gastrocnemius veins when visible. The superficial great saphenous vein was also interrogated. Spectral Doppler was utilized to evaluate flow at rest and with distal augmentation maneuvers in the common femoral, femoral and popliteal veins. COMPARISON:  None. FINDINGS: Contralateral Common Femoral Vein: Respiratory phasicity is normal and symmetric with the symptomatic side. No evidence of thrombus. Normal compressibility. Common Femoral Vein: Echogenic nonocclusive thrombus is demonstrated with decreased flow on color flow Doppler imaging. Saphenofemoral Junction: Echogenic nonocclusive thrombus is demonstrated decreased flow. Profunda Femoral Vein: Echogenic expansile nonocclusive thrombus is demonstrated with decreased flow. Femoral Vein: The duplicated femoral vein is present. Nonocclusive echogenic thrombus is  demonstrated. Popliteal Vein: Echogenic occlusive expansile thrombus demonstrated throughout the popliteal vein. No flow on color flow Doppler imaging. Calf Veins: Thrombus is demonstrated within the posterior tibial vein. Peroneal vein is not visualized. Superficial Great Saphenous Vein: Superficial venous thrombosis is demonstrated. Venous Reflux:  None. Other Findings:  None. IMPRESSION: Examination is positive for acute deep venous thrombosis with nonocclusive involvement of the left common femoral,  deep femoral, and femoral veins as well as occlusive thrombus in the popliteal vein and calf veins. These results were called by telephone at the time of interpretation on 05/16/2016 at 10:42 pm to Dr. Dorie Rank , who verbally acknowledged these results. Electronically Signed   By: Lucienne Capers M.D.   On: 05/16/2016 22:44    Procedures Procedures (including critical care time)  Medications Ordered in ED Medications  heparin ADULT infusion 100 units/mL (25000 units/228mL sodium chloride 0.45%) (1,400 Units/hr Intravenous New Bag/Given 05/16/16 2314)  sodium chloride 0.9 % bolus 500 mL (0 mLs Intravenous Stopped 05/16/16 2215)  heparin bolus via infusion 4,500 Units (4,500 Units Intravenous Bolus from Bag 05/16/16 2313)  morphine 4 MG/ML injection 4 mg (4 mg Intravenous Given 05/16/16 2315)    Initial Impression / Assessment and Plan / ED Course  I have reviewed the triage vital signs and the nursing notes.  Pertinent labs & imaging results that were available during my care of the patient were reviewed by me and considered in my medical decision making (see chart for details).   patient presented to the emergency room with complaints of leg swelling. She is also noted to be tachycardic on arrival. Patient had a Doppler ultrasound of her lower extremity that demonstrates a DVT. Patient has remained tachycardic and I'm concerned about the possibility of pulmonary embolism however she has renal  insufficiency.  The patient will require low-dose contrast protocol and I discussed the case with the radiology tech. The patient's body size with likely contribute to an inadequate study. Plan is to admit the patient to [facility] for admission.  The patient will continue on a heparin infusion. She would likely benefit from a VQ scan on admission.   Final Clinical Impressions(s) / ED Diagnoses   Final diagnoses:  DVT (deep venous thrombosis) (HCC)  Tachycardia  Renal insufficiency   I personally performed the services described in this documentation, which was scribed in my presence.  The recorded information has been reviewed and is accurate.    Dorie Rank, MD 05/16/16 [phone]

## 2016-05-16 NOTE — ED Triage Notes (Addendum)
c/o pain to left LE that started after stepping over dog this am-NAD-presents to triage in w/c-reports hx of DVT to right leg

## 2016-05-16 NOTE — ED Notes (Signed)
Patient transported to Ultrasound 

## 2016-05-16 NOTE — ED Triage Notes (Addendum)
Pt assisted from w/c to scale for weight- DOE noted-pt states she has had some SHOB today

## 2016-05-16 NOTE — Progress Notes (Signed)
ANTICOAGULATION CONSULT NOTE  Pharmacy Consult for heparin Indication: VTE treatment  Heparin Dosing Weight: 80.7 kg   Assessment: 66 yof presenting with leg pain. PMH of DVT/PE. Pharamcy consulted to start heparin. Not on anticoagulation PTA - spoke with RN who confirmed with patient. D-dimer elevated at 3.29, CTA pending. CBC wnl on admit. No bleed documented.  Goal of Therapy:  Heparin level 0.3-0.7 units/ml Monitor platelets by anticoagulation protocol: Yes   Plan:  Heparin 4500 unit bolus Start heparin at 1400 units/h 6h heparin level Daily heparin level/CBC Monitor s/sx bleeding   Elicia Lamp, PharmD, BCPS Clinical Pharmacist 05/16/2016 10:46 PM

## 2016-05-17 ENCOUNTER — Observation Stay (HOSPITAL_COMMUNITY): Payer: BLUE CROSS/BLUE SHIELD

## 2016-05-17 ENCOUNTER — Encounter (HOSPITAL_COMMUNITY): Payer: Self-pay | Admitting: Internal Medicine

## 2016-05-17 ENCOUNTER — Other Ambulatory Visit: Payer: Self-pay | Admitting: Family Medicine

## 2016-05-17 DIAGNOSIS — I82402 Acute embolism and thrombosis of unspecified deep veins of left lower extremity: Secondary | ICD-10-CM | POA: Diagnosis not present

## 2016-05-17 DIAGNOSIS — N183 Chronic kidney disease, stage 3 unspecified: Secondary | ICD-10-CM | POA: Diagnosis present

## 2016-05-17 DIAGNOSIS — E1159 Type 2 diabetes mellitus with other circulatory complications: Secondary | ICD-10-CM | POA: Diagnosis present

## 2016-05-17 DIAGNOSIS — I10 Essential (primary) hypertension: Secondary | ICD-10-CM | POA: Diagnosis not present

## 2016-05-17 DIAGNOSIS — N951 Menopausal and female climacteric states: Secondary | ICD-10-CM

## 2016-05-17 DIAGNOSIS — I82409 Acute embolism and thrombosis of unspecified deep veins of unspecified lower extremity: Secondary | ICD-10-CM | POA: Diagnosis not present

## 2016-05-17 DIAGNOSIS — R05 Cough: Secondary | ICD-10-CM | POA: Diagnosis not present

## 2016-05-17 DIAGNOSIS — I6930 Unspecified sequelae of cerebral infarction: Secondary | ICD-10-CM

## 2016-05-17 LAB — CBC WITH DIFFERENTIAL/PLATELET
BASOS ABS: 0.1 10*3/uL (ref 0.0–0.1)
Basophils Relative: 0 %
Eosinophils Absolute: 0.3 10*3/uL (ref 0.0–0.7)
Eosinophils Relative: 2 %
HEMATOCRIT: 40.8 % (ref 36.0–46.0)
Hemoglobin: 13.2 g/dL (ref 12.0–15.0)
LYMPHS ABS: 5 10*3/uL — AB (ref 0.7–4.0)
LYMPHS PCT: 36 %
MCH: 30.4 pg (ref 26.0–34.0)
MCHC: 32.4 g/dL (ref 30.0–36.0)
MCV: 94 fL (ref 78.0–100.0)
MONO ABS: 1.1 10*3/uL — AB (ref 0.1–1.0)
Monocytes Relative: 8 %
NEUTROS ABS: 7.4 10*3/uL (ref 1.7–7.7)
Neutrophils Relative %: 54 %
Platelets: 297 10*3/uL (ref 150–400)
RBC: 4.34 MIL/uL (ref 3.87–5.11)
RDW: 14 % (ref 11.5–15.5)
WBC: 13.8 10*3/uL — ABNORMAL HIGH (ref 4.0–10.5)

## 2016-05-17 LAB — COMPREHENSIVE METABOLIC PANEL
ALBUMIN: 3.3 g/dL — AB (ref 3.5–5.0)
ALT: 12 U/L — ABNORMAL LOW (ref 14–54)
AST: 12 U/L — AB (ref 15–41)
Alkaline Phosphatase: 45 U/L (ref 38–126)
Anion gap: 11 (ref 5–15)
BUN: 13 mg/dL (ref 6–20)
CHLORIDE: 104 mmol/L (ref 101–111)
CO2: 20 mmol/L — AB (ref 22–32)
Calcium: 9 mg/dL (ref 8.9–10.3)
Creatinine, Ser: 1.3 mg/dL — ABNORMAL HIGH (ref 0.44–1.00)
GFR calc Af Amer: 54 mL/min — ABNORMAL LOW (ref >60)
GFR calc non Af Amer: 46 mL/min — ABNORMAL LOW (ref >60)
Glucose, Bld: 138 mg/dL — ABNORMAL HIGH (ref 65–99)
POTASSIUM: 3.9 mmol/L (ref 3.5–5.1)
SODIUM: 135 mmol/L (ref 135–145)
TOTAL PROTEIN: 6.5 g/dL (ref 6.5–8.1)
Total Bilirubin: 0.4 mg/dL (ref 0.3–1.2)

## 2016-05-17 LAB — GLUCOSE, CAPILLARY
GLUCOSE-CAPILLARY: 153 mg/dL — AB (ref 65–99)
GLUCOSE-CAPILLARY: 132 mg/dL — AB (ref 65–99)
GLUCOSE-CAPILLARY: 155 mg/dL — AB (ref 65–99)
Glucose-Capillary: 172 mg/dL — ABNORMAL HIGH (ref 65–99)

## 2016-05-17 LAB — LACTIC ACID, PLASMA: LACTIC ACID, VENOUS: 0.9 mmol/L (ref 0.5–1.9)

## 2016-05-17 LAB — HIV ANTIBODY (ROUTINE TESTING W REFLEX): HIV Screen 4th Generation wRfx: NONREACTIVE

## 2016-05-17 LAB — HEPARIN LEVEL (UNFRACTIONATED): HEPARIN UNFRACTIONATED: 0.16 [IU]/mL — AB (ref 0.30–0.70)

## 2016-05-17 LAB — TROPONIN I

## 2016-05-17 LAB — BRAIN NATRIURETIC PEPTIDE: B NATRIURETIC PEPTIDE 5: 23.6 pg/mL (ref 0.0–100.0)

## 2016-05-17 MED ORDER — PRAVASTATIN SODIUM 20 MG PO TABS
10.0000 mg | ORAL_TABLET | Freq: Every day | ORAL | Status: DC
  Administered 2016-05-17: 10 mg via ORAL
  Filled 2016-05-17: qty 1

## 2016-05-17 MED ORDER — INSULIN ASPART 100 UNIT/ML ~~LOC~~ SOLN
0.0000 [IU] | Freq: Three times a day (TID) | SUBCUTANEOUS | Status: DC
  Administered 2016-05-17: 2 [IU] via SUBCUTANEOUS
  Administered 2016-05-17: 1 [IU] via SUBCUTANEOUS
  Administered 2016-05-17: 2 [IU] via SUBCUTANEOUS

## 2016-05-17 MED ORDER — ACETAMINOPHEN 650 MG RE SUPP: 650.0000 mg | Freq: Four times a day (QID) | RECTAL | Status: DC | PRN

## 2016-05-17 MED ORDER — CYCLOBENZAPRINE HCL 10 MG PO TABS: 10.0000 mg | ORAL_TABLET | Freq: Two times a day (BID) | ORAL | Status: DC | PRN

## 2016-05-17 MED ORDER — VENLAFAXINE HCL ER 75 MG PO CP24
75.0000 mg | ORAL_CAPSULE | Freq: Every day | ORAL | Status: DC
  Administered 2016-05-17: 75 mg via ORAL
  Filled 2016-05-17: qty 1

## 2016-05-17 MED ORDER — APIXABAN 5 MG PO TABS
10.0000 mg | ORAL_TABLET | Freq: Two times a day (BID) | ORAL | Status: DC
  Administered 2016-05-17: 10 mg via ORAL
  Filled 2016-05-17: qty 2

## 2016-05-17 MED ORDER — PAROXETINE HCL 20 MG PO TABS
40.0000 mg | ORAL_TABLET | Freq: Every day | ORAL | Status: DC
  Administered 2016-05-17: 40 mg via ORAL
  Filled 2016-05-17: qty 2

## 2016-05-17 MED ORDER — OXYCODONE-ACETAMINOPHEN 5-325 MG PO TABS
1.0000 | ORAL_TABLET | Freq: Four times a day (QID) | ORAL | Status: DC | PRN
  Administered 2016-05-17 (×2): 1 via ORAL
  Filled 2016-05-17 (×2): qty 1

## 2016-05-17 MED ORDER — LIDOCAINE 5 % EX PTCH
1.0000 | MEDICATED_PATCH | CUTANEOUS | Status: DC
  Administered 2016-05-17: 1 via TRANSDERMAL
  Filled 2016-05-17: qty 1

## 2016-05-17 MED ORDER — FENOFIBRATE 160 MG PO TABS
160.0000 mg | ORAL_TABLET | Freq: Every day | ORAL | Status: DC
  Administered 2016-05-17: 160 mg via ORAL
  Filled 2016-05-17: qty 1

## 2016-05-17 MED ORDER — APIXABAN 5 MG PO TABS: 5.0000 mg | ORAL_TABLET | Freq: Two times a day (BID) | ORAL | Status: DC

## 2016-05-17 MED ORDER — SODIUM CHLORIDE 0.9 % IV SOLN
INTRAVENOUS | Status: DC
  Administered 2016-05-17: 06:00:00 via INTRAVENOUS

## 2016-05-17 MED ORDER — TECHNETIUM TC 99M DIETHYLENETRIAME-PENTAACETIC ACID: 30.0000 | Freq: Once | INTRAVENOUS | Status: DC | PRN

## 2016-05-17 MED ORDER — ALPRAZOLAM 0.5 MG PO TABS
1.0000 mg | ORAL_TABLET | Freq: Two times a day (BID) | ORAL | Status: DC | PRN
Start: 2016-05-17 — End: 2016-05-17

## 2016-05-17 MED ORDER — TECHNETIUM TO 99M ALBUMIN AGGREGATED
4.0000 | Freq: Once | INTRAVENOUS | Status: AC | PRN
  Administered 2016-05-17: 4 via INTRAVENOUS

## 2016-05-17 MED ORDER — CLOPIDOGREL BISULFATE 75 MG PO TABS
75.0000 mg | ORAL_TABLET | Freq: Every day | ORAL | Status: DC
  Administered 2016-05-17: 75 mg via ORAL
  Filled 2016-05-17: qty 1

## 2016-05-17 MED ORDER — ACETAMINOPHEN 325 MG PO TABS: 650.0000 mg | ORAL_TABLET | Freq: Four times a day (QID) | ORAL | Status: DC | PRN

## 2016-05-17 MED ORDER — APIXABAN 5 MG PO TABS: ORAL_TABLET | ORAL | 0 refills | Status: DC

## 2016-05-17 NOTE — ED Notes (Signed)
Attempted to call report to [phone], receiving nurse busy and will return call.

## 2016-05-17 NOTE — Discharge Instructions (Signed)
Information on my medicine - ELIQUIS (apixaban)  This medication education was reviewed with me or my healthcare representative as part of my discharge preparation.  The pharmacist that spoke with me during my hospital stay was: [employer], Pharm.D and Martinique Gerber, PharmD Candidate  Why was Eliquis prescribed for you? Eliquis was prescribed to treat blood clots that may have been found in the veins of your legs (deep vein thrombosis) or in your lungs (pulmonary embolism) and to reduce the risk of them occurring again.  What do You need to know about Eliquis ? The starting dose is 10 mg (two 5 mg tablets) taken TWICE daily for the FIRST SEVEN (7) DAYS, then on 05/24/16  the dose is reduced to ONE 5 mg tablet taken TWICE daily.  Eliquis may be taken with or without food.   Try to take the dose about the same time in the morning and in the evening. If you have difficulty swallowing the tablet whole please discuss with your pharmacist how to take the medication safely.  Take Eliquis exactly as prescribed and DO NOT stop taking Eliquis without talking to the doctor who prescribed the medication.  Stopping may increase your risk of developing a new blood clot.  Refill your prescription before you run out.  After discharge, you should have regular check-up appointments with your healthcare provider that is prescribing your Eliquis.    What do you do if you miss a dose? If a dose of ELIQUIS is not taken at the scheduled time, take it as soon as possible on the same day and twice-daily administration should be resumed. The dose should not be doubled to make up for a missed dose.  Important Safety Information A possible side effect of Eliquis is bleeding. You should call your healthcare provider right away if you experience any of the following: ? Bleeding from an injury or your nose that does not stop. ? Unusual colored urine (red or dark brown) or unusual colored stools (red or  black). ? Unusual bruising for unknown reasons. ? A serious fall or if you hit your head (even if there is no bleeding).  Some medicines may interact with Eliquis and might increase your risk of bleeding or clotting while on Eliquis. To help avoid this, consult your healthcare provider or pharmacist prior to using any new prescription or non-prescription medications, including herbals, vitamins, non-steroidal anti-inflammatory drugs (NSAIDs) and supplements.  This website has more information on Eliquis (apixaban): http://www.eliquis.com/eliquis/home

## 2016-05-17 NOTE — Progress Notes (Signed)
ANTICOAGULATION CONSULT NOTE - Follow Up Consult  Pharmacy Consult for Heparin >> Eliquis Indication: DVT  Allergies  Allergen Reactions  . Amoxicillin Hives  . Aspirin Hives  . Codeine Hives  . Penicillins Hives    Patient Measurements: Height: 5\' 6"  (167.6 cm) Weight: 211 lb 8 oz (95.9 kg) IBW/kg (Calculated) : 59.3  Vital Signs: Temp: 98.1 F (36.7 C) (05/10 0633) Temp Source: Oral (05/10 5790) BP: 95/60 (05/10 3833) Pulse Rate: 108 (05/10 0633)  Labs:  Recent Labs  05/16/16 2122 05/17/16 0609 05/17/16 0843  HGB 14.6 13.2  --   HCT 43.6 40.8  --   PLT 314 297  --   HEPARINUNFRC  --   --  0.16*  CREATININE 1.50* 1.30*  --   TROPONINI <0.03 <0.03  --     Estimated Creatinine Clearance: 59.1 mL/min (A) (by C-G formula based on SCr of 1.3 mg/dL (H)).   Medications:  Scheduled:  . clopidogrel  75 mg Oral Daily  . fenofibrate  160 mg Oral Daily  . insulin aspart  0-9 Units Subcutaneous TID WC  . lidocaine  1 patch Transdermal Q24H  . PARoxetine  40 mg Oral Daily  . pravastatin  10 mg Oral Daily  . venlafaxine XR  75 mg Oral Daily   Infusions:  . sodium chloride 125 mL/hr at 05/17/16 0605  . heparin 1,400 Units/hr (05/16/16 2314)    Assessment: 51 yo F presented to ED 5/9 with new DVT.  Pt initially started on heparin infusion.  To convert to Eliquis today.  Pt reports hx of DVT/PE and taking Eliquis in the past.  Will restart and re-educate.  Goal of Therapy:  therapeutic anticoagulation Monitor platelets by anticoagulation protocol: Yes   Plan:  Discontinue heparin and associated labs. Start Eliquis 10mg  PO BID x 7 days, then Eliquis 5mg  BID thereafter. Monitor for signs and symptoms of bleeding. Patient education.  [employer], Pharm.D., BCPS Clinical Pharmacist Pager: [phone] Clinical phone for 05/17/2016 from 8:30-4:00 is [phone]. After 4pm, please call Main Rx [phone]) for assistance. 05/17/2016 10:31 AM

## 2016-05-17 NOTE — Discharge Summary (Signed)
Physician Discharge Summary  [name] JOA:[id number] DOB: Sep 06, 1965 DOA: 05/16/2016  PCP: Thressa Sheller, MD  Admit date: 05/16/2016 Discharge date: 05/17/2016  Admitted From: Home  Disposition:  Home   Recommendations for Outpatient Follow-up:  1. Follow up with PCP in 1 weeks 2. Please obtain BMP/CBC in one week  Discharge Condition: STABLE  CODE STATUS: FULL  Diet recommendation: Heart Healthy / Carb Modified   Brief/Interim Summary: HPI: [name] is a 51 y.o. female with previous history of DVT and PE, stroke diabetes mellitus and hypertension presents to the ER at [facility] with complaints of patients leg swelling and a popping sound after patient tripped on her dog yesterday morning. Denies any chest pain palpitation dizziness or shortness of breath.   ED Course: In the ER Dopplers revealed acute DVT involving the calf veins up to the common femoral vein. Patient has been persistently tachycardic with low normal blood pressure. Patient has chronic kidney disease so patient is being admitted for VQ scan and further management and has been started on heparin. On exam patient denies any chest pain or shortness of breath and is not hypoxic.  Pt did have VQ scan and no PE was reported.  Pt was counseled and elected to take eliquis which she reports she has taken in the past for DVT in the RLE.  She was counseled by MD and pharmacist.  She was prescribed to take xarelto 10 mg BID for 14 doses then take 5 mg po BID.  She should follow up with her PCP in 1 week for recheck.  Please check labs on follow up visit.  Pt called me and told me that she didn't want to stay in hospital any longer, her husband was going to take her home.  So I went ahead and discharged  Her with instructions to follow up and gave her a lot of written discharge instructions as well.  She was advised to return for worsening symptoms or any bleeding complications.   She verbalized understanding.     Discharge Diagnoses:  Principal Problem:   DVT (deep venous thrombosis) (HCC) Active Problems:   Chronic ischemic left PCA stroke   CKD (chronic kidney disease), stage III   Type 2 diabetes mellitus with vascular disease ([place])   Essential hypertension  Discharge Instructions  Discharge Instructions    Increase activity slowly    Complete by:  As directed      Allergies as of 05/17/2016      Reactions   Amoxicillin Hives   Aspirin Hives   Codeine Hives   Penicillins Hives   Has patient had a PCN reaction causing immediate rash, facial/tongue/throat swelling, SOB or lightheadedness with hypotension: Yes Has patient had a PCN reaction causing severe rash involving mucus membranes or skin necrosis: No Has patient had a PCN reaction that required hospitalization No Has patient had a PCN reaction occurring within the last 10 years: Yes If all of the above answers are "NO", then may proceed with Cephalosporin use.      Medication List    TAKE these medications   ALPRAZolam 1 MG tablet Commonly known as:  XANAX Take 1 mg by mouth 2 (two) times daily as needed for anxiety.   apixaban 5 MG Tabs tablet Commonly known as:  ELIQUIS Take 2 tabs po BID x 13 doses then take 1 tab po BID after that   clopidogrel 75 MG tablet Commonly known as:  PLAVIX Take 1 tablet (75 mg  total) by mouth daily.   cyclobenzaprine 10 MG tablet Commonly known as:  FLEXERIL Take 1 tablet (10 mg total) by mouth 2 (two) times daily as needed for muscle spasms.   dicyclomine 20 MG tablet Commonly known as:  BENTYL Take 1 tablet (20 mg total) by mouth 2 (two) times daily.   FARXIGA 10 MG Tabs tablet Generic drug:  dapagliflozin propanediol Take 10 mg by mouth daily.   fenofibrate 160 MG tablet Take 1 tablet (160 mg total) by mouth daily.   lidocaine 5 % Commonly known as:  LIDODERM Place 1 patch onto the skin daily. Remove & Discard patch within 12 hours or as directed by MD   lisinopril 20  MG tablet Commonly known as:  PRINIVIL,ZESTRIL Take 20 mg by mouth daily.   metFORMIN 500 MG 24 hr tablet Commonly known as:  GLUCOPHAGE-XR Take 1 tablet (500 mg total) by mouth daily with breakfast. What changed:  when to take this   ondansetron 4 MG tablet Commonly known as:  ZOFRAN Take 1 tablet (4 mg total) by mouth every 6 (six) hours as needed for nausea or vomiting.   PARoxetine 40 MG tablet Commonly known as:  PAXIL Take 40 mg by mouth every morning.   pravastatin 10 MG tablet Commonly known as:  PRAVACHOL Take 1 tablet by mouth daily.   SUMAtriptan 100 MG tablet Commonly known as:  IMITREX Take 100 mg by mouth daily as needed for migraine.   venlafaxine XR 75 MG 24 hr capsule Commonly known as:  EFFEXOR XR Take 1 capsule (75 mg total) by mouth daily with breakfast.      Follow-up Information    Thressa Sheller, MD. Schedule an appointment as soon as possible for a visit in 1 week(s).   Specialty:  Internal Medicine Why:  Hospital Follow Up  Contact information: [place], [address] [place] [address] [phone]          Allergies  Allergen Reactions  . Amoxicillin Hives  . Aspirin Hives  . Codeine Hives  . Penicillins Hives    Has patient had a PCN reaction causing immediate rash, facial/tongue/throat swelling, SOB or lightheadedness with hypotension: Yes Has patient had a PCN reaction causing severe rash involving mucus membranes or skin necrosis: No Has patient had a PCN reaction that required hospitalization No Has patient had a PCN reaction occurring within the last 10 years: Yes If all of the above answers are "NO", then may proceed with Cephalosporin use.    Procedures/Studies: Dg Chest 2 View  Result Date: 05/17/2016 CLINICAL DATA:  Cough for 3 days. EXAM: CHEST  2 VIEW COMPARISON:  05/16/2016 FINDINGS: The heart is normal in size and configuration. No mediastinal or hilar masses. There is no evidence of adenopathy. There is  mild interstitial thickening most evident in the bases, stable. There is no evidence of pneumonia or pulmonary edema. No pleural effusion or pneumothorax. A loop recorder lies in the left anterior chest subcutaneous soft tissues, stable. The skeletal structures are intact. IMPRESSION: No active cardiopulmonary disease. Electronically Signed   By: Lajean Manes M.D.   On: 05/17/2016 13:31   Dg Chest 2 View  Result Date: 05/16/2016 CLINICAL DATA:  Felt a pop in LEFT lower extremity.  Swelling. EXAM: CHEST  2 VIEW COMPARISON:  04/07/2015. FINDINGS: The heart size and mediastinal contours are within normal limits. Both lungs are clear. The visualized skeletal structures are unremarkable. Loop recorder unchanged. IMPRESSION: No active cardiopulmonary disease. Electronically Signed  By: Staci Righter M.D.   On: 05/16/2016 22:38   Nm Pulmonary Perf And Vent  Result Date: 05/17/2016 CLINICAL DATA:  Acute DVT. EXAM: NUCLEAR MEDICINE VENTILATION - PERFUSION LUNG SCAN TECHNIQUE: Ventilation images were obtained in multiple projections using inhaled aerosol Tc-36m DTPA. Perfusion images were obtained in multiple projections after intravenous injection of Tc-39m MAA. RADIOPHARMACEUTICALS:  30.8 mCi Technetium-21m DTPA aerosol inhalation and 4.2 mCi Technetium-29m MAA IV COMPARISON:  Chest radiograph yesterday and today. FINDINGS: Ventilation: No focal ventilation defect. Perfusion: No convincing wedge-shaped peripheral perfusion defects. A few areas of decreased perfusion on the lateral positioning do not correlate on additional views. IMPRESSION: Low probability for pulmonary embolus. Electronically Signed   By: Jeb Levering M.D.   On: 05/17/2016 16:34   US Venous Img Lower Unilateral Left  Result Date: 05/16/2016 CLINICAL DATA:  Pain and swelling in the left lower extremity today. Felt a pop earlier today. Previous history of DVT and pulmonary embolus. Tachycardia. EXAM: Left LOWER EXTREMITY VENOUS DOPPLER  ULTRASOUND TECHNIQUE: Gray-scale sonography with graded compression, as well as color Doppler and duplex ultrasound were performed to evaluate the lower extremity deep venous systems from the level of the common femoral vein and including the common femoral, femoral, profunda femoral, popliteal and calf veins including the posterior tibial, peroneal and gastrocnemius veins when visible. The superficial great saphenous vein was also interrogated. Spectral Doppler was utilized to evaluate flow at rest and with distal augmentation maneuvers in the common femoral, femoral and popliteal veins. COMPARISON:  None. FINDINGS: Contralateral Common Femoral Vein: Respiratory phasicity is normal and symmetric with the symptomatic side. No evidence of thrombus. Normal compressibility. Common Femoral Vein: Echogenic nonocclusive thrombus is demonstrated with decreased flow on color flow Doppler imaging. Saphenofemoral Junction: Echogenic nonocclusive thrombus is demonstrated decreased flow. Profunda Femoral Vein: Echogenic expansile nonocclusive thrombus is demonstrated with decreased flow. Femoral Vein: The duplicated femoral vein is present. Nonocclusive echogenic thrombus is demonstrated. Popliteal Vein: Echogenic occlusive expansile thrombus demonstrated throughout the popliteal vein. No flow on color flow Doppler imaging. Calf Veins: Thrombus is demonstrated within the posterior tibial vein. Peroneal vein is not visualized. Superficial Great Saphenous Vein: Superficial venous thrombosis is demonstrated. Venous Reflux:  None. Other Findings:  None. IMPRESSION: Examination is positive for acute deep venous thrombosis with nonocclusive involvement of the left common femoral, deep femoral, and femoral veins as well as occlusive thrombus in the popliteal vein and calf veins. These results were called by telephone at the time of interpretation on 05/16/2016 at 10:42 pm to Dr. Dorie Rank , who verbally acknowledged these results.  Electronically Signed   By: Lucienne Capers M.D.   On: 05/16/2016 22:44    (Echo, Carotid, EGD, Colonoscopy, ERCP)    Subjective: Pt wants to go home, does not want to stay in hospital any longer.    Discharge Exam: Vitals:   05/17/16 0633 05/17/16 1507  BP: 95/60 106/68  Pulse: (!) 108 (!) 112  Resp: 18 17  Temp: 98.1 F (36.7 C) 99.4 F (37.4 C)   Vitals:   05/17/16 0145 05/17/16 0633 05/17/16 0649 05/17/16 1507  BP: (!) 96/57 95/60  106/68  Pulse: (!) 113 (!) 108  (!) 112  Resp: 18 18  17   Temp: 98.9 F (37.2 C) 98.1 F (36.7 C)  99.4 F (37.4 C)  TempSrc: Oral Oral  Oral  SpO2: 97% 97%  95%  Weight:   95.9 kg (211 lb 8 oz)   Height:   5\' 6"  (1.676  m)     General: Pt is alert, awake, not in acute distress Cardiovascular: S1/S2 normal, no rubs, no gallops Respiratory: CTA bilaterally, no wheezing, no rhonchi Abdominal: Soft, NT, ND, bowel sounds + Extremities: swollen LLE  The results of significant diagnostics from this hospitalization (including imaging, microbiology, ancillary and laboratory) are listed below for reference.     Microbiology: No results found for this or any previous visit (from the past 240 hour(s)).   Labs: BNP (last 3 results)  Recent Labs  05/17/16 0609  BNP 15.1   Basic Metabolic Panel:  Recent Labs Lab 05/16/16 2122 05/17/16 0609  NA 133* 135  K 4.2 3.9  CL 101 104  CO2 20* 20*  GLUCOSE 205* 138*  BUN 16 13  CREATININE 1.50* 1.30*  CALCIUM 9.5 9.0   Liver Function Tests:  Recent Labs Lab 05/17/16 0609  AST 12*  ALT 12*  ALKPHOS 45  BILITOT 0.4  PROT 6.5  ALBUMIN 3.3*   No results for input(s): LIPASE, AMYLASE in the last 168 hours. No results for input(s): AMMONIA in the last 168 hours. CBC:  Recent Labs Lab 05/16/16 2122 05/17/16 0609  WBC 17.3* 13.8*  NEUTROABS 13.7* 7.4  HGB 14.6 13.2  HCT 43.6 40.8  MCV 93.8 94.0  PLT 314 297   Cardiac Enzymes:  Recent Labs Lab 05/16/16 2122  05/17/16 0609  TROPONINI <0.03 <0.03   BNP: Invalid input(s): POCBNP CBG:  Recent Labs Lab 05/17/16 0156 05/17/16 0744 05/17/16 1217 05/17/16 1703  GLUCAP 153* 132* 155* 172*   D-Dimer  Recent Labs  05/16/16 2122  DDIMER 3.29*   Hgb A1c No results for input(s): HGBA1C in the last 72 hours. Lipid Profile No results for input(s): CHOL, HDL, LDLCALC, TRIG, CHOLHDL, LDLDIRECT in the last 72 hours. Thyroid function studies No results for input(s): TSH, T4TOTAL, T3FREE, THYROIDAB in the last 72 hours.  Invalid input(s): FREET3 Anemia work up No results for input(s): VITAMINB12, FOLATE, FERRITIN, TIBC, IRON, RETICCTPCT in the last 72 hours. Urinalysis    Component Value Date/Time   COLORURINE YELLOW 06/14/2015 0120   APPEARANCEUR CLEAR 06/14/2015 0120   LABSPEC 1.008 06/14/2015 0120   PHURINE 6.0 06/14/2015 0120   GLUCOSEU >1000 (A) 06/14/2015 0120   GLUCOSEU NEGATIVE 05/29/2006 1222   HGBUR NEGATIVE 06/14/2015 0120   BILIRUBINUR NEGATIVE 06/14/2015 0120   KETONESUR NEGATIVE 06/14/2015 0120   PROTEINUR NEGATIVE 06/14/2015 0120   UROBILINOGEN 0.2 05/25/2014 1113   NITRITE NEGATIVE 06/14/2015 0120   LEUKOCYTESUR NEGATIVE 06/14/2015 0120   Sepsis Labs Invalid input(s): PROCALCITONIN,  WBC,  LACTICIDVEN Microbiology No results found for this or any previous visit (from the past 240 hour(s)).   Time coordinating discharge: Over 30 minutes  SIGNED:  Irwin Brakeman, MD  [facility] 05/17/2016, 5:23 PM Pager [phone]  If 7PM-7AM, please contact night-coverage [email or web address] Password [email or web address]

## 2016-05-17 NOTE — H&P (Signed)
History and Physical    [name] LSL:[id number] DOB: [birth year] DOA: 05/16/2016  PCP: Thressa Sheller, MD  Patient coming from: Home.  Chief Complaint: Left leg swelling.  HPI: [name] is a 51 y.o. female with previous history of DVT and PE, stroke diabetes mellitus and hypertension presents to the ER at [facility] with complaints of patients leg swelling and a popping sound after patient tripped on her dog yesterday morning. Denies any chest pain palpitation dizziness or shortness of breath.   ED Course: In the ER Dopplers revealed acute DVT involving the calf veins up to the common femoral vein. Patient has been persistently tachycardic with low normal blood pressure. Patient has chronic kidney disease so patient is being admitted for VQ scan and further management and has been started on heparin. On exam patient denies any chest pain or shortness of breath and is not hypoxic.  Review of Systems: As per HPI, rest all negative.   Past Medical History:  Diagnosis Date  . Acid reflux   . Adrenal benign tumor    Apparently enlarging and patient "is supposed to have it taken out"  . Anxiety   . Depression   . Diabetes mellitus without complication ([place])   . DVT (deep vein thrombosis) in pregnancy ([place])   . Hypertension   . PE (pulmonary embolism)   . Stroke [facility])     Past Surgical History:  Procedure Laterality Date  . LOOP RECORDER IMPLANT N/A 03/25/2014   Procedure: LOOP RECORDER IMPLANT;  Surgeon: [name], MD;  Location: [facility] CATH LAB;  Service: Cardiovascular;  Laterality: N/A;  . NO PAST SURGERIES    . TEE WITHOUT CARDIOVERSION N/A 03/25/2014   Procedure: TRANSESOPHAGEAL ECHOCARDIOGRAM (TEE);  Surgeon: [name], MD;  Location: [facility] ENDOSCOPY;  Service: Cardiovascular;  Laterality: N/A;     reports that she has been smoking Cigarettes.  She has a 40.00 pack-year smoking history. She has never used smokeless tobacco. She reports that she  does not drink alcohol or use drugs.  Allergies  Allergen Reactions  . Amoxicillin Hives  . Aspirin Hives  . Codeine Hives  . Penicillins Hives    Family History  Problem Relation Age of Onset  . Heart attack Father 40    Prior to Admission medications   Medication Sig Start Date End Date Taking? Authorizing Provider  ALPRAZolam [name]) 1 MG tablet Take 1 mg by mouth 2 (two) times daily as needed for anxiety.    [provider]  clopidogrel (PLAVIX) 75 MG tablet Take 1 tablet (75 mg total) by mouth daily. 07/31/13   [name], MD  cyclobenzaprine (FLEXERIL) 10 MG tablet Take 1 tablet (10 mg total) by mouth 2 (two) times daily as needed for muscle spasms. 11/26/15   [name], PA-C  dicyclomine (BENTYL) 20 MG tablet Take 1 tablet (20 mg total) by mouth 2 (two) times daily. 05/25/14   [name], PA-C  fenofibrate 160 MG tablet Take 1 tablet (160 mg total) by mouth daily. 07/31/13   [name], MD  lidocaine (LIDODERM) 5 % Place 1 patch onto the skin daily. Remove & Discard patch within 12 hours or as directed by MD 11/26/15   [name], PA-C  lisinopril (PRINIVIL,ZESTRIL) 20 MG tablet Take 20 mg by mouth daily.    [provider]  metFORMIN (GLUCOPHAGE-XR) 500 MG 24 hr tablet Take 1 tablet (500 mg total) by mouth daily with breakfast. Patient taking differently: Take 500  mg by mouth 4 (four) times daily.  10/24/12   Fransico Meadow, PA-C  ondansetron (ZOFRAN) 4 MG tablet Take 1 tablet (4 mg total) by mouth every 6 (six) hours as needed for nausea or vomiting. 09/[birth year]/49   [name], MD  PARoxetine (PAXIL) 40 MG tablet Take 40 mg by mouth every morning.    [provider]  pravastatin (PRAVACHOL) 10 MG tablet Take 1 tablet by mouth daily. 03/08/14   [provider]  venlafaxine XR (EFFEXOR XR) 75 MG 24 hr capsule Take 1 capsule (75 mg total) by mouth daily with breakfast. 07/15/15   [name], MD  venlafaxine XR (EFFEXOR-XR) 75 MG  24 hr capsule Take 1 capsule (75 mg total) by mouth daily. 11/04/15   [name], DO    Physical Exam: Vitals:   05/17/16 0016 05/17/16 0029 05/17/16 0030 05/17/16 0145  BP: 101/76  108/79 (!) 96/57  Pulse: (!) 121 (!) 120 (!) 121 (!) 113  Resp: 20 (!) 21 19 18   Temp:  98.8 F (37.1 C)  98.9 F (37.2 C)  TempSrc:  Oral  Oral  SpO2: 98% 97% 98% 97%  Weight:      Height:          Constitutional: Moderately built and nourished. Vitals:   05/17/16 0016 05/17/16 0029 05/17/16 0030 05/17/16 0145  BP: 101/76  108/79 (!) 96/57  Pulse: (!) 121 (!) 120 (!) 121 (!) 113  Resp: 20 (!) 21 19 18   Temp:  98.8 F (37.1 C)  98.9 F (37.2 C)  TempSrc:  Oral  Oral  SpO2: 98% 97% 98% 97%  Weight:      Height:       Eyes: Anicteric. No pallor. ENMT: No discharge from the ears eyes nose or mouth. Neck: No mass felt. No neck rigidity. No JVD appreciated. Respiratory: No rhonchi or crepitations. Cardiovascular: S1 and S2 heard no murmurs appreciated. Abdomen: Soft nontender bowel sounds present. Musculoskeletal: Left leg swelling extending from the ankle up to the thigh. Skin: No rash. Skin appears warm. Neurologic: Alert awake oriented to time place and person. Moves all extremities. Psychiatric: Appears normal.   Labs on Admission: I have personally reviewed following labs and imaging studies  CBC:  Recent Labs Lab 05/16/16 2122  WBC 17.3*  NEUTROABS 13.7*  HGB 14.6  HCT 43.6  MCV 93.8  PLT 761   Basic Metabolic Panel:  Recent Labs Lab 05/16/16 2122  NA 133*  K 4.2  CL 101  CO2 20*  GLUCOSE 205*  BUN 16  CREATININE 1.50*  CALCIUM 9.5   GFR: Estimated Creatinine Clearance: 51.3 mL/min (A) (by C-G formula based on SCr of 1.5 mg/dL (H)). Liver Function Tests: No results for input(s): AST, ALT, ALKPHOS, BILITOT, PROT, ALBUMIN in the last 168 hours. No results for input(s): LIPASE, AMYLASE in the last 168 hours. No results for input(s): AMMONIA in the last 168  hours. Coagulation Profile: No results for input(s): INR, PROTIME in the last 168 hours. Cardiac Enzymes:  Recent Labs Lab 05/16/16 2122  TROPONINI <0.03   BNP (last 3 results) No results for input(s): PROBNP in the last 8760 hours. HbA1C: No results for input(s): HGBA1C in the last 72 hours. CBG: No results for input(s): GLUCAP in the last 168 hours. Lipid Profile: No results for input(s): CHOL, HDL, LDLCALC, TRIG, CHOLHDL, LDLDIRECT in the last 72 hours. Thyroid Function Tests: No results for input(s): TSH, T4TOTAL, FREET4, T3FREE, THYROIDAB in the last 72 hours.  Anemia Panel: No results for input(s): VITAMINB12, FOLATE, FERRITIN, TIBC, IRON, RETICCTPCT in the last 72 hours. Urine analysis:    Component Value Date/Time   COLORURINE YELLOW 06/14/2015 0120   APPEARANCEUR CLEAR 06/14/2015 0120   LABSPEC 1.008 06/14/2015 0120   PHURINE 6.0 06/14/2015 0120   GLUCOSEU >1000 (A) 06/14/2015 0120   GLUCOSEU NEGATIVE 05/29/2006 1222   HGBUR NEGATIVE 06/14/2015 0120   BILIRUBINUR NEGATIVE 06/14/2015 0120   KETONESUR NEGATIVE 06/14/2015 0120   PROTEINUR NEGATIVE 06/14/2015 0120   UROBILINOGEN 0.2 05/25/2014 1113   NITRITE NEGATIVE 06/14/2015 0120   LEUKOCYTESUR NEGATIVE 06/14/2015 0120   Sepsis Labs: @LABRCNTIP (procalcitonin:4,lacticidven:4) )No results found for this or any previous visit (from the past 240 hour(s)).   Radiological Exams on Admission: Dg Chest 2 View  Result Date: 05/16/2016 CLINICAL DATA:  Felt a pop in LEFT lower extremity.  Swelling. EXAM: CHEST  2 VIEW COMPARISON:  04/07/2015. FINDINGS: The heart size and mediastinal contours are within normal limits. Both lungs are clear. The visualized skeletal structures are unremarkable. Loop recorder unchanged. IMPRESSION: No active cardiopulmonary disease. Electronically Signed   By: Staci Righter M.D.   On: 05/16/2016 22:38   US Venous Img Lower Unilateral Left  Result Date: 05/16/2016 CLINICAL DATA:  Pain and  swelling in the left lower extremity today. Felt a pop earlier today. Previous history of DVT and pulmonary embolus. Tachycardia. EXAM: Left LOWER EXTREMITY VENOUS DOPPLER ULTRASOUND TECHNIQUE: Gray-scale sonography with graded compression, as well as color Doppler and duplex ultrasound were performed to evaluate the lower extremity deep venous systems from the level of the common femoral vein and including the common femoral, femoral, profunda femoral, popliteal and calf veins including the posterior tibial, peroneal and gastrocnemius veins when visible. The superficial great saphenous vein was also interrogated. Spectral Doppler was utilized to evaluate flow at rest and with distal augmentation maneuvers in the common femoral, femoral and popliteal veins. COMPARISON:  None. FINDINGS: Contralateral Common Femoral Vein: Respiratory phasicity is normal and symmetric with the symptomatic side. No evidence of thrombus. Normal compressibility. Common Femoral Vein: Echogenic nonocclusive thrombus is demonstrated with decreased flow on color flow Doppler imaging. Saphenofemoral Junction: Echogenic nonocclusive thrombus is demonstrated decreased flow. Profunda Femoral Vein: Echogenic expansile nonocclusive thrombus is demonstrated with decreased flow. Femoral Vein: The duplicated femoral vein is present. Nonocclusive echogenic thrombus is demonstrated. Popliteal Vein: Echogenic occlusive expansile thrombus demonstrated throughout the popliteal vein. No flow on color flow Doppler imaging. Calf Veins: Thrombus is demonstrated within the posterior tibial vein. Peroneal vein is not visualized. Superficial Great Saphenous Vein: Superficial venous thrombosis is demonstrated. Venous Reflux:  None. Other Findings:  None. IMPRESSION: Examination is positive for acute deep venous thrombosis with nonocclusive involvement of the left common femoral, deep femoral, and femoral veins as well as occlusive thrombus in the popliteal vein  and calf veins. These results were called by telephone at the time of interpretation on 05/16/2016 at 10:42 pm to Dr. Dorie Rank , who verbally acknowledged these results. Electronically Signed   By: Lucienne Capers M.D.   On: 05/16/2016 22:44    EKG: Independently reviewed. Sinus tachycardia.  Assessment/Plan Principal Problem:   DVT (deep venous thrombosis) (HCC) Active Problems:   Chronic ischemic left PCA stroke   CKD (chronic kidney disease), stage III   Type 2 diabetes mellitus with vascular disease ([place])   Essential hypertension    1. Acute DVT of the left lower extremity with sinus tachycardia - patient is not hypoxic and not short  of breath. Did discuss with on-call pulmonologist. At this time plan is to get weak Q scan. Continue heparin. Will check lactate BNP and troponin. Check TSH. Continue with hydration. 2. History of stroke on Plavix and statins. 3. History of hypertension presently having low-normal blood pressure will hold off lisinopril. 4. Diabetes mellitus type 2 - will place patient on sliding-scale coverage and hold off metformin while inpatient. 5. History of depression on Paxil and Effexor.   DVT prophylaxis: Heparin. Code Status: Full code.  Family Communication: Husband at the bedside.  Disposition Plan: Home.  Consults called: None. Discussed with pulmonologist.  Admission status: Observation.    Rise Patience MD [facility] Pager [phone].  If 7PM-7AM, please contact night-coverage [email or web address] Password [email or web address]  05/17/2016, 6:03 AM

## 2016-05-17 NOTE — Progress Notes (Signed)
05/16/2016  8:28 PM  05/17/2016 2:24 PM  [name] was seen and examined.  The H&P by the admitting provider, orders, imaging was reviewed.  Please see orders.  Will continue to follow.  Pt decided to go back on eliquis after discussing various options of anticoagulation with her.  She has taken this in the past and would like to take this.  I asked the pharmacist to counsel her about this medication and review side effect profile and discuss potential adverse events.    Murvin Natal, MD [facility]

## 2016-05-17 NOTE — Progress Notes (Signed)
Rusty Aus to be D/C'd to home per MD order.  Discussed with the patient and all questions fully answered.  VSS, Skin clean, dry and intact without evidence of skin break down, no evidence of skin tears noted. IV catheter discontinued intact. Site without signs and symptoms of complications. Dressing and pressure applied.  An After Visit Summary was printed and given to the patient. Patient received prescription.  D/c education completed with patient/family including follow up instructions, medication list, d/c activities limitations if indicated, with other d/c instructions as indicated by MD - patient able to verbalize understanding, all questions fully answered.   Patient instructed to return to ED, call 911, or call MD for any changes in condition.   Patient escorted via [place], and D/C home via private auto.  Lynann Beaver 05/17/2016 6:41 PM

## 2016-05-17 NOTE — ED Notes (Signed)
[facility] here for transport. 

## 2016-05-17 NOTE — Progress Notes (Signed)
Placed call to [facility] at [place] [phone] for report on [name].Patient will be admitted with LLL DVT and probable PE, per RN. Awaiting arrival.

## 2016-05-17 NOTE — Progress Notes (Signed)
Texted admissions that patient [name] in 2C80 was here.

## 2016-05-18 ENCOUNTER — Encounter: Payer: Self-pay | Admitting: Cardiology

## 2016-05-24 ENCOUNTER — Encounter: Payer: Self-pay | Admitting: *Deleted

## 2016-05-24 NOTE — Telephone Encounter (Signed)
Letter mailed requesting that patient schedule Device Clinic appointment for ILR check/reprogramming.

## 2016-05-25 LAB — CUP PACEART REMOTE DEVICE CHECK
Implantable Pulse Generator Implant Date: 20160317
MDC IDC SESS DTM: 20180507020841

## 2016-05-26 ENCOUNTER — Other Ambulatory Visit: Payer: Self-pay | Admitting: Family Medicine

## 2016-05-26 DIAGNOSIS — N951 Menopausal and female climacteric states: Secondary | ICD-10-CM

## 2016-05-28 DIAGNOSIS — G43009 Migraine without aura, not intractable, without status migrainosus: Secondary | ICD-10-CM | POA: Diagnosis not present

## 2016-05-28 DIAGNOSIS — Z09 Encounter for follow-up examination after completed treatment for conditions other than malignant neoplasm: Secondary | ICD-10-CM | POA: Diagnosis not present

## 2016-05-28 DIAGNOSIS — Z86718 Personal history of other venous thrombosis and embolism: Secondary | ICD-10-CM | POA: Diagnosis not present

## 2016-05-28 DIAGNOSIS — E785 Hyperlipidemia, unspecified: Secondary | ICD-10-CM | POA: Diagnosis not present

## 2016-05-28 DIAGNOSIS — I129 Hypertensive chronic kidney disease with stage 1 through stage 4 chronic kidney disease, or unspecified chronic kidney disease: Secondary | ICD-10-CM | POA: Diagnosis not present

## 2016-05-28 DIAGNOSIS — E1122 Type 2 diabetes mellitus with diabetic chronic kidney disease: Secondary | ICD-10-CM | POA: Diagnosis not present

## 2016-05-30 ENCOUNTER — Encounter: Payer: Self-pay | Admitting: Cardiology

## 2016-06-12 ENCOUNTER — Ambulatory Visit (INDEPENDENT_AMBULATORY_CARE_PROVIDER_SITE_OTHER): Payer: BLUE CROSS/BLUE SHIELD | Admitting: *Deleted

## 2016-06-12 DIAGNOSIS — I6930 Unspecified sequelae of cerebral infarction: Secondary | ICD-10-CM

## 2016-06-13 NOTE — Progress Notes (Signed)
Carelink Summary Report / Loop Recorder 

## 2016-06-15 LAB — CUP PACEART REMOTE DEVICE CHECK
Date Time Interrogation Session: 20180606021612
MDC IDC PG IMPLANT DT: 20160317

## 2016-06-15 NOTE — Progress Notes (Signed)
[facility] summary report received. Battery status OK. Normal device function. No new symptom episodes orbrady episodes. 1 AF- 0%- no ECG. 16 tachy- 1 w/ ECG appears TWOS. 18 pause- No ECGs for review. Per note, trying to contact pt to come in for reprogramming for TWOS. Monthly summary reports and ROV/PRN

## 2016-06-28 ENCOUNTER — Telehealth: Payer: Self-pay | Admitting: Cardiology

## 2016-06-28 NOTE — Telephone Encounter (Signed)
LMOVM requesting that pt send manual transmission b/c home monitor has not updated in at least 14 days.    

## 2016-07-04 ENCOUNTER — Encounter (HOSPITAL_BASED_OUTPATIENT_CLINIC_OR_DEPARTMENT_OTHER): Payer: Self-pay

## 2016-07-04 ENCOUNTER — Emergency Department (HOSPITAL_BASED_OUTPATIENT_CLINIC_OR_DEPARTMENT_OTHER)
Admission: EM | Admit: 2016-07-04 | Discharge: 2016-07-04 | Disposition: A | Discharge status: Home / Self Care | Payer: BLUE CROSS/BLUE SHIELD | Attending: Emergency Medicine | Admitting: Emergency Medicine

## 2016-07-04 DIAGNOSIS — Z885 Allergy status to narcotic agent status: Secondary | ICD-10-CM | POA: Insufficient documentation

## 2016-07-04 DIAGNOSIS — Z7984 Long term (current) use of oral hypoglycemic drugs: Secondary | ICD-10-CM | POA: Diagnosis not present

## 2016-07-04 DIAGNOSIS — E119 Type 2 diabetes mellitus without complications: Secondary | ICD-10-CM | POA: Insufficient documentation

## 2016-07-04 DIAGNOSIS — J039 Acute tonsillitis, unspecified: Secondary | ICD-10-CM | POA: Insufficient documentation

## 2016-07-04 DIAGNOSIS — I10 Essential (primary) hypertension: Secondary | ICD-10-CM | POA: Diagnosis not present

## 2016-07-04 DIAGNOSIS — Z86711 Personal history of pulmonary embolism: Secondary | ICD-10-CM | POA: Insufficient documentation

## 2016-07-04 DIAGNOSIS — Z7902 Long term (current) use of antithrombotics/antiplatelets: Secondary | ICD-10-CM | POA: Diagnosis not present

## 2016-07-04 DIAGNOSIS — J029 Acute pharyngitis, unspecified: Secondary | ICD-10-CM | POA: Diagnosis present

## 2016-07-04 DIAGNOSIS — F17210 Nicotine dependence, cigarettes, uncomplicated: Secondary | ICD-10-CM | POA: Diagnosis not present

## 2016-07-04 DIAGNOSIS — Z880 Allergy status to penicillin: Secondary | ICD-10-CM | POA: Diagnosis not present

## 2016-07-04 DIAGNOSIS — Z8673 Personal history of transient ischemic attack (TIA), and cerebral infarction without residual deficits: Secondary | ICD-10-CM | POA: Diagnosis not present

## 2016-07-04 DIAGNOSIS — Z7901 Long term (current) use of anticoagulants: Secondary | ICD-10-CM | POA: Insufficient documentation

## 2016-07-04 LAB — RAPID STREP SCREEN (MED CTR MEBANE ONLY): Streptococcus, Group A Screen (Direct): NEGATIVE

## 2016-07-04 MED ORDER — AZITHROMYCIN 250 MG PO TABS: 500.0000 mg | ORAL_TABLET | Freq: Every day | ORAL | 0 refills | Status: AC

## 2016-07-04 NOTE — ED Provider Notes (Signed)
[facility] DEPT [facility] Provider Note: [name], MD, FACEP  CSN: [id number] MRN: [id number] ARRIVAL: 07/04/16 at [place]: [place] Throat   HISTORY OF PRESENT ILLNESS  [name] is a 51 y.o. female with a one-week history of a sore throat. Her husband was diagnosed with strep throat yesterday with a positive strep test. She rates her pain as a 10 out of 10, worse with swallowing. She states her tonsils are enlarged and erythematous with white patches. She has had a subjective fever. She has been taking Tylenol and NyQuil for her symptoms. She is not having difficulty breathing.   Past Medical History:  Diagnosis Date  . Acid reflux   . Adrenal benign tumor    Apparently enlarging and patient "is supposed to have it taken out"  . Anxiety   . Depression   . Diabetes mellitus without complication ([place])   . DVT (deep vein thrombosis) in pregnancy ([place])   . Hypertension   . PE (pulmonary embolism)   . Stroke [facility])     Past Surgical History:  Procedure Laterality Date  . LOOP RECORDER IMPLANT N/A 03/25/2014   Procedure: LOOP RECORDER IMPLANT;  Surgeon: [name], MD;  Location: [facility] CATH LAB;  Service: Cardiovascular;  Laterality: N/A;  . NO PAST SURGERIES    . TEE WITHOUT CARDIOVERSION N/A 03/25/2014   Procedure: TRANSESOPHAGEAL ECHOCARDIOGRAM (TEE);  Surgeon: [name], MD;  Location: [facility] ENDOSCOPY;  Service: Cardiovascular;  Laterality: N/A;    Family History  Problem Relation Age of Onset  . Heart attack Father 19    Social History  Substance Use Topics  . Smoking status: Current Every Day Smoker    Packs/day: 1.00    Years: 40.00    Types: Cigarettes  . Smokeless tobacco: Never Used  . Alcohol use No    Prior to Admission medications   Medication Sig Start Date End Date Taking? Authorizing Provider  ALPRAZolam [name]) 1 MG tablet Take 1 mg by mouth 2 (two) times daily as needed for anxiety.    [provider]  apixaban (ELIQUIS) 5 MG TABS tablet Take 2 tabs po BID x 13 doses then take 1 tab po BID after that 05/17/16   Irwin Brakeman L, MD  clopidogrel (PLAVIX) 75 MG tablet Take 1 tablet (75 mg total) by mouth daily. 07/31/13   [name], MD  cyclobenzaprine (FLEXERIL) 10 MG tablet Take 1 tablet (10 mg total) by mouth 2 (two) times daily as needed for muscle spasms. 11/26/15   [name], PA-C  dicyclomine (BENTYL) 20 MG tablet Take 1 tablet (20 mg total) by mouth 2 (two) times daily. 05/25/14   Leeroy Cha O, PA-C  FARXIGA 10 MG TABS tablet Take 10 mg by mouth daily. 03/14/16   [provider]  fenofibrate 160 MG tablet Take 1 tablet (160 mg total) by mouth daily. 07/31/13   [name], MD  lidocaine (LIDODERM) 5 % Place 1 patch onto the skin daily. Remove & Discard patch within 12 hours or as directed by MD 11/26/15   [name], PA-C  lisinopril (PRINIVIL,ZESTRIL) 20 MG tablet Take 20 mg by mouth daily.    [provider]  metFORMIN (GLUCOPHAGE-XR) 500 MG 24 hr tablet Take 1 tablet (500 mg total) by mouth daily with breakfast. Patient taking differently: Take 500 mg by mouth 4 (four) times daily.  10/24/12   Fransico Meadow, PA-C  ondansetron (ZOFRAN) 4 MG tablet  Take 1 tablet (4 mg total) by mouth every 6 (six) hours as needed for nausea or vomiting. 7/51/02   [name], MD  PARoxetine (PAXIL) 40 MG tablet Take 40 mg by mouth every morning.    [provider]  pravastatin (PRAVACHOL) 10 MG tablet Take 1 tablet by mouth daily. 03/08/14   [provider]  SUMAtriptan (IMITREX) 100 MG tablet Take 100 mg by mouth daily as needed for migraine. 05/08/16   [provider]  venlafaxine XR (EFFEXOR XR) 75 MG 24 hr capsule Take 1 capsule (75 mg total) by mouth daily with breakfast. 07/15/15   [name], MD  venlafaxine XR (EFFEXOR-XR) 75 MG 24 hr capsule TAKE 1 CAPSULE(75 MG) BY MOUTH DAILY 05/28/16   [name], DO     Allergies Amoxicillin; Aspirin; Codeine; and Penicillins   REVIEW OF SYSTEMS  Negative except as noted here or in the History of Present Illness.   PHYSICAL EXAMINATION  Initial Vital Signs Blood pressure 111/75, pulse (!) 114, temperature 98.3 F (36.8 C), temperature source Oral, resp. rate 20, height 5\' 6"  (1.676 m), weight 117.9 kg (260 lb), SpO2 100 %.  Examination General: Well-developed, well-nourished female in no acute distress; appearance consistent with age of record HENT: normocephalic; atraumatic; tonsillar enlargement and mild erythema without exudate Eyes: pupils equal, round and reactive to light; extraocular muscles intact Neck: supple; no lymphadenopathy Heart: regular rate and rhythm Lungs: clear to auscultation bilaterally Abdomen: soft; nondistended; nontender; bowel sounds present Extremities: No deformity; full range of motion; pulses normal Neurologic: Awake, alert and oriented; motor function intact in all extremities and symmetric; no facial droop Skin: Warm and dry Psychiatric: Normal mood and affect   RESULTS  Summary of this visit's results, reviewed by myself:   EKG Interpretation  Date/Time:    Ventricular Rate:    PR Interval:    QRS Duration:   QT Interval:    QTC Calculation:   R Axis:     Text Interpretation:        Laboratory Studies: Results for orders placed or performed during the hospital encounter of 07/04/16 (from the past 24 hour(s))  Rapid strep screen     Status: None   Collection Time: 07/04/16  2:00 AM  Result Value Ref Range   Streptococcus, Group A Screen (Direct) NEGATIVE NEGATIVE   Imaging Studies: No results found.  ED COURSE  Nursing notes and initial vitals signs, including pulse oximetry, reviewed.  Vitals:   07/04/16 0156 07/04/16 0157  BP: 111/75   Pulse: (!) 114   Resp: 20   Temp: 98.3 F (36.8 C)   TempSrc: Oral   SpO2: 100%   Weight:  117.9 kg (260 lb)  Height:  5\' 6"  (1.676 m)     PROCEDURES    ED DIAGNOSES     ICD-10-CM   1. Tonsillitis J03.90        Sobia Karger, Jenny Reichmann, MD 07/04/16 [phone]

## 2016-07-04 NOTE — ED Triage Notes (Signed)
Pt c/o sore throat for a week with subjective fever, multiple exposures to strep throat, last dose of tylenol was this morning

## 2016-07-05 ENCOUNTER — Encounter: Payer: Self-pay | Admitting: Cardiology

## 2016-07-05 LAB — CULTURE, GROUP A STREP (THRC)

## 2016-07-06 ENCOUNTER — Telehealth: Payer: Self-pay | Admitting: *Deleted

## 2016-07-06 NOTE — Telephone Encounter (Signed)
Post ED Visit - Positive Culture Follow-up  Culture report reviewed by antimicrobial stewardship pharmacist:  []  Elenor Quinones, Pharm.D. []  Heide Guile, Pharm.D., BCPS AQ-ID [x]  Parks Neptune, Pharm.D., BCPS []  Alycia Rossetti, Pharm.D., BCPS []  [place], [place].D., BCPS, AAHIVP []  Legrand Como, Pharm.D., BCPS, AAHIVP []  Salome Arnt, PharmD, BCPS []  Dimitri Ped, PharmD, BCPS []  Vincenza Hews, PharmD, BCPS  Positive throat culture Treated with Azithromycin, organism sensitive to the same and no further patient follow-up is required at this time.  Harlon Flor [facility] 07/06/2016, 10:55 AM

## 2016-07-12 ENCOUNTER — Ambulatory Visit (INDEPENDENT_AMBULATORY_CARE_PROVIDER_SITE_OTHER): Payer: BLUE CROSS/BLUE SHIELD | Admitting: *Deleted

## 2016-07-12 DIAGNOSIS — I6930 Unspecified sequelae of cerebral infarction: Secondary | ICD-10-CM

## 2016-07-13 NOTE — Progress Notes (Signed)
Carelink Summary Report / Loop Recorder 

## 2016-07-19 LAB — CUP PACEART REMOTE DEVICE CHECK
Date Time Interrogation Session: 20180706023952
MDC IDC PG IMPLANT DT: 20160317

## 2016-07-24 ENCOUNTER — Encounter: Payer: Self-pay | Admitting: Internal Medicine

## 2016-08-03 ENCOUNTER — Telehealth: Payer: Self-pay | Admitting: *Deleted

## 2016-08-03 NOTE — Telephone Encounter (Signed)
Left detailed message on cel hone (DPR) regarding sending manual transmission. 4 tachy episodes, no available ECGs. [facility] phone number to call back if further concerns or questions.

## 2016-08-09 DIAGNOSIS — F419 Anxiety disorder, unspecified: Secondary | ICD-10-CM | POA: Diagnosis not present

## 2016-08-09 DIAGNOSIS — M542 Cervicalgia: Secondary | ICD-10-CM | POA: Diagnosis not present

## 2016-08-09 DIAGNOSIS — M79662 Pain in left lower leg: Secondary | ICD-10-CM | POA: Diagnosis not present

## 2016-08-10 NOTE — Telephone Encounter (Signed)
LMOVM (DPR) requesting manual transmission for review.  Gave direct phone number for any questions/concerns.

## 2016-08-13 ENCOUNTER — Ambulatory Visit (INDEPENDENT_AMBULATORY_CARE_PROVIDER_SITE_OTHER): Payer: BLUE CROSS/BLUE SHIELD | Admitting: *Deleted

## 2016-08-13 DIAGNOSIS — I6930 Unspecified sequelae of cerebral infarction: Secondary | ICD-10-CM | POA: Diagnosis not present

## 2016-08-14 NOTE — Progress Notes (Signed)
Carelink Summary Report / Loop Recorder 

## 2016-08-15 NOTE — Telephone Encounter (Signed)
LMOVM (DPR) requesting manual transmission for review. Gave device clinic phone number for any questions or concerns.

## 2016-08-20 NOTE — Telephone Encounter (Signed)
Unable to reach patient by phone x3.  Previous tachy episodes have all shown TWOS.  Encounter closed.

## 2016-08-22 LAB — CUP PACEART REMOTE DEVICE CHECK
Implantable Pulse Generator Implant Date: 20160317
MDC IDC SESS DTM: 20180805034129

## 2016-09-11 ENCOUNTER — Ambulatory Visit (INDEPENDENT_AMBULATORY_CARE_PROVIDER_SITE_OTHER): Payer: BLUE CROSS/BLUE SHIELD | Admitting: *Deleted

## 2016-09-11 DIAGNOSIS — I6930 Unspecified sequelae of cerebral infarction: Secondary | ICD-10-CM

## 2016-09-13 NOTE — Progress Notes (Signed)
Carelink Summary Report / Loop Recorder 

## 2016-09-17 LAB — CUP PACEART REMOTE DEVICE CHECK
MDC IDC PG IMPLANT DT: 20160317
MDC IDC SESS DTM: 20180904064909

## 2016-10-10 ENCOUNTER — Telehealth: Payer: Self-pay | Admitting: Cardiology

## 2016-10-10 NOTE — Telephone Encounter (Signed)
LMOVM requesting that pt send manual transmission b/c home monitor has not updated in at least 14 days.    

## 2016-10-11 ENCOUNTER — Ambulatory Visit (INDEPENDENT_AMBULATORY_CARE_PROVIDER_SITE_OTHER): Payer: BLUE CROSS/BLUE SHIELD | Admitting: *Deleted

## 2016-10-11 DIAGNOSIS — I6930 Unspecified sequelae of cerebral infarction: Secondary | ICD-10-CM

## 2016-10-11 NOTE — Progress Notes (Signed)
Carelink Summary Report / Loop Recorder 

## 2016-10-12 LAB — CUP PACEART REMOTE DEVICE CHECK
Implantable Pulse Generator Implant Date: 20160317
MDC IDC SESS DTM: 20181004104022

## 2016-10-18 ENCOUNTER — Encounter: Payer: Self-pay | Admitting: Cardiology

## 2016-11-12 ENCOUNTER — Ambulatory Visit (INDEPENDENT_AMBULATORY_CARE_PROVIDER_SITE_OTHER): Payer: BLUE CROSS/BLUE SHIELD | Admitting: *Deleted

## 2016-11-12 DIAGNOSIS — I6930 Unspecified sequelae of cerebral infarction: Secondary | ICD-10-CM

## 2016-11-13 NOTE — Progress Notes (Signed)
Carelink Summary Report / Loop Recorder 

## 2016-11-15 LAB — CUP PACEART REMOTE DEVICE CHECK
Date Time Interrogation Session: 20181103161204
Implantable Pulse Generator Implant Date: 20160317

## 2016-11-16 ENCOUNTER — Other Ambulatory Visit: Payer: Self-pay | Admitting: Internal Medicine

## 2016-11-22 ENCOUNTER — Telehealth: Payer: Self-pay | Admitting: *Deleted

## 2016-11-22 NOTE — Telephone Encounter (Signed)
Left detailed message (DPR) regarding LINQ at [facility] since 11/18/2016. [facility] phone number to call back if she would like to schedule an appointment with Dr. Caryl Comes to discuss LINQ removal. Advised a return kit for [facility] monitor will be sent to her home.

## 2016-12-05 NOTE — Telephone Encounter (Signed)
LMOVM requesting call back if patient wishes to discuss LINQ at [facility]RT.

## 2017-01-16 DIAGNOSIS — M25512 Pain in left shoulder: Secondary | ICD-10-CM | POA: Diagnosis not present

## 2017-01-16 DIAGNOSIS — Z0001 Encounter for general adult medical examination with abnormal findings: Secondary | ICD-10-CM | POA: Diagnosis not present

## 2017-01-25 ENCOUNTER — Other Ambulatory Visit: Payer: Self-pay

## 2017-01-25 ENCOUNTER — Encounter (HOSPITAL_BASED_OUTPATIENT_CLINIC_OR_DEPARTMENT_OTHER): Payer: Self-pay

## 2017-01-25 ENCOUNTER — Emergency Department (HOSPITAL_BASED_OUTPATIENT_CLINIC_OR_DEPARTMENT_OTHER)
Admission: EM | Admit: 2017-01-25 | Discharge: 2017-01-25 | Disposition: A | Discharge status: Home / Self Care | Payer: BLUE CROSS/BLUE SHIELD | Attending: Emergency Medicine | Admitting: Emergency Medicine

## 2017-01-25 DIAGNOSIS — F17210 Nicotine dependence, cigarettes, uncomplicated: Secondary | ICD-10-CM | POA: Insufficient documentation

## 2017-01-25 DIAGNOSIS — R42 Dizziness and giddiness: Secondary | ICD-10-CM | POA: Insufficient documentation

## 2017-01-25 DIAGNOSIS — Z7902 Long term (current) use of antithrombotics/antiplatelets: Secondary | ICD-10-CM | POA: Diagnosis not present

## 2017-01-25 DIAGNOSIS — Z8673 Personal history of transient ischemic attack (TIA), and cerebral infarction without residual deficits: Secondary | ICD-10-CM | POA: Insufficient documentation

## 2017-01-25 DIAGNOSIS — Z7984 Long term (current) use of oral hypoglycemic drugs: Secondary | ICD-10-CM | POA: Insufficient documentation

## 2017-01-25 DIAGNOSIS — I129 Hypertensive chronic kidney disease with stage 1 through stage 4 chronic kidney disease, or unspecified chronic kidney disease: Secondary | ICD-10-CM | POA: Insufficient documentation

## 2017-01-25 DIAGNOSIS — R000 Tachycardia, unspecified: Secondary | ICD-10-CM | POA: Diagnosis not present

## 2017-01-25 DIAGNOSIS — N183 Chronic kidney disease, stage 3 (moderate): Secondary | ICD-10-CM | POA: Insufficient documentation

## 2017-01-25 DIAGNOSIS — Z79899 Other long term (current) drug therapy: Secondary | ICD-10-CM | POA: Insufficient documentation

## 2017-01-25 DIAGNOSIS — Z7901 Long term (current) use of anticoagulants: Secondary | ICD-10-CM | POA: Diagnosis not present

## 2017-01-25 DIAGNOSIS — E1122 Type 2 diabetes mellitus with diabetic chronic kidney disease: Secondary | ICD-10-CM | POA: Diagnosis not present

## 2017-01-25 DIAGNOSIS — R112 Nausea with vomiting, unspecified: Secondary | ICD-10-CM | POA: Insufficient documentation

## 2017-01-25 DIAGNOSIS — E860 Dehydration: Secondary | ICD-10-CM | POA: Diagnosis not present

## 2017-01-25 LAB — CBC
HCT: 45.6 % (ref 36.0–46.0)
Hemoglobin: 14.8 g/dL (ref 12.0–15.0)
MCH: 30.8 pg (ref 26.0–34.0)
MCHC: 32.5 g/dL (ref 30.0–36.0)
MCV: 95 fL (ref 78.0–100.0)
PLATELETS: 265 10*3/uL (ref 150–400)
RBC: 4.8 MIL/uL (ref 3.87–5.11)
RDW: 15.2 % (ref 11.5–15.5)
WBC: 12.2 10*3/uL — ABNORMAL HIGH (ref 4.0–10.5)

## 2017-01-25 LAB — COMPREHENSIVE METABOLIC PANEL
ALT: 23 U/L (ref 14–54)
ANION GAP: 10 (ref 5–15)
AST: 22 U/L (ref 15–41)
Albumin: 4 g/dL (ref 3.5–5.0)
Alkaline Phosphatase: 48 U/L (ref 38–126)
BUN: 14 mg/dL (ref 6–20)
CHLORIDE: 104 mmol/L (ref 101–111)
CO2: 21 mmol/L — AB (ref 22–32)
CREATININE: 1.36 mg/dL — AB (ref 0.44–1.00)
Calcium: 9.1 mg/dL (ref 8.9–10.3)
GFR, EST AFRICAN AMERICAN: 51 mL/min — AB (ref >60)
GFR, EST NON AFRICAN AMERICAN: 44 mL/min — AB (ref >60)
Glucose, Bld: 172 mg/dL — ABNORMAL HIGH (ref 65–99)
POTASSIUM: 4.1 mmol/L (ref 3.5–5.1)
SODIUM: 135 mmol/L (ref 135–145)
Total Bilirubin: 0.6 mg/dL (ref 0.3–1.2)
Total Protein: 7.3 g/dL (ref 6.5–8.1)

## 2017-01-25 LAB — CBG MONITORING, ED: GLUCOSE-CAPILLARY: 151 mg/dL — AB (ref 65–99)

## 2017-01-25 MED ORDER — ONDANSETRON HCL 8 MG PO TABS
4.0000 mg | ORAL_TABLET | Freq: Once | ORAL | Status: AC
Start: 2017-01-25 — End: 2017-01-25
  Administered 2017-01-25: 4 mg via ORAL
  Filled 2017-01-25: qty 1

## 2017-01-25 MED ORDER — ONDANSETRON HCL 4 MG PO TABS: 4.0000 mg | ORAL_TABLET | Freq: Every day | ORAL | 0 refills | Status: DC | PRN

## 2017-01-25 MED ORDER — SODIUM CHLORIDE 0.9 % IV BOLUS (SEPSIS)
1000.0000 mL | Freq: Once | INTRAVENOUS | Status: AC
  Administered 2017-01-25: 1000 mL via INTRAVENOUS

## 2017-01-25 MED ORDER — ACETAMINOPHEN 325 MG PO TABS
650.0000 mg | ORAL_TABLET | Freq: Once | ORAL | Status: AC
  Administered 2017-01-25: 650 mg via ORAL
  Filled 2017-01-25: qty 2

## 2017-01-25 MED FILL — ONDANSETRON HCL 4 MG TABLET: 4 | 2 days supply | Qty: 5 | Fill #0

## 2017-01-25 NOTE — ED Notes (Signed)
Patient disconnected by MD and sent to the restroom on own. Patient tolerated well

## 2017-01-25 NOTE — ED Triage Notes (Signed)
Pt c/o dizziness x 1 hour PTA-states she has been vomiting since early am-HA x 3-4 days-NAD-presents to triage in w/c

## 2017-01-25 NOTE — Discharge Instructions (Signed)
You were seen and evaluated in the emergency department for light headedness. This was most likely because of dehydration with vomiting.   Medication for nausea was sent to your pharmacy. Please continue to take sips of fluids to stay hydrated at home. Reasons to return to care would be if you

## 2017-01-25 NOTE — ED Provider Notes (Signed)
[facility] EMERGENCY DEPARTMENT Provider Note   CSN: [id number] Arrival date & time: 01/25/17  1411     History   Chief Complaint Chief Complaint  Patient presents with  . Dizziness    HPI [name] is a 52 y.o. female with a history of CVA, Hx of PE on Eliquis, T2DM, CKD3, presenting with light headedness that began today around 61 PM. She reports that she was in her usual state of health until waking this AM when developed many episodes of NBNB vomiting. No diarrhea. Then around noon she began to feel light headedness as though she would pass out, but denies LOC. No palpitations, no chest pain, no paresthesias or weakness. Dizziness persisted until a few moments ago in the emergency department, but she continues to feel nauseous. She ate [employer] for dinner last night. No fevers or diarrhea. She does endorse some rhinorrhea, congestion over the past few days. No medication changes. She does not take insulin, only metformin.   Past Medical History:  Diagnosis Date  . Acid reflux   . Adrenal benign tumor    Apparently enlarging and patient "is supposed to have it taken out"  . Anxiety   . Depression   . Diabetes mellitus without complication ([place])   . DVT (deep vein thrombosis) in pregnancy ([place])   . Hypertension   . PE (pulmonary embolism)   . Stroke [facility])     Patient Active Problem List   Diagnosis Date Noted  . CKD (chronic kidney disease), stage III ([place]) 05/17/2016  . Type 2 diabetes mellitus with vascular disease ([place]) 05/17/2016  . Essential hypertension 05/17/2016  . DVT (deep venous thrombosis) ([place]) 05/16/2016  . Chronic ischemic left PCA stroke 07/30/2013  . TIA (transient ischemic attack) 07/30/2013  . Posterior circulation stroke of uncertain pathology ([place]) 07/30/2013    Past Surgical History:  Procedure Laterality Date  . LOOP RECORDER IMPLANT N/A 03/25/2014   Procedure: LOOP RECORDER IMPLANT;  Surgeon: [name], MD;  Location:  [facility] CATH LAB;  Service: Cardiovascular;  Laterality: N/A;  . NO PAST SURGERIES    . TEE WITHOUT CARDIOVERSION N/A 03/25/2014   Procedure: TRANSESOPHAGEAL ECHOCARDIOGRAM (TEE);  Surgeon: [name], MD;  Location: [facility] ENDOSCOPY;  Service: Cardiovascular;  Laterality: N/A;    OB History    Gravida Para Term Preterm AB Living   2 2 2  0 0 2   SAB TAB Ectopic Multiple Live Births   0 0 0 0         Home Medications    Prior to Admission medications   Medication Sig Start Date End Date Taking? Authorizing Provider  ALPRAZolam [name]) 1 MG tablet Take 1 mg by mouth 2 (two) times daily as needed for anxiety.    [provider]  apixaban (ELIQUIS) 5 MG TABS tablet Take 2 tabs po BID x 13 doses then take 1 tab po BID after that 05/17/16   Irwin Brakeman L, MD  clopidogrel (PLAVIX) 75 MG tablet Take 1 tablet (75 mg total) by mouth daily. 07/31/13   [name], MD  cyclobenzaprine (FLEXERIL) 10 MG tablet Take 1 tablet (10 mg total) by mouth 2 (two) times daily as needed for muscle spasms. 11/26/15   [name], PA-C  dicyclomine (BENTYL) 20 MG tablet Take 1 tablet (20 mg total) by mouth 2 (two) times daily. 05/25/14   Leeroy Cha O, PA-C  FARXIGA 10 MG TABS tablet Take 10 mg by mouth daily. 03/14/16  [provider]  fenofibrate 160 MG tablet Take 1 tablet (160 mg total) by mouth daily. 07/31/13   [name], MD  lidocaine (LIDODERM) 5 % Place 1 patch onto the skin daily. Remove & Discard patch within 12 hours or as directed by MD 11/26/15   [name], PA-C  lisinopril (PRINIVIL,ZESTRIL) 20 MG tablet Take 20 mg by mouth daily.    [provider]  metFORMIN (GLUCOPHAGE-XR) 500 MG 24 hr tablet Take 1 tablet (500 mg total) by mouth daily with breakfast. Patient taking differently: Take 500 mg by mouth 4 (four) times daily.  10/24/12   Fransico Meadow, PA-C  ondansetron (ZOFRAN) 4 MG tablet Take 1 tablet (4 mg total) by mouth daily as needed for nausea or vomiting.  01/25/17   Everrett Coombe, MD  PARoxetine (PAXIL) 40 MG tablet Take 40 mg by mouth every morning.    [provider]  pravastatin (PRAVACHOL) 10 MG tablet Take 1 tablet by mouth daily. 03/08/14   [provider]  SUMAtriptan (IMITREX) 100 MG tablet Take 100 mg by mouth daily as needed for migraine. 05/08/16   [provider]  venlafaxine XR (EFFEXOR XR) 75 MG 24 hr capsule Take 1 capsule (75 mg total) by mouth daily with breakfast. 07/15/15   [name], MD  venlafaxine XR (EFFEXOR-XR) 75 MG 24 hr capsule TAKE 1 CAPSULE(75 MG) BY MOUTH DAILY 05/28/16   [name], DO    Family History Family History  Problem Relation Age of Onset  . Heart attack Father 20    Social History Social History   Tobacco Use  . Smoking status: Current Every Day Smoker    Packs/day: 1.00    Years: 40.00    Pack years: 40.00    Types: Cigarettes  . Smokeless tobacco: Never Used  Substance Use Topics  . Alcohol use: No    Alcohol/week: 0.0 oz  . Drug use: No     Allergies   Amoxicillin; Aspirin; Codeine; and Penicillins   Review of Systems Review of Systems See HPI for ROS   Physical Exam Updated Vital Signs BP 102/61   Pulse (!) 109   Temp 98.1 F (36.7 C) (Oral)   Resp 18   Ht 5\' 6"  (1.676 m)   Wt 91.6 kg (202 lb)   SpO2 99%   BMI 32.60 kg/m   Physical Exam  Constitutional: She is oriented to person, place, and time. She appears well-developed and well-nourished. No distress.  HENT:  Head: Normocephalic and atraumatic.  Eyes: EOM are normal. Pupils are equal, round, and reactive to light.  Neck: Normal range of motion. Neck supple.  Cardiovascular: Regular rhythm. Exam reveals no gallop and no friction rub.  No murmur heard. +tachycardia  Pulmonary/Chest: Effort normal and breath sounds normal. She has no wheezes. She has no rales.  Abdominal: Soft. Bowel sounds are normal. She exhibits no distension. There is no tenderness. There is no  rebound.  Musculoskeletal: She exhibits no deformity.  Neurological: She is alert and oriented to person, place, and time. No cranial nerve deficit.  Skin: Skin is warm and dry. No rash noted.  Psychiatric: She has a normal mood and affect.     ED Treatments / Results  Labs (all labs ordered are listed, but only abnormal results are displayed) Labs Reviewed  CBC - Abnormal; Notable for the following components:      Result Value   WBC 12.2 (*)    All other components within normal  limits  COMPREHENSIVE METABOLIC PANEL - Abnormal; Notable for the following components:   CO2 21 (*)    Glucose, Bld 172 (*)    Creatinine, Ser 1.36 (*)    GFR calc non Af Amer 44 (*)    GFR calc Af Amer 51 (*)    All other components within normal limits  CBG MONITORING, ED - Abnormal; Notable for the following components:   Glucose-Capillary 151 (*)    All other components within normal limits    EKG  EKG Interpretation  Date/Time:  Friday January 25 2017 14:23:05 EST Ventricular Rate:  123 PR Interval:  136 QRS Duration: 60 QT Interval:  312 QTC Calculation: 446 R Axis:   -58 Text Interpretation:  Sinus tachycardia with occasional Premature ventricular complexes Right atrial enlargement Left axis deviation Low voltage QRS Inferior infarct , age undetermined Cannot rule out Anterior infarct , age undetermined No significant change since last tracing Confirmed by Blanchie Dessert [phone]) on 01/25/2017 4:26:06 PM       Radiology No results found.  Procedures Procedures (including critical care time)  Medications Ordered in ED Medications  ondansetron (ZOFRAN) tablet 4 mg (4 mg Oral Given 01/25/17 1540)  sodium chloride 0.9 % bolus 1,000 mL (0 mLs Intravenous Stopped 01/25/17 1647)  acetaminophen (TYLENOL) tablet 650 mg (650 mg Oral Given 01/25/17 1540)  sodium chloride 0.9 % bolus 1,000 mL (1,000 mLs Intravenous New Bag/Given 01/25/17 1620)     Initial Impression / Assessment and Plan /  ED Course  I have reviewed the triage vital signs and the nursing notes.  Pertinent labs & imaging results that were available during my care of the patient were reviewed by me and considered in my medical decision making (see chart for details).    52 year old female presenting with light headedness after several episodes of NBNB emesis, most consistent with orthostasis secondary to dehydration. Patient is initially tachycardic and borderline hypotensive on presentation. Treated with zofran, IV fluid bolus x2. On reassessment, patient endorsed improvement in symptoms of dizziness and nausea. Overall clinical presentation most likely due to a viral illness.  Patient passed a PO challenge and was considered stable for discharge with a short course Zofran. Asked to follow up with PCP as needed. Return precautions were discussed.  Final Clinical Impressions(s) / ED Diagnoses   Final diagnoses:  Light headedness  Dehydration    ED Discharge Orders        Ordered    ondansetron (ZOFRAN) 4 MG tablet  Daily PRN     01/25/17 1713       Everrett Coombe, MD 01/25/17 1726    Blanchie Dessert, MD 01/25/17 1757

## 2017-04-12 ENCOUNTER — Other Ambulatory Visit: Payer: Self-pay | Admitting: Internal Medicine

## 2017-05-08 DIAGNOSIS — E785 Hyperlipidemia, unspecified: Secondary | ICD-10-CM | POA: Diagnosis not present

## 2017-05-08 DIAGNOSIS — E1122 Type 2 diabetes mellitus with diabetic chronic kidney disease: Secondary | ICD-10-CM | POA: Diagnosis not present

## 2017-05-08 DIAGNOSIS — I129 Hypertensive chronic kidney disease with stage 1 through stage 4 chronic kidney disease, or unspecified chronic kidney disease: Secondary | ICD-10-CM | POA: Diagnosis not present

## 2017-05-14 DIAGNOSIS — R197 Diarrhea, unspecified: Secondary | ICD-10-CM | POA: Diagnosis not present

## 2017-05-14 DIAGNOSIS — E1165 Type 2 diabetes mellitus with hyperglycemia: Secondary | ICD-10-CM | POA: Diagnosis not present

## 2017-05-14 DIAGNOSIS — Z0000 Encounter for general adult medical examination without abnormal findings: Secondary | ICD-10-CM | POA: Diagnosis not present

## 2017-05-14 DIAGNOSIS — M25512 Pain in left shoulder: Secondary | ICD-10-CM | POA: Diagnosis not present

## 2017-05-15 ENCOUNTER — Other Ambulatory Visit: Payer: Self-pay | Admitting: Internal Medicine

## 2017-05-15 DIAGNOSIS — M79605 Pain in left leg: Principal | ICD-10-CM

## 2017-05-15 DIAGNOSIS — M79604 Pain in right leg: Secondary | ICD-10-CM

## 2017-06-10 DIAGNOSIS — E1165 Type 2 diabetes mellitus with hyperglycemia: Secondary | ICD-10-CM | POA: Diagnosis not present

## 2017-06-10 DIAGNOSIS — I10 Essential (primary) hypertension: Secondary | ICD-10-CM | POA: Diagnosis not present

## 2017-06-10 DIAGNOSIS — E785 Hyperlipidemia, unspecified: Secondary | ICD-10-CM | POA: Diagnosis not present

## 2017-06-22 ENCOUNTER — Other Ambulatory Visit: Payer: Self-pay | Admitting: Family Medicine

## 2017-06-22 DIAGNOSIS — N951 Menopausal and female climacteric states: Secondary | ICD-10-CM

## 2017-07-10 ENCOUNTER — Other Ambulatory Visit: Payer: Self-pay

## 2017-07-10 ENCOUNTER — Emergency Department (HOSPITAL_BASED_OUTPATIENT_CLINIC_OR_DEPARTMENT_OTHER): Payer: BLUE CROSS/BLUE SHIELD

## 2017-07-10 ENCOUNTER — Encounter (HOSPITAL_BASED_OUTPATIENT_CLINIC_OR_DEPARTMENT_OTHER): Payer: Self-pay

## 2017-07-10 ENCOUNTER — Emergency Department (HOSPITAL_BASED_OUTPATIENT_CLINIC_OR_DEPARTMENT_OTHER)
Admission: EM | Admit: 2017-07-10 | Discharge: 2017-07-10 | Disposition: A | Discharge status: Home / Self Care | Payer: BLUE CROSS/BLUE SHIELD | Attending: Emergency Medicine | Admitting: Emergency Medicine

## 2017-07-10 DIAGNOSIS — R51 Headache: Secondary | ICD-10-CM | POA: Diagnosis not present

## 2017-07-10 DIAGNOSIS — E1122 Type 2 diabetes mellitus with diabetic chronic kidney disease: Secondary | ICD-10-CM | POA: Diagnosis not present

## 2017-07-10 DIAGNOSIS — Z7902 Long term (current) use of antithrombotics/antiplatelets: Secondary | ICD-10-CM | POA: Insufficient documentation

## 2017-07-10 DIAGNOSIS — G501 Atypical facial pain: Secondary | ICD-10-CM | POA: Diagnosis not present

## 2017-07-10 DIAGNOSIS — R42 Dizziness and giddiness: Secondary | ICD-10-CM

## 2017-07-10 DIAGNOSIS — I129 Hypertensive chronic kidney disease with stage 1 through stage 4 chronic kidney disease, or unspecified chronic kidney disease: Secondary | ICD-10-CM | POA: Diagnosis not present

## 2017-07-10 DIAGNOSIS — E860 Dehydration: Secondary | ICD-10-CM | POA: Diagnosis not present

## 2017-07-10 DIAGNOSIS — Z7984 Long term (current) use of oral hypoglycemic drugs: Secondary | ICD-10-CM | POA: Diagnosis not present

## 2017-07-10 DIAGNOSIS — N183 Chronic kidney disease, stage 3 (moderate): Secondary | ICD-10-CM | POA: Diagnosis not present

## 2017-07-10 DIAGNOSIS — R05 Cough: Secondary | ICD-10-CM | POA: Insufficient documentation

## 2017-07-10 DIAGNOSIS — J0100 Acute maxillary sinusitis, unspecified: Secondary | ICD-10-CM | POA: Diagnosis not present

## 2017-07-10 DIAGNOSIS — Z8673 Personal history of transient ischemic attack (TIA), and cerebral infarction without residual deficits: Secondary | ICD-10-CM | POA: Diagnosis not present

## 2017-07-10 DIAGNOSIS — Z79899 Other long term (current) drug therapy: Secondary | ICD-10-CM | POA: Insufficient documentation

## 2017-07-10 DIAGNOSIS — R1110 Vomiting, unspecified: Secondary | ICD-10-CM | POA: Diagnosis not present

## 2017-07-10 DIAGNOSIS — Z7901 Long term (current) use of anticoagulants: Secondary | ICD-10-CM | POA: Diagnosis not present

## 2017-07-10 LAB — CBC WITH DIFFERENTIAL/PLATELET
Basophils Absolute: 0 10*3/uL (ref 0.0–0.1)
Basophils Relative: 0 %
Eosinophils Absolute: 0.2 10*3/uL (ref 0.0–0.7)
Eosinophils Relative: 2 %
HEMATOCRIT: 45.2 % (ref 36.0–46.0)
HEMOGLOBIN: 15.1 g/dL — AB (ref 12.0–15.0)
LYMPHS ABS: 3.2 10*3/uL (ref 0.7–4.0)
LYMPHS PCT: 29 %
MCH: 30.9 pg (ref 26.0–34.0)
MCHC: 33.4 g/dL (ref 30.0–36.0)
MCV: 92.4 fL (ref 78.0–100.0)
MONOS PCT: 7 %
Monocytes Absolute: 0.7 10*3/uL (ref 0.1–1.0)
NEUTROS ABS: 6.7 10*3/uL (ref 1.7–7.7)
NEUTROS PCT: 62 %
Platelets: 304 10*3/uL (ref 150–400)
RBC: 4.89 MIL/uL (ref 3.87–5.11)
RDW: 14.6 % (ref 11.5–15.5)
WBC: 10.8 10*3/uL — AB (ref 4.0–10.5)

## 2017-07-10 LAB — URINALYSIS, ROUTINE W REFLEX MICROSCOPIC
BILIRUBIN URINE: NEGATIVE
HGB URINE DIPSTICK: NEGATIVE
Ketones, ur: NEGATIVE mg/dL
Leukocytes, UA: NEGATIVE
Nitrite: NEGATIVE
Protein, ur: NEGATIVE mg/dL
SPECIFIC GRAVITY, URINE: 1.01 (ref 1.005–1.030)
pH: 7 (ref 5.0–8.0)

## 2017-07-10 LAB — COMPREHENSIVE METABOLIC PANEL
ALK PHOS: 55 U/L (ref 38–126)
ALT: 22 U/L (ref 0–44)
ANION GAP: 10 (ref 5–15)
AST: 22 U/L (ref 15–41)
Albumin: 3.7 g/dL (ref 3.5–5.0)
BILIRUBIN TOTAL: 0.3 mg/dL (ref 0.3–1.2)
BUN: 24 mg/dL — ABNORMAL HIGH (ref 6–20)
CALCIUM: 9.1 mg/dL (ref 8.9–10.3)
CO2: 22 mmol/L (ref 22–32)
CREATININE: 1.54 mg/dL — AB (ref 0.44–1.00)
Chloride: 105 mmol/L (ref 98–111)
GFR calc non Af Amer: 37 mL/min — ABNORMAL LOW (ref >60)
GFR, EST AFRICAN AMERICAN: 43 mL/min — AB (ref >60)
GLUCOSE: 170 mg/dL — AB (ref 70–99)
Potassium: 4.2 mmol/L (ref 3.5–5.1)
Sodium: 137 mmol/L (ref 135–145)
TOTAL PROTEIN: 7 g/dL (ref 6.5–8.1)

## 2017-07-10 LAB — RAPID STREP SCREEN (MED CTR MEBANE ONLY): Streptococcus, Group A Screen (Direct): NEGATIVE

## 2017-07-10 LAB — URINALYSIS, MICROSCOPIC (REFLEX): WBC, UA: NONE SEEN WBC/hpf (ref 0–5)

## 2017-07-10 LAB — LIPASE, BLOOD: Lipase: 36 U/L (ref 11–51)

## 2017-07-10 LAB — TROPONIN I

## 2017-07-10 MED ORDER — SODIUM CHLORIDE 0.9 % IV BOLUS
1000.0000 mL | Freq: Once | INTRAVENOUS | Status: AC
  Administered 2017-07-10: 1000 mL via INTRAVENOUS

## 2017-07-10 MED ORDER — ONDANSETRON HCL 4 MG PO TABS: 4.0000 mg | ORAL_TABLET | Freq: Four times a day (QID) | ORAL | 0 refills | Status: DC | PRN

## 2017-07-10 MED ORDER — FLUTICASONE PROPIONATE 50 MCG/ACT NA SUSP: 2.0000 | Freq: Every day | NASAL | 0 refills | Status: DC

## 2017-07-10 MED ORDER — MECLIZINE HCL 25 MG PO TABS: 25.0000 mg | ORAL_TABLET | Freq: Three times a day (TID) | ORAL | 0 refills | Status: DC | PRN

## 2017-07-10 MED ORDER — ONDANSETRON HCL 4 MG/2ML IJ SOLN
INTRAMUSCULAR | Status: AC
  Filled 2017-07-10: qty 2

## 2017-07-10 MED ORDER — MECLIZINE HCL 25 MG PO TABS
25.0000 mg | ORAL_TABLET | Freq: Once | ORAL | Status: AC
Start: 2017-07-10 — End: 2017-07-10
  Administered 2017-07-10: 25 mg via ORAL
  Filled 2017-07-10: qty 1

## 2017-07-10 NOTE — ED Provider Notes (Signed)
[facility] EMERGENCY DEPARTMENT Provider Note   CSN: [id number] Arrival date & time: 07/10/17  1856     History   Chief Complaint Chief Complaint  Patient presents with  . Emesis    HPI [name] is a 52 y.o. female.  HPI Patient presents with dizziness described as feeling off balance.  She is had multiple episodes of vomiting.  Complains of right facial pain.  No shortness of breath or chest pain.  Patient does have mild nonproductive cough.  Denies abdominal pain, diarrhea or constipation.  No blood in the stool. Past Medical History:  Diagnosis Date  . Acid reflux   . Adrenal benign tumor    Apparently enlarging and patient "is supposed to have it taken out"  . Anxiety   . Depression   . Diabetes mellitus without complication ([place])   . DVT (deep vein thrombosis) in pregnancy ([place])   . Hypertension   . PE (pulmonary embolism)   . Stroke [facility])     Patient Active Problem List   Diagnosis Date Noted  . CKD (chronic kidney disease), stage III ([place]) 05/17/2016  . Type 2 diabetes mellitus with vascular disease ([place]) 05/17/2016  . Essential hypertension 05/17/2016  . DVT (deep venous thrombosis) ([place]) 05/16/2016  . Chronic ischemic left PCA stroke 07/30/2013  . TIA (transient ischemic attack) 07/30/2013  . Posterior circulation stroke of uncertain pathology ([place]) 07/30/2013    Past Surgical History:  Procedure Laterality Date  . LOOP RECORDER IMPLANT N/A 03/25/2014   Procedure: LOOP RECORDER IMPLANT;  Surgeon: [name], MD;  Location: [facility] CATH LAB;  Service: Cardiovascular;  Laterality: N/A;  . NO PAST SURGERIES    . TEE WITHOUT CARDIOVERSION N/A 03/25/2014   Procedure: TRANSESOPHAGEAL ECHOCARDIOGRAM (TEE);  Surgeon: [name], MD;  Location: [facility] ENDOSCOPY;  Service: Cardiovascular;  Laterality: N/A;     OB History    Gravida  2   Para  2   Term  2   Preterm  0   AB  0   Living  2     SAB  0   TAB  0   Ectopic  0    Multiple  0   Live Births               Home Medications    Prior to Admission medications   Medication Sig Start Date End Date Taking? Authorizing Provider  ALPRAZolam [name]) 1 MG tablet Take 1 mg by mouth 2 (two) times daily as needed for anxiety.   Yes [provider]  apixaban (ELIQUIS) 5 MG TABS tablet Take 2 tabs po BID x 13 doses then take 1 tab po BID after that 05/17/16  Yes Johnson, Clanford L, MD  FARXIGA 10 MG TABS tablet Take 10 mg by mouth daily. 03/14/16  Yes [provider]  fenofibrate 160 MG tablet Take 1 tablet (160 mg total) by mouth daily. 07/31/13  Yes [name], MD  lisinopril (PRINIVIL,ZESTRIL) 20 MG tablet Take 20 mg by mouth daily.   Yes [provider]  PARoxetine (PAXIL) 40 MG tablet Take 40 mg by mouth every morning.   Yes [provider]  TRULICITY 4.01 UU/7.2ZD SOPN INJECT 0.75 MG [place] ONCE A WEEK 06/10/17  Yes [provider]  venlafaxine XR (EFFEXOR XR) 75 MG 24 hr capsule Take 1 capsule (75 mg total) by mouth daily with breakfast. 07/15/15  Yes Harraway-Smith, Hoyle Sauer, MD  clopidogrel (PLAVIX) 75 MG tablet Take 1  tablet (75 mg total) by mouth daily. 07/31/13   [name], MD  cyclobenzaprine (FLEXERIL) 10 MG tablet Take 1 tablet (10 mg total) by mouth 2 (two) times daily as needed for muscle spasms. 11/26/15   [name], PA-C  dicyclomine (BENTYL) 20 MG tablet Take 1 tablet (20 mg total) by mouth 2 (two) times daily. 05/25/14   Noe Gens, PA-C  fluticasone (FLONASE) 50 MCG/ACT nasal spray Place 2 sprays into both nostrils daily. 07/10/17   Julianne Rice, MD  glimepiride (AMARYL) 2 MG tablet TK 1 T PO QD WITH BRE OR THE FIRST MAIN MEAL OF THE DAY 06/08/17   [provider]  lidocaine (LIDODERM) 5 % Place 1 patch onto the skin daily. Remove & Discard patch within 12 hours or as directed by MD 11/26/15   [name], PA-C  meclizine (ANTIVERT) 25 MG tablet Take 1 tablet (25 mg total) by mouth 3 (three)  times daily as needed for dizziness. 07/10/17   Julianne Rice, MD  metFORMIN (GLUCOPHAGE-XR) 500 MG 24 hr tablet Take 1 tablet (500 mg total) by mouth daily with breakfast. Patient taking differently: Take 500 mg by mouth 4 (four) times daily.  10/24/12   Fransico Meadow, PA-C  ondansetron (ZOFRAN) 4 MG tablet Take 1 tablet (4 mg total) by mouth every 6 (six) hours as needed for nausea or vomiting. 07/10/17   Julianne Rice, MD  pravastatin (PRAVACHOL) 10 MG tablet Take 1 tablet by mouth daily. 03/08/14   [provider]  pravastatin (PRAVACHOL) 20 MG tablet TK 1 T PO QHS 04/16/17   [provider]  promethazine (PHENERGAN) 25 MG tablet TK 1 T PO Q 6 H PRN 06/22/17   [provider]  SUMAtriptan (IMITREX) 100 MG tablet Take 100 mg by mouth daily as needed for migraine. 05/08/16   [provider]  venlafaxine XR (EFFEXOR-XR) 75 MG 24 hr capsule TAKE 1 CAPSULE(75 MG) BY MOUTH DAILY 05/28/16   [name], DO    Family History Family History  Problem Relation Age of Onset  . Heart attack Father 75    Social History Social History   Tobacco Use  . Smoking status: Current Every Day Smoker    Packs/day: 1.00    Years: 40.00    Pack years: 40.00    Types: Cigarettes  . Smokeless tobacco: Never Used  Substance Use Topics  . Alcohol use: No    Alcohol/week: 0.0 oz  . Drug use: No     Allergies   Amoxicillin; Aspirin; Codeine; and Penicillins   Review of Systems Review of Systems  Constitutional: Positive for fatigue. Negative for chills and fever.  HENT: Positive for sinus pressure and sore throat. Negative for congestion, trouble swallowing and voice change.   Eyes: Negative for visual disturbance.  Respiratory: Positive for cough. Negative for shortness of breath.   Cardiovascular: Negative for chest pain, palpitations and leg swelling.  Gastrointestinal: Positive for nausea and vomiting. Negative for abdominal pain, constipation and diarrhea.   Musculoskeletal: Negative for back pain, myalgias and neck pain.  Skin: Negative for rash and wound.  Neurological: Positive for dizziness, light-headedness and headaches. Negative for weakness and numbness.  All other systems reviewed and are negative.    Physical Exam Updated Vital Signs BP 92/71   Pulse (!) 108   Temp 98.7 F (37.1 C) (Oral)   Resp 16   Ht 5\' 6"  (1.676 m)   Wt 93.5 kg (206 lb 3.2 oz)  SpO2 95%   BMI 33.28 kg/m   Physical Exam  Constitutional: She is oriented to person, place, and time. She appears well-developed and well-nourished. No distress.  HENT:  Head: Normocephalic and atraumatic.  Mouth/Throat: Oropharynx is clear and moist.  Fatigable horizontal nystagmus.  Right maxillary sinus tenderness to percussion.  Oropharynx is mildly erythematous  Eyes: Pupils are equal, round, and reactive to light. EOM are normal.  Pupils 5 mm and reactive.  Neck: Normal range of motion. Neck supple. No JVD present.  No meningismus  Cardiovascular: Regular rhythm. Exam reveals no gallop and no friction rub.  No murmur heard. Tachycardia.  Pulmonary/Chest: Effort normal and breath sounds normal. No stridor. No respiratory distress. She has no wheezes. She has no rales. She exhibits no tenderness.  Abdominal: Soft. Bowel sounds are normal. There is no tenderness. There is no rebound and no guarding.  Musculoskeletal: Normal range of motion. She exhibits no edema or tenderness.  No midline thoracic or lumbar tenderness.  No CVA tenderness.  Lymphadenopathy:    She has no cervical adenopathy.  Neurological: She is alert and oriented to person, place, and time.  Patient is alert and oriented x3 with clear, goal oriented speech. Patient has 5/5 motor in all extremities. Sensation is intact to light touch. Bilateral finger-to-nose is normal with no signs of dysmetria.  Skin: Skin is warm and dry. Capillary refill takes less than 2 seconds. No rash noted. She is not  diaphoretic. No erythema.  Psychiatric: She has a normal mood and affect. Her behavior is normal.  Nursing note and vitals reviewed.    ED Treatments / Results  Labs (all labs ordered are listed, but only abnormal results are displayed) Labs Reviewed  CBC WITH DIFFERENTIAL/PLATELET - Abnormal; Notable for the following components:      Result Value   WBC 10.8 (*)    Hemoglobin 15.1 (*)    All other components within normal limits  COMPREHENSIVE METABOLIC PANEL - Abnormal; Notable for the following components:   Glucose, Bld 170 (*)    BUN 24 (*)    Creatinine, Ser 1.54 (*)    GFR calc non Af Amer 37 (*)    GFR calc Af Amer 43 (*)    All other components within normal limits  URINALYSIS, ROUTINE W REFLEX MICROSCOPIC - Abnormal; Notable for the following components:   Glucose, UA >=500 (*)    All other components within normal limits  URINALYSIS, MICROSCOPIC (REFLEX) - Abnormal; Notable for the following components:   Bacteria, UA RARE (*)    All other components within normal limits  RAPID STREP SCREEN ([facility] ONLY)  CULTURE, GROUP A STREP [facility])  LIPASE, BLOOD  TROPONIN I    EKG EKG Interpretation  Date/Time:  Wednesday July 10 2017 19:31:19 EDT Ventricular Rate:  109 PR Interval:    QRS Duration: 67 QT Interval:  309 QTC Calculation: 416 R Axis:   -58 Text Interpretation:  Sinus tachycardia Inferior infarct, old Consider anterior infarct Baseline wander in lead(s) II III aVR aVF Confirmed by Julianne Rice [id number]) on 07/10/2017 9:57:38 PM   Radiology Dg Chest 2 View  Result Date: 07/10/2017 CLINICAL DATA:  Cough EXAM: CHEST - 2 VIEW COMPARISON:  05/17/2016 FINDINGS: The heart size and mediastinal contours are within normal limits. Both lungs are clear. Recording device over the left chest. IMPRESSION: No active cardiopulmonary disease. Electronically Signed   By: Donavan Foil M.D.   On: 07/10/2017 20:25   Ct Head Wo  Contrast  Result Date: 07/10/2017 CLINICAL  DATA:  Headache and dizziness EXAM: CT HEAD WITHOUT CONTRAST TECHNIQUE: Contiguous axial images were obtained from the base of the skull through the vertex without intravenous contrast. COMPARISON:  MRI 07/31/2013, CT brain 07/30/2013 FINDINGS: Brain: No acute territorial infarction, hemorrhage or intracranial mass. Old left occipital and cerebellar infarcts. Moderate atrophy. Stable ventricle size. Vascular: No hyperdense vessels.  No unexpected calcification Skull: Normal. Negative for fracture or focal lesion. Sinuses/Orbits: Mucosal opacification of left ethmoid sinus. No acute orbital abnormality Other: None IMPRESSION: 1. No CT evidence for acute intracranial abnormality. 2. Atrophy and old left occipital and cerebellar infarcts. Electronically Signed   By: Donavan Foil M.D.   On: 07/10/2017 20:24    Procedures Procedures (including critical care time)  Medications Ordered in ED Medications  ondansetron (ZOFRAN) 4 MG/2ML injection (has no administration in time range)  sodium chloride 0.9 % bolus 1,000 mL (1,000 mLs Intravenous New Bag/Given 07/10/17 2127)  sodium chloride 0.9 % bolus 1,000 mL (0 mLs Intravenous Stopped 07/10/17 2109)  meclizine (ANTIVERT) tablet 25 mg (25 mg Oral Given 07/10/17 1954)     Initial Impression / Assessment and Plan / ED Course  I have reviewed the triage vital signs and the nursing notes.  Pertinent labs & imaging results that were available during my care of the patient were reviewed by me and considered in my medical decision making (see chart for details).    Patient states her dizziness has resolved.  Is ambulating without any symptoms.  No nausea or vomiting.  To leave prior to completion of second fluid bolus so that she can smoke cigarettes.  Offered nicotine patch.  Patient refused..  Blood pressure remains borderline low but tachycardia is improved.  CT head with previous strokes present but no acute findings other than sinus disease.  Understands the  need to follow-up with neurology.  Strict return precautions have been given.   Final Clinical Impressions(s) / ED Diagnoses   Final diagnoses:  Acute maxillary sinusitis, recurrence not specified  Vertigo  Dehydration    ED Discharge Orders        Ordered    meclizine (ANTIVERT) 25 MG tablet  3 times daily PRN     07/10/17 2154    ondansetron (ZOFRAN) 4 MG tablet  Every 6 hours PRN     07/10/17 2154    fluticasone (FLONASE) 50 MCG/ACT nasal spray  Daily     07/10/17 2154       Julianne Rice, MD 07/10/17 2159

## 2017-07-10 NOTE — ED Triage Notes (Signed)
C/o n/v x today-cough x 2-3 days-NAD-steady gait

## 2017-07-13 LAB — CULTURE, GROUP A STREP (THRC)

## 2017-07-25 ENCOUNTER — Other Ambulatory Visit: Payer: Self-pay | Admitting: Family Medicine

## 2017-07-25 DIAGNOSIS — N951 Menopausal and female climacteric states: Secondary | ICD-10-CM

## 2017-11-27 DIAGNOSIS — Z79899 Other long term (current) drug therapy: Secondary | ICD-10-CM | POA: Diagnosis not present

## 2018-01-06 DIAGNOSIS — Z5181 Encounter for therapeutic drug level monitoring: Secondary | ICD-10-CM | POA: Diagnosis not present

## 2018-07-01 ENCOUNTER — Emergency Department (HOSPITAL_BASED_OUTPATIENT_CLINIC_OR_DEPARTMENT_OTHER)
Admission: EM | Admit: 2018-07-01 | Discharge: 2018-07-01 | Disposition: A | Discharge status: Home / Self Care | Payer: BC Managed Care – PPO | Attending: Emergency Medicine | Admitting: Emergency Medicine

## 2018-07-01 ENCOUNTER — Emergency Department (HOSPITAL_BASED_OUTPATIENT_CLINIC_OR_DEPARTMENT_OTHER): Payer: BC Managed Care – PPO

## 2018-07-01 ENCOUNTER — Encounter (HOSPITAL_BASED_OUTPATIENT_CLINIC_OR_DEPARTMENT_OTHER): Payer: Self-pay

## 2018-07-01 ENCOUNTER — Other Ambulatory Visit: Payer: Self-pay

## 2018-07-01 DIAGNOSIS — Z20828 Contact with and (suspected) exposure to other viral communicable diseases: Secondary | ICD-10-CM | POA: Insufficient documentation

## 2018-07-01 DIAGNOSIS — I129 Hypertensive chronic kidney disease with stage 1 through stage 4 chronic kidney disease, or unspecified chronic kidney disease: Secondary | ICD-10-CM | POA: Insufficient documentation

## 2018-07-01 DIAGNOSIS — Z79899 Other long term (current) drug therapy: Secondary | ICD-10-CM | POA: Insufficient documentation

## 2018-07-01 DIAGNOSIS — E1165 Type 2 diabetes mellitus with hyperglycemia: Secondary | ICD-10-CM | POA: Diagnosis not present

## 2018-07-01 DIAGNOSIS — Z7984 Long term (current) use of oral hypoglycemic drugs: Secondary | ICD-10-CM | POA: Insufficient documentation

## 2018-07-01 DIAGNOSIS — R059 Cough, unspecified: Secondary | ICD-10-CM

## 2018-07-01 DIAGNOSIS — F17210 Nicotine dependence, cigarettes, uncomplicated: Secondary | ICD-10-CM | POA: Diagnosis not present

## 2018-07-01 DIAGNOSIS — R509 Fever, unspecified: Secondary | ICD-10-CM | POA: Diagnosis not present

## 2018-07-01 DIAGNOSIS — N183 Chronic kidney disease, stage 3 (moderate): Secondary | ICD-10-CM | POA: Diagnosis not present

## 2018-07-01 DIAGNOSIS — R739 Hyperglycemia, unspecified: Secondary | ICD-10-CM

## 2018-07-01 DIAGNOSIS — R05 Cough: Secondary | ICD-10-CM | POA: Diagnosis not present

## 2018-07-01 DIAGNOSIS — E1122 Type 2 diabetes mellitus with diabetic chronic kidney disease: Secondary | ICD-10-CM | POA: Insufficient documentation

## 2018-07-01 DIAGNOSIS — E1159 Type 2 diabetes mellitus with other circulatory complications: Secondary | ICD-10-CM | POA: Insufficient documentation

## 2018-07-01 LAB — BASIC METABOLIC PANEL
Anion gap: 11 (ref 5–15)
BUN: 10 mg/dL (ref 6–20)
CO2: 22 mmol/L (ref 22–32)
Calcium: 9.3 mg/dL (ref 8.9–10.3)
Chloride: 101 mmol/L (ref 98–111)
Creatinine, Ser: 1.02 mg/dL — ABNORMAL HIGH (ref 0.44–1.00)
GFR calc Af Amer: >60 mL/min (ref >60)
GFR calc non Af Amer: >60 mL/min (ref >60)
Glucose, Bld: 281 mg/dL — ABNORMAL HIGH (ref 70–99)
Potassium: 4.5 mmol/L (ref 3.5–5.1)
Sodium: 134 mmol/L — ABNORMAL LOW (ref 135–145)

## 2018-07-01 LAB — CBC WITH DIFFERENTIAL/PLATELET
Abs Immature Granulocytes: 0.06 10*3/uL (ref 0.00–0.07)
Basophils Absolute: 0.1 10*3/uL (ref 0.0–0.1)
Basophils Relative: 1 %
Eosinophils Absolute: 0.3 10*3/uL (ref 0.0–0.5)
Eosinophils Relative: 3 %
HCT: 47.9 % — ABNORMAL HIGH (ref 36.0–46.0)
Hemoglobin: 15.4 g/dL — ABNORMAL HIGH (ref 12.0–15.0)
Immature Granulocytes: 1 %
Lymphocytes Relative: 27 %
Lymphs Abs: 2.9 10*3/uL (ref 0.7–4.0)
MCH: 30.7 pg (ref 26.0–34.0)
MCHC: 32.2 g/dL (ref 30.0–36.0)
MCV: 95.6 fL (ref 80.0–100.0)
Monocytes Absolute: 0.7 10*3/uL (ref 0.1–1.0)
Monocytes Relative: 6 %
Neutro Abs: 6.7 10*3/uL (ref 1.7–7.7)
Neutrophils Relative %: 62 %
Platelets: 281 10*3/uL (ref 150–400)
RBC: 5.01 MIL/uL (ref 3.87–5.11)
RDW: 13.5 % (ref 11.5–15.5)
WBC: 10.7 10*3/uL — ABNORMAL HIGH (ref 4.0–10.5)
nRBC: 0 % (ref 0.0–0.2)

## 2018-07-01 LAB — URINALYSIS, ROUTINE W REFLEX MICROSCOPIC
Bilirubin Urine: NEGATIVE
Glucose, UA: >=500 mg/dL — AB
Hgb urine dipstick: NEGATIVE
Ketones, ur: NEGATIVE mg/dL
Leukocytes,Ua: NEGATIVE
Nitrite: NEGATIVE
Protein, ur: NEGATIVE mg/dL
Specific Gravity, Urine: 1.01 (ref 1.005–1.030)
pH: 6 (ref 5.0–8.0)

## 2018-07-01 LAB — URINALYSIS, MICROSCOPIC (REFLEX)

## 2018-07-01 MED ORDER — SODIUM CHLORIDE 0.9 % IV BOLUS
1000.0000 mL | Freq: Once | INTRAVENOUS | Status: AC
  Administered 2018-07-01: 1000 mL via INTRAVENOUS

## 2018-07-01 MED ORDER — ACETAMINOPHEN 500 MG PO TABS
1000.0000 mg | ORAL_TABLET | Freq: Once | ORAL | Status: AC
  Administered 2018-07-01: 1000 mg via ORAL
  Filled 2018-07-01: qty 2

## 2018-07-01 NOTE — ED Triage Notes (Addendum)
pt c/o fever, weakness, dizzy x 2 days-states husband dx with PNA-was covid neg-last tylenol 7am-NAD-steady gait

## 2018-07-01 NOTE — Discharge Instructions (Addendum)
Stay well-hydrated.  Take Tylenol every 4-6 hours for pain. Return for shortness of breath or new concerns.

## 2018-07-01 NOTE — ED Provider Notes (Signed)
[facility] EMERGENCY DEPARTMENT Provider Note   CSN: [id number] Arrival date & time: 07/01/18  1312    History   Chief Complaint Chief Complaint  Patient presents with  . Fever    HPI [name] is a 53 y.o. female.     Patient with history of diabetes, DVT and pregnancy/PE currently not on Xarelto presents with cough, fever, fatigue and lightheadedness.  Worsening for 2 days.  Husband had pneumonia recently.  Patient has not had recent COVID test.  Recent travel to [facility].  Patient denies any shortness of breath.     Past Medical History:  Diagnosis Date  . Acid reflux   . Adrenal benign tumor    Apparently enlarging and patient "is supposed to have it taken out"  . Anxiety   . Depression   . Diabetes mellitus without complication ([place])   . DVT (deep vein thrombosis) in pregnancy   . Hypertension   . PE (pulmonary embolism)   . Stroke [facility])     Patient Active Problem List   Diagnosis Date Noted  . CKD (chronic kidney disease), stage III ([place]) 05/17/2016  . Type 2 diabetes mellitus with vascular disease ([place]) 05/17/2016  . Essential hypertension 05/17/2016  . DVT (deep venous thrombosis) ([place]) 05/16/2016  . Chronic ischemic left PCA stroke 07/30/2013  . TIA (transient ischemic attack) 07/30/2013  . Posterior circulation stroke of uncertain pathology ([place]) 07/30/2013    Past Surgical History:  Procedure Laterality Date  . LOOP RECORDER IMPLANT N/A 03/25/2014   Procedure: LOOP RECORDER IMPLANT;  Surgeon: [name], MD;  Location: [facility] C[facility] LAB;  Service: Cardiovascular;  Laterality: N/A;  . NO PAST SURGERIES    . TEE WITHOUT CARDIOVERSION N/A 03/25/2014   Procedure: TRANSESOPHAGEAL ECHOCARDIOGRAM (TEE);  Surgeon: [name], MD;  Location: [facility] ENDOSCOPY;  Service: Cardiovascular;  Laterality: N/A;     OB History    Gravida  2   Para  2   Term  2   Preterm  0   AB  0   Living  2     SAB  0   TAB  0   Ectopic  0    Multiple  0   Live Births               Home Medications    Prior to Admission medications   Medication Sig Start Date End Date Taking? Authorizing Provider  ALPRAZolam [name]) 1 MG tablet Take 1 mg by mouth 2 (two) times daily as needed for anxiety.    [provider]  apixaban (ELIQUIS) 5 MG TABS tablet Take 2 tabs po BID x 13 doses then take 1 tab po BID after that 05/17/16   Irwin Brakeman L, MD  clopidogrel (PLAVIX) 75 MG tablet Take 1 tablet (75 mg total) by mouth daily. 07/31/13   [name], MD  cyclobenzaprine (FLEXERIL) 10 MG tablet Take 1 tablet (10 mg total) by mouth 2 (two) times daily as needed for muscle spasms. 11/26/15   [name], PA-C  dicyclomine (BENTYL) 20 MG tablet Take 1 tablet (20 mg total) by mouth 2 (two) times daily. 05/25/14   Leeroy Cha O, PA-C  FARXIGA 10 MG TABS tablet Take 10 mg by mouth daily. 03/14/16   [provider]  fenofibrate 160 MG tablet Take 1 tablet (160 mg total) by mouth daily. 07/31/13   [name], MD  fluticasone (FLONASE) 50 MCG/ACT nasal spray Place 2 sprays into both  nostrils daily. 07/10/17   Julianne Rice, MD  glimepiride (AMARYL) 2 MG tablet TK 1 T PO QD WITH BRE OR THE FIRST MAIN MEAL OF THE DAY 06/08/17   [provider]  lidocaine (LIDODERM) 5 % Place 1 patch onto the skin daily. Remove & Discard patch within 12 hours or as directed by MD 11/26/15   [name], PA-C  lisinopril (PRINIVIL,ZESTRIL) 20 MG tablet Take 20 mg by mouth daily.    [provider]  meclizine (ANTIVERT) 25 MG tablet Take 1 tablet (25 mg total) by mouth 3 (three) times daily as needed for dizziness. 07/10/17   Julianne Rice, MD  metFORMIN (GLUCOPHAGE-XR) 500 MG 24 hr tablet Take 1 tablet (500 mg total) by mouth daily with breakfast. Patient taking differently: Take 500 mg by mouth 4 (four) times daily.  10/24/12   Fransico Meadow, PA-C  ondansetron (ZOFRAN) 4 MG tablet Take 1 tablet (4 mg total) by mouth every 6  (six) hours as needed for nausea or vomiting. 07/10/17   Julianne Rice, MD  PARoxetine (PAXIL) 40 MG tablet Take 40 mg by mouth every morning.    [provider]  pravastatin (PRAVACHOL) 10 MG tablet Take 1 tablet by mouth daily. 03/08/14   [provider]  pravastatin (PRAVACHOL) 20 MG tablet TK 1 T PO QHS 04/16/17   [provider]  promethazine (PHENERGAN) 25 MG tablet TK 1 T PO Q 6 H PRN 06/22/17   [provider]  SUMAtriptan (IMITREX) 100 MG tablet Take 100 mg by mouth daily as needed for migraine. 05/08/16   [provider]  TRULICITY 2.84 XL/2.4MW SOPN INJECT 0.75 MG [place] ONCE A WEEK 06/10/17   [provider]  venlafaxine XR (EFFEXOR XR) 75 MG 24 hr capsule Take 1 capsule (75 mg total) by mouth daily with breakfast. 07/15/15   [name], MD  venlafaxine XR (EFFEXOR-XR) 75 MG 24 hr capsule TAKE 1 CAPSULE(75 MG) BY MOUTH DAILY 05/28/16   [name], DO    Family History Family History  Problem Relation Age of Onset  . Heart attack Father 20    Social History Social History   Tobacco Use  . Smoking status: Current Every Day Smoker    Packs/day: 1.00    Years: 40.00    Pack years: 40.00    Types: Cigarettes  . Smokeless tobacco: Never Used  Substance Use Topics  . Alcohol use: No    Alcohol/week: 0.0 standard drinks  . Drug use: No     Allergies   Amoxicillin, Aspirin, Codeine, and Penicillins   Review of Systems Review of Systems  Constitutional: Positive for appetite change, chills and fever.  HENT: Negative for congestion.   Eyes: Negative for visual disturbance.  Respiratory: Positive for cough. Negative for shortness of breath.   Cardiovascular: Negative for chest pain.  Gastrointestinal: Negative for abdominal pain and vomiting.  Genitourinary: Negative for dysuria and flank pain.  Musculoskeletal: Negative for back pain, neck pain and neck stiffness.  Skin: Negative for rash.  Neurological:  Positive for light-headedness. Negative for headaches.     Physical Exam Updated Vital Signs BP 120/81   Pulse (!) 102   Temp 97.9 F (36.6 C) (Oral)   Resp 18   Ht 5\' 6"  (1.676 m)   Wt 81.6 kg   SpO2 100%   BMI 29.05 kg/m   Physical Exam Vitals signs and nursing note reviewed.  Constitutional:      Appearance: She is well-developed.  HENT:     Head: Normocephalic and atraumatic.     Comments: Dry mm, No trismus, uvular deviation, unilateral posterior pharyngeal edema or submandibular swelling.  Eyes:     General:        Right eye: No discharge.        Left eye: No discharge.     Conjunctiva/sclera: Conjunctivae normal.  Neck:     Musculoskeletal: Normal range of motion and neck supple.     Trachea: No tracheal deviation.  Cardiovascular:     Rate and Rhythm: Regular rhythm. Tachycardia present.  Pulmonary:     Effort: Pulmonary effort is normal.     Breath sounds: Normal breath sounds.  Abdominal:     General: There is no distension.     Palpations: Abdomen is soft.     Tenderness: There is no abdominal tenderness. There is no guarding.  Skin:    General: Skin is warm.     Findings: No rash.  Neurological:     Mental Status: She is alert and oriented to person, place, and time.      ED Treatments / Results  Labs (all labs ordered are listed, but only abnormal results are displayed) Labs Reviewed  CBC WITH DIFFERENTIAL/PLATELET - Abnormal; Notable for the following components:      Result Value   WBC 10.7 (*)    Hemoglobin 15.4 (*)    HCT 47.9 (*)    All other components within normal limits  BASIC METABOLIC PANEL - Abnormal; Notable for the following components:   Sodium 134 (*)    Glucose, Bld 281 (*)    Creatinine, Ser 1.02 (*)    All other components within normal limits  URINALYSIS, ROUTINE W REFLEX MICROSCOPIC - Abnormal; Notable for the following components:   Glucose, UA >=500 (*)    All other components within normal limits  URINALYSIS,  MICROSCOPIC (REFLEX) - Abnormal; Notable for the following components:   Bacteria, UA RARE (*)    All other components within normal limits  NOVEL CORONAVIRUS, NAA (HOSPITAL ORDER, SEND-OUT TO REF LAB)    EKG None  Radiology No results found.  Procedures Procedures (including critical care time)  Medications Ordered in ED Medications  acetaminophen (TYLENOL) tablet 1,000 mg (1,000 mg Oral Given 07/01/18 1517)  sodium chloride 0.9 % bolus 1,000 mL ( Intravenous Stopped 07/01/18 1604)     Initial Impression / Assessment and Plan / ED Course  I have reviewed the triage vital signs and the nursing notes.  Pertinent labs & imaging results that were available during my care of the patient were reviewed by me and considered in my medical decision making (see chart for details).       Patient presents with infectious symptoms and fatigue.  Plan for screening blood work to check for anemia, kidney function and electrolytes.  Plan for chest x-ray to look for signs of bacterial pneumonia.  COVID test sent for outpatient follow-up.  Oral and IV fluids.  Pt care will be signed out to fup results for final disposition after fluids.  [name] was evaluated in Emergency Department on 07/05/2018 for the symptoms described in the history of present illness. She was evaluated in the context of the global COVID-19 pandemic, which necessitated consideration that the patient might be at risk for infection with the SARS-CoV-2 virus that causes COVID-19. Institutional protocols and algorithms that pertain to the evaluation of patients at risk for COVID-19 are in a state of rapid change based  on information released by regulatory bodies including the CDC and federal and state organizations. These policies and algorithms were followed during the patient's care in the ED.   Final Clinical Impressions(s) / ED Diagnoses   Final diagnoses:  Cough  Hyperglycemia    ED Discharge Orders    None        Elnora Morrison, MD 07/05/18 [phone]

## 2018-07-02 LAB — NOVEL CORONAVIRUS, NAA (HOSP ORDER, SEND-OUT TO REF LAB; TAT 18-24 HRS): SARS-CoV-2, NAA: NOT DETECTED

## 2018-07-18 ENCOUNTER — Other Ambulatory Visit: Payer: Self-pay

## 2018-07-18 ENCOUNTER — Emergency Department (HOSPITAL_BASED_OUTPATIENT_CLINIC_OR_DEPARTMENT_OTHER): Payer: BC Managed Care – PPO

## 2018-07-18 ENCOUNTER — Encounter (HOSPITAL_BASED_OUTPATIENT_CLINIC_OR_DEPARTMENT_OTHER): Payer: Self-pay | Admitting: *Deleted

## 2018-07-18 ENCOUNTER — Emergency Department (HOSPITAL_BASED_OUTPATIENT_CLINIC_OR_DEPARTMENT_OTHER)
Admission: EM | Admit: 2018-07-18 | Discharge: 2018-07-18 | Disposition: A | Discharge status: Home / Self Care | Payer: BC Managed Care – PPO | Attending: Emergency Medicine | Admitting: Emergency Medicine

## 2018-07-18 DIAGNOSIS — Z79899 Other long term (current) drug therapy: Secondary | ICD-10-CM | POA: Diagnosis not present

## 2018-07-18 DIAGNOSIS — F17210 Nicotine dependence, cigarettes, uncomplicated: Secondary | ICD-10-CM | POA: Insufficient documentation

## 2018-07-18 DIAGNOSIS — I10 Essential (primary) hypertension: Secondary | ICD-10-CM | POA: Diagnosis not present

## 2018-07-18 DIAGNOSIS — M25511 Pain in right shoulder: Secondary | ICD-10-CM | POA: Insufficient documentation

## 2018-07-18 DIAGNOSIS — R0602 Shortness of breath: Secondary | ICD-10-CM | POA: Diagnosis not present

## 2018-07-18 DIAGNOSIS — Z20828 Contact with and (suspected) exposure to other viral communicable diseases: Secondary | ICD-10-CM | POA: Insufficient documentation

## 2018-07-18 DIAGNOSIS — M25512 Pain in left shoulder: Secondary | ICD-10-CM | POA: Diagnosis not present

## 2018-07-18 DIAGNOSIS — R5383 Other fatigue: Secondary | ICD-10-CM | POA: Insufficient documentation

## 2018-07-18 DIAGNOSIS — R079 Chest pain, unspecified: Secondary | ICD-10-CM | POA: Diagnosis not present

## 2018-07-18 LAB — BASIC METABOLIC PANEL
Anion gap: 9 (ref 5–15)
BUN: 15 mg/dL (ref 6–20)
CO2: 23 mmol/L (ref 22–32)
Calcium: 8.9 mg/dL (ref 8.9–10.3)
Chloride: 103 mmol/L (ref 98–111)
Creatinine, Ser: 0.96 mg/dL (ref 0.44–1.00)
GFR calc Af Amer: >60 mL/min (ref >60)
GFR calc non Af Amer: >60 mL/min (ref >60)
Glucose, Bld: 331 mg/dL — ABNORMAL HIGH (ref 70–99)
Potassium: 4 mmol/L (ref 3.5–5.1)
Sodium: 135 mmol/L (ref 135–145)

## 2018-07-18 LAB — CBC
HCT: 47 % — ABNORMAL HIGH (ref 36.0–46.0)
Hemoglobin: 15.3 g/dL — ABNORMAL HIGH (ref 12.0–15.0)
MCH: 30.8 pg (ref 26.0–34.0)
MCHC: 32.6 g/dL (ref 30.0–36.0)
MCV: 94.6 fL (ref 80.0–100.0)
Platelets: 255 10*3/uL (ref 150–400)
RBC: 4.97 MIL/uL (ref 3.87–5.11)
RDW: 13.2 % (ref 11.5–15.5)
WBC: 8.2 10*3/uL (ref 4.0–10.5)
nRBC: 0 % (ref 0.0–0.2)

## 2018-07-18 LAB — TROPONIN I (HIGH SENSITIVITY): Troponin I (High Sensitivity): 2 ng/L (ref <18)

## 2018-07-18 MED ORDER — SODIUM CHLORIDE 0.9% FLUSH
3.0000 mL | Freq: Once | INTRAVENOUS | Status: DC
  Filled 2018-07-18: qty 3

## 2018-07-18 NOTE — ED Provider Notes (Signed)
[facility] EMERGENCY DEPARTMENT Provider Note   CSN: [id number] Arrival date & time: 07/18/18  1647     History   Chief Complaint Chief Complaint  Patient presents with  . Shoulder Pain  . Dizziness    HPI [name] is a 53 y.o. female.  She is a chronic smoker.  She does not have a primary care doctor.  She is complaining of bilateral shoulder pain that is been going on for at least a month.  She feels that they are swollen.  She is also had a worsening of her chronic smoker's cough productive of some yellow sputum.  She feels more short of breath and generally just weak all over.  She does not have a thermometer so does not know if she is had a fever but she said she is felt hot and cold.  No chest pain no vomiting or diarrhea.     The history is provided by the patient.  Shortness of Breath Severity:  Moderate Onset quality:  Gradual Timing:  Constant Progression:  Worsening Chronicity:  Chronic Context: activity   Relieved by:  Nothing Worsened by:  Activity Ineffective treatments:  None tried Associated symptoms: cough and sputum production   Associated symptoms: no abdominal pain, no chest pain, no diaphoresis, no fever, no headaches, no hemoptysis, no neck pain, no rash, no sore throat, no syncope and no vomiting   Risk factors: tobacco use     Past Medical History:  Diagnosis Date  . Acid reflux   . Adrenal benign tumor    Apparently enlarging and patient "is supposed to have it taken out"  . Anxiety   . Depression   . Diabetes mellitus without complication ([place])   . DVT (deep vein thrombosis) in pregnancy   . Hypertension   . PE (pulmonary embolism)   . Stroke [facility])     Patient Active Problem List   Diagnosis Date Noted  . CKD (chronic kidney disease), stage III ([place]) 05/17/2016  . 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 Essential hypertension 05/17/2016  . DVT (deep venous thrombosis) ([place]) 05/16/2016  .  Chronic ischemic left PCA stroke 07/30/2013  . TIA (transient ischemic attack) 07/30/2013  . Posterior circulation stroke of uncertain pathology ([place]) 07/30/2013    Past Surgical History:  Procedure Laterality Date  . LOOP RECORDER IMPLANT N/A 03/25/2014   Procedure: LOOP RECORDER IMPLANT;  Surgeon: [name], MD;  Location: [facility] CATH LAB;  Service: Cardiovascular;  Laterality: N/A;  . NO PAST SURGERIES    . TEE WITHOUT CARDIOVERSION N/A 03/25/2014   Procedure: TRANSESOPHAGEAL ECHOCARDIOGRAM (TEE);  Surgeon: [name], MD;  Location: [facility] ENDOSCOPY;  Service: Cardiovascular;  Laterality: N/A;     OB History    Gravida  2   Para  2   Term  2   Preterm  0   AB  0   Living  2     SAB  0   TAB  0   Ectopic  0   Multiple  0   Live Births               Home Medications    Prior to Admission medications   Medication Sig Start Date End Date Taking? Authorizing Provider  ALPRAZolam [name]) 1 MG tablet Take 1 mg by mouth 2 (two) times daily as needed for anxiety.    [provider]  apixaban (ELIQUIS) 5 MG TABS tablet Take 2  tabs po BID x 13 doses then take 1 tab po BID after that 05/17/16   Irwin Brakeman L, MD  clopidogrel (PLAVIX) 75 MG tablet Take 1 tablet (75 mg total) by mouth daily. 07/31/13   [name], MD  cyclobenzaprine (FLEXERIL) 10 MG tablet Take 1 tablet (10 mg total) by mouth 2 (two) times daily as needed for muscle spasms. 11/26/15   [name], PA-C  dicyclomine (BENTYL) 20 MG tablet Take 1 tablet (20 mg total) by mouth 2 (two) times daily. 05/25/14   Leeroy Cha O, PA-C  FARXIGA 10 MG TABS tablet Take 10 mg by mouth daily. 03/14/16   [provider]  fenofibrate 160 MG tablet Take 1 tablet (160 mg total) by mouth daily. 07/31/13   [name], MD  fluticasone (FLONASE) 50 MCG/ACT nasal spray Place 2 sprays into both nostrils daily. 07/10/17   Julianne Rice, MD  glimepiride (AMARYL) 2 MG tablet TK 1 T PO QD WITH BRE OR THE  FIRST MAIN MEAL OF THE DAY 06/08/17   [provider]  lidocaine (LIDODERM) 5 % Place 1 patch onto the skin daily. Remove & Discard patch within 12 hours or as directed by MD 11/26/15   [name], PA-C  lisinopril (PRINIVIL,ZESTRIL) 20 MG tablet Take 20 mg by mouth daily.    [provider]  meclizine (ANTIVERT) 25 MG tablet Take 1 tablet (25 mg total) by mouth 3 (three) times daily as needed for dizziness. 07/10/17   Julianne Rice, MD  metFORMIN (GLUCOPHAGE-XR) 500 MG 24 hr tablet Take 1 tablet (500 mg total) by mouth daily with breakfast. Patient taking differently: Take 500 mg by mouth 4 (four) times daily.  10/24/12   Fransico Meadow, PA-C  ondansetron (ZOFRAN) 4 MG tablet Take 1 tablet (4 mg total) by mouth every 6 (six) hours as needed for nausea or vomiting. 07/10/17   Julianne Rice, MD  PARoxetine (PAXIL) 40 MG tablet Take 40 mg by mouth every morning.    [provider]  pravastatin (PRAVACHOL) 10 MG tablet Take 1 tablet by mouth daily. 03/08/14   [provider]  pravastatin (PRAVACHOL) 20 MG tablet TK 1 T PO QHS 04/16/17   [provider]  promethazine (PHENERGAN) 25 MG tablet TK 1 T PO Q 6 H PRN 06/22/17   [provider]  SUMAtriptan (IMITREX) 100 MG tablet Take 100 mg by mouth daily as needed for migraine. 05/08/16   [provider]  TRULICITY 2.72 ZD/6.6YQ SOPN INJECT 0.75 MG [place] ONCE A WEEK 06/10/17   [provider]  venlafaxine XR (EFFEXOR XR) 75 MG 24 hr capsule Take 1 capsule (75 mg total) by mouth daily with breakfast. 07/15/15   [name], MD  venlafaxine XR (EFFEXOR-XR) 75 MG 24 hr capsule TAKE 1 CAPSULE(75 MG) BY MOUTH DAILY 05/28/16   [name], DO    Family History Family History  Problem Relation Age of Onset  . Heart attack Father 4    Social History Social History   Tobacco Use  . Smoking status: Current Every Day Smoker    Packs/day: 1.00    Years: 40.00    Pack years: 40.00     Types: Cigarettes  . Smokeless tobacco: Never Used  Substance Use Topics  . Alcohol use: No    Alcohol/week: 0.0 standard drinks  . Drug use: No     Allergies   Amoxicillin, Aspirin, Codeine, and Penicillins   Review of Systems Review of Systems  Constitutional:  Positive for fatigue. Negative for diaphoresis and fever.  HENT: Negative for sore throat.   Eyes: Negative for visual disturbance.  Respiratory: Positive for cough, sputum production and shortness of breath. Negative for hemoptysis.   Cardiovascular: Negative for chest pain and syncope.  Gastrointestinal: Negative for abdominal pain and vomiting.  Genitourinary: Negative for dysuria.  Musculoskeletal: Positive for arthralgias. Negative for neck pain.  Skin: Negative for rash.  Neurological: Negative for headaches.     Physical Exam Updated Vital Signs BP 101/71   Pulse 95   Temp 98.1 F (36.7 C) (Oral)   Resp 20   Ht 5\' 6"  (1.676 m)   Wt 81.6 kg   SpO2 100%   BMI 29.05 kg/m   Physical Exam Vitals signs and nursing note reviewed.  Constitutional:      General: She is not in acute distress.    Appearance: She is well-developed.  HENT:     Head: Normocephalic and atraumatic.  Eyes:     Conjunctiva/sclera: Conjunctivae normal.  Neck:     Musculoskeletal: Neck supple.  Cardiovascular:     Rate and Rhythm: Regular rhythm. Tachycardia present.     Heart sounds: No murmur.  Pulmonary:     Effort: Pulmonary effort is normal. No respiratory distress.     Breath sounds: Normal breath sounds.  Abdominal:     Palpations: Abdomen is soft.     Tenderness: There is no abdominal tenderness.  Musculoskeletal: Normal range of motion.        General: No deformity or signs of injury.     Right lower leg: No edema.     Left lower leg: No edema.     Comments: She has some vague tenderness around her shoulders although there is no overlying erythema.  Full range of motion.  Skin:    General: Skin is warm and  dry.     Capillary Refill: Capillary refill takes less than 2 seconds.  Neurological:     General: No focal deficit present.     Mental Status: She is alert and oriented to person, place, and time.      ED Treatments / Results  Labs (all labs ordered are listed, but only abnormal results are displayed) Labs Reviewed  BASIC METABOLIC PANEL - Abnormal; Notable for the following components:      Result Value   Glucose, Bld 331 (*)    All other components within normal limits  CBC - Abnormal; Notable for the following components:   Hemoglobin 15.3 (*)    HCT 47.0 (*)    All other components within normal limits  NOVEL CORONAVIRUS, NAA (HOSPITAL ORDER, SEND-OUT TO REF LAB)  TROPONIN I (HIGH SENSITIVITY)  TROPONIN I (HIGH SENSITIVITY)    EKG EKG Interpretation  Date/Time:  Friday July 18 2018 18:00:05 EDT Ventricular Rate:  107 PR Interval:    QRS Duration: 66 QT Interval:  326 QTC Calculation: 435 R Axis:   -25 Text Interpretation:  Sinus tachycardia Borderline left axis deviation Low voltage, precordial leads similar to prior 7/19 Confirmed by Aletta Edouard [phone]) on 07/18/2018 6:07:27 PM   Radiology Dg Chest 2 View  Result Date: 07/18/2018 CLINICAL DATA:  Chest pain EXAM: CHEST - 2 VIEW COMPARISON:  July 01, 2018 FINDINGS: The heart size is mildly enlarged. The lungs may be somewhat hyperexpanded. A loop recorder is noted. There is no pneumothorax. No large pleural effusion. IMPRESSION: No active cardiopulmonary disease. Electronically Signed   By: Jamie Kato.D.  On: 07/18/2018 18:18    Procedures Procedures (including critical care time)  Medications Ordered in ED Medications - No data to display   Initial Impression / Assessment and Plan / ED Course  I have reviewed the triage vital signs and the nursing notes.  Pertinent labs & imaging results that were available during my care of the patient were reviewed by me and considered in my medical decision  making (see chart for details).  Clinical Course as of Jul 18 848  Fri Jul 17, 3729  6437 53 year old female smoker here with increased shortness of breath fatigue and bilateral shoulder pain.  She does not have a primary care doctor.  She does not have any significant findings on exam and her initial screening lab work including CBC 7 troponin chest x-ray and EKG were unremarkable.  She is saying that she needs to leave the hospital now and does not want to stick around for any further testing.  We will try to give her information about trying to get a PCP.  She understands to return if any worsening symptoms.   [MB]    Clinical Course User Index [MB] Hayden Rasmussen, MD   [name] was evaluated in Emergency Department on 07/18/2018 for the symptoms described in the history of present illness. She was evaluated in the context of the global COVID-19 pandemic, which necessitated consideration that the patient might be at risk for infection with the SARS-CoV-2 virus that causes COVID-19. Institutional protocols and algorithms that pertain to the evaluation of patients at risk for COVID-19 are in a state of rapid change based on information released by regulatory bodies including the CDC and federal and state organizations. These policies and algorithms were followed during the patient's care in the ED.      Final Clinical Impressions(s) / ED Diagnoses   Final diagnoses:  Bilateral shoulder pain, unspecified chronicity  Shortness of breath  Fatigue, unspecified type    ED Discharge Orders    None       Hayden Rasmussen, MD 07/19/18 [phone]

## 2018-07-18 NOTE — ED Triage Notes (Signed)
States her shoulders have been swollen for a long time. She has had dizziness x 2 days. She also has a headache. Hx of migraines.

## 2018-07-18 NOTE — Discharge Instructions (Addendum)
You were seen in the emergency department for bilateral shoulder pain along with shortness of breath and fatigue.  You had blood work chest x-ray and an EKG that did not show any serious findings.  It will be important for you to limit smoking and establish care with a primary care doctor.  Please return the emergency department if any worsening symptoms.

## 2018-07-18 NOTE — ED Notes (Signed)
Patient transported to X-ray 

## 2018-07-18 NOTE — ED Notes (Signed)
Pt c/o dizziness for 2 days. Denies chest pain or sob. Alert. Ambulatory with steady gait.

## 2018-07-18 NOTE — ED Notes (Signed)
ED Provider at bedside. 

## 2018-07-24 LAB — NOVEL CORONAVIRUS, NAA (HOSP ORDER, SEND-OUT TO REF LAB; TAT 18-24 HRS): SARS-CoV-2, NAA: NOT DETECTED

## 2018-07-25 ENCOUNTER — Telehealth: Payer: Self-pay | Admitting: General Practice

## 2018-07-25 NOTE — Telephone Encounter (Signed)
Patient advised of negative results. Expressed understanding

## 2018-09-06 ENCOUNTER — Other Ambulatory Visit: Payer: Self-pay

## 2018-09-06 ENCOUNTER — Emergency Department (HOSPITAL_COMMUNITY): Payer: BC Managed Care – PPO

## 2018-09-06 ENCOUNTER — Emergency Department (HOSPITAL_COMMUNITY)
Admission: EM | Admit: 2018-09-06 | Discharge: 2018-09-07 | Disposition: A | Discharge status: Home / Self Care | Payer: BC Managed Care – PPO | Attending: Emergency Medicine | Admitting: Emergency Medicine

## 2018-09-06 ENCOUNTER — Encounter (HOSPITAL_COMMUNITY): Payer: Self-pay | Admitting: Emergency Medicine

## 2018-09-06 DIAGNOSIS — R55 Syncope and collapse: Secondary | ICD-10-CM | POA: Diagnosis not present

## 2018-09-06 DIAGNOSIS — Z8673 Personal history of transient ischemic attack (TIA), and cerebral infarction without residual deficits: Secondary | ICD-10-CM | POA: Diagnosis not present

## 2018-09-06 DIAGNOSIS — N183 Chronic kidney disease, stage 3 (moderate): Secondary | ICD-10-CM | POA: Diagnosis not present

## 2018-09-06 DIAGNOSIS — R531 Weakness: Secondary | ICD-10-CM | POA: Diagnosis not present

## 2018-09-06 DIAGNOSIS — E1122 Type 2 diabetes mellitus with diabetic chronic kidney disease: Secondary | ICD-10-CM | POA: Diagnosis not present

## 2018-09-06 DIAGNOSIS — R42 Dizziness and giddiness: Secondary | ICD-10-CM

## 2018-09-06 DIAGNOSIS — R000 Tachycardia, unspecified: Secondary | ICD-10-CM | POA: Diagnosis not present

## 2018-09-06 DIAGNOSIS — I129 Hypertensive chronic kidney disease with stage 1 through stage 4 chronic kidney disease, or unspecified chronic kidney disease: Secondary | ICD-10-CM | POA: Diagnosis not present

## 2018-09-06 DIAGNOSIS — F17210 Nicotine dependence, cigarettes, uncomplicated: Secondary | ICD-10-CM | POA: Insufficient documentation

## 2018-09-06 DIAGNOSIS — I491 Atrial premature depolarization: Secondary | ICD-10-CM | POA: Diagnosis not present

## 2018-09-06 DIAGNOSIS — R51 Headache: Secondary | ICD-10-CM | POA: Insufficient documentation

## 2018-09-06 DIAGNOSIS — R0602 Shortness of breath: Secondary | ICD-10-CM | POA: Diagnosis not present

## 2018-09-06 DIAGNOSIS — E1165 Type 2 diabetes mellitus with hyperglycemia: Secondary | ICD-10-CM | POA: Diagnosis not present

## 2018-09-06 LAB — BASIC METABOLIC PANEL
Anion gap: 11 (ref 5–15)
BUN: 16 mg/dL (ref 6–20)
CO2: 23 mmol/L (ref 22–32)
Calcium: 9.3 mg/dL (ref 8.9–10.3)
Chloride: 99 mmol/L (ref 98–111)
Creatinine, Ser: 1.09 mg/dL — ABNORMAL HIGH (ref 0.44–1.00)
GFR calc Af Amer: >60 mL/min (ref >60)
GFR calc non Af Amer: 58 mL/min — ABNORMAL LOW (ref >60)
Glucose, Bld: 433 mg/dL — ABNORMAL HIGH (ref 70–99)
Potassium: 4 mmol/L (ref 3.5–5.1)
Sodium: 133 mmol/L — ABNORMAL LOW (ref 135–145)

## 2018-09-06 LAB — CBC
HCT: 46.7 % — ABNORMAL HIGH (ref 36.0–46.0)
Hemoglobin: 15.5 g/dL — ABNORMAL HIGH (ref 12.0–15.0)
MCH: 31.3 pg (ref 26.0–34.0)
MCHC: 33.2 g/dL (ref 30.0–36.0)
MCV: 94.2 fL (ref 80.0–100.0)
Platelets: 247 10*3/uL (ref 150–400)
RBC: 4.96 MIL/uL (ref 3.87–5.11)
RDW: 13.2 % (ref 11.5–15.5)
WBC: 15.5 10*3/uL — ABNORMAL HIGH (ref 4.0–10.5)
nRBC: 0 % (ref 0.0–0.2)

## 2018-09-06 LAB — CBG MONITORING, ED: Glucose-Capillary: 420 mg/dL — ABNORMAL HIGH (ref 70–99)

## 2018-09-06 MED ORDER — SODIUM CHLORIDE 0.9 % IV BOLUS
1000.0000 mL | Freq: Once | INTRAVENOUS | Status: AC
  Administered 2018-09-07: 1000 mL via INTRAVENOUS

## 2018-09-06 NOTE — ED Triage Notes (Addendum)
Pt comes to ed via ems comes from home, started feeling weakness out while cooking out outside. Pt started feeling dizziness, hx of diabetes.  V/s bp 112/69, hr 116, regular, cbg 432, spo2 98, rr18.  Iv 20  in left ac, 350 ns  Walks, and alert.  Pt stop taking medications and does not check blood sugar

## 2018-09-06 NOTE — ED Provider Notes (Signed)
[facility] DEPT Provider Note   CSN: [place]:[id number] Arrival date & time: 09/06/18  2134     History   Chief Complaint No chief complaint on file.   HPI [name] is a 53 y.o. female.     53 y.o female with a PMH of CKD, DM, PE, TIA presents to the ED with a chief complaint of dizziness and pre syncope x this evening. Patient reports she was having a cookout outside when she began to feel dizzy, short of breath, felt like she was going to syncopized.  Patient reports she has not been feeling well for about 1 week, reports she has been out of her diabetes medication, she has been taking 1 pill every other day.  She also endorses shortness of breath, this happens while at rest and with ambulation.  She does have a previous history of PEs, was on Eliquis but reports she has not been taking this medication in several months.  Patient overall has poor compliance with her medication.  She does not endorse any chest pain on today's visit.  She also endorses left head pain, reports this feels more like a sharp sensation on the frontal region of her head, states this began after the incident.  She also has a previous history of TIAs and strokes.  She did not syncopized, did not hit her head or lose consciousness but states she has been feeling very weak.  Has not been followed up by primary care in several months. Of note, patient is a chronic smoker.  No fevers, coughs, sick contacts.  The history is provided by the patient and medical records.    Past Medical History:  Diagnosis Date  . Acid reflux   . Adrenal benign tumor    Apparently enlarging and patient "is supposed to have it taken out"  . Anxiety   . Depression   . Diabetes mellitus without complication ([place])   . DVT (deep vein thrombosis) in pregnancy   . Hypertension   . PE (pulmonary embolism)   . Stroke [facility])     Patient Active Problem List   Diagnosis Date Noted  . CKD (chronic kidney  disease), stage III ([place]) 05/17/2016  . Type 2 diabetes mellitus with vascular disease ([place]) 05/17/2016  . Essential hypertension 05/17/2016  . DVT (deep venous thrombosis) ([place]) 05/16/2016  . Chronic ischemic left PCA stroke 07/30/2013  . TIA (transient ischemic attack) 07/30/2013  . Posterior circulation stroke of uncertain pathology ([place]) 07/30/2013    Past Surgical History:  Procedure Laterality Date  . LOOP RECORDER IMPLANT N/A 03/25/2014   Procedure: LOOP RECORDER IMPLANT;  Surgeon: [name], MD;  Location: [facility] CATH LAB;  Service: Cardiovascular;  Laterality: N/A;  . NO PAST SURGERIES    . TEE WITHOUT CARDIOVERSION N/A 03/25/2014   Procedure: TRANSESOPHAGEAL ECHOCARDIOGRAM (TEE);  Surgeon: [name], MD;  Location: [facility] ENDOSCOPY;  Service: Cardiovascular;  Laterality: N/A;     OB History    Gravida  2   Para  2   Term  2   Preterm  0   AB  0   Living  2     SAB  0   TAB  0   Ectopic  0   Multiple  0   Live Births               Home Medications    Prior to Admission medications   Medication Sig Start Date End Date Taking?  Authorizing Provider  acetaminophen (TYLENOL) 500 MG tablet Take 1,000 mg by mouth every 6 (six) hours as needed for moderate pain.   Yes [provider]  cyclobenzaprine (FLEXERIL) 10 MG tablet Take 1 tablet (10 mg total) by mouth 2 (two) times daily as needed for muscle spasms. 11/26/15  Yes [name], PA-C  diphenhydramine-acetaminophen (TYLENOL PM) 25-500 MG TABS tablet Take 2 tablets by mouth at bedtime as needed (sleep).   Yes [provider]  FARXIGA 10 MG TABS tablet Take 10 mg by mouth daily. 03/14/16  Yes [provider]  fenofibrate 160 MG tablet Take 1 tablet (160 mg total) by mouth daily. 07/31/13  Yes [name], MD  lisinopril (PRINIVIL,ZESTRIL) 20 MG tablet Take 20 mg by mouth daily.   Yes [provider]  PARoxetine (PAXIL) 40 MG tablet Take 40 mg by mouth every morning.    Yes [provider]  pravastatin (PRAVACHOL) 10 MG tablet Take 10 mg by mouth daily.  03/08/14  Yes [provider]  venlafaxine XR (EFFEXOR-XR) 75 MG 24 hr capsule TAKE 1 CAPSULE(75 MG) BY MOUTH DAILY Patient taking differently: Take 75 mg by mouth daily with breakfast.  05/28/16  Yes [name], DO  apixaban (ELIQUIS) 5 MG TABS tablet Take 2 tabs po BID x 13 doses then take 1 tab po BID after that Patient not taking: Reported on 09/06/2018 05/17/16   Murlean Iba, MD  clopidogrel (PLAVIX) 75 MG tablet Take 1 tablet (75 mg total) by mouth daily. Patient not taking: Reported on 09/06/2018 07/31/13   [name], MD  dicyclomine (BENTYL) 20 MG tablet Take 1 tablet (20 mg total) by mouth 2 (two) times daily. Patient not taking: Reported on 09/06/2018 05/25/14   Noe Gens, PA-C  fluticasone [facility]) 50 MCG/ACT nasal spray Place 2 sprays into both nostrils daily. Patient not taking: Reported on 09/06/2018 07/10/17   Julianne Rice, MD  lidocaine (LIDODERM) 5 % Place 1 patch onto the skin daily. Remove & Discard patch within 12 hours or as directed by MD Patient not taking: Reported on 09/06/2018 11/26/15   [name], PA-C  meclizine (ANTIVERT) 25 MG tablet Take 1 tablet (25 mg total) by mouth 3 (three) times daily as needed for dizziness. Patient not taking: Reported on 09/06/2018 07/10/17   Julianne Rice, MD  metFORMIN (GLUCOPHAGE-XR) 500 MG 24 hr tablet Take 1 tablet (500 mg total) by mouth daily with breakfast. Patient not taking: Reported on 09/06/2018 10/24/12   Fransico Meadow, PA-C  ondansetron (ZOFRAN) 4 MG tablet Take 1 tablet (4 mg total) by mouth every 6 (six) hours as needed for nausea or vomiting. Patient not taking: Reported on 09/06/2018 07/10/17   Julianne Rice, MD  venlafaxine XR (EFFEXOR XR) 75 MG 24 hr capsule Take 1 capsule (75 mg total) by mouth daily with breakfast. Patient not taking: Reported on 09/06/2018 07/15/15   [name], MD     Family History Family History  Problem Relation Age of Onset  . Heart attack Father 17    Social History Social History   Tobacco Use  . Smoking status: Current Every Day Smoker    Packs/day: 1.00    Years: 40.00    Pack years: 40.00    Types: Cigarettes  . Smokeless tobacco: Never Used  Substance Use Topics  . Alcohol use: No    Alcohol/week: 0.0 standard drinks  . Drug use: No     Allergies   Amoxicillin, Aspirin, Codeine, and  Penicillins   Review of Systems Review of Systems  Constitutional: Negative for fever.  HENT: Negative for rhinorrhea.   Eyes: Negative for redness.  Respiratory: Positive for cough (Chronic) and shortness of breath. Negative for wheezing.   Cardiovascular: Negative for chest pain and leg swelling.  Gastrointestinal: Negative for abdominal pain, nausea and vomiting.  Genitourinary: Negative for flank pain.  Musculoskeletal: Positive for myalgias. Negative for back pain.  Skin: Negative for pallor and wound.  Neurological: Positive for dizziness, weakness and headaches. Negative for syncope.     Physical Exam Updated Vital Signs BP 111/86   Pulse (!) 121   Temp 98.4 F (36.9 C) (Oral)   Resp 16   LMP  (LMP Unknown) Comment: "a few months"  SpO2 95%   Physical Exam Vitals signs and nursing note reviewed.  Constitutional:      General: She is not in acute distress.    Appearance: She is well-developed. She is ill-appearing.  HENT:     Head: Normocephalic and atraumatic.     Mouth/Throat:     Pharynx: No oropharyngeal exudate.  Eyes:     Conjunctiva/sclera:     Right eye: Right conjunctiva is not injected.     Left eye: Left conjunctiva is not injected.     Pupils: Pupils are equal, round, and reactive to light.     Comments: Pupils are equal and reactive.  Neck:     Musculoskeletal: Normal range of motion.  Cardiovascular:     Rate and Rhythm: Regular rhythm. Tachycardia present.     Heart sounds: Normal heart sounds.  No murmur.     Comments: No swelling noted to BL extremities.  Pulmonary:     Effort: Pulmonary effort is normal. No respiratory distress.     Comments: Lungs are diminished to auscultation.  Abdominal:     General: Bowel sounds are normal. There is no distension.     Palpations: Abdomen is soft.     Tenderness: There is no abdominal tenderness. There is no right CVA tenderness or left CVA tenderness.     Comments: Abdomen appears soft and nontender to palpation.  Musculoskeletal:        General: No tenderness or deformity.     Right lower leg: No edema.     Left lower leg: No edema.  Skin:    General: Skin is warm and dry.  Neurological:     Mental Status: She is alert and oriented to person, place, and time.     Comments: Alert, oriented, thought content appropriate. Speech fluent without evidence of aphasia. Able to follow 2 step commands without difficulty.  Cranial Nerves:  II:  Peripheral visual fields grossly normal, pupils, round, reactive to light III,IV, VI: ptosis not present, extra-ocular motions intact bilaterally  V,VII: smile symmetric, facial light touch sensation equal VIII: hearing grossly normal bilaterally  IX,X: midline uvula rise  XI: bilateral shoulder shrug equal and strong XII: midline tongue extension  Motor:  5/5 in upper and lower extremities bilaterally including strong and equal grip strength and dorsiflexion/plantar flexion Sensory: light touch normal in all extremities.  Cerebellar: normal finger-to-nose with bilateral upper extremities, pronator drift negative        ED Treatments / Results  Labs (all labs ordered are listed, but only abnormal results are displayed) Labs Reviewed  BASIC METABOLIC PANEL - Abnormal; Notable for the following components:      Result Value   Sodium 133 (*)    Glucose, Bld 433 (*)  Creatinine, Ser 1.09 (*)    GFR calc non Af Amer 58 (*)    All other components within normal limits  CBC - Abnormal; Notable  for the following components:   WBC 15.5 (*)    Hemoglobin 15.5 (*)    HCT 46.7 (*)    All other components within normal limits  CBG MONITORING, ED - Abnormal; Notable for the following components:   Glucose-Capillary 420 (*)    All other components within normal limits  URINALYSIS, ROUTINE W REFLEX MICROSCOPIC    EKG EKG Interpretation  Date/Time:  Saturday September 06 2018 22:47:09 EDT Ventricular Rate:  114 PR Interval:    QRS Duration: 76 QT Interval:  320 QTC Calculation: 441 R Axis:   -38 Text Interpretation:  Sinus tachycardia Atrial premature complexes Probable left atrial enlargement Left axis deviation Low voltage, extremity and precordial leads since last tracing no significant change Confirmed by Daleen Bo [phone]) on 09/06/2018 11:51:59 PM   Radiology Dg Chest 2 View  Result Date: 09/07/2018 CLINICAL DATA:  53 year old female with shortness of breath. EXAM: CHEST - 2 VIEW COMPARISON:  Chest radiograph dated 07/18/2018 FINDINGS: The lungs are clear. There is no pleural effusion or pneumothorax. The cardiac silhouette is within normal limits. Loop recorder device. No acute osseous pathology. IMPRESSION: No active cardiopulmonary disease. Electronically Signed   By: Anner Crete M.D.   On: 09/07/2018 00:33   Ct Angio Chest Pe W And/or Wo Contrast  Result Date: 09/07/2018 CLINICAL DATA:  53 year old female with shortness of breath and chest pain. Concern for pulmonary embolism. EXAM: CT ANGIOGRAPHY CHEST WITH CONTRAST TECHNIQUE: Multidetector CT imaging of the chest was performed using the standard protocol during bolus administration of intravenous contrast. Multiplanar CT image reconstructions and MIPs were obtained to evaluate the vascular anatomy. CONTRAST:  158mL OMNIPAQUE IOHEXOL 350 MG/ML SOLN COMPARISON:  Chest radiograph dated 09/07/2018 and CT dated 08/13/2013 FINDINGS: Cardiovascular: There is no cardiomegaly or pericardial effusion. The thoracic aorta is  unremarkable. No CT evidence of pulmonary embolism. Mediastinum/Nodes: There is no hilar or mediastinal adenopathy. The esophagus and the thyroid gland are grossly unremarkable. Lungs/Pleura: Centrilobular emphysema. There is no focal consolidation, pleural effusion, or pneumothorax. The central airways are patent. Upper Abdomen: There is an indeterminate 4.5 cm left adrenal lesion. This is a centrally similar to the CT of 2015 consistent with a benign etiology. Musculoskeletal: No chest wall abnormality. No acute or significant osseous findings. Left anterior chest wall loop recorder device. Review of the MIP images confirms the above findings. IMPRESSION: 1. No acute intrathoracic pathology. No CT evidence of pulmonary embolism. 2. Stable left adrenal mass, likely a benign or indolent process. Electronically Signed   By: Anner Crete M.D.   On: 09/07/2018 00:42    Procedures Procedures (including critical care time)  Medications Ordered in ED Medications  sodium chloride (PF) 0.9 % injection (has no administration in time range)  sodium chloride 0.9 % bolus 500 mL (has no administration in time range)  sodium chloride 0.9 % bolus 1,000 mL (0 mLs Intravenous Stopped 09/07/18 0058)  iohexol (OMNIPAQUE) 350 MG/ML injection 100 mL (100 mLs Intravenous Contrast Given 09/07/18 0011)     Initial Impression / Assessment and Plan / ED Course  I have reviewed the triage vital signs and the nursing notes.  Pertinent labs & imaging results that were available during my care of the patient were reviewed by me and considered in my medical decision making (see chart for details).  Patient with an extensive past medical history including pulmonary embolisms, TIAs, diabetes type 2 and CKD stage III presents to the ED status post presyncope.  Patient reports she was outside grilling out when she began to feel dizzy, short of breath, reports she almost fell off however did not hit the ground.  She  reports has been feeling fatigue , reports she has been running low her metformin medication, states she has been taking this 1 every other day as she currently does not have any PCP follow-up and has not had a refill on her medications.  She was also prescribed Eliquis after her pulmonary embolism along with strokes, reports she has not taken this medication in several months.  Also endorses shortness of breath, this is unchanged from rest and ambulation.  BMP showed slight hyponatremia, rest of electrolytes are within normal limits aside from her glucose level which is 433, no anion gap, no DKA on today's visit.  CBC shows slight leukocytosis of 15.5, hemoglobin is elevated, she is likely hemoconcentrated due to her smoking status.  UA is currently pending.  Orthostatic vital signs were obtained from patient, she is orthostatic we will provide her with 1-1/2 L of saline in order to rehydrate her.  Creatinine level slightly elevated on today's visit at 1.09, does not reflect CKD stage III as she currently has listed on her chart.  Due to patient's tachycardia, shortness of breath along with previous history of pulmonary embolisms cannot rule out without CT imaging.  Will obtain a CT angios chest to further evaluate patient's condition. CT Angio: 1. No acute intrathoracic pathology. No CT evidence of pulmonary  embolism.  2. Stable left adrenal mass, likely a benign or indolent process.    Xray of her chest showed: No consolidation, pneumothorax, pleural effusion.  Patient does have poor compliance with medication, encouraged to make an appointment to follow-up with primary care physician.  Patient understands that she will need to follow-up as she currently has chronic conditions that need medical attention.  I have discussed the results of her CT with patient, she is aware she will be receiving fluids along with reassessment by my attending Dr. Leonette Monarch prior to proper disposition.  Patient is agreeable  with plan.  Patient signed out to oncoming team for proper disposition.  Portions of this note were generated with [employer]. Dictation errors may occur despite best attempts at proofreading.  Final Clinical Impressions(s) / ED Diagnoses   Final diagnoses:  Dizziness  Pre-syncope  Shortness of breath    ED Discharge Orders    None       Janeece Fitting, PA-C 09/07/18 0103    Fatima Blank, MD 09/08/18 [phone]

## 2018-09-07 ENCOUNTER — Emergency Department (HOSPITAL_COMMUNITY): Payer: BC Managed Care – PPO

## 2018-09-07 ENCOUNTER — Encounter (HOSPITAL_COMMUNITY): Payer: Self-pay

## 2018-09-07 DIAGNOSIS — R0602 Shortness of breath: Secondary | ICD-10-CM | POA: Diagnosis not present

## 2018-09-07 LAB — CBG MONITORING, ED: Glucose-Capillary: 361 mg/dL — ABNORMAL HIGH (ref 70–99)

## 2018-09-07 MED ORDER — IOHEXOL 350 MG/ML SOLN
100.0000 mL | Freq: Once | INTRAVENOUS | Status: AC | PRN
  Administered 2018-09-07: 100 mL via INTRAVENOUS

## 2018-09-07 MED ORDER — LISINOPRIL 20 MG PO TABS: 20.0000 mg | ORAL_TABLET | Freq: Every day | ORAL | 0 refills | Status: DC

## 2018-09-07 MED ORDER — ONDANSETRON HCL 4 MG PO TABS: 4.0000 mg | ORAL_TABLET | Freq: Four times a day (QID) | ORAL | 0 refills | Status: DC | PRN

## 2018-09-07 MED ORDER — APIXABAN 5 MG PO TABS: ORAL_TABLET | ORAL | 0 refills | Status: DC

## 2018-09-07 MED ORDER — SODIUM CHLORIDE (PF) 0.9 % IJ SOLN
INTRAMUSCULAR | Status: AC
  Filled 2018-09-07: qty 50

## 2018-09-07 MED ORDER — CLOPIDOGREL BISULFATE 75 MG PO TABS: 75.0000 mg | ORAL_TABLET | Freq: Every day | ORAL | 2 refills | Status: DC

## 2018-09-07 MED ORDER — SODIUM CHLORIDE 0.9 % IV BOLUS
500.0000 mL | Freq: Once | INTRAVENOUS | Status: AC
  Administered 2018-09-07: 500 mL via INTRAVENOUS

## 2018-09-07 MED ORDER — METFORMIN HCL ER 500 MG PO TB24: 500.0000 mg | ORAL_TABLET | Freq: Every day | ORAL | 0 refills | Status: DC

## 2018-09-07 NOTE — ED Notes (Signed)
Patient states she is ready to go home.   

## 2018-09-07 NOTE — ED Notes (Signed)
Patient states she wants to go home EDP messaged-2nd 500 bag infused for total 2 liters NS

## 2018-09-07 NOTE — Discharge Instructions (Addendum)
Your CT today was within normal limits.  Please schedule an appointment with your primary care physician in order to further manage your diabetes along with going back on your medication.  If you experience any chest pain, shortness of breath or worsening symptoms you may return to the emergency department.

## 2018-09-07 NOTE — ED Notes (Signed)
Patient ambulatory to bathroom with no assist and denies dizziness

## 2018-11-18 ENCOUNTER — Emergency Department (HOSPITAL_BASED_OUTPATIENT_CLINIC_OR_DEPARTMENT_OTHER): Payer: BC Managed Care – PPO

## 2018-11-18 ENCOUNTER — Other Ambulatory Visit: Payer: Self-pay

## 2018-11-18 ENCOUNTER — Encounter (HOSPITAL_BASED_OUTPATIENT_CLINIC_OR_DEPARTMENT_OTHER): Payer: Self-pay | Admitting: Emergency Medicine

## 2018-11-18 ENCOUNTER — Emergency Department (HOSPITAL_BASED_OUTPATIENT_CLINIC_OR_DEPARTMENT_OTHER)
Admission: EM | Admit: 2018-11-18 | Discharge: 2018-11-18 | Disposition: A | Discharge status: Home / Self Care | Payer: BC Managed Care – PPO | Attending: Emergency Medicine | Admitting: Emergency Medicine

## 2018-11-18 DIAGNOSIS — R109 Unspecified abdominal pain: Secondary | ICD-10-CM | POA: Diagnosis not present

## 2018-11-18 DIAGNOSIS — I10 Essential (primary) hypertension: Secondary | ICD-10-CM | POA: Insufficient documentation

## 2018-11-18 DIAGNOSIS — B349 Viral infection, unspecified: Secondary | ICD-10-CM

## 2018-11-18 DIAGNOSIS — Z8673 Personal history of transient ischemic attack (TIA), and cerebral infarction without residual deficits: Secondary | ICD-10-CM | POA: Insufficient documentation

## 2018-11-18 DIAGNOSIS — F17210 Nicotine dependence, cigarettes, uncomplicated: Secondary | ICD-10-CM | POA: Diagnosis not present

## 2018-11-18 DIAGNOSIS — K5900 Constipation, unspecified: Secondary | ICD-10-CM | POA: Diagnosis not present

## 2018-11-18 DIAGNOSIS — R1032 Left lower quadrant pain: Secondary | ICD-10-CM | POA: Diagnosis not present

## 2018-11-18 DIAGNOSIS — R059 Cough, unspecified: Secondary | ICD-10-CM

## 2018-11-18 DIAGNOSIS — E860 Dehydration: Secondary | ICD-10-CM | POA: Diagnosis not present

## 2018-11-18 DIAGNOSIS — R05 Cough: Secondary | ICD-10-CM | POA: Diagnosis not present

## 2018-11-18 DIAGNOSIS — E119 Type 2 diabetes mellitus without complications: Secondary | ICD-10-CM | POA: Diagnosis not present

## 2018-11-18 DIAGNOSIS — Z20828 Contact with and (suspected) exposure to other viral communicable diseases: Secondary | ICD-10-CM | POA: Diagnosis not present

## 2018-11-18 LAB — CBC
HCT: 47.5 % — ABNORMAL HIGH (ref 36.0–46.0)
Hemoglobin: 15.7 g/dL — ABNORMAL HIGH (ref 12.0–15.0)
MCH: 30.8 pg (ref 26.0–34.0)
MCHC: 33.1 g/dL (ref 30.0–36.0)
MCV: 93.1 fL (ref 80.0–100.0)
Platelets: 243 10*3/uL (ref 150–400)
RBC: 5.1 MIL/uL (ref 3.87–5.11)
RDW: 12.9 % (ref 11.5–15.5)
WBC: 13.7 10*3/uL — ABNORMAL HIGH (ref 4.0–10.5)
nRBC: 0 % (ref 0.0–0.2)

## 2018-11-18 LAB — COMPREHENSIVE METABOLIC PANEL
ALT: 15 U/L (ref 0–44)
AST: 18 U/L (ref 15–41)
Albumin: 3.8 g/dL (ref 3.5–5.0)
Alkaline Phosphatase: 105 U/L (ref 38–126)
Anion gap: 11 (ref 5–15)
BUN: 10 mg/dL (ref 6–20)
CO2: 21 mmol/L — ABNORMAL LOW (ref 22–32)
Calcium: 9 mg/dL (ref 8.9–10.3)
Chloride: 97 mmol/L — ABNORMAL LOW (ref 98–111)
Creatinine, Ser: 0.85 mg/dL (ref 0.44–1.00)
GFR calc Af Amer: >60 mL/min (ref >60)
GFR calc non Af Amer: >60 mL/min (ref >60)
Glucose, Bld: 428 mg/dL — ABNORMAL HIGH (ref 70–99)
Potassium: 3.8 mmol/L (ref 3.5–5.1)
Sodium: 129 mmol/L — ABNORMAL LOW (ref 135–145)
Total Bilirubin: 0.6 mg/dL (ref 0.3–1.2)
Total Protein: 7.1 g/dL (ref 6.5–8.1)

## 2018-11-18 LAB — URINALYSIS, MICROSCOPIC (REFLEX)

## 2018-11-18 LAB — URINALYSIS, ROUTINE W REFLEX MICROSCOPIC
Bilirubin Urine: NEGATIVE
Glucose, UA: >=500 mg/dL — AB
Hgb urine dipstick: NEGATIVE
Ketones, ur: NEGATIVE mg/dL
Leukocytes,Ua: NEGATIVE
Nitrite: NEGATIVE
Protein, ur: NEGATIVE mg/dL
Specific Gravity, Urine: <1.005 — ABNORMAL LOW (ref 1.005–1.030)
pH: 6 (ref 5.0–8.0)

## 2018-11-18 LAB — LIPASE, BLOOD: Lipase: 33 U/L (ref 11–51)

## 2018-11-18 LAB — LACTIC ACID, PLASMA: Lactic Acid, Venous: 2.4 mmol/L (ref 0.5–1.9)

## 2018-11-18 MED ORDER — FENTANYL CITRATE (PF) 100 MCG/2ML IJ SOLN
25.0000 ug | Freq: Once | INTRAMUSCULAR | Status: AC
  Administered 2018-11-18: 25 ug via INTRAVENOUS
  Filled 2018-11-18: qty 2

## 2018-11-18 MED ORDER — IOHEXOL 300 MG/ML  SOLN
100.0000 mL | Freq: Once | INTRAMUSCULAR | Status: AC | PRN
  Administered 2018-11-18: 100 mL via INTRAVENOUS

## 2018-11-18 MED ORDER — SODIUM CHLORIDE 0.9 % IV BOLUS
1000.0000 mL | Freq: Once | INTRAVENOUS | Status: AC
  Administered 2018-11-18: 1000 mL via INTRAVENOUS

## 2018-11-18 NOTE — ED Provider Notes (Signed)
[facility] Emergency Department Provider Note MRN:  [id number]  Arrival date & time: 11/18/18     Chief Complaint   Abdominal Pain and Dizziness   History of Present Illness   [name] is a 53 y.o. year-old female with a history of diabetes, DVT, stroke presenting to the ED with chief complaint of abdominal pain.  Patient began experiencing cold-like symptoms 1 week ago.  Cough, malaise, low energy, dull headache.  Has a son who also had the symptoms.  Some testing negative for coronavirus.  Today patient began experiencing diffuse abdominal pain, worst in the left lower quadrant.  Denies fever, no chest pain or shortness of breath.  Pain is moderate, worse with palpation.  Nausea but no vomiting, no diarrhea, no constipation.  Review of Systems  A complete 10 system review of systems was obtained and all systems are negative except as noted in the HPI and PMH.   Patient's Health History    Past Medical History:  Diagnosis Date  . Acid reflux   . Adrenal benign tumor    Apparently enlarging and patient "is supposed to have it taken out"  . Anxiety   . Depression   . Diabetes mellitus without complication ([place])   . DVT (deep vein thrombosis) in pregnancy   . Hypertension   . PE (pulmonary embolism)   . Stroke [facility])     Past Surgical History:  Procedure Laterality Date  . LOOP RECORDER IMPLANT N/A 03/25/2014   Procedure: LOOP RECORDER IMPLANT;  Surgeon: [name], MD;  Location: [facility] CATH LAB;  Service: Cardiovascular;  Laterality: N/A;  . NO PAST SURGERIES    . TEE WITHOUT CARDIOVERSION N/A 03/25/2014   Procedure: TRANSESOPHAGEAL ECHOCARDIOGRAM (TEE);  Surgeon: [name], MD;  Location: [facility] ENDOSCOPY;  Service: Cardiovascular;  Laterality: N/A;    Family History  Problem Relation Age of Onset  . Heart attack Father 33    Social History   Socioeconomic History  . Marital status: Married    Spouse name: Richard  . Number of  children: 2  . Years of education: HS  . Highest education level: Not on file  Occupational History    Employer: OTHER    Comment: n/a  Social Needs  . Financial resource strain: Not on file  . Food insecurity    Worry: Not on file    Inability: Not on file  . Transportation needs    Medical: Not on file    Non-medical: Not on file  Tobacco Use  . Smoking status: Current Every Day Smoker    Packs/day: 1.00    Years: 40.00    Pack years: 40.00    Types: Cigarettes  . Smokeless tobacco: Never Used  Substance and Sexual Activity  . Alcohol use: No    Alcohol/week: 0.0 standard drinks  . Drug use: No  . Sexual activity: Not on file  Lifestyle  . Physical activity    Days per week: Not on file    Minutes per session: Not on file  . Stress: Not on file  Relationships  . Social Herbalist on phone: Not on file    Gets together: Not on file    Attends religious service: Not on file    Active member of club or organization: Not on file    Attends meetings of clubs or organizations: Not on file    Relationship status: Not on file  . Intimate partner violence  Fear of current or ex partner: Not on file    Emotionally abused: Not on file    Physically abused: Not on file    Forced sexual activity: Not on file  Other Topics Concern  . Not on file  Social History Narrative   Patient lives at home with spouse.   Caffeine Use: 2 cups daily     Physical Exam  Vital Signs and Nursing Notes reviewed Vitals:   11/18/18 2100 11/18/18 2130  BP: 109/73 111/74  Pulse: 97 95  Resp: 15 18  Temp:    SpO2: 97% 98%    CONSTITUTIONAL: Chronically ill-appearing, NAD NEURO:  Alert and oriented x 3, no focal deficits EYES:  eyes equal and reactive ENT/NECK:  no LAD, no JVD CARDIO: Regular rate, well-perfused, normal S1 and S2 PULM:  CTAB no wheezing or rhonchi GI/GU:  normal bowel sounds, non-distended, diffuse abdominal tenderness, guarding to the left lower  quadrant MSK/SPINE:  No gross deformities, no edema SKIN:  no rash, atraumatic PSYCH:  Appropriate speech and behavior  Diagnostic and Interventional Summary    EKG Interpretation  Date/Time:  Tuesday November 18 2018 18:08:02 EST Ventricular Rate:  108 PR Interval:    QRS Duration: 67 QT Interval:  325 QTC Calculation: 436 R Axis:   -34 Text Interpretation: Sinus tachycardia Left axis deviation Low voltage, precordial leads Confirmed by Gerlene Fee [phone]) on 11/18/2018 6:19:07 PM      Labs Reviewed  CBC - Abnormal; Notable for the following components:      Result Value   WBC 13.7 (*)    Hemoglobin 15.7 (*)    HCT 47.5 (*)    All other components within normal limits  COMPREHENSIVE METABOLIC PANEL - Abnormal; Notable for the following components:   Sodium 129 (*)    Chloride 97 (*)    CO2 21 (*)    Glucose, Bld 428 (*)    All other components within normal limits  LACTIC ACID, PLASMA - Abnormal; Notable for the following components:   Lactic Acid, Venous 2.4 (*)    All other components within normal limits  URINALYSIS, ROUTINE W REFLEX MICROSCOPIC - Abnormal; Notable for the following components:   Specific Gravity, Urine <1.005 (*)    Glucose, UA >=500 (*)    All other components within normal limits  URINALYSIS, MICROSCOPIC (REFLEX) - Abnormal; Notable for the following components:   Bacteria, UA FEW (*)    All other components within normal limits  SARS CORONAVIRUS 2 (TAT 6-24 HRS)  LIPASE, BLOOD    CT Abdomen Pelvis W Contrast  Final Result    XR Chest Single View  Final Result      Medications  sodium chloride 0.9 % bolus 1,000 mL (0 mLs Intravenous Stopped 11/18/18 2112)  fentaNYL (SUBLIMAZE) injection 25 mcg (25 mcg Intravenous Given 11/18/18 1836)  iohexol (OMNIPAQUE) 300 MG/ML solution 100 mL (100 mLs Intravenous Contrast Given 11/18/18 2010)     Procedures  /  Critical Care Procedures  ED Course and Medical Decision Making  I have reviewed  the triage vital signs and the nursing notes.  Pertinent labs & imaging results that were available during my care of the patient were reviewed by me and considered in my medical decision making (see below for details).     Considering viral illness, coronavirus, diverticulitis, work-up pending.  Labs reveal mild hyperglycemia with hyponatremia, minimally elevated lactate.  Provided 1 L IV fluids.  CT is without acute findings, discomfort and likely  related to the constipation noted on CT.  Patient is feeling better, no indication for further testing or admission, appropriate for discharge with return precautions.  [name] was evaluated in Emergency Department on 11/18/2018 for the symptoms described in the history of present illness. She was evaluated in the context of the global COVID-19 pandemic, which necessitated consideration that the patient might be at risk for infection with the SARS-CoV-2 virus that causes COVID-19. Institutional protocols and algorithms that pertain to the evaluation of patients at risk for COVID-19 are in a state of rapid change based on information released by regulatory bodies including the CDC and federal and state organizations. These policies and algorithms were followed during the patient's care in the ED.   Barth Kirks. Sedonia Small, MD [place] [email or web address]  Final Clinical Impressions(s) / ED Diagnoses     ICD-10-CM   1. Viral illness  B34.9   2. Cough  R05 XR Chest Single View    XR Chest Single View  3. Constipation, unspecified constipation type  K59.00   4. Dehydration  E86.0     ED Discharge Orders    None       Discharge Instructions Discussed with and Provided to Patient:     Discharge Instructions     You were evaluated in the Emergency Department and after careful evaluation, we did not find any emergent condition requiring admission or further testing in the hospital.  Your  exam/testing today was overall reassuring.  We suspect your symptoms are related to a virus, possibly the coronavirus.  Please use MiraLAX for constipation at home.  We have tested you for the coronavirus here in the Emergency Department.  Please isolate or quarantine at home until you receive a negative test result.  If positive, we recommend continued home quarantine per [facility] recommendations.   Please return to the Emergency Department if you experience any worsening of your condition.  We encourage you to follow up with a primary care provider.  Thank you for allowing [place] to be a part of your care.        Maudie Flakes, MD 11/18/18 2201

## 2018-11-18 NOTE — ED Notes (Signed)
Date and time results received: 11/18/18 1921  Test: lactic acid Critical Value: 2.4  Name of Provider Notified: Dr. Sedonia Small  Orders Received? Or Actions Taken?: no new orders

## 2018-11-18 NOTE — ED Notes (Signed)
ED Provider at bedside. 

## 2018-11-18 NOTE — Discharge Instructions (Signed)
You were evaluated in the Emergency Department and after careful evaluation, we did not find any emergent condition requiring admission or further testing in the hospital.  Your exam/testing today was overall reassuring.  We suspect your symptoms are related to a virus, possibly the coronavirus.  Please use MiraLAX for constipation at home.  We have tested you for the coronavirus here in the Emergency Department.  Please isolate or quarantine at home until you receive a negative test result.  If positive, we recommend continued home quarantine per [facility] recommendations.   Please return to the Emergency Department if you experience any worsening of your condition.  We encourage you to follow up with a primary care provider.  Thank you for allowing [place] to be a part of your care.

## 2018-11-18 NOTE — ED Triage Notes (Signed)
Pt reports dizziness and low abd pain today, swelling to shoulders and upper back. Denies N/V.

## 2018-11-19 LAB — SARS CORONAVIRUS 2 (TAT 6-24 HRS): SARS Coronavirus 2: NEGATIVE

## 2018-11-21 ENCOUNTER — Emergency Department (HOSPITAL_BASED_OUTPATIENT_CLINIC_OR_DEPARTMENT_OTHER): Payer: BC Managed Care – PPO

## 2018-11-21 ENCOUNTER — Encounter (HOSPITAL_BASED_OUTPATIENT_CLINIC_OR_DEPARTMENT_OTHER): Payer: Self-pay

## 2018-11-21 ENCOUNTER — Other Ambulatory Visit: Payer: Self-pay

## 2018-11-21 ENCOUNTER — Emergency Department (HOSPITAL_BASED_OUTPATIENT_CLINIC_OR_DEPARTMENT_OTHER)
Admission: EM | Admit: 2018-11-21 | Discharge: 2018-11-21 | Disposition: A | Discharge status: Home / Self Care | Payer: BC Managed Care – PPO | Attending: Emergency Medicine | Admitting: Emergency Medicine

## 2018-11-21 DIAGNOSIS — F17210 Nicotine dependence, cigarettes, uncomplicated: Secondary | ICD-10-CM | POA: Insufficient documentation

## 2018-11-21 DIAGNOSIS — I10 Essential (primary) hypertension: Secondary | ICD-10-CM | POA: Insufficient documentation

## 2018-11-21 DIAGNOSIS — R05 Cough: Secondary | ICD-10-CM | POA: Diagnosis not present

## 2018-11-21 DIAGNOSIS — R739 Hyperglycemia, unspecified: Secondary | ICD-10-CM

## 2018-11-21 DIAGNOSIS — Z79899 Other long term (current) drug therapy: Secondary | ICD-10-CM | POA: Insufficient documentation

## 2018-11-21 DIAGNOSIS — E1165 Type 2 diabetes mellitus with hyperglycemia: Secondary | ICD-10-CM | POA: Insufficient documentation

## 2018-11-21 DIAGNOSIS — R079 Chest pain, unspecified: Secondary | ICD-10-CM | POA: Diagnosis not present

## 2018-11-21 LAB — COMPREHENSIVE METABOLIC PANEL
ALT: 11 U/L (ref 0–44)
AST: 13 U/L — ABNORMAL LOW (ref 15–41)
Albumin: 3.7 g/dL (ref 3.5–5.0)
Alkaline Phosphatase: 109 U/L (ref 38–126)
Anion gap: 10 (ref 5–15)
BUN: 9 mg/dL (ref 6–20)
CO2: 22 mmol/L (ref 22–32)
Calcium: 9 mg/dL (ref 8.9–10.3)
Chloride: 102 mmol/L (ref 98–111)
Creatinine, Ser: 0.84 mg/dL (ref 0.44–1.00)
GFR calc Af Amer: >60 mL/min (ref >60)
GFR calc non Af Amer: >60 mL/min (ref >60)
Glucose, Bld: 440 mg/dL — ABNORMAL HIGH (ref 70–99)
Potassium: 4.1 mmol/L (ref 3.5–5.1)
Sodium: 134 mmol/L — ABNORMAL LOW (ref 135–145)
Total Bilirubin: 0.6 mg/dL (ref 0.3–1.2)
Total Protein: 7.1 g/dL (ref 6.5–8.1)

## 2018-11-21 LAB — CBC WITH DIFFERENTIAL/PLATELET
Abs Immature Granulocytes: 0.05 10*3/uL (ref 0.00–0.07)
Basophils Absolute: 0.1 10*3/uL (ref 0.0–0.1)
Basophils Relative: 1 %
Eosinophils Absolute: 0.2 10*3/uL (ref 0.0–0.5)
Eosinophils Relative: 1 %
HCT: 46.8 % — ABNORMAL HIGH (ref 36.0–46.0)
Hemoglobin: 15.1 g/dL — ABNORMAL HIGH (ref 12.0–15.0)
Immature Granulocytes: 0 %
Lymphocytes Relative: 21 %
Lymphs Abs: 2.6 10*3/uL (ref 0.7–4.0)
MCH: 30.5 pg (ref 26.0–34.0)
MCHC: 32.3 g/dL (ref 30.0–36.0)
MCV: 94.5 fL (ref 80.0–100.0)
Monocytes Absolute: 0.6 10*3/uL (ref 0.1–1.0)
Monocytes Relative: 5 %
Neutro Abs: 9.2 10*3/uL — ABNORMAL HIGH (ref 1.7–7.7)
Neutrophils Relative %: 72 %
Platelets: 193 10*3/uL (ref 150–400)
RBC: 4.95 MIL/uL (ref 3.87–5.11)
RDW: 13 % (ref 11.5–15.5)
WBC: 12.8 10*3/uL — ABNORMAL HIGH (ref 4.0–10.5)
nRBC: 0 % (ref 0.0–0.2)

## 2018-11-21 LAB — CBG MONITORING, ED
Glucose-Capillary: 459 mg/dL — ABNORMAL HIGH (ref 70–99)
Glucose-Capillary: 314 mg/dL — ABNORMAL HIGH (ref 70–99)
Glucose-Capillary: 173 mg/dL — ABNORMAL HIGH (ref 70–99)

## 2018-11-21 LAB — URINALYSIS, ROUTINE W REFLEX MICROSCOPIC
Bilirubin Urine: NEGATIVE
Glucose, UA: >=500 mg/dL — AB
Hgb urine dipstick: NEGATIVE
Ketones, ur: NEGATIVE mg/dL
Leukocytes,Ua: NEGATIVE
Nitrite: NEGATIVE
Protein, ur: NEGATIVE mg/dL
Specific Gravity, Urine: 1.01 (ref 1.005–1.030)
pH: 5.5 (ref 5.0–8.0)

## 2018-11-21 LAB — LIPASE, BLOOD: Lipase: 31 U/L (ref 11–51)

## 2018-11-21 LAB — URINALYSIS, MICROSCOPIC (REFLEX): WBC, UA: NONE SEEN WBC/hpf (ref 0–5)

## 2018-11-21 LAB — LACTIC ACID, PLASMA: Lactic Acid, Venous: 2.1 mmol/L (ref 0.5–1.9)

## 2018-11-21 MED ORDER — SODIUM CHLORIDE 0.9 % IV BOLUS
1000.0000 mL | Freq: Once | INTRAVENOUS | Status: AC
  Administered 2018-11-21: 1000 mL via INTRAVENOUS

## 2018-11-21 MED ORDER — ACETAMINOPHEN 325 MG PO TABS
650.0000 mg | ORAL_TABLET | Freq: Once | ORAL | Status: AC
  Administered 2018-11-21: 650 mg via ORAL
  Filled 2018-11-21: qty 2

## 2018-11-21 MED ORDER — SODIUM CHLORIDE 0.9 % IV SOLN
INTRAVENOUS | Status: DC
  Administered 2018-11-21: 17:00:00 via INTRAVENOUS

## 2018-11-21 MED ORDER — INSULIN REGULAR HUMAN 100 UNIT/ML IJ SOLN
10.0000 [IU] | Freq: Once | INTRAMUSCULAR | Status: AC
  Administered 2018-11-21: 10 [IU] via INTRAVENOUS
  Filled 2018-11-21: qty 1

## 2018-11-21 NOTE — ED Notes (Signed)
Left side IV infiltrated.  IV restarted in right AC.  IV Infusions taken off interop and programmed manually due to change in site.

## 2018-11-21 NOTE — ED Triage Notes (Signed)
Diabetic pt checked CBG at home @ 576, adb pain no n/v, d started this am. Headache with blurry vision, lightheaded feeling like she may pass out.

## 2018-11-21 NOTE — Discharge Instructions (Addendum)
Blood sugars improved here.  Make an appointment to follow-up with the wellness clinic or reviews the referral information and your discharge instructions to help find a doctor.  I would recommend real strict diet control for now and continue your current medications.  Return for any new or worse symptoms.  Also would recommend increasing your Glucophage ER medication to 1000 mg daily that is up from the 500 mg daily.  Usually they recommend taking this in the evening.

## 2018-11-21 NOTE — ED Notes (Signed)
Pt took metformin this am approx 3 hours ago, checked CBG at home 45 mins ago @ 576

## 2018-11-21 NOTE — ED Provider Notes (Signed)
[place] EMERGENCY DEPARTMENT Provider Note   CSN: [id number] Arrival date & time: 11/21/18  1539     History   Chief Complaint Chief Complaint  Patient presents with  . Hyperglycemia    HPI [name] is a 53 y.o. female.     Patient seen November 10 for dizziness and lower abdominal pain.  Patient work-up at that time revealed that she had an elevated lactic acid at hyperglycemia.  Patient has a known history of diabetes has been on Metformin Exar since August.  500 mg once a day.  Patient's been getting high blood sugars at home.  In the 576 range.  No nausea or vomiting.  Mild headache with blurry vision.  And lightheaded.  Patient feels better upon arrival here.  No fevers no upper respiratory symptoms.  Patient's visit from November 10 reviewed as well as the labs.     Past Medical History:  Diagnosis Date  . Acid reflux   . Adrenal benign tumor    Apparently enlarging and patient "is supposed to have it taken out"  . Anxiety   . Depression   . Diabetes mellitus without complication ([place])   . DVT (deep vein thrombosis) in pregnancy   . Hypertension   . PE (pulmonary embolism)   . Stroke [facility])     Patient Active Problem List   Diagnosis Date Noted  . CKD (chronic kidney disease), stage III 05/17/2016  . Type 2 diabetes mellitus with vascular disease ([place]) 05/17/2016  . Essential hypertension 05/17/2016  . DVT (deep venous thrombosis) ([place]) 05/16/2016  . Chronic ischemic left PCA stroke 07/30/2013  . TIA (transient ischemic attack) 07/30/2013  . Posterior circulation stroke of uncertain pathology ([place]) 07/30/2013    Past Surgical History:  Procedure Laterality Date  . LOOP RECORDER IMPLANT N/A 03/25/2014   Procedure: LOOP RECORDER IMPLANT;  Surgeon: [name], MD;  Location: [facility] CATH LAB;  Service: Cardiovascular;  Laterality: N/A;  . NO PAST SURGERIES    . TEE WITHOUT CARDIOVERSION N/A 03/25/2014   Procedure: TRANSESOPHAGEAL  ECHOCARDIOGRAM (TEE);  Surgeon: [name], MD;  Location: [facility] ENDOSCOPY;  Service: Cardiovascular;  Laterality: N/A;     OB History    Gravida  2   Para  2   Term  2   Preterm  0   AB  0   Living  2     SAB  0   TAB  0   Ectopic  0   Multiple  0   Live Births               Home Medications    Prior to Admission medications   Medication Sig Start Date End Date Taking? Authorizing Provider  metFORMIN (GLUCOPHAGE-XR) 500 MG 24 hr tablet Take 1 tablet (500 mg total) by mouth daily with breakfast. 09/07/18  Yes Cardama, Grayce Sessions, MD  acetaminophen (TYLENOL) 500 MG tablet Take 1,000 mg by mouth every 6 (six) hours as needed for moderate pain.    [provider]  clopidogrel (PLAVIX) 75 MG tablet Take 1 tablet (75 mg total) by mouth daily. 09/07/18   Fatima Blank, MD  diphenhydramine-acetaminophen (TYLENOL PM) 25-500 MG TABS tablet Take 2 tablets by mouth at bedtime as needed (sleep).    [provider]  FARXIGA 10 MG TABS tablet Take 10 mg by mouth daily. 03/14/16   [provider]  fenofibrate 160 MG tablet Take 1 tablet (160 mg total) by mouth  daily. 07/31/13   [name], MD  lisinopril (ZESTRIL) 20 MG tablet Take 1 tablet (20 mg total) by mouth daily. 09/07/18 10/07/18  Fatima Blank, MD  ondansetron (ZOFRAN) 4 MG tablet Take 1 tablet (4 mg total) by mouth every 6 (six) hours as needed for nausea or vomiting. 09/07/18   Cardama, Grayce Sessions, MD  PARoxetine (PAXIL) 40 MG tablet Take 40 mg by mouth every morning.    [provider]  pravastatin (PRAVACHOL) 10 MG tablet Take 10 mg by mouth daily.  03/08/14   [provider]    Family History Family History  Problem Relation Age of Onset  . Heart attack Father 39    Social History Social History   Tobacco Use  . Smoking status: Current Every Day Smoker    Packs/day: 1.00    Years: 40.00    Pack years: 40.00    Types: Cigarettes  . Smokeless  tobacco: Never Used  Substance Use Topics  . Alcohol use: No    Alcohol/week: 0.0 standard drinks  . Drug use: No     Allergies   Amoxicillin, Aspirin, Codeine, and Penicillins   Review of Systems Review of Systems  Constitutional: Negative for chills and fever.  HENT: Negative for congestion, rhinorrhea and sore throat.   Eyes: Positive for visual disturbance.  Respiratory: Negative for cough and shortness of breath.   Cardiovascular: Negative for chest pain and leg swelling.  Gastrointestinal: Positive for abdominal pain. Negative for diarrhea, nausea and vomiting.  Genitourinary: Negative for dysuria.  Musculoskeletal: Negative for back pain and neck pain.  Skin: Negative for rash.  Neurological: Positive for light-headedness and headaches. Negative for dizziness and syncope.  Hematological: Does not bruise/bleed easily.  Psychiatric/Behavioral: Negative for confusion.     Physical Exam Updated Vital Signs BP 105/73 (BP Location: Right Arm)   Pulse 94   Temp 99.5 F (37.5 C) (Oral)   Resp 20   Ht 1.676 m (5\' 6" )   Wt 102.1 kg   LMP  (LMP Unknown) Comment: "a few months"  SpO2 97%   BMI 36.32 kg/m   Physical Exam Vitals signs and nursing note reviewed.  Constitutional:      General: She is not in acute distress.    Appearance: Normal appearance. She is well-developed.  HENT:     Head: Normocephalic and atraumatic.  Eyes:     Extraocular Movements: Extraocular movements intact.     Conjunctiva/sclera: Conjunctivae normal.     Pupils: Pupils are equal, round, and reactive to light.  Neck:     Musculoskeletal: Normal range of motion and neck supple.  Cardiovascular:     Rate and Rhythm: Normal rate and regular rhythm.     Heart sounds: No murmur.  Pulmonary:     Effort: Pulmonary effort is normal. No respiratory distress.     Breath sounds: Normal breath sounds.  Abdominal:     Palpations: Abdomen is soft.     Tenderness: There is no abdominal  tenderness.  Musculoskeletal: Normal range of motion.  Skin:    General: Skin is warm and dry.     Capillary Refill: Capillary refill takes less than 2 seconds.  Neurological:     General: No focal deficit present.     Mental Status: She is alert and oriented to person, place, and time.     Cranial Nerves: No cranial nerve deficit.     Motor: No weakness.      ED Treatments / Results  Labs (all labs ordered are listed, but only abnormal results are displayed) Labs Reviewed  COMPREHENSIVE METABOLIC PANEL - Abnormal; Notable for the following components:      Result Value   Sodium 134 (*)    Glucose, Bld 440 (*)    AST 13 (*)    All other components within normal limits  CBC WITH DIFFERENTIAL/PLATELET - Abnormal; Notable for the following components:   WBC 12.8 (*)    Hemoglobin 15.1 (*)    HCT 46.8 (*)    Neutro Abs 9.2 (*)    All other components within normal limits  LACTIC ACID, PLASMA - Abnormal; Notable for the following components:   Lactic Acid, Venous 2.1 (*)    All other components within normal limits  URINALYSIS, ROUTINE W REFLEX MICROSCOPIC - Abnormal; Notable for the following components:   Glucose, UA >=500 (*)    All other components within normal limits  URINALYSIS, MICROSCOPIC (REFLEX) - Abnormal; Notable for the following components:   Bacteria, UA RARE (*)    All other components within normal limits  CBG MONITORING, ED - Abnormal; Notable for the following components:   Glucose-Capillary 459 (*)    All other components within normal limits  CBG MONITORING, ED - Abnormal; Notable for the following components:   Glucose-Capillary 314 (*)    All other components within normal limits  CBG MONITORING, ED - Abnormal; Notable for the following components:   Glucose-Capillary 173 (*)    All other components within normal limits  LIPASE, BLOOD    EKG None  Radiology Dg Chest 2 View  Result Date: 11/21/2018 CLINICAL DATA:  Cough, chest pain EXAM:  CHEST - 2 VIEW COMPARISON:  11/18/2018 FINDINGS: Heart and mediastinal contours are within normal limits. No focal opacities or effusions. No acute bony abnormality. IMPRESSION: No active cardiopulmonary disease. Electronically Signed   By: Rolm Baptise M.D.   On: 11/21/2018 19:06    Procedures Procedures (including critical care time)  Medications Ordered in ED Medications  0.9 %  sodium chloride infusion ( Intravenous New Bag/Given 11/21/18 1719)  sodium chloride 0.9 % bolus 1,000 mL (0 mLs Intravenous Stopped 11/21/18 1950)  insulin regular (NOVOLIN R) 100 units/mL injection 10 Units (10 Units Intravenous Given 11/21/18 1823)  acetaminophen (TYLENOL) tablet 650 mg (650 mg Oral Given 11/21/18 2014)     Initial Impression / Assessment and Plan / ED Course  I have reviewed the triage vital signs and the nursing notes.  Pertinent labs & imaging results that were available during my care of the patient were reviewed by me and considered in my medical decision making (see chart for details).        Patient blood sugar here in the 400 range.  Patient given fluids.  Blood sugar came down into 300 range.  Patient given 10 units of regular insulin.  Blood sugar now down below 200.  Patient feeling much better.  Labs still showed persistent lactic acid.  Still has a leukocytosis but it is lower than it was before.  Lactic acid is below 4.  Chest x-ray negative urinalysis negative.  Feel that lactic acid may be elevated some due to high blood sugars.  Not able to find any evidence of sepsis.  And vital sign parameters without any significant abnormalities.  Temp was 99.  Initially heart rate was 110.  But never had any hypotension.  Chest x-ray negative urinalysis negative in addition patient had CT scan of the abdomen with her visit before  which was negative.  Will discharge patient home with precautions.  She is feeling much better.  Blood sugars under better control.  We will have her increase  her Metformin to 1000 mg a day.  And gave her referral to follow-up with wellness clinic.  Despite patient's lab findings patient clinically appearing very well.  Final Clinical Impressions(s) / ED Diagnoses   Final diagnoses:  Hyperglycemia    ED Discharge Orders    None       Fredia Sorrow, MD 11/21/18 2124

## 2018-11-24 MED FILL — Insulin Regular (Human) Inj 100 Unit/ML: INTRAMUSCULAR | Qty: 0.1 | Status: AC

## 2019-01-16 ENCOUNTER — Encounter (HOSPITAL_BASED_OUTPATIENT_CLINIC_OR_DEPARTMENT_OTHER): Payer: Self-pay | Admitting: *Deleted

## 2019-01-16 ENCOUNTER — Emergency Department (HOSPITAL_BASED_OUTPATIENT_CLINIC_OR_DEPARTMENT_OTHER)
Admission: EM | Admit: 2019-01-16 | Discharge: 2019-01-17 | Disposition: A | Discharge status: Home / Self Care | Payer: BC Managed Care – PPO | Attending: Emergency Medicine | Admitting: Emergency Medicine

## 2019-01-16 ENCOUNTER — Other Ambulatory Visit: Payer: Self-pay

## 2019-01-16 DIAGNOSIS — M79652 Pain in left thigh: Secondary | ICD-10-CM | POA: Insufficient documentation

## 2019-01-16 DIAGNOSIS — E1122 Type 2 diabetes mellitus with diabetic chronic kidney disease: Secondary | ICD-10-CM | POA: Insufficient documentation

## 2019-01-16 DIAGNOSIS — M5416 Radiculopathy, lumbar region: Secondary | ICD-10-CM | POA: Diagnosis not present

## 2019-01-16 DIAGNOSIS — F17210 Nicotine dependence, cigarettes, uncomplicated: Secondary | ICD-10-CM | POA: Insufficient documentation

## 2019-01-16 DIAGNOSIS — Z86711 Personal history of pulmonary embolism: Secondary | ICD-10-CM | POA: Diagnosis not present

## 2019-01-16 DIAGNOSIS — M542 Cervicalgia: Secondary | ICD-10-CM | POA: Diagnosis not present

## 2019-01-16 DIAGNOSIS — N183 Chronic kidney disease, stage 3 unspecified: Secondary | ICD-10-CM | POA: Diagnosis not present

## 2019-01-16 DIAGNOSIS — M5410 Radiculopathy, site unspecified: Secondary | ICD-10-CM | POA: Diagnosis not present

## 2019-01-16 DIAGNOSIS — M545 Low back pain: Secondary | ICD-10-CM | POA: Diagnosis not present

## 2019-01-16 DIAGNOSIS — I1310 Hypertensive heart and chronic kidney disease without heart failure, with stage 1 through stage 4 chronic kidney disease, or unspecified chronic kidney disease: Secondary | ICD-10-CM | POA: Diagnosis not present

## 2019-01-16 DIAGNOSIS — Z7984 Long term (current) use of oral hypoglycemic drugs: Secondary | ICD-10-CM | POA: Diagnosis not present

## 2019-01-16 LAB — URINALYSIS, MICROSCOPIC (REFLEX)

## 2019-01-16 LAB — URINALYSIS, ROUTINE W REFLEX MICROSCOPIC
Bilirubin Urine: NEGATIVE
Glucose, UA: >=500 mg/dL — AB
Hgb urine dipstick: NEGATIVE
Ketones, ur: 15 mg/dL — AB
Leukocytes,Ua: NEGATIVE
Nitrite: NEGATIVE
Protein, ur: NEGATIVE mg/dL
Specific Gravity, Urine: 1.02 (ref 1.005–1.030)
pH: 5.5 (ref 5.0–8.0)

## 2019-01-16 MED ORDER — METHOCARBAMOL 500 MG PO TABS
500.0000 mg | ORAL_TABLET | Freq: Once | ORAL | Status: AC
  Administered 2019-01-16: 500 mg via ORAL
  Filled 2019-01-16: qty 1

## 2019-01-16 MED ORDER — HYDROMORPHONE HCL 1 MG/ML IJ SOLN
1.0000 mg | Freq: Once | INTRAMUSCULAR | Status: AC
  Administered 2019-01-16: 1 mg via INTRAMUSCULAR
  Filled 2019-01-16: qty 1

## 2019-01-16 MED ORDER — KETOROLAC TROMETHAMINE 60 MG/2ML IM SOLN
30.0000 mg | Freq: Once | INTRAMUSCULAR | Status: AC
  Administered 2019-01-16: 30 mg via INTRAMUSCULAR
  Filled 2019-01-16: qty 2

## 2019-01-16 MED ORDER — PREDNISONE 50 MG PO TABS
60.0000 mg | ORAL_TABLET | Freq: Once | ORAL | Status: AC
  Administered 2019-01-16: 60 mg via ORAL
  Filled 2019-01-16: qty 1

## 2019-01-16 NOTE — ED Triage Notes (Signed)
C/o neck pain x 2 days  Denies inj,  Denies fever  States legs are swollen

## 2019-01-16 NOTE — ED Provider Notes (Signed)
Emergency Department Provider Note   I have reviewed the triage vital signs and the nursing notes.   HISTORY  Chief Complaint Neck Pain   HPI [name] is a 54 y.o. female who presents to the emergency department today with multiple symptoms.  Patient initially says that she had a lightening bolt go through her body.  She told nursing that she had neck pain for 2 days and swollen legs.  She subsequently tells me that she has left hip pain.  Is not exactly clear on what brought her in but at this moment she has pain in her left lower back that radiates around to her upper thigh.  She never had that this before.  She states is very sharp in nature and worse with movement.  No recent trauma.  No recent illnesses.   No weakness or numbness in her lower extremities.  No difficulty urinating or defecating.  No other associated or modifying symptoms.    Past Medical History:  Diagnosis Date  . Acid reflux   . Adrenal benign tumor    Apparently enlarging and patient "is supposed to have it taken out"  . Anxiety   . Depression   . Diabetes mellitus without complication ([place])   . DVT (deep vein thrombosis) in pregnancy   . Hypertension   . PE (pulmonary embolism)   . Stroke [facility])     Patient Active Problem List   Diagnosis Date Noted  . CKD (chronic kidney disease), stage III 05/17/2016  . Type 2 diabetes mellitus with vascular disease ([place]) 05/17/2016  . Essential hypertension 05/17/2016  . DVT (deep venous thrombosis) ([place]) 05/16/2016  . Chronic ischemic left PCA stroke 07/30/2013  . TIA (transient ischemic attack) 07/30/2013  . Posterior circulation stroke of uncertain pathology ([place]) 07/30/2013    Past Surgical History:  Procedure Laterality Date  . LOOP RECORDER IMPLANT N/A 03/25/2014   Procedure: LOOP RECORDER IMPLANT;  Surgeon: [name], MD;  Location: [facility] CATH LAB;  Service: Cardiovascular;  Laterality: N/A;  . NO PAST SURGERIES    . TEE WITHOUT  CARDIOVERSION N/A 03/25/2014   Procedure: TRANSESOPHAGEAL ECHOCARDIOGRAM (TEE);  Surgeon: [name], MD;  Location: [facility] ENDOSCOPY;  Service: Cardiovascular;  Laterality: N/A;    Current Outpatient Rx  . Order #: [id number] Class: Historical Med  . Order #: [id number] Class: Normal  . Order #: [id number] Class: Historical Med  . Order #: [id number] Class: Historical Med  . Order #: [id number] Class: Normal  . Order #: [id number] Class: Normal  . Order #: [id number] Class: Print  . Order #: [id number] Class: Normal  . Order #: [id number] Class: Print  . Order #: [id number] Class: Normal  . Order #: [id number] Class: Historical Med  . Order #: [id number] Class: Historical Med  . Order #: [id number] Class: Print    Allergies Amoxicillin, Aspirin, Codeine, and Penicillins  Family History  Problem Relation Age of Onset  . Heart attack Father 44    Social History Social History   Tobacco Use  . Smoking status: Current Every Day Smoker    Packs/day: 1.00    Years: 40.00    Pack years: 40.00    Types: Cigarettes  . Smokeless tobacco: Never Used  Substance Use Topics  . Alcohol use: No    Alcohol/week: 0.0 standard drinks  . Drug use: No    Review of Systems  All other systems negative except as documented in the HPI. All pertinent positives and negatives as reviewed in the HPI. ____________________________________________   PHYSICAL EXAM:  VITAL  SIGNS: ED Triage Vitals  Enc Vitals Group     BP 01/16/19 1831 128/75     Pulse Rate 01/16/19 1831 (!) 117     Resp 01/16/19 1831 18     Temp 01/16/19 1839 98.7 F (37.1 C)     Temp src --      SpO2 01/16/19 1831 96 %     Weight 01/16/19 1832 174 lb 11.2 oz (79.2 kg)     Height 01/16/19 1832 5\' 6"  (1.676 m)    Constitutional: Alert and oriented. Well appearing and in no acute distress. Eyes: Conjunctivae are normal. PERRL. EOMI. Head: Atraumatic. Nose: No congestion/rhinnorhea. Mouth/Throat: Mucous membranes are moist.  Oropharynx  non-erythematous. Neck: No stridor.  No meningeal signs.   Cardiovascular: Normal rate, regular rhythm. Good peripheral circulation. Grossly normal heart sounds.   Respiratory: Normal respiratory effort.  No retractions. Lungs CTAB. Gastrointestinal: Soft and nontender. No distention.  Musculoskeletal: No lower extremity tenderness nor edema. No gross deformities of extremities. Pain in left lower back with ROM, active and passive. Also pain with palpation. Neurologic:  Normal speech and language. No gross focal neurologic deficits are appreciated.  Skin:  Skin is warm, dry and intact. No rash noted.  ____________________________________________   LABS (all labs ordered are listed, but only abnormal results are displayed)  Labs Reviewed  URINALYSIS, ROUTINE W REFLEX MICROSCOPIC - Abnormal; Notable for the following components:      Result Value   Glucose, UA >=500 (*)    Ketones, ur 15 (*)    All other components within normal limits  URINALYSIS, MICROSCOPIC (REFLEX) - Abnormal; Notable for the following components:   Bacteria, UA FEW (*)    All other components within normal limits   ____________________________________________   INITIAL IMPRESSION / ASSESSMENT AND PLAN / ED COURSE  Sciatica vs lower back pain from MSK source. No e/o cauda equina or significant back issues. Will treat symptomatically.  Pain significantly improved in ED with medications. Workup unremarkable. Plan for dc w/ pcp followup.     Pertinent labs & imaging results that were available during my care of the patient were reviewed by me and considered in my medical decision making (see chart for details).  A medical screening exam was performed and I feel the patient has had an appropriate workup for their chief complaint at this time and likelihood of emergent condition existing is low. They have been counseled on decision, discharge, follow up and which symptoms necessitate immediate return to the  emergency department. They or their family verbally stated understanding and agreement with plan and discharged in stable condition.   ____________________________________________  FINAL CLINICAL IMPRESSION(S) / ED DIAGNOSES  Final diagnoses:  Radiculopathy, unspecified spinal region     MEDICATIONS GIVEN DURING THIS VISIT:  Medications  HYDROmorphone (DILAUDID) injection 1 mg (1 mg Intramuscular Given 01/16/19 2323)  predniSONE (DELTASONE) tablet 60 mg (60 mg Oral Given 01/16/19 2324)  methocarbamol (ROBAXIN) tablet 500 mg (500 mg Oral Given 01/16/19 2323)  ketorolac (TORADOL) injection 30 mg (30 mg Intramuscular Given 01/16/19 2324)     NEW OUTPATIENT MEDICATIONS STARTED DURING THIS VISIT:  Discharge Medication List as of 01/17/2019  1:17 AM    START taking these medications   Details  meloxicam (MOBIC) 7.5 MG tablet Take 1 tablet (7.5 mg total) by mouth daily., Starting Sat 01/17/2019, Print    methocarbamol (ROBAXIN) 500 MG tablet Take 1 tablet (500 mg total) by mouth every 8 (eight) hours as needed for muscle spasms.,  Starting Sat 01/17/2019, Print    predniSONE (DELTASONE) 20 MG tablet 3 tabs po daily x 3 days, then 2 tabs x 3 days, then 1.5 tabs x 3 days, then 1 tab x 3 days, then 0.5 tabs x 3 days, Print        Note:  This note was prepared with assistance of Dragon voice recognition software. Occasional wrong-word or sound-a-like substitutions may have occurred due to the inherent limitations of voice recognition software.   Merrily Pew, MD 01/17/19 [phone]

## 2019-01-16 NOTE — ED Notes (Signed)
ED Provider at bedside. 

## 2019-01-17 MED ORDER — METHOCARBAMOL 500 MG PO TABS: 500.0000 mg | ORAL_TABLET | Freq: Three times a day (TID) | ORAL | 0 refills | Status: DC | PRN

## 2019-01-17 MED ORDER — MELOXICAM 7.5 MG PO TABS: 7.5000 mg | ORAL_TABLET | Freq: Every day | ORAL | 0 refills | Status: DC

## 2019-01-17 MED ORDER — PREDNISONE 20 MG PO TABS: ORAL_TABLET | ORAL | 0 refills | Status: DC

## 2019-01-26 DIAGNOSIS — F418 Other specified anxiety disorders: Secondary | ICD-10-CM | POA: Diagnosis not present

## 2019-01-26 DIAGNOSIS — R110 Nausea: Secondary | ICD-10-CM | POA: Diagnosis not present

## 2019-01-26 DIAGNOSIS — E11618 Type 2 diabetes mellitus with other diabetic arthropathy: Secondary | ICD-10-CM | POA: Diagnosis not present

## 2019-01-26 DIAGNOSIS — G43109 Migraine with aura, not intractable, without status migrainosus: Secondary | ICD-10-CM | POA: Diagnosis not present

## 2019-02-06 DIAGNOSIS — R110 Nausea: Secondary | ICD-10-CM | POA: Diagnosis not present

## 2019-02-06 DIAGNOSIS — G43109 Migraine with aura, not intractable, without status migrainosus: Secondary | ICD-10-CM | POA: Diagnosis not present

## 2019-02-06 DIAGNOSIS — M5442 Lumbago with sciatica, left side: Secondary | ICD-10-CM | POA: Diagnosis not present

## 2019-02-06 DIAGNOSIS — R6889 Other general symptoms and signs: Secondary | ICD-10-CM | POA: Diagnosis not present

## 2019-02-17 DIAGNOSIS — R5383 Other fatigue: Secondary | ICD-10-CM | POA: Diagnosis not present

## 2019-02-17 DIAGNOSIS — M542 Cervicalgia: Secondary | ICD-10-CM | POA: Diagnosis not present

## 2019-02-17 DIAGNOSIS — Z1159 Encounter for screening for other viral diseases: Secondary | ICD-10-CM | POA: Diagnosis not present

## 2019-02-17 DIAGNOSIS — Z79899 Other long term (current) drug therapy: Secondary | ICD-10-CM | POA: Diagnosis not present

## 2019-02-17 DIAGNOSIS — M545 Low back pain: Secondary | ICD-10-CM | POA: Diagnosis not present

## 2019-02-17 DIAGNOSIS — F17210 Nicotine dependence, cigarettes, uncomplicated: Secondary | ICD-10-CM | POA: Diagnosis not present

## 2019-02-17 DIAGNOSIS — Z131 Encounter for screening for diabetes mellitus: Secondary | ICD-10-CM | POA: Diagnosis not present

## 2019-02-17 DIAGNOSIS — M129 Arthropathy, unspecified: Secondary | ICD-10-CM | POA: Diagnosis not present

## 2019-02-17 DIAGNOSIS — E559 Vitamin D deficiency, unspecified: Secondary | ICD-10-CM | POA: Diagnosis not present

## 2019-02-17 DIAGNOSIS — M25512 Pain in left shoulder: Secondary | ICD-10-CM | POA: Diagnosis not present

## 2019-02-17 DIAGNOSIS — M25511 Pain in right shoulder: Secondary | ICD-10-CM | POA: Diagnosis not present

## 2019-02-17 DIAGNOSIS — Z1331 Encounter for screening for depression: Secondary | ICD-10-CM | POA: Diagnosis not present

## 2019-02-18 DIAGNOSIS — L03011 Cellulitis of right finger: Secondary | ICD-10-CM | POA: Diagnosis not present

## 2019-02-20 DIAGNOSIS — L03011 Cellulitis of right finger: Secondary | ICD-10-CM | POA: Diagnosis not present

## 2019-02-25 DIAGNOSIS — M25641 Stiffness of right hand, not elsewhere classified: Secondary | ICD-10-CM | POA: Diagnosis not present

## 2019-02-28 ENCOUNTER — Other Ambulatory Visit: Payer: Self-pay

## 2019-02-28 ENCOUNTER — Emergency Department (HOSPITAL_COMMUNITY)
Admission: EM | Admit: 2019-02-28 | Discharge: 2019-02-28 | Disposition: A | Discharge status: Home / Self Care | Payer: BC Managed Care – PPO | Attending: Emergency Medicine | Admitting: Emergency Medicine

## 2019-02-28 ENCOUNTER — Encounter (HOSPITAL_COMMUNITY): Payer: Self-pay | Admitting: Emergency Medicine

## 2019-02-28 DIAGNOSIS — Z7984 Long term (current) use of oral hypoglycemic drugs: Secondary | ICD-10-CM | POA: Diagnosis not present

## 2019-02-28 DIAGNOSIS — F17210 Nicotine dependence, cigarettes, uncomplicated: Secondary | ICD-10-CM | POA: Insufficient documentation

## 2019-02-28 DIAGNOSIS — N183 Chronic kidney disease, stage 3 unspecified: Secondary | ICD-10-CM | POA: Insufficient documentation

## 2019-02-28 DIAGNOSIS — Z86711 Personal history of pulmonary embolism: Secondary | ICD-10-CM | POA: Diagnosis not present

## 2019-02-28 DIAGNOSIS — Z7901 Long term (current) use of anticoagulants: Secondary | ICD-10-CM | POA: Insufficient documentation

## 2019-02-28 DIAGNOSIS — E1122 Type 2 diabetes mellitus with diabetic chronic kidney disease: Secondary | ICD-10-CM | POA: Insufficient documentation

## 2019-02-28 DIAGNOSIS — G8918 Other acute postprocedural pain: Secondary | ICD-10-CM | POA: Diagnosis not present

## 2019-02-28 DIAGNOSIS — I129 Hypertensive chronic kidney disease with stage 1 through stage 4 chronic kidney disease, or unspecified chronic kidney disease: Secondary | ICD-10-CM | POA: Insufficient documentation

## 2019-02-28 DIAGNOSIS — Z79899 Other long term (current) drug therapy: Secondary | ICD-10-CM | POA: Diagnosis not present

## 2019-02-28 DIAGNOSIS — M79644 Pain in right finger(s): Secondary | ICD-10-CM

## 2019-02-28 LAB — CBC WITH DIFFERENTIAL/PLATELET
Abs Immature Granulocytes: 0.04 10*3/uL (ref 0.00–0.07)
Basophils Absolute: 0.1 10*3/uL (ref 0.0–0.1)
Basophils Relative: 1 %
Eosinophils Absolute: 0.3 10*3/uL (ref 0.0–0.5)
Eosinophils Relative: 3 %
HCT: 48.2 % — ABNORMAL HIGH (ref 36.0–46.0)
Hemoglobin: 15.3 g/dL — ABNORMAL HIGH (ref 12.0–15.0)
Immature Granulocytes: 0 %
Lymphocytes Relative: 31 %
Lymphs Abs: 3.5 10*3/uL (ref 0.7–4.0)
MCH: 30.9 pg (ref 26.0–34.0)
MCHC: 31.7 g/dL (ref 30.0–36.0)
MCV: 97.4 fL (ref 80.0–100.0)
Monocytes Absolute: 0.9 10*3/uL (ref 0.1–1.0)
Monocytes Relative: 8 %
Neutro Abs: 6.7 10*3/uL (ref 1.7–7.7)
Neutrophils Relative %: 57 %
Platelets: 326 10*3/uL (ref 150–400)
RBC: 4.95 MIL/uL (ref 3.87–5.11)
RDW: 14.4 % (ref 11.5–15.5)
WBC: 11.5 10*3/uL — ABNORMAL HIGH (ref 4.0–10.5)
nRBC: 0 % (ref 0.0–0.2)

## 2019-02-28 LAB — COMPREHENSIVE METABOLIC PANEL
ALT: 11 U/L (ref 0–44)
AST: 13 U/L — ABNORMAL LOW (ref 15–41)
Albumin: 3.1 g/dL — ABNORMAL LOW (ref 3.5–5.0)
Alkaline Phosphatase: 78 U/L (ref 38–126)
Anion gap: 11 (ref 5–15)
BUN: 13 mg/dL (ref 6–20)
CO2: 21 mmol/L — ABNORMAL LOW (ref 22–32)
Calcium: 8.6 mg/dL — ABNORMAL LOW (ref 8.9–10.3)
Chloride: 104 mmol/L (ref 98–111)
Creatinine, Ser: 1.02 mg/dL — ABNORMAL HIGH (ref 0.44–1.00)
GFR calc Af Amer: >60 mL/min (ref >60)
GFR calc non Af Amer: >60 mL/min (ref >60)
Glucose, Bld: 370 mg/dL — ABNORMAL HIGH (ref 70–99)
Potassium: 4.1 mmol/L (ref 3.5–5.1)
Sodium: 136 mmol/L (ref 135–145)
Total Bilirubin: 0.3 mg/dL (ref 0.3–1.2)
Total Protein: 6.4 g/dL — ABNORMAL LOW (ref 6.5–8.1)

## 2019-02-28 LAB — LACTIC ACID, PLASMA: Lactic Acid, Venous: 1.8 mmol/L (ref 0.5–1.9)

## 2019-02-28 MED ORDER — SODIUM CHLORIDE 0.9% FLUSH: 3.0000 mL | Freq: Once | INTRAVENOUS | Status: DC

## 2019-02-28 NOTE — ED Notes (Signed)
Patient verbalizes understanding of discharge instructions. Opportunity for questioning and answers were provided. Armband removed by staff, pt discharged from ED ambulatory by self\  

## 2019-02-28 NOTE — ED Provider Notes (Signed)
[place] EMERGENCY DEPARTMENT Provider Note   CSN: [id number] Arrival date & time: 02/28/19  2025     History Chief Complaint  Patient presents with  . Recurrent Skin Infections    [name] is a 54 y.o. female presenting for evaluation of right ring finger pain.  Patient states she had surgery on her right ring finger 2 weeks ago.  Few days after surgery, she noticed color change to her finger.  She has been evaluated at the hand surgeons office multiple times for this and surgery.  She is here today due to worsening pain and swelling that is going down her finger.  She denies fevers, chills, nausea vomiting, or weakness.  She has been taking Tylenol/ibuprofen for this, without improvement.  She is a history of diabetes, has been taking her medication as prescribed.    Additional history taken chart review.  Patient with a history of diabetes, hypertension, CVA on Plavix.  HPI     Past Medical History:  Diagnosis Date  . Acid reflux   . Adrenal benign tumor    Apparently enlarging and patient "is supposed to have it taken out"  . Anxiety   . Depression   . Diabetes mellitus without complication ([employer])   . DVT (deep vein thrombosis) in pregnancy   . Hypertension   . PE (pulmonary embolism)   . Stroke [facility])     Patient Active Problem List   Diagnosis Date Noted  . CKD (chronic kidney disease), stage III 05/17/2016  . Type 2 diabetes mellitus with vascular disease ([place]) 05/17/2016  . Essential hypertension 05/17/2016  . DVT (deep venous thrombosis) ([place]) 05/16/2016  . Chronic ischemic left PCA stroke 07/30/2013  . TIA (transient ischemic attack) 07/30/2013  . Posterior circulation stroke of uncertain pathology ([place]) 07/30/2013    Past Surgical History:  Procedure Laterality Date  . LOOP RECORDER IMPLANT N/A 03/25/2014   Procedure: LOOP RECORDER IMPLANT;  Surgeon: [name], MD;  Location: [facility] CATH LAB;  Service: Cardiovascular;   Laterality: N/A;  . NO PAST SURGERIES    . TEE WITHOUT CARDIOVERSION N/A 03/25/2014   Procedure: TRANSESOPHAGEAL ECHOCARDIOGRAM (TEE);  Surgeon: [name], MD;  Location: [facility] ENDOSCOPY;  Service: Cardiovascular;  Laterality: N/A;     OB History    Gravida  2   Para  2   Term  2   Preterm  0   AB  0   Living  2     SAB  0   TAB  0   Ectopic  0   Multiple  0   Live Births              Family History  Problem Relation Age of Onset  . Heart attack Father 62    Social History   Tobacco Use  . Smoking status: Current Every Day Smoker    Packs/day: 1.00    Years: 40.00    Pack years: 40.00    Types: Cigarettes  . Smokeless tobacco: Never Used  Substance Use Topics  . Alcohol use: No    Alcohol/week: 0.0 standard drinks  . Drug use: No    Home Medications Prior to Admission medications   Medication Sig Start Date End Date Taking? Authorizing Provider  acetaminophen (TYLENOL) 500 MG tablet Take 1,000 mg by mouth every 6 (six) hours as needed for moderate pain.    [provider]  clopidogrel (PLAVIX) 75 MG tablet Take 1 tablet (75 mg  total) by mouth daily. 09/07/18   Fatima Blank, MD  diphenhydramine-acetaminophen (TYLENOL PM) 25-500 MG TABS tablet Take 2 tablets by mouth at bedtime as needed (sleep).    [provider]  FARXIGA 10 MG TABS tablet Take 10 mg by mouth daily. 03/14/16   [provider]  fenofibrate 160 MG tablet Take 1 tablet (160 mg total) by mouth daily. 07/31/13   [name], MD  lisinopril (ZESTRIL) 20 MG tablet Take 1 tablet (20 mg total) by mouth daily. 09/07/18 10/07/18  Fatima Blank, MD  meloxicam (MOBIC) 7.5 MG tablet Take 1 tablet (7.5 mg total) by mouth daily. 01/17/19   Mesner, Corene Cornea, MD  metFORMIN (GLUCOPHAGE-XR) 500 MG 24 hr tablet Take 1 tablet (500 mg total) by mouth daily with breakfast. 09/07/18   Cardama, Grayce Sessions, MD  methocarbamol (ROBAXIN) 500 MG tablet Take 1 tablet (500 mg  total) by mouth every 8 (eight) hours as needed for muscle spasms. 01/17/19   Mesner, Corene Cornea, MD  ondansetron (ZOFRAN) 4 MG tablet Take 1 tablet (4 mg total) by mouth every 6 (six) hours as needed for nausea or vomiting. 09/07/18   Cardama, Grayce Sessions, MD  PARoxetine (PAXIL) 40 MG tablet Take 40 mg by mouth every morning.    [provider]  pravastatin (PRAVACHOL) 10 MG tablet Take 10 mg by mouth daily.  03/08/14   [provider]  predniSONE (DELTASONE) 20 MG tablet 3 tabs po daily x 3 days, then 2 tabs x 3 days, then 1.5 tabs x 3 days, then 1 tab x 3 days, then 0.5 tabs x 3 days 01/17/19   Mesner, Corene Cornea, MD    Allergies    Amoxicillin, Aspirin, Codeine, and Penicillins  Review of Systems   Review of Systems  Musculoskeletal:       R ring finger pain and swelling    Physical Exam Updated Vital Signs BP 129/89 (BP Location: Right Arm)   Pulse 99   Temp 98.4 F (36.9 C) (Oral)   Resp 18   Ht 5\' 6"  (1.676 m)   Wt 72.6 kg   LMP  (LMP Unknown) Comment: "a few months"  SpO2 98%   BMI 25.82 kg/m   Physical Exam Vitals and nursing note reviewed.  Constitutional:      General: She is not in acute distress.    Appearance: She is well-developed.  HENT:     Head: Normocephalic and atraumatic.  Pulmonary:     Effort: Pulmonary effort is normal.  Abdominal:     General: There is no distension.  Musculoskeletal:        General: Normal range of motion.     Cervical back: Normal range of motion.     Comments: See pictures below. Mild redness and swelling of mid/proximal ring finger. black skin color changes of distal finger.   Skin:    General: Skin is warm.     Findings: No rash.  Neurological:     Mental Status: She is alert and oriented to person, place, and time.            ED Results / Procedures / Treatments   Labs (all labs ordered are listed, but only abnormal results are displayed) Labs Reviewed  COMPREHENSIVE METABOLIC PANEL - Abnormal; Notable  for the following components:      Result Value   CO2 21 (*)    Glucose, Bld 370 (*)    Creatinine, Ser 1.02 (*)    Calcium 8.6 (*)  Total Protein 6.4 (*)    Albumin 3.1 (*)    AST 13 (*)    All other components within normal limits  CBC WITH DIFFERENTIAL/PLATELET - Abnormal; Notable for the following components:   WBC 11.5 (*)    Hemoglobin 15.3 (*)    HCT 48.2 (*)    All other components within normal limits  LACTIC ACID, PLASMA  URINALYSIS, ROUTINE W REFLEX MICROSCOPIC    EKG None  Radiology No results found.  Procedures Procedures (including critical care time)  Medications Ordered in ED Medications  sodium chloride flush (NS) 0.9 % injection 3 mL (3 mLs Intravenous Not Given 02/28/19 2207)    ED Course  I have reviewed the triage vital signs and the nursing notes.  Pertinent labs & imaging results that were available during my care of the patient were reviewed by me and considered in my medical decision making (see chart for details).    MDM Rules/Calculators/A&P                      Patient presenting for evaluation of worsening right ring finger pain.  Physical exam shows patient with mild swelling and redness of the mid to proximal finger with black skin changes of the distal finger.  Labs obtained from triage by me, mild leukocytosis 11.5, nonspecific.  Patient's blood sugar is elevated at 370, but no DKA.  Heart rate improved without intervention.  Patient was evaluated by Dr. Caralyn Guile with hand surgery who felt patient's worsening pain was due to dressing being too tight.  He did not feel patient needed any further management or work-up regarding her finger in the ED or in the hospital today.  At this time, patient appears safe for discharge.  Return precautions given.  Patient states she understands and agrees to plan.  Final Clinical Impression(s) / ED Diagnoses Final diagnoses:  Finger pain, right    Rx / DC Orders ED Discharge Orders    None        Franchot Heidelberg, PA-C 02/28/19 2303    Maudie Flakes, MD 03/01/19 [phone]

## 2019-02-28 NOTE — Discharge Instructions (Addendum)
Follow the instructions given to you by the hand doctor.  Return to the ER with any new, worsening, or concerning symptoms.

## 2019-02-28 NOTE — ED Triage Notes (Signed)
Pt reports she had surgery on her ring finger on her right hand two weeks ago b/c "it was turning black."  She comes in w/ increased pain, redness and swelling. Pt reports the "blackness is increasing."

## 2019-03-03 DIAGNOSIS — Z4789 Encounter for other orthopedic aftercare: Secondary | ICD-10-CM | POA: Diagnosis not present

## 2019-03-03 DIAGNOSIS — L03011 Cellulitis of right finger: Secondary | ICD-10-CM | POA: Diagnosis not present

## 2019-03-04 ENCOUNTER — Ambulatory Visit (HOSPITAL_COMMUNITY): Payer: BC Managed Care – PPO | Admitting: Anesthesiology

## 2019-03-04 ENCOUNTER — Other Ambulatory Visit: Payer: Self-pay

## 2019-03-04 ENCOUNTER — Encounter (HOSPITAL_COMMUNITY)
Admission: RE | Disposition: A | Discharge status: Home / Self Care | Payer: Self-pay | Source: Ambulatory Visit | Attending: Orthopedic Surgery

## 2019-03-04 ENCOUNTER — Other Ambulatory Visit (HOSPITAL_COMMUNITY)
Admission: RE | Admit: 2019-03-04 | Discharge: 2019-03-04 | Disposition: A | Discharge status: Home / Self Care | Payer: BC Managed Care – PPO | Source: Ambulatory Visit | Attending: Orthopedic Surgery | Admitting: Orthopedic Surgery

## 2019-03-04 ENCOUNTER — Encounter (HOSPITAL_COMMUNITY): Payer: Self-pay | Admitting: Orthopedic Surgery

## 2019-03-04 ENCOUNTER — Ambulatory Visit (HOSPITAL_COMMUNITY)
Admission: RE | Admit: 2019-03-04 | Discharge: 2019-03-04 | Disposition: A | Discharge status: Home / Self Care | Payer: BC Managed Care – PPO | Source: Ambulatory Visit | Attending: Orthopedic Surgery | Admitting: Orthopedic Surgery

## 2019-03-04 DIAGNOSIS — Z7984 Long term (current) use of oral hypoglycemic drugs: Secondary | ICD-10-CM | POA: Diagnosis not present

## 2019-03-04 DIAGNOSIS — E1152 Type 2 diabetes mellitus with diabetic peripheral angiopathy with gangrene: Secondary | ICD-10-CM | POA: Insufficient documentation

## 2019-03-04 DIAGNOSIS — I129 Hypertensive chronic kidney disease with stage 1 through stage 4 chronic kidney disease, or unspecified chronic kidney disease: Secondary | ICD-10-CM | POA: Diagnosis not present

## 2019-03-04 DIAGNOSIS — Z79899 Other long term (current) drug therapy: Secondary | ICD-10-CM | POA: Diagnosis not present

## 2019-03-04 DIAGNOSIS — Z885 Allergy status to narcotic agent status: Secondary | ICD-10-CM | POA: Diagnosis not present

## 2019-03-04 DIAGNOSIS — F329 Major depressive disorder, single episode, unspecified: Secondary | ICD-10-CM | POA: Insufficient documentation

## 2019-03-04 DIAGNOSIS — Z886 Allergy status to analgesic agent status: Secondary | ICD-10-CM | POA: Diagnosis not present

## 2019-03-04 DIAGNOSIS — Z791 Long term (current) use of non-steroidal anti-inflammatories (NSAID): Secondary | ICD-10-CM | POA: Diagnosis not present

## 2019-03-04 DIAGNOSIS — N183 Chronic kidney disease, stage 3 unspecified: Secondary | ICD-10-CM | POA: Diagnosis not present

## 2019-03-04 DIAGNOSIS — F17210 Nicotine dependence, cigarettes, uncomplicated: Secondary | ICD-10-CM | POA: Diagnosis not present

## 2019-03-04 DIAGNOSIS — I96 Gangrene, not elsewhere classified: Secondary | ICD-10-CM | POA: Insufficient documentation

## 2019-03-04 DIAGNOSIS — Z20822 Contact with and (suspected) exposure to covid-19: Secondary | ICD-10-CM | POA: Diagnosis not present

## 2019-03-04 DIAGNOSIS — Z8673 Personal history of transient ischemic attack (TIA), and cerebral infarction without residual deficits: Secondary | ICD-10-CM | POA: Insufficient documentation

## 2019-03-04 DIAGNOSIS — F419 Anxiety disorder, unspecified: Secondary | ICD-10-CM | POA: Diagnosis not present

## 2019-03-04 DIAGNOSIS — Z86718 Personal history of other venous thrombosis and embolism: Secondary | ICD-10-CM | POA: Diagnosis not present

## 2019-03-04 DIAGNOSIS — Z8249 Family history of ischemic heart disease and other diseases of the circulatory system: Secondary | ICD-10-CM | POA: Insufficient documentation

## 2019-03-04 DIAGNOSIS — K219 Gastro-esophageal reflux disease without esophagitis: Secondary | ICD-10-CM | POA: Insufficient documentation

## 2019-03-04 DIAGNOSIS — Z7952 Long term (current) use of systemic steroids: Secondary | ICD-10-CM | POA: Insufficient documentation

## 2019-03-04 DIAGNOSIS — I10 Essential (primary) hypertension: Secondary | ICD-10-CM | POA: Insufficient documentation

## 2019-03-04 DIAGNOSIS — Z880 Allergy status to penicillin: Secondary | ICD-10-CM | POA: Insufficient documentation

## 2019-03-04 DIAGNOSIS — Z86711 Personal history of pulmonary embolism: Secondary | ICD-10-CM | POA: Diagnosis not present

## 2019-03-04 DIAGNOSIS — E1159 Type 2 diabetes mellitus with other circulatory complications: Secondary | ICD-10-CM

## 2019-03-04 HISTORY — PX: DEBRIDEMENT AND CLOSURE WOUND: SHX5614

## 2019-03-04 LAB — RESPIRATORY PANEL BY RT PCR (FLU A&B, COVID)
Influenza A by PCR: NEGATIVE
Influenza B by PCR: NEGATIVE
SARS Coronavirus 2 by RT PCR: NEGATIVE

## 2019-03-04 LAB — GLUCOSE, CAPILLARY
Glucose-Capillary: 189 mg/dL — ABNORMAL HIGH (ref 70–99)
Glucose-Capillary: 158 mg/dL — ABNORMAL HIGH (ref 70–99)
Glucose-Capillary: 130 mg/dL — ABNORMAL HIGH (ref 70–99)

## 2019-03-04 SURGERY — DEBRIDEMENT, WOUND, WITH CLOSURE
Anesthesia: Monitor Anesthesia Care | Site: Finger | Laterality: Right

## 2019-03-04 MED ORDER — FENTANYL CITRATE (PF) 100 MCG/2ML IJ SOLN
INTRAMUSCULAR | Status: DC | PRN
  Administered 2019-03-04 (×2): 25 ug via INTRAVENOUS

## 2019-03-04 MED ORDER — MIDAZOLAM HCL 2 MG/2ML IJ SOLN
INTRAMUSCULAR | Status: AC
  Filled 2019-03-04: qty 2

## 2019-03-04 MED ORDER — ONDANSETRON HCL 4 MG/2ML IJ SOLN
INTRAMUSCULAR | Status: DC | PRN
  Administered 2019-03-04: 4 mg via INTRAVENOUS

## 2019-03-04 MED ORDER — LACTATED RINGERS IV SOLN: INTRAVENOUS | Status: DC | PRN

## 2019-03-04 MED ORDER — OXYCODONE HCL 5 MG PO TABS: 5.0000 mg | ORAL_TABLET | Freq: Once | ORAL | Status: DC | PRN

## 2019-03-04 MED ORDER — BUPIVACAINE HCL (PF) 0.25 % IJ SOLN
INTRAMUSCULAR | Status: AC
  Filled 2019-03-04: qty 30

## 2019-03-04 MED ORDER — OXYCODONE HCL 5 MG/5ML PO SOLN: 5.0000 mg | Freq: Once | ORAL | Status: DC | PRN

## 2019-03-04 MED ORDER — ACETAMINOPHEN 500 MG PO TABS: 1000.0000 mg | ORAL_TABLET | Freq: Once | ORAL | Status: DC

## 2019-03-04 MED ORDER — PROPOFOL 500 MG/50ML IV EMUL
INTRAVENOUS | Status: DC | PRN
  Administered 2019-03-04: 100 ug/kg/min via INTRAVENOUS

## 2019-03-04 MED ORDER — CHLORHEXIDINE GLUCONATE 4 % EX LIQD: 60.0000 mL | Freq: Once | CUTANEOUS | Status: DC

## 2019-03-04 MED ORDER — PROMETHAZINE HCL 25 MG/ML IJ SOLN: 6.2500 mg | INTRAMUSCULAR | Status: DC | PRN

## 2019-03-04 MED ORDER — LIDOCAINE HCL (PF) 1 % IJ SOLN
INTRAMUSCULAR | Status: DC | PRN
  Administered 2019-03-04: 10 mL

## 2019-03-04 MED ORDER — FENTANYL CITRATE (PF) 100 MCG/2ML IJ SOLN: 25.0000 ug | INTRAMUSCULAR | Status: DC | PRN

## 2019-03-04 MED ORDER — MIDAZOLAM HCL 5 MG/5ML IJ SOLN
INTRAMUSCULAR | Status: DC | PRN
  Administered 2019-03-04: 2 mg via INTRAVENOUS

## 2019-03-04 MED ORDER — FENTANYL CITRATE (PF) 250 MCG/5ML IJ SOLN
INTRAMUSCULAR | Status: AC
  Filled 2019-03-04: qty 5

## 2019-03-04 MED ORDER — PROPOFOL 10 MG/ML IV BOLUS
INTRAVENOUS | Status: DC | PRN
  Administered 2019-03-04 (×2): 10 mg via INTRAVENOUS
  Administered 2019-03-04: 20 mg via INTRAVENOUS

## 2019-03-04 MED ORDER — PHENYLEPHRINE HCL (PRESSORS) 10 MG/ML IV SOLN
INTRAVENOUS | Status: DC | PRN
  Administered 2019-03-04: 80 ug via INTRAVENOUS
  Administered 2019-03-04: 120 ug via INTRAVENOUS

## 2019-03-04 MED ORDER — LIDOCAINE HCL (PF) 1 % IJ SOLN
INTRAMUSCULAR | Status: AC
  Filled 2019-03-04: qty 30

## 2019-03-04 MED ORDER — CLINDAMYCIN PHOSPHATE 900 MG/50ML IV SOLN
900.0000 mg | INTRAVENOUS | Status: AC
  Administered 2019-03-04: 900 mg via INTRAVENOUS
  Filled 2019-03-04: qty 50

## 2019-03-04 MED ORDER — LACTATED RINGERS IV SOLN: INTRAVENOUS | Status: DC

## 2019-03-04 MED ORDER — 0.9 % SODIUM CHLORIDE (POUR BTL) OPTIME
TOPICAL | Status: DC | PRN
  Administered 2019-03-04: 1000 mL

## 2019-03-04 SURGICAL SUPPLY — 61 items
BNDG CMPR 9X4 STRL LF SNTH (GAUZE/BANDAGES/DRESSINGS) ×1
BNDG COHESIVE 1X5 TAN STRL LF (GAUZE/BANDAGES/DRESSINGS) ×2 IMPLANT
BNDG COHESIVE 3X5 TAN STRL LF (GAUZE/BANDAGES/DRESSINGS) ×2 IMPLANT
BNDG CONFORM 2 STRL LF (GAUZE/BANDAGES/DRESSINGS) ×2 IMPLANT
BNDG ELASTIC 3X5.8 VLCR STR LF (GAUZE/BANDAGES/DRESSINGS) ×3 IMPLANT
BNDG ELASTIC 4X5.8 VLCR STR LF (GAUZE/BANDAGES/DRESSINGS) ×3 IMPLANT
BNDG ESMARK 4X9 LF (GAUZE/BANDAGES/DRESSINGS) ×3 IMPLANT
BNDG GAUZE ELAST 4 BULKY (GAUZE/BANDAGES/DRESSINGS) ×3 IMPLANT
CORD BIPOLAR FORCEPS 12FT (ELECTRODE) ×3 IMPLANT
COVER SURGICAL LIGHT HANDLE (MISCELLANEOUS) ×3 IMPLANT
COVER WAND RF STERILE (DRAPES) ×1 IMPLANT
CUFF TOURN SGL QUICK 18X4 (TOURNIQUET CUFF) ×3 IMPLANT
CUFF TOURN SGL QUICK 24 (TOURNIQUET CUFF)
CUFF TRNQT CYL 24X4X16.5-23 (TOURNIQUET CUFF) IMPLANT
DRAIN PENROSE 1/4X12 LTX STRL (WOUND CARE) IMPLANT
DRAPE SURG 17X23 STRL (DRAPES) ×3 IMPLANT
DRSG ADAPTIC 3X8 NADH LF (GAUZE/BANDAGES/DRESSINGS) ×3 IMPLANT
ELECT REM PT RETURN 9FT ADLT (ELECTROSURGICAL)
ELECTRODE REM PT RTRN 9FT ADLT (ELECTROSURGICAL) IMPLANT
GAUZE SPONGE 2X2 8PLY STRL LF (GAUZE/BANDAGES/DRESSINGS) IMPLANT
GAUZE SPONGE 4X4 12PLY STRL (GAUZE/BANDAGES/DRESSINGS) ×3 IMPLANT
GAUZE XEROFORM 1X8 LF (GAUZE/BANDAGES/DRESSINGS) ×3 IMPLANT
GAUZE XEROFORM 5X9 LF (GAUZE/BANDAGES/DRESSINGS) IMPLANT
GLOVE BIOGEL PI IND STRL 8.5 (GLOVE) ×1 IMPLANT
GLOVE BIOGEL PI INDICATOR 8.5 (GLOVE) ×2
GLOVE SURG ORTHO 8.0 STRL STRW (GLOVE) ×3 IMPLANT
GOWN STRL REUS W/ TWL LRG LVL3 (GOWN DISPOSABLE) ×3 IMPLANT
GOWN STRL REUS W/ TWL XL LVL3 (GOWN DISPOSABLE) ×1 IMPLANT
GOWN STRL REUS W/TWL LRG LVL3 (GOWN DISPOSABLE) ×9
GOWN STRL REUS W/TWL XL LVL3 (GOWN DISPOSABLE) ×3
HANDPIECE INTERPULSE COAX TIP (DISPOSABLE)
KIT BASIN OR (CUSTOM PROCEDURE TRAY) ×3 IMPLANT
KIT TURNOVER KIT B (KITS) ×3 IMPLANT
MANIFOLD NEPTUNE II (INSTRUMENTS) ×3 IMPLANT
NDL HYPO 25GX1X1/2 BEV (NEEDLE) IMPLANT
NEEDLE HYPO 25GX1X1/2 BEV (NEEDLE) IMPLANT
NS IRRIG 1000ML POUR BTL (IV SOLUTION) ×3 IMPLANT
PACK ORTHO EXTREMITY (CUSTOM PROCEDURE TRAY) ×3 IMPLANT
PAD ARMBOARD 7.5X6 YLW CONV (MISCELLANEOUS) ×6 IMPLANT
PAD CAST 4YDX4 CTTN HI CHSV (CAST SUPPLIES) ×1 IMPLANT
PADDING CAST COTTON 4X4 STRL (CAST SUPPLIES) ×3
SET CYSTO W/LG BORE CLAMP LF (SET/KITS/TRAYS/PACK) IMPLANT
SET HNDPC FAN SPRY TIP SCT (DISPOSABLE) IMPLANT
SOAP 2 % CHG 4 OZ (WOUND CARE) ×3 IMPLANT
SPONGE GAUZE 2X2 STER 10/PKG (GAUZE/BANDAGES/DRESSINGS) ×2
SPONGE LAP 18X18 RF (DISPOSABLE) ×3 IMPLANT
SPONGE LAP 4X18 RFD (DISPOSABLE) ×3 IMPLANT
SUT ETHILON 4 0 PS 2 18 (SUTURE) IMPLANT
SUT ETHILON 5 0 P 3 18 (SUTURE)
SUT NYLON ETHILON 5-0 P-3 1X18 (SUTURE) IMPLANT
SUT PROLENE 5 0 P 3 (SUTURE) ×2 IMPLANT
SWAB COLLECTION DEVICE MRSA (MISCELLANEOUS) ×3 IMPLANT
SWAB CULTURE ESWAB REG 1ML (MISCELLANEOUS) IMPLANT
SYR CONTROL 10ML LL (SYRINGE) IMPLANT
TOWEL GREEN STERILE (TOWEL DISPOSABLE) ×3 IMPLANT
TOWEL GREEN STERILE FF (TOWEL DISPOSABLE) ×3 IMPLANT
TUBE CONNECTING 12'X1/4 (SUCTIONS) ×1
TUBE CONNECTING 12X1/4 (SUCTIONS) ×2 IMPLANT
UNDERPAD 30X30 (UNDERPADS AND DIAPERS) ×3 IMPLANT
WATER STERILE IRR 1000ML POUR (IV SOLUTION) ×3 IMPLANT
YANKAUER SUCT BULB TIP NO VENT (SUCTIONS) ×3 IMPLANT

## 2019-03-04 NOTE — H&P (Signed)
[name] is an 54 y.o. female.   Chief Complaint: RIGHT RING FINGER PAIN  HPI: The patient is a 54 y/o right hand dominant female with diabetes and tobacco use who started to have swelling and pain of the right ring finger without any injury. She was seen by her primary care provider and was given antibiotics. The pain, erythema, and swelling worsened and she was referred to our office.  On 02/20/19, she underwent an incision and drainage in the office. She had whirlpool and wound care in therapy but the pain and swelling persisted. She was compliant with taking the antibiotics. Discussed the reason and rationale for surgical intervention.  She is here today for surgery.  She denies chest pain, shortness of breath, fever, chills, nausea, vomiting, or diarrhea.   Past Medical History:  Diagnosis Date  . Acid reflux   . Adrenal benign tumor    Apparently enlarging and patient "is supposed to have it taken out"  . Anxiety   . Depression   . Diabetes mellitus without complication ([place])   . DVT (deep vein thrombosis) in pregnancy   . Hypertension   . PE (pulmonary embolism)   . Stroke [facility])     Past Surgical History:  Procedure Laterality Date  . LOOP RECORDER IMPLANT N/A 03/25/2014   Procedure: LOOP RECORDER IMPLANT;  Surgeon: [name], MD;  Location: [facility] CATH LAB;  Service: Cardiovascular;  Laterality: N/A;  . NO PAST SURGERIES    . TEE WITHOUT CARDIOVERSION N/A 03/25/2014   Procedure: TRANSESOPHAGEAL ECHOCARDIOGRAM (TEE);  Surgeon: [name], MD;  Location: [facility] ENDOSCOPY;  Service: Cardiovascular;  Laterality: N/A;    Family History  Problem Relation Age of Onset  . Heart attack Father 57   Social History:  reports that she has been smoking cigarettes. She has a 40.00 pack-year smoking history. She has never used smokeless tobacco. She reports that she does not drink alcohol or use drugs.  Allergies:  Allergies  Allergen Reactions  . Amoxicillin Hives  .  Aspirin Hives  . Codeine Hives  . Penicillins Hives    Has patient had a PCN reaction causing immediate rash, facial/tongue/throat swelling, SOB or lightheadedness with hypotension: Yes Has patient had a PCN reaction causing severe rash involving mucus membranes or skin necrosis: No Has patient had a PCN reaction that required hospitalization No Has patient had a PCN reaction occurring within the last 10 years: Yes If all of the above answers are "NO", then may proceed with Cephalosporin use.     Medications Prior to Admission  Medication Sig Dispense Refill  . acetaminophen (TYLENOL) 500 MG tablet Take 1,000 mg by mouth every 6 (six) hours as needed for moderate pain.    Marland Kitchen clopidogrel (PLAVIX) 75 MG tablet Take 1 tablet (75 mg total) by mouth daily. 30 tablet 2  . diphenhydramine-acetaminophen (TYLENOL PM) 25-500 MG TABS tablet Take 2 tablets by mouth at bedtime as needed (sleep).    Marland Kitchen FARXIGA 10 MG TABS tablet Take 10 mg by mouth daily.    . fenofibrate 160 MG tablet Take 1 tablet (160 mg total) by mouth daily. 30 tablet 2  . lisinopril (ZESTRIL) 20 MG tablet Take 1 tablet (20 mg total) by mouth daily. 30 tablet 0  . meloxicam (MOBIC) 7.5 MG tablet Take 1 tablet (7.5 mg total) by mouth daily. 14 tablet 0  . metFORMIN (GLUCOPHAGE-XR) 500 MG 24 hr tablet Take 1 tablet (500 mg total) by mouth daily with breakfast.  30 tablet 0  . methocarbamol (ROBAXIN) 500 MG tablet Take 1 tablet (500 mg total) by mouth every 8 (eight) hours as needed for muscle spasms. 20 tablet 0  . ondansetron (ZOFRAN) 4 MG tablet Take 1 tablet (4 mg total) by mouth every 6 (six) hours as needed for nausea or vomiting. 12 tablet 0  . PARoxetine (PAXIL) 40 MG tablet Take 40 mg by mouth every morning.    . pravastatin (PRAVACHOL) 10 MG tablet Take 10 mg by mouth daily.   1  . predniSONE (DELTASONE) 20 MG tablet 3 tabs po daily x 3 days, then 2 tabs x 3 days, then 1.5 tabs x 3 days, then 1 tab x 3 days, then 0.5 tabs x 3 days  27 tablet 0    Results for orders placed or performed during the hospital encounter of 03/04/19 (from the past 48 hour(s))  Glucose, capillary     Status: Abnormal   Collection Time: 03/04/19 12:11 PM  Result Value Ref Range   Glucose-Capillary 189 (H) 70 - 99 mg/dL    Comment: Glucose reference range applies only to samples taken after fasting for at least 8 hours.   No results found.  ROS NO RECENT ILLNESSES OR HOSPITALIZATIONS  Blood pressure (!) 85/48, pulse (!) 109, temperature 98.6 F (37 C), temperature source Oral, resp. rate 18, height 5\' 6"  (1.676 m), weight 72.6 kg, SpO2 96 %. Physical Exam  General Appearance:  Alert, cooperative, no distress, appears stated age  Head:  Normocephalic, without obvious abnormality, atraumatic  Eyes:  Pupils equal, conjunctiva/corneas clear,         Throat: Lips, mucosa, and tongue normal; teeth and gums normal  Neck: No visible masses     Lungs:   respirations unlabored  Chest Wall:  No tenderness or deformity  Heart:  Regular rate and rhythm,  Abdomen:   Soft, non-tender,         Extremities: RUE - Erythema of the ring finger without purulent drainage. Moderate swelling with significant tenderness to palpation. Limited flexion of the finger but able to extend.   Pulses: 2+ and symmetric  Skin: Skin color, texture, turgor normal, no rashes or lesions     Neurologic: Normal    Assessment RIGHT RING FINGER INFECTION   Plan RIGHT RING FINGER WOUND DEBRIDEMENT WITH POSSIBLE AMPUTATION THROUGH THE DISTAL INTERPHALANGEAL JOINT  R/B/A DISCUSSED WITH PT IN OFFICE.  PT VOICED UNDERSTANDING OF PLAN CONSENT SIGNED DAY OF SURGERY PT SEEN AND EXAMINED PRIOR TO OPERATIVE PROCEDURE/DAY OF SURGERY SITE MARKED. QUESTIONS ANSWERED WILL GO HOME FOLLOWING SURGERY   WE ARE PLANNING SURGERY FOR YOUR UPPER EXTREMITY. THE RISKS AND BENEFITS OF SURGERY INCLUDE BUT NOT LIMITED TO BLEEDING INFECTION, DAMAGE TO NEARBY NERVES ARTERIES TENDONS,  FAILURE OF SURGERY TO ACCOMPLISH ITS INTENDED GOALS, PERSISTENT SYMPTOMS AND NEED FOR FURTHER SURGICAL INTERVENTION. WITH THIS IN MIND WE WILL PROCEED. I HAVE DISCUSSED WITH THE PATIENT THE PRE AND POSTOPERATIVE REGIMEN AND THE DOS AND DON'TS. PT VOICED UNDERSTANDING AND INFORMED CONSENT SIGNED.  Astin Rape [facility] MD 03/04/19  Brynda Peon 03/04/2019, 12:23 PM

## 2019-03-04 NOTE — Discharge Instructions (Signed)
KEEP BANDAGE CLEAN AND DRY CALL OFFICE FOR F/U APPT [phone] in 2 days with Carron Brazen PAC KEEP HAND ELEVATED ABOVE HEART OK TO APPLY ICE TO OPERATIVE AREA CONTACT OFFICE IF ANY WORSENING PAIN OR CONCERNS.

## 2019-03-04 NOTE — Transfer of Care (Signed)
Immediate Anesthesia Transfer of Care Note  Patient: [name]  Procedure(s) Performed: 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 (Right Finger)  Patient Location: PACU  Anesthesia Type:MAC  Level of Consciousness: awake, alert  and oriented  Airway & Oxygen Therapy: Patient Spontanous Breathing and Patient connected to face mask oxygen  Post-op Assessment: Report given to RN and Post -op Vital signs reviewed and stable  Post vital signs: Reviewed and stable  Last Vitals:  Vitals Value Taken Time  BP    Temp    Pulse 101 03/04/19 1648  Resp 16 03/04/19 1648  SpO2 96 % 03/04/19 1648  Vitals shown include unvalidated device data.  Last Pain:  Vitals:   03/04/19 1244  TempSrc:   PainSc: 10-Worst pain ever      Patients Stated Pain Goal: 4 (31/54/00 8676)  Complications: No apparent anesthesia complications

## 2019-03-04 NOTE — Anesthesia Procedure Notes (Signed)
Procedure Name: MAC Date/Time: 03/04/2019 4:00 PM Performed by: Inda Coke, CRNA Pre-anesthesia Checklist: Patient identified, Emergency Drugs available, Suction available, Timeout performed and Patient being monitored Patient Re-evaluated:Patient Re-evaluated prior to induction Oxygen Delivery Method: Simple face mask Induction Type: IV induction Dental Injury: Teeth and Oropharynx as per pre-operative assessment

## 2019-03-04 NOTE — Anesthesia Preprocedure Evaluation (Addendum)
Anesthesia Evaluation  Patient identified by MRN, date of birth, ID band Patient awake    Reviewed: Allergy & Precautions, H&P , NPO status , Patient's Chart, lab work & pertinent test results  History of Anesthesia Complications Negative for: history of anesthetic complications  Airway Mallampati: II  TM Distance: >3 FB Neck ROM: Full    Dental no notable dental hx. (+) Teeth Intact, Dental Advisory Given   Pulmonary Current Smoker, PE   Pulmonary exam normal breath sounds clear to auscultation       Cardiovascular hypertension, + DVT   Rhythm:Regular Rate:Normal     Neuro/Psych PSYCHIATRIC DISORDERS Anxiety Depression TIACVA, No Residual Symptoms    GI/Hepatic Neg liver ROS, GERD  ,  Endo/Other  diabetes, Type 2, Oral Hypoglycemic Agents Benign adrenal tumor   Renal/GU CRFRenal disease  negative genitourinary   Musculoskeletal negative musculoskeletal ROS (+)   Abdominal   Peds  Hematology negative hematology ROS (+)  On plavix    Anesthesia Other Findings   Reproductive/Obstetrics negative OB ROS                            Anesthesia Physical Anesthesia Plan  ASA: III  Anesthesia Plan: MAC   Post-op Pain Management:    Induction: Intravenous  PONV Risk Score and Plan: 2 and Propofol infusion, Treatment may vary due to age or medical condition, Ondansetron and Midazolam  Airway Management Planned: Natural Airway and Simple Face Mask  Additional Equipment: None  Intra-op Plan:   Post-operative Plan:   Informed Consent: I have reviewed the patients History and Physical, chart, labs and discussed the procedure including the risks, benefits and alternatives for the proposed anesthesia with the patient or authorized representative who has indicated his/her understanding and acceptance.     Dental advisory given  Plan Discussed with: CRNA and  Anesthesiologist  Anesthesia Plan Comments: (MAC with digital block by surgeon vs. LMA )       Anesthesia Quick Evaluation

## 2019-03-04 NOTE — Anesthesia Postprocedure Evaluation (Signed)
Anesthesia Post Note  Patient: [name]  Procedure(s) Performed: 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 (Right Finger)     Patient location during evaluation: PACU Anesthesia Type: MAC Level of consciousness: awake and alert Pain management: pain level controlled Vital Signs Assessment: post-procedure vital signs reviewed and stable Respiratory status: spontaneous breathing, nonlabored ventilation, respiratory function stable and patient connected to nasal cannula oxygen Cardiovascular status: blood pressure returned to baseline and stable Postop Assessment: no apparent nausea or vomiting Anesthetic complications: no    Last Vitals:  Vitals:   03/04/19 1704 03/04/19 1715  BP: 95/75 108/76  Pulse:  85  Resp:  20  Temp:    SpO2:  95%    Last Pain:  Vitals:   03/04/19 1715  TempSrc:   PainSc: 0-No pain                 Henry Utsey DAVID

## 2019-03-04 NOTE — Op Note (Signed)
PREOPERATIVE DIAGNOSIS: Right ring finger gangrene dry gangrene  POSTOPERATIVE DIAGNOSIS: Same  ATTENDING SURGEON: Dr. Iran Planas who scrubbed and present for the entire procedure  ASSISTANT SURGEON: None  ANESTHESIA: 1% Xylocaine with IV sedation  OPERATIVE PROCEDURE: #1: Right ring finger amputation with local neurectomies and advancement flap closure #2: Debridement of skin subcutaneous tissue right ring finger excisional debridement  IMPLANTS: None  RADIOGRAPHIC INTERPRETATION: None  SURGICAL INDICATIONS: Patient is a right-hand-dominant female with a worsening infection and distal gangrene.  Patient seen evaluate the office and recommended undergo the above procedure.  The risks of surgery include but not limited to bleeding infection damage nearby nerves arteries or tendons loss of motion of the wrist and digits incomplete relief of symptoms and need for further surgical invention  SURGICAL TECHNIQUE: Patient palpated find the preoperative holding area marked for marker made on the right ring finger indicate correct operative site.  Patient brought back operating placed supine on anesthesia table where the IV sedation was administered.  1% Xylocaine was then administered.  Patient tolerated the digital block.  Well-padded tourniquet placed in the right forearm and seal with the appropriate drape.  Right upper extremities then prepped and draped normal sterile fashion.  A timeout was called the correct site identified procedure then begun.  Attention then turned to the right ring finger excisional debridement of the skin subcutaneous tissue was done circumferentially removing the devitalized tissue.  This created a large soft tissue void with a large exposure of the distal phalanx.  Amputation was then carried out after excisional debridement of the 2 x 2 centimeter area that was devitalized.  The tissue did not look healthy patient still have residual infection in the distal pulp of the  finger.  The FDP was then resected as well as extensor mechanism allowing for resection of the bone at the level of the distal interphalangeal joint.  Local neurectomies were then carried out.  Based on the soft tissue defect volar advancement flap was then carried out to allow for closure.  Advancement flap closure was then done wound was irrigated and closed with simple Prolene sutures.  Adaptic dressing sterile compressive bandage applied.  Patient tolerated procedure well.  POSTOPERATIVE PLAN: Patient be discharged home.  See him back in the office for wound check in 2 days.  Symptoms see her therapist for small protective splint sutures out around the 3-week mark.  We will see her each week for wound check given her poor vascular status and underlying diabetes.  Continue on oral antibiotics.

## 2019-03-09 DIAGNOSIS — M47812 Spondylosis without myelopathy or radiculopathy, cervical region: Secondary | ICD-10-CM | POA: Diagnosis not present

## 2019-03-09 DIAGNOSIS — M25541 Pain in joints of right hand: Secondary | ICD-10-CM | POA: Diagnosis not present

## 2019-03-09 DIAGNOSIS — Z79899 Other long term (current) drug therapy: Secondary | ICD-10-CM | POA: Diagnosis not present

## 2019-03-09 DIAGNOSIS — F17210 Nicotine dependence, cigarettes, uncomplicated: Secondary | ICD-10-CM | POA: Diagnosis not present

## 2019-03-09 DIAGNOSIS — M19011 Primary osteoarthritis, right shoulder: Secondary | ICD-10-CM | POA: Diagnosis not present

## 2019-03-09 DIAGNOSIS — M19012 Primary osteoarthritis, left shoulder: Secondary | ICD-10-CM | POA: Diagnosis not present

## 2019-03-13 DIAGNOSIS — M79644 Pain in right finger(s): Secondary | ICD-10-CM | POA: Diagnosis not present

## 2019-03-24 DIAGNOSIS — R110 Nausea: Secondary | ICD-10-CM | POA: Diagnosis not present

## 2019-03-24 DIAGNOSIS — R509 Fever, unspecified: Secondary | ICD-10-CM | POA: Diagnosis not present

## 2019-03-24 DIAGNOSIS — F418 Other specified anxiety disorders: Secondary | ICD-10-CM | POA: Diagnosis not present

## 2019-03-24 DIAGNOSIS — E1122 Type 2 diabetes mellitus with diabetic chronic kidney disease: Secondary | ICD-10-CM | POA: Diagnosis not present

## 2019-03-25 DIAGNOSIS — R110 Nausea: Secondary | ICD-10-CM | POA: Diagnosis not present

## 2019-03-25 DIAGNOSIS — Z03818 Encounter for observation for suspected exposure to other biological agents ruled out: Secondary | ICD-10-CM | POA: Diagnosis not present

## 2019-03-28 DIAGNOSIS — Z79899 Other long term (current) drug therapy: Secondary | ICD-10-CM | POA: Diagnosis not present

## 2019-03-28 DIAGNOSIS — M19011 Primary osteoarthritis, right shoulder: Secondary | ICD-10-CM | POA: Diagnosis not present

## 2019-03-28 DIAGNOSIS — R209 Unspecified disturbances of skin sensation: Secondary | ICD-10-CM | POA: Diagnosis not present

## 2019-03-28 DIAGNOSIS — F17210 Nicotine dependence, cigarettes, uncomplicated: Secondary | ICD-10-CM | POA: Diagnosis not present

## 2019-03-28 DIAGNOSIS — M25541 Pain in joints of right hand: Secondary | ICD-10-CM | POA: Diagnosis not present

## 2019-03-28 DIAGNOSIS — M62838 Other muscle spasm: Secondary | ICD-10-CM | POA: Diagnosis not present

## 2019-04-30 DIAGNOSIS — Z79899 Other long term (current) drug therapy: Secondary | ICD-10-CM | POA: Diagnosis not present

## 2019-04-30 DIAGNOSIS — R209 Unspecified disturbances of skin sensation: Secondary | ICD-10-CM | POA: Diagnosis not present

## 2019-04-30 DIAGNOSIS — F17210 Nicotine dependence, cigarettes, uncomplicated: Secondary | ICD-10-CM | POA: Diagnosis not present

## 2019-04-30 DIAGNOSIS — M19011 Primary osteoarthritis, right shoulder: Secondary | ICD-10-CM | POA: Diagnosis not present

## 2019-04-30 DIAGNOSIS — M25541 Pain in joints of right hand: Secondary | ICD-10-CM | POA: Diagnosis not present

## 2019-04-30 DIAGNOSIS — M62838 Other muscle spasm: Secondary | ICD-10-CM | POA: Diagnosis not present

## 2019-05-18 ENCOUNTER — Other Ambulatory Visit: Payer: Self-pay | Admitting: Internal Medicine

## 2019-05-18 DIAGNOSIS — Z1231 Encounter for screening mammogram for malignant neoplasm of breast: Secondary | ICD-10-CM

## 2019-06-02 DIAGNOSIS — M25541 Pain in joints of right hand: Secondary | ICD-10-CM | POA: Diagnosis not present

## 2019-06-02 DIAGNOSIS — M62838 Other muscle spasm: Secondary | ICD-10-CM | POA: Diagnosis not present

## 2019-06-02 DIAGNOSIS — F17210 Nicotine dependence, cigarettes, uncomplicated: Secondary | ICD-10-CM | POA: Diagnosis not present

## 2019-06-02 DIAGNOSIS — R209 Unspecified disturbances of skin sensation: Secondary | ICD-10-CM | POA: Diagnosis not present

## 2019-06-02 DIAGNOSIS — M19011 Primary osteoarthritis, right shoulder: Secondary | ICD-10-CM | POA: Diagnosis not present

## 2019-06-02 DIAGNOSIS — Z79899 Other long term (current) drug therapy: Secondary | ICD-10-CM | POA: Diagnosis not present

## 2019-07-02 DIAGNOSIS — M62838 Other muscle spasm: Secondary | ICD-10-CM | POA: Diagnosis not present

## 2019-07-02 DIAGNOSIS — F17210 Nicotine dependence, cigarettes, uncomplicated: Secondary | ICD-10-CM | POA: Diagnosis not present

## 2019-07-02 DIAGNOSIS — Z79899 Other long term (current) drug therapy: Secondary | ICD-10-CM | POA: Diagnosis not present

## 2019-07-02 DIAGNOSIS — M79641 Pain in right hand: Secondary | ICD-10-CM | POA: Diagnosis not present

## 2019-07-02 DIAGNOSIS — M79642 Pain in left hand: Secondary | ICD-10-CM | POA: Diagnosis not present

## 2019-07-24 ENCOUNTER — Emergency Department (HOSPITAL_BASED_OUTPATIENT_CLINIC_OR_DEPARTMENT_OTHER): Payer: BC Managed Care – PPO

## 2019-07-24 ENCOUNTER — Other Ambulatory Visit: Payer: Self-pay

## 2019-07-24 ENCOUNTER — Emergency Department (HOSPITAL_BASED_OUTPATIENT_CLINIC_OR_DEPARTMENT_OTHER)
Admission: EM | Admit: 2019-07-24 | Discharge: 2019-07-24 | Disposition: A | Discharge status: Home / Self Care | Payer: BC Managed Care – PPO | Attending: Emergency Medicine | Admitting: Emergency Medicine

## 2019-07-24 ENCOUNTER — Encounter (HOSPITAL_BASED_OUTPATIENT_CLINIC_OR_DEPARTMENT_OTHER): Payer: Self-pay | Admitting: *Deleted

## 2019-07-24 DIAGNOSIS — J069 Acute upper respiratory infection, unspecified: Secondary | ICD-10-CM | POA: Diagnosis not present

## 2019-07-24 DIAGNOSIS — F17210 Nicotine dependence, cigarettes, uncomplicated: Secondary | ICD-10-CM | POA: Insufficient documentation

## 2019-07-24 DIAGNOSIS — Z20822 Contact with and (suspected) exposure to covid-19: Secondary | ICD-10-CM | POA: Diagnosis not present

## 2019-07-24 DIAGNOSIS — E1122 Type 2 diabetes mellitus with diabetic chronic kidney disease: Secondary | ICD-10-CM | POA: Insufficient documentation

## 2019-07-24 DIAGNOSIS — N183 Chronic kidney disease, stage 3 unspecified: Secondary | ICD-10-CM | POA: Diagnosis not present

## 2019-07-24 DIAGNOSIS — Z7901 Long term (current) use of anticoagulants: Secondary | ICD-10-CM | POA: Insufficient documentation

## 2019-07-24 DIAGNOSIS — I129 Hypertensive chronic kidney disease with stage 1 through stage 4 chronic kidney disease, or unspecified chronic kidney disease: Secondary | ICD-10-CM | POA: Insufficient documentation

## 2019-07-24 DIAGNOSIS — R05 Cough: Secondary | ICD-10-CM | POA: Diagnosis not present

## 2019-07-24 DIAGNOSIS — Z7984 Long term (current) use of oral hypoglycemic drugs: Secondary | ICD-10-CM | POA: Diagnosis not present

## 2019-07-24 DIAGNOSIS — B9789 Other viral agents as the cause of diseases classified elsewhere: Secondary | ICD-10-CM | POA: Diagnosis not present

## 2019-07-24 DIAGNOSIS — R42 Dizziness and giddiness: Secondary | ICD-10-CM | POA: Diagnosis not present

## 2019-07-24 LAB — SARS CORONAVIRUS 2 BY RT PCR (HOSPITAL ORDER, PERFORMED IN ~~LOC~~ HOSPITAL LAB): SARS Coronavirus 2: NEGATIVE

## 2019-07-24 MED ORDER — BENZONATATE 100 MG PO CAPS: 100.0000 mg | ORAL_CAPSULE | Freq: Three times a day (TID) | ORAL | 0 refills | Status: DC

## 2019-07-24 NOTE — ED Triage Notes (Signed)
Cough, SOB , body aches chills, x 2 days

## 2019-07-24 NOTE — Discharge Instructions (Signed)
Take tylenol 2 pills 4 times a day and motrin 4 pills 3 times a day.  Drink plenty of fluids.  Return for worsening shortness of breath, headache, confusion. Follow up with your family doctor.   

## 2019-07-24 NOTE — ED Provider Notes (Addendum)
[facility] EMERGENCY DEPARTMENT Provider Note   CSN: [id number] Arrival date & time: 07/24/19  8280     History Chief Complaint  Patient presents with  . Cough    [name] is a 54 y.o. female.  54 yo F with a cc of cough. Going on for a couple days.  No fevers.  Mild sob.  No known sick contacts, no travel.  No abdominal pain, n/v.  Diarrhea but thinks she overtook her laxatives.    The history is provided by the patient.  Cough Associated symptoms: shortness of breath   Associated symptoms: no chest pain, no chills, no fever, no headaches, no myalgias, no rhinorrhea and no wheezing   Illness Severity:  Moderate Onset quality:  Gradual Duration:  2 days Timing:  Constant Progression:  Worsening Chronicity:  New Associated symptoms: cough and shortness of breath   Associated symptoms: no chest pain, no congestion, no fever, no headaches, no myalgias, no nausea, no rhinorrhea, no vomiting and no wheezing        Past Medical History:  Diagnosis Date  . Acid reflux   . Adrenal benign tumor    Apparently enlarging and patient "is supposed to have it taken out"  . Anxiety   . Depression   . Diabetes mellitus without complication ([place])   . DVT (deep vein thrombosis) in pregnancy   . Hypertension   . PE (pulmonary embolism)   . Stroke [facility])     Patient Active Problem List   Diagnosis Date Noted  . CKD (chronic kidney disease), stage III 05/17/2016  . Type 2 diabetes mellitus with vascular disease ([place]) 05/17/2016  . Essential hypertension 05/17/2016  . DVT (deep venous thrombosis) ([place]) 05/16/2016  . Chronic ischemic left PCA stroke 07/30/2013  . TIA (transient ischemic attack) 07/30/2013  . Posterior circulation stroke of uncertain pathology ([place]) 07/30/2013    Past Surgical History:  Procedure Laterality Date  . DEBRIDEMENT AND CLOSURE WOUND Right 03/04/2019   Procedure: DEBRIDEMENT of distal phalanx with amputation through the distal  interphalangeal joint;  Surgeon: Iran Planas, MD;  Location: [facility];  Service: Orthopedics;  Laterality: Right;  needs 60 min  . LOOP RECORDER IMPLANT N/A 03/25/2014   Procedure: LOOP RECORDER IMPLANT;  Surgeon: [name], MD;  Location: [facility] CATH LAB;  Service: Cardiovascular;  Laterality: N/A;  . NO PAST SURGERIES    . TEE WITHOUT CARDIOVERSION N/A 03/25/2014   Procedure: TRANSESOPHAGEAL ECHOCARDIOGRAM (TEE);  Surgeon: [name], MD;  Location: [facility] ENDOSCOPY;  Service: Cardiovascular;  Laterality: N/A;     OB History    Gravida  2   Para  2   Term  2   Preterm  0   AB  0   Living  2     SAB  0   TAB  0   Ectopic  0   Multiple  0   Live Births              Family History  Problem Relation Age of Onset  . Heart attack Father 54    Social History   Tobacco Use  . Smoking status: Current Every Day Smoker    Packs/day: 1.50    Years: 40.00    Pack years: 60.00    Types: Cigarettes  . Smokeless tobacco: Never Used  Vaping Use  . Vaping Use: Never used  Substance Use Topics  . Alcohol use: No    Alcohol/week: 0.0 standard drinks  .  Drug use: No    Home Medications Prior to Admission medications   Medication Sig Start Date End Date Taking? Authorizing Provider  ALPRAZolam [name]) 1 MG tablet Take 1 mg by mouth at bedtime. 02/20/19   [provider]  apixaban (ELIQUIS) 5 MG TABS tablet Take 5 mg by mouth 2 (two) times daily.    [provider]  benzonatate (TESSALON) 100 MG capsule Take 1 capsule (100 mg total) by mouth every 8 (eight) hours. 07/24/19   Deno Etienne, DO  clopidogrel (PLAVIX) 75 MG tablet Take 1 tablet (75 mg total) by mouth daily. 09/07/18   Fatima Blank, MD  diphenhydramine-acetaminophen (TYLENOL PM) 25-500 MG TABS tablet Take 2 tablets by mouth at bedtime as needed (sleep).    [provider]  doxycycline (VIBRAMYCIN) 100 MG capsule Take 100 mg by mouth 2 (two) times daily. 02/18/19   [provider]  FARXIGA 10 MG TABS tablet Take 10 mg by mouth daily. 03/14/16   [provider]  fenofibrate 160 MG tablet Take 1 tablet (160 mg total) by mouth daily. Patient not taking: Reported on 03/04/2019 07/31/13   [name], MD  fenofibrate 54 MG tablet Take 54 mg by mouth daily. 01/26/19   [provider]  glimepiride (AMARYL) 2 MG tablet Take 2 mg by mouth every morning. 01/26/19   [provider]  HYDROcodone-acetaminophen (NORCO) 10-325 MG tablet Take 1 tablet by mouth every 6 (six) hours.    [provider]  lisinopril (ZESTRIL) 20 MG tablet Take 1 tablet (20 mg total) by mouth daily. Patient not taking: Reported on 03/04/2019 09/07/18 10/07/18  Fatima Blank, MD  meloxicam (MOBIC) 7.5 MG tablet Take 1 tablet (7.5 mg total) by mouth daily. Patient not taking: Reported on 03/04/2019 01/17/19   Mesner, Corene Cornea, MD  metFORMIN (GLUCOPHAGE-XR) 500 MG 24 hr tablet Take 1 tablet (500 mg total) by mouth daily with breakfast. 09/07/18   Cardama, Grayce Sessions, MD  methocarbamol (ROBAXIN) 500 MG tablet Take 1 tablet (500 mg total) by mouth every 8 (eight) hours as needed for muscle spasms. 01/17/19   Mesner, Corene Cornea, MD  ondansetron (ZOFRAN) 4 MG tablet Take 1 tablet (4 mg total) by mouth every 6 (six) hours as needed for nausea or vomiting. 09/07/18   Cardama, Grayce Sessions, MD  PARoxetine (PAXIL) 40 MG tablet Take 40 mg by mouth every morning.    [provider]  pravastatin (PRAVACHOL) 20 MG tablet Take 20 mg by mouth daily.  03/08/14   [provider]  predniSONE (DELTASONE) 20 MG tablet 3 tabs po daily x 3 days, then 2 tabs x 3 days, then 1.5 tabs x 3 days, then 1 tab x 3 days, then 0.5 tabs x 3 days Patient not taking: Reported on 03/04/2019 01/17/19   Mesner, Corene Cornea, MD  TRULICITY 1.61 WR/6.0AV SOPN Inject 0.75 mg into the skin every Monday. 01/26/19   [provider]    Allergies    Amoxicillin, Aspirin, Codeine, and  Penicillins  Review of Systems   Review of Systems  Constitutional: Negative for chills and fever.  HENT: Negative for congestion and rhinorrhea.   Eyes: Negative for redness and visual disturbance.  Respiratory: Positive for cough and shortness of breath. Negative for wheezing.   Cardiovascular: Negative for chest pain and palpitations.  Gastrointestinal: Negative for nausea and vomiting.  Genitourinary: Negative for dysuria and urgency.  Musculoskeletal: Negative for arthralgias and myalgias.  Skin: Negative for pallor and wound.  Neurological:  Negative for dizziness and headaches.    Physical Exam Updated Vital Signs BP 121/82 (BP Location: Left Arm)   Pulse (!) 59   Temp 98.9 F (37.2 C) (Oral)   Resp 18   Ht 5\' 6"  (1.676 m)   Wt 74.8 kg   LMP  (LMP Unknown) Comment: "a few months"  SpO2 100% Comment: ear  BMI 26.63 kg/m   Physical Exam Vitals and nursing note reviewed.  Constitutional:      General: She is not in acute distress.    Appearance: She is well-developed. She is not diaphoretic.  HENT:     Head: Normocephalic and atraumatic.     Comments: Swollen turbinates, posterior nasal drip, no noted sinus ttp, tm normal bilaterally.   Eyes:     Pupils: Pupils are equal, round, and reactive to light.  Cardiovascular:     Rate and Rhythm: Normal rate and regular rhythm.     Heart sounds: No murmur heard.  No friction rub. No gallop.   Pulmonary:     Effort: Pulmonary effort is normal.     Breath sounds: No wheezing or rales.  Abdominal:     General: There is no distension.     Palpations: Abdomen is soft.     Tenderness: There is no abdominal tenderness.  Musculoskeletal:        General: No tenderness.     Cervical back: Normal range of motion and neck supple.  Skin:    General: Skin is warm and dry.  Neurological:     Mental Status: She is alert and oriented to person, place, and time.  Psychiatric:        Behavior: Behavior normal.     ED Results /  Procedures / Treatments   Labs (all labs ordered are listed, but only abnormal results are displayed) Labs Reviewed  SARS CORONAVIRUS 2 BY RT PCR ([place], [place] [facility])    EKG None  Radiology DG Chest Portable 1 View  Result Date: 07/24/2019 CLINICAL DATA:  Cough and dizziness EXAM: PORTABLE CHEST 1 VIEW COMPARISON:  November 21, 2018 FINDINGS: The heart size and mediastinal contours are within normal limits. Loop recorder is seen. Both lungs are clear. The visualized skeletal structures are unremarkable. IMPRESSION: No active disease. Electronically Signed   By: Prudencio Pair M.D.   On: 07/24/2019 18:39    Procedures Procedures (including critical care time) Discussed smoking cessation with patient and was they were offerred resources to help stop.  Total time was 5 min CPT code 99406.   Medications Ordered in ED Medications - No data to display  ED Course  I have reviewed the triage vital signs and the nursing notes.  Pertinent labs & imaging results that were available during my care of the patient were reviewed by me and considered in my medical decision making (see chart for details).    MDM Rules/Calculators/A&P                          54 yo F with uri symptoms.  CXR negative. D/c home.   CHIZARA MENA was evaluated in Emergency Department on 07/24/2019 for the symptoms described in the history of present illness. He/she was evaluated in the context of the global COVID-19 pandemic, which necessitated consideration that the patient might be at risk for infection with the SARS-CoV-2 virus that causes COVID-19. Institutional protocols and algorithms that pertain to the evaluation of patients  at risk for COVID-19 are in a state of rapid change based on information released by regulatory bodies including the CDC and federal and state organizations. These policies and algorithms were followed during the patient's care in the ED.   7:07 PM:  I  have discussed the diagnosis/risks/treatment options with the patient and believe the pt to be eligible for discharge home to follow-up with PCP. We also discussed returning to the ED immediately if new or worsening sx occur. We discussed the sx which are most concerning (e.g., sudden worsening pain, fever, inability to tolerate by mouth) that necessitate immediate return. Medications administered to the patient during their visit and any new prescriptions provided to the patient are listed below.  Medications given during this visit Medications - No data to display   The patient appears reasonably screen and/or stabilized for discharge and I doubt any other medical condition or other [facility] requiring further screening, evaluation, or treatment in the ED at this time prior to discharge.   Final Clinical Impression(s) / ED Diagnoses Final diagnoses:  Viral URI with cough    Rx / DC Orders ED Discharge Orders         Ordered    benzonatate (TESSALON) 100 MG capsule  Every 8 hours     Discontinue  Reprint     07/24/19 1901           Deno Etienne, DO 07/24/19 [place], [place], DO 07/24/19 1910

## 2019-11-18 ENCOUNTER — Other Ambulatory Visit: Payer: Self-pay

## 2019-11-18 ENCOUNTER — Emergency Department (HOSPITAL_BASED_OUTPATIENT_CLINIC_OR_DEPARTMENT_OTHER): Payer: Self-pay

## 2019-11-18 ENCOUNTER — Encounter (HOSPITAL_BASED_OUTPATIENT_CLINIC_OR_DEPARTMENT_OTHER): Payer: Self-pay | Admitting: *Deleted

## 2019-11-18 ENCOUNTER — Emergency Department (HOSPITAL_BASED_OUTPATIENT_CLINIC_OR_DEPARTMENT_OTHER)
Admission: EM | Admit: 2019-11-18 | Discharge: 2019-11-18 | Disposition: A | Discharge status: Home / Self Care | Payer: Self-pay | Attending: Emergency Medicine | Admitting: Emergency Medicine

## 2019-11-18 DIAGNOSIS — N183 Chronic kidney disease, stage 3 unspecified: Secondary | ICD-10-CM | POA: Insufficient documentation

## 2019-11-18 DIAGNOSIS — I129 Hypertensive chronic kidney disease with stage 1 through stage 4 chronic kidney disease, or unspecified chronic kidney disease: Secondary | ICD-10-CM | POA: Insufficient documentation

## 2019-11-18 DIAGNOSIS — Z794 Long term (current) use of insulin: Secondary | ICD-10-CM | POA: Insufficient documentation

## 2019-11-18 DIAGNOSIS — R200 Anesthesia of skin: Secondary | ICD-10-CM

## 2019-11-18 DIAGNOSIS — Z79899 Other long term (current) drug therapy: Secondary | ICD-10-CM | POA: Insufficient documentation

## 2019-11-18 DIAGNOSIS — E119 Type 2 diabetes mellitus without complications: Secondary | ICD-10-CM | POA: Insufficient documentation

## 2019-11-18 DIAGNOSIS — R202 Paresthesia of skin: Secondary | ICD-10-CM | POA: Insufficient documentation

## 2019-11-18 DIAGNOSIS — Z8673 Personal history of transient ischemic attack (TIA), and cerebral infarction without residual deficits: Secondary | ICD-10-CM | POA: Insufficient documentation

## 2019-11-18 DIAGNOSIS — E1165 Type 2 diabetes mellitus with hyperglycemia: Secondary | ICD-10-CM

## 2019-11-18 DIAGNOSIS — F17210 Nicotine dependence, cigarettes, uncomplicated: Secondary | ICD-10-CM | POA: Insufficient documentation

## 2019-11-18 DIAGNOSIS — Z7984 Long term (current) use of oral hypoglycemic drugs: Secondary | ICD-10-CM | POA: Insufficient documentation

## 2019-11-18 DIAGNOSIS — M545 Low back pain, unspecified: Secondary | ICD-10-CM

## 2019-11-18 DIAGNOSIS — Z7901 Long term (current) use of anticoagulants: Secondary | ICD-10-CM | POA: Insufficient documentation

## 2019-11-18 LAB — URINALYSIS, ROUTINE W REFLEX MICROSCOPIC
Bilirubin Urine: NEGATIVE
Glucose, UA: >=500 mg/dL — AB
Hgb urine dipstick: NEGATIVE
Ketones, ur: NEGATIVE mg/dL
Nitrite: NEGATIVE
Protein, ur: NEGATIVE mg/dL
Specific Gravity, Urine: <1.005 — ABNORMAL LOW (ref 1.005–1.030)
pH: 6 (ref 5.0–8.0)

## 2019-11-18 LAB — CBC
HCT: 47.3 % — ABNORMAL HIGH (ref 36.0–46.0)
Hemoglobin: 15.9 g/dL — ABNORMAL HIGH (ref 12.0–15.0)
MCH: 31.4 pg (ref 26.0–34.0)
MCHC: 33.6 g/dL (ref 30.0–36.0)
MCV: 93.5 fL (ref 80.0–100.0)
Platelets: 219 10*3/uL (ref 150–400)
RBC: 5.06 MIL/uL (ref 3.87–5.11)
RDW: 13.1 % (ref 11.5–15.5)
WBC: 12.1 10*3/uL — ABNORMAL HIGH (ref 4.0–10.5)
nRBC: 0 % (ref 0.0–0.2)

## 2019-11-18 LAB — CBG MONITORING, ED
Glucose-Capillary: 584 mg/dL (ref 70–99)
Glucose-Capillary: 334 mg/dL — ABNORMAL HIGH (ref 70–99)

## 2019-11-18 LAB — I-STAT VENOUS BLOOD GAS, ED
Acid-Base Excess: 3 mmol/L — ABNORMAL HIGH (ref 0.0–2.0)
Bicarbonate: 26.1 mmol/L (ref 20.0–28.0)
Calcium, Ion: 1.13 mmol/L — ABNORMAL LOW (ref 1.15–1.40)
HCT: 49 % — ABNORMAL HIGH (ref 36.0–46.0)
Hemoglobin: 16.7 g/dL — ABNORMAL HIGH (ref 12.0–15.0)
O2 Saturation: 87 %
Patient temperature: 99.1
Potassium: 3.9 mmol/L (ref 3.5–5.1)
Sodium: 131 mmol/L — ABNORMAL LOW (ref 135–145)
TCO2: 27 mmol/L (ref 22–32)
pCO2, Ven: 36.7 mmHg — ABNORMAL LOW (ref 44.0–60.0)
pH, Ven: 7.46 — ABNORMAL HIGH (ref 7.250–7.430)
pO2, Ven: 51 mmHg — ABNORMAL HIGH (ref 32.0–45.0)

## 2019-11-18 LAB — BASIC METABOLIC PANEL
Anion gap: 12 (ref 5–15)
BUN: 9 mg/dL (ref 6–20)
CO2: 23 mmol/L (ref 22–32)
Calcium: 9.4 mg/dL (ref 8.9–10.3)
Chloride: 94 mmol/L — ABNORMAL LOW (ref 98–111)
Creatinine, Ser: 0.99 mg/dL (ref 0.44–1.00)
GFR, Estimated: >60 mL/min (ref >60)
Glucose, Bld: 564 mg/dL (ref 70–99)
Potassium: 4 mmol/L (ref 3.5–5.1)
Sodium: 129 mmol/L — ABNORMAL LOW (ref 135–145)

## 2019-11-18 LAB — URINALYSIS, MICROSCOPIC (REFLEX)

## 2019-11-18 MED ORDER — FLUCONAZOLE 200 MG PO TABS
200.0000 mg | ORAL_TABLET | Freq: Once | ORAL | Status: DC
  Filled 2019-11-18: qty 1

## 2019-11-18 MED ORDER — FLUCONAZOLE 150 MG PO TABS: 150.0000 mg | ORAL_TABLET | Freq: Once | ORAL | Status: AC

## 2019-11-18 MED ORDER — FLUCONAZOLE 150 MG PO TABS
ORAL_TABLET | ORAL | Status: AC
  Administered 2019-11-18: 150 mg via ORAL
  Filled 2019-11-18: qty 2

## 2019-11-18 MED ORDER — CLOPIDOGREL BISULFATE 75 MG PO TABS: 75.0000 mg | ORAL_TABLET | Freq: Every day | ORAL | 0 refills | Status: DC

## 2019-11-18 MED ORDER — INSULIN REGULAR HUMAN 100 UNIT/ML IJ SOLN
5.0000 [IU] | Freq: Once | INTRAMUSCULAR | Status: DC
  Filled 2019-11-18: qty 3

## 2019-11-18 MED ORDER — INSULIN ASPART 100 UNIT/ML ~~LOC~~ SOLN
5.0000 [IU] | Freq: Once | SUBCUTANEOUS | Status: AC
  Administered 2019-11-18: 5 [IU] via INTRAVENOUS

## 2019-11-18 MED ORDER — METFORMIN HCL ER 500 MG PO TB24: 500.0000 mg | ORAL_TABLET | Freq: Every day | ORAL | 0 refills | Status: DC

## 2019-11-18 MED ORDER — SODIUM CHLORIDE 0.9 % IV BOLUS
2000.0000 mL | Freq: Once | INTRAVENOUS | Status: AC
  Administered 2019-11-18: 2000 mL via INTRAVENOUS

## 2019-11-18 MED ORDER — APIXABAN 5 MG PO TABS
5.0000 mg | ORAL_TABLET | Freq: Two times a day (BID) | ORAL | 0 refills | Status: DC
Start: 2019-11-18 — End: 2021-03-28

## 2019-11-18 MED ORDER — GLIMEPIRIDE 2 MG PO TABS: 2.0000 mg | ORAL_TABLET | Freq: Every morning | ORAL | 0 refills | Status: DC

## 2019-11-18 MED ORDER — INSULIN ASPART 100 UNIT/ML ~~LOC~~ SOLN
SUBCUTANEOUS | Status: AC
  Filled 2019-11-18: qty 5

## 2019-11-18 MED ORDER — LISINOPRIL 20 MG PO TABS: 20.0000 mg | ORAL_TABLET | Freq: Every day | ORAL | 0 refills | Status: DC

## 2019-11-18 NOTE — ED Notes (Signed)
ED Provider at bedside. 

## 2019-11-18 NOTE — ED Notes (Signed)
Pt ambulated to RR with visitor.

## 2019-11-18 NOTE — ED Triage Notes (Signed)
Bilateral lower leg numbness since last month and is progressively getting worst.

## 2019-11-18 NOTE — ED Provider Notes (Signed)
[place] [facility] EMERGENCY DEPARTMENT Provider Note   CSN: [id number] Arrival date & time: 11/18/19  1006     History Chief Complaint  Patient presents with  . BLE numbness    [name] is a 54 y.o. female.  54 year old female with past medical history below including type 2 diabetes mellitus, hypertension, CVA, PE/DVT, anxiety/depression who presents with bilateral leg numbness.  Patient reports that over the past month, she has had progressively worsening numbness that started in bilateral feet and has moved up towards the thighs.  She denies any associated leg weakness.  She reports a couple of weeks of pain across her lower back but the numbness started several weeks before pain.  She denies any numbness in her hands or face.  She reports polyuria and polydipsia.  Her husband lost his job and therefore insurance 6 months ago and recently she has not been taking any of her medications or checking her blood sugar.  She reports some spots in her vision at times.  She denies any dysuria.  No fevers, recent illness, or IV drug use.  The history is provided by the patient.       Past Medical History:  Diagnosis Date  . Acid reflux   . Adrenal benign tumor    Apparently enlarging and patient "is supposed to have it taken out"  . Anxiety   . Depression   . Diabetes mellitus without complication ([place])   . DVT (deep vein thrombosis) in pregnancy   . Hypertension   . PE (pulmonary embolism)   . Stroke [facility])     Patient Active Problem List   Diagnosis Date Noted  . CKD (chronic kidney disease), stage III ([place]) 05/17/2016  . Type 2 diabetes mellitus with vascular disease ([place]) 05/17/2016  . Essential hypertension 05/17/2016  . DVT (deep venous thrombosis) ([place]) 05/16/2016  . Chronic ischemic left PCA stroke 07/30/2013  . TIA (transient ischemic attack) 07/30/2013  . Posterior circulation stroke of uncertain pathology ([place]) 07/30/2013    Past Surgical History:   Procedure Laterality Date  . 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 LOOP RECORDER IMPLANT N/A 03/25/2014   Procedure: LOOP RECORDER IMPLANT;  Surgeon: [name], MD;  Location: [facility] CATH LAB;  Service: Cardiovascular;  Laterality: N/A;  . NO PAST SURGERIES    . TEE WITHOUT CARDIOVERSION N/A 03/25/2014   Procedure: TRANSESOPHAGEAL ECHOCARDIOGRAM (TEE);  Surgeon: [name], MD;  Location: [facility] ENDOSCOPY;  Service: Cardiovascular;  Laterality: N/A;     OB History    Gravida  2   Para  2   Term  2   Preterm  0   AB  0   Living  2     SAB  0   TAB  0   Ectopic  0   Multiple  0   Live Births              Family History  Problem Relation Age of Onset  . Heart attack Father 30    Social History   Tobacco Use  . Smoking status: Current Every Day Smoker    Packs/day: 1.50    Years: 40.00    Pack years: 60.00    Types: Cigarettes  . Smokeless tobacco: Never Used  Vaping Use  .  Vaping Use: Never used  Substance Use Topics  . Alcohol use: No    Alcohol/week: 0.0 standard drinks  . Drug use: No    Home Medications Prior to Admission medications   Medication Sig Start Date End Date Taking? Authorizing Provider  ALPRAZolam [name]) 1 MG tablet Take 1 mg by mouth at bedtime. 02/20/19   [provider]  apixaban (ELIQUIS) 5 MG TABS tablet Take 1 tablet (5 mg total) by mouth 2 (two) times daily. 11/18/19   Fairy Ashlock, Wenda Overland, MD  clopidogrel (PLAVIX) 75 MG tablet Take 1 tablet (75 mg total) by mouth daily. 11/18/19   Michol Emory, Wenda Overland, MD  diphenhydramine-acetaminophen (TYLENOL PM) 25-500 MG TABS tablet Take 2 tablets by mouth at bedtime as needed (sleep).    [provider]  FARXIGA 10 MG TABS tablet Take 10 mg by mouth daily.  03/14/16   [provider]  fenofibrate 160 MG tablet Take 1 tablet (160 mg total) by mouth daily. Patient not taking: Reported on 03/04/2019 07/31/13   [name], MD  fenofibrate 54 MG tablet Take 54 mg by mouth daily. 01/26/19   [provider]  glimepiride (AMARYL) 2 MG tablet Take 1 tablet (2 mg total) by mouth every morning. 11/18/19   Lempi Edwin, Wenda Overland, MD  lisinopril (ZESTRIL) 20 MG tablet Take 1 tablet (20 mg total) by mouth daily. 11/18/19 12/18/19  Abubakar Crispo, Wenda Overland, MD  meloxicam (MOBIC) 7.5 MG tablet Take 1 tablet (7.5 mg total) by mouth daily. Patient not taking: Reported on 03/04/2019 01/17/19   Mesner, Corene Cornea, MD  metFORMIN (GLUCOPHAGE-XR) 500 MG 24 hr tablet Take 1 tablet (500 mg total) by mouth daily with breakfast. 11/18/19   Conard Alvira, Wenda Overland, MD  methocarbamol (ROBAXIN) 500 MG tablet Take 1 tablet (500 mg total) by mouth every 8 (eight) hours as needed for muscle spasms. 01/17/19   Mesner, Corene Cornea, MD  ondansetron (ZOFRAN) 4 MG tablet Take 1 tablet (4 mg total) by mouth every 6 (six) hours as needed for nausea or vomiting. 09/07/18   Cardama, Grayce Sessions, MD  PARoxetine (PAXIL) 40 MG tablet Take 40 mg by mouth every morning.    [provider]  pravastatin (PRAVACHOL) 20 MG tablet Take 20 mg by mouth daily.  03/08/14   [provider]  TRULICITY 3.14 HF/0.2OV SOPN Inject 0.75 mg into the skin every Monday. 01/26/19   [provider]    Allergies    Amoxicillin, Aspirin, Codeine, and Penicillins  Review of Systems   Review of Systems All other systems reviewed and are negative except that which was mentioned in HPI  Physical Exam Updated Vital Signs BP 126/84 (BP Location: Right Arm)   Pulse (!) 101   Temp 99.1 F (37.3 C) (Oral)   Resp 19   Ht 5\' 6"  (1.676 m)   Wt 74.8 kg   LMP  (LMP Unknown) Comment: "a few months"  SpO2 98%   BMI 26.63 kg/m   Physical Exam Vitals and nursing note reviewed.  Constitutional:       General: She is not in acute distress.    Appearance: She is well-developed.  HENT:     Head: Normocephalic and atraumatic.  Eyes:     Conjunctiva/sclera: Conjunctivae normal.  Cardiovascular:     Rate and Rhythm: Normal rate and regular rhythm.     Heart sounds: Normal heart sounds. No murmur heard.   Pulmonary:     Effort: Pulmonary effort is normal.     Breath  sounds: Normal breath sounds.  Abdominal:     General: Bowel sounds are normal. There is no distension.     Palpations: Abdomen is soft.     Tenderness: There is no abdominal tenderness.  Musculoskeletal:     Cervical back: Neck supple.  Skin:    General: Skin is warm and dry.  Neurological:     Mental Status: She is alert and oriented to person, place, and time.     Sensory: No sensory deficit.     Motor: No weakness.     Gait: Gait normal.     Deep Tendon Reflexes: Reflexes normal.     Comments: Fluent speech  Psychiatric:        Judgment: Judgment normal.     ED Results / Procedures / Treatments   Labs (all labs ordered are listed, but only abnormal results are displayed) Labs Reviewed  URINALYSIS, ROUTINE W REFLEX MICROSCOPIC - Abnormal; Notable for the following components:      Result Value   Specific Gravity, Urine <1.005 (*)    Glucose, UA >=500 (*)    Leukocytes,Ua TRACE (*)    All other components within normal limits  BASIC METABOLIC PANEL - Abnormal; Notable for the following components:   Sodium 129 (*)    Chloride 94 (*)    Glucose, Bld 564 (*)    All other components within normal limits  CBC - Abnormal; Notable for the following components:   WBC 12.1 (*)    Hemoglobin 15.9 (*)    HCT 47.3 (*)    All other components within normal limits  URINALYSIS, MICROSCOPIC (REFLEX) - Abnormal; Notable for the following components:   Bacteria, UA MANY (*)    All other components within normal limits  CBG MONITORING, ED - Abnormal; Notable for the following components:   Glucose-Capillary 584 (*)     All other components within normal limits  I-STAT VENOUS BLOOD GAS, ED - Abnormal; Notable for the following components:   pH, Ven 7.460 (*)    pCO2, Ven 36.7 (*)    pO2, Ven 51.0 (*)    Acid-Base Excess 3.0 (*)    Sodium 131 (*)    Calcium, Ion 1.13 (*)    HCT 49.0 (*)    Hemoglobin 16.7 (*)    All other components within normal limits  CBG MONITORING, ED - Abnormal; Notable for the following components:   Glucose-Capillary 334 (*)    All other components within normal limits  URINE CULTURE    EKG EKG Interpretation  Date/Time:  Wednesday November 18 2019 12:23:46 EST Ventricular Rate:  99 PR Interval:    QRS Duration: 69 QT Interval:  335 QTC Calculation: 430 R Axis:   -37 Text Interpretation: Sinus tachycardia Ventricular bigeminy Biatrial enlargement Inferior infarct, old No significant change since last tracing Confirmed by Theotis Burrow [phone]) on 11/18/2019 1:45:29 PM   Radiology DG Lumbar Spine Complete  Result Date: 11/18/2019 CLINICAL DATA:  Low back pain with lower extremity numbness EXAM: LUMBAR SPINE - COMPLETE 4+ VIEW COMPARISON:  11/18/2018 FINDINGS: Five lumbar type vertebral segments. Vertebral body heights and alignment are maintained. No fracture identified. Mild disc height loss at L2-3. The remaining intervertebral disc spaces are relatively preserved. Minimal degenerative endplate changes. Mild lower lumbar facet arthrosis. Scattered abdominal aortic atherosclerotic calcification. IMPRESSION: Mild multilevel lumbar spondylosis, most pronounced at L2-3. No acute findings. Electronically Signed   By: Davina Poke D.O.   On: 11/18/2019 12:22    Procedures Procedures (including  critical care time)  Medications Ordered in ED Medications  sodium chloride 0.9 % bolus 2,000 mL (0 mLs Intravenous Stopped 11/18/19 1445)  fluconazole (DIFLUCAN) tablet 150 mg (150 mg Oral Given 11/18/19 1315)  insulin aspart (novoLOG) injection 5 Units (5 Units  Intravenous Given 11/18/19 1331)    ED Course  I have reviewed the triage vital signs and the nursing notes.  Pertinent labs & imaging results that were available during my care of the patient were reviewed by me and considered in my medical decision making (see chart for details).    MDM Rules/Calculators/A&P                          Pt w/ normal gait and neurologically intact on exam. Normal fine touch sensation b/l on my exam. Given sx started at feet and have gone upwards, sx not suggestive of cauda equina.  She may be developing diabetic neuropathy.  No signs or symptoms to suggest Linward Foster.  Because of her polyuria, polydipsia, and medication noncompliance, obtain fingerstick glucose which was in the 500s.  Lab work is reassuring against DKA, hyperglycemia without ketonuria or increased anion gap.  Have given the patient IV fluids and short acting insulin with improvement to the 300s.  I emphasized the importance of seeing a PCP and restarting all of her medications as best she is able to afford in the interim.  Counseled on diabetic diet.  Reviewed return precautions and patient voiced understanding. Final Clinical Impression(s) / ED Diagnoses Final diagnoses:  Numbness and tingling of foot  Hyperglycemia due to diabetes mellitus ([place])  Bilateral low back pain without sciatica, unspecified chronicity    Rx / DC Orders ED Discharge Orders         Ordered    clopidogrel (PLAVIX) 75 MG tablet  Daily        11/18/19 1442    glimepiride (AMARYL) 2 MG tablet   Every morning - 10a        11/18/19 1442    lisinopril (ZESTRIL) 20 MG tablet  Daily        11/18/19 1442    metFORMIN (GLUCOPHAGE-XR) 500 MG 24 hr tablet  Daily with breakfast        11/18/19 1442    apixaban (ELIQUIS) 5 MG TABS tablet  2 times daily        11/18/19 1442           Domenico Achord, Wenda Overland, MD 11/18/19 1454

## 2019-11-20 LAB — URINE CULTURE: Culture: >=100000 — AB

## 2019-11-21 ENCOUNTER — Telehealth: Payer: Self-pay | Admitting: *Deleted

## 2019-11-21 NOTE — Telephone Encounter (Signed)
Post ED Visit - Positive Culture Follow-up  Culture report reviewed by antimicrobial stewardship pharmacist: [place] Team []  Elenor Quinones, Pharm.D. []  Heide Guile, Pharm.D., BCPS AQ-ID []  Parks Neptune, Pharm.D., BCPS []  Alycia Rossetti, Pharm.D., BCPS []  Theodore, Pharm.D., BCPS, AAHIVP []  Legrand Como, Pharm.D., BCPS, AAHIVP []  Salome Arnt, PharmD, BCPS []  Johnnette Gourd, PharmD, BCPS []  Hughes Better, PharmD, BCPS []  Leeroy Cha, PharmD []  Laqueta Linden, PharmD, BCPS []  Albertina Parr, PharmD  [place] Team []  Leodis Sias, PharmD []  Lindell Spar, PharmD []  Royetta Asal, PharmD []  Graylin Shiver, Rph []  Rema Fendt) Glennon Mac, PharmD []  Arlyn Dunning, PharmD []  Netta Cedars, PharmD []  Dia Sitter, PharmD []  Leone Haven, PharmD []  Gretta Arab, PharmD []  Theodis Shove, PharmD []  Peggyann Juba, PharmD []  Reuel Boom, PharmD   Positive urine culture Aymptomatic and no further patient follow-up is required at this time. Nuala Alpha, PA-C  Harlon Flor [place] 11/21/2019, 10:58 AM

## 2020-01-20 ENCOUNTER — Emergency Department (HOSPITAL_BASED_OUTPATIENT_CLINIC_OR_DEPARTMENT_OTHER)
Admission: EM | Admit: 2020-01-20 | Discharge: 2020-01-20 | Disposition: A | Discharge status: Home / Self Care | Payer: Medicaid Other | Attending: Emergency Medicine | Admitting: Emergency Medicine

## 2020-01-20 ENCOUNTER — Encounter (HOSPITAL_BASED_OUTPATIENT_CLINIC_OR_DEPARTMENT_OTHER): Payer: Self-pay | Admitting: Emergency Medicine

## 2020-01-20 ENCOUNTER — Other Ambulatory Visit: Payer: Self-pay

## 2020-01-20 DIAGNOSIS — R079 Chest pain, unspecified: Secondary | ICD-10-CM | POA: Insufficient documentation

## 2020-01-20 DIAGNOSIS — R0602 Shortness of breath: Secondary | ICD-10-CM | POA: Insufficient documentation

## 2020-01-20 DIAGNOSIS — Z5321 Procedure and treatment not carried out due to patient leaving prior to being seen by health care provider: Secondary | ICD-10-CM | POA: Insufficient documentation

## 2020-01-20 DIAGNOSIS — R6883 Chills (without fever): Secondary | ICD-10-CM | POA: Insufficient documentation

## 2020-01-20 DIAGNOSIS — L03011 Cellulitis of right finger: Secondary | ICD-10-CM | POA: Insufficient documentation

## 2020-01-20 DIAGNOSIS — M791 Myalgia, unspecified site: Secondary | ICD-10-CM | POA: Insufficient documentation

## 2020-01-20 NOTE — ED Notes (Signed)
Signature pad not working on the computer. Pt verbalized understanding of leaving AMA. Pt states she understandings the risk of discontinuing care at this time.

## 2020-01-20 NOTE — ED Provider Notes (Signed)
I arrived at bedside to assess patient and she was dressed in waiting by the door.  She states that she would like to leave AMA.  She does not wish to be assessed or answer any questions at this time.   Patient wishes to leave [place]. I personally explained need for further testing and my concerns for adverse outcomes if workup is incomplete. Specific concerns explained to patient include worsening symptoms, functional loss, long term sickness and death. Patient states understanding of risks and states they will return if they feel the need to at a later date to receive the recommended care or any other care at any time, regardless of their ability to pay for such care. Patient understands they are able to return at any time. Patient is able to explain back the risks of leaving AMA and still wishes to leave.   Specifically I recommend she stay to evaluate and exclude The emergent causes of chest pain which include: Acute coronary syndrome, tamponade, pericarditis/myocarditis, aortic dissection, pulmonary embolism, tension pneumothorax, pneumonia, and esophageal rupture  The patient is oriented to person, place, and time, has the capacity to make decisions regarding the medical care offered. The patient speaks coherently and exhibits no evidence of having an altered level of consciousness or alcohol or drug intoxication to a point that would impair judgment. They respond knowingly to questions about recommended treatment and alternate treatments including no further testing or treatment; participate in diagnostic and treatment decisions by means of rational thought processes; and understand the items of minimum basic medical treatment information with respect to that treatment (the nature and seriousness of the illness, the nature of the treatment, the probable degree and duration of any benefits and risks of any medical intervention that is being recommended, and the consequences of lack of  treatment, and the nature, risks, and benefits of any reasonable alternatives).  The patient understands the relevant information of the nature of their medical condition, as well as the risks, benefits, and treatment alternatives (including non-treatment), consequences of refusing care, and can competently communicate a rational explanation about their choice of care options.   Included in AVS was the following message:  You have chosen to leave [place]. Should you change your mind, you are always welcome and encouraged to return to the ED. You are encouraged to follow-up with, at the very least, a primary care provider, or other similar medical professional on this matter.    Pati Gallo [place], [place] 01/20/20 1605    Quintella Reichert, MD 01/20/20 2115

## 2020-01-20 NOTE — Discharge Instructions (Signed)
You have chosen to leave AGAINST MEDICAL ADVICE. Should you change your mind, you are always welcome and encouraged to return to the ED. You are encouraged to follow-up with, at the very least, a primary care provider, or other similar medical professional on this matter.  

## 2020-01-20 NOTE — ED Notes (Signed)
Pt states she did not want to wait any longer and is going to go home. EDP to bedside to talk with pt.

## 2020-01-20 NOTE — ED Triage Notes (Signed)
Reports central chest pressure with SOB for about a week.  Also having body aches and chills.  Also has an infection to the right ring finger.  Previously on an antibiotic.  Appears red and swollen.

## 2020-10-27 ENCOUNTER — Emergency Department (HOSPITAL_BASED_OUTPATIENT_CLINIC_OR_DEPARTMENT_OTHER)
Admission: EM | Admit: 2020-10-27 | Discharge: 2020-10-27 | Disposition: A | Discharge status: Home / Self Care | Payer: Self-pay | Attending: Emergency Medicine | Admitting: Emergency Medicine

## 2020-10-27 ENCOUNTER — Emergency Department (HOSPITAL_BASED_OUTPATIENT_CLINIC_OR_DEPARTMENT_OTHER): Payer: Self-pay

## 2020-10-27 ENCOUNTER — Encounter (HOSPITAL_BASED_OUTPATIENT_CLINIC_OR_DEPARTMENT_OTHER): Payer: Self-pay

## 2020-10-27 ENCOUNTER — Other Ambulatory Visit: Payer: Self-pay

## 2020-10-27 DIAGNOSIS — M5432 Sciatica, left side: Secondary | ICD-10-CM | POA: Insufficient documentation

## 2020-10-27 DIAGNOSIS — E1122 Type 2 diabetes mellitus with diabetic chronic kidney disease: Secondary | ICD-10-CM | POA: Insufficient documentation

## 2020-10-27 DIAGNOSIS — Z7901 Long term (current) use of anticoagulants: Secondary | ICD-10-CM | POA: Insufficient documentation

## 2020-10-27 DIAGNOSIS — F17210 Nicotine dependence, cigarettes, uncomplicated: Secondary | ICD-10-CM | POA: Insufficient documentation

## 2020-10-27 DIAGNOSIS — I129 Hypertensive chronic kidney disease with stage 1 through stage 4 chronic kidney disease, or unspecified chronic kidney disease: Secondary | ICD-10-CM | POA: Insufficient documentation

## 2020-10-27 DIAGNOSIS — Z86011 Personal history of benign neoplasm of the brain: Secondary | ICD-10-CM | POA: Insufficient documentation

## 2020-10-27 DIAGNOSIS — Z7902 Long term (current) use of antithrombotics/antiplatelets: Secondary | ICD-10-CM | POA: Insufficient documentation

## 2020-10-27 DIAGNOSIS — Z79899 Other long term (current) drug therapy: Secondary | ICD-10-CM | POA: Insufficient documentation

## 2020-10-27 DIAGNOSIS — Z7984 Long term (current) use of oral hypoglycemic drugs: Secondary | ICD-10-CM | POA: Insufficient documentation

## 2020-10-27 DIAGNOSIS — N183 Chronic kidney disease, stage 3 unspecified: Secondary | ICD-10-CM | POA: Insufficient documentation

## 2020-10-27 MED ORDER — LIDOCAINE 5 % EX PTCH
1.0000 | MEDICATED_PATCH | CUTANEOUS | Status: DC
  Administered 2020-10-27: 1 via TRANSDERMAL
  Filled 2020-10-27: qty 1

## 2020-10-27 MED ORDER — HYDROCODONE-ACETAMINOPHEN 5-325 MG PO TABS
1.0000 | ORAL_TABLET | ORAL | 0 refills | Status: DC | PRN
Start: 2020-10-27 — End: 2021-03-24

## 2020-10-27 MED ORDER — METHOCARBAMOL 500 MG PO TABS: 500.0000 mg | ORAL_TABLET | Freq: Three times a day (TID) | ORAL | 0 refills | Status: DC | PRN

## 2020-10-27 NOTE — Discharge Instructions (Addendum)
Apply warm compresses to sore muscle for 20 minutes at a time followed by gentle stretching.  Take Norco as needed for pain not controlled with Robaxin.  Follow-up with your doctor for recheck.

## 2020-10-27 NOTE — ED Triage Notes (Signed)
Pt c/o left LE pain x 2-3 days-denies injury-states feels same as when she had a DVT in right LE-NAD-to triage in w/c

## 2020-10-27 NOTE — ED Notes (Signed)
Patient transported to Ultrasound 

## 2020-10-27 NOTE — ED Provider Notes (Signed)
[facility] EMERGENCY DEPARTMENT Provider Note   CSN: [id number] Arrival date & time: 10/27/20  1100     History Chief Complaint  Patient presents with   Leg Pain    [name] is a 55 y.o. female.  55 year old female with history of DVT, hypertension, PE, on Eliquis and Plavix complaint of pain in her left calf with pain in the left leg that radiates from her left buttock.  Reports history of DVT in her right leg, currently anticoagulated, concerned pain feels similar.  Denies injuries however states that her mother recently died, was a Ship broker, she is cleaning out her house and has been doing a lot more physical activity than usual.      Past Medical History:  Diagnosis Date   Acid reflux    Adrenal benign tumor    Apparently enlarging and patient "is supposed to have it taken out"   Anxiety    Depression    Diabetes mellitus without complication ([place])    DVT (deep vein thrombosis) in pregnancy    Hypertension    PE (pulmonary embolism)    Stroke 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;  Surgeon: Iran Planas, MD;  Location: [facility];  Service: Orthopedics;  Laterality: Right;  needs 60 min   LOOP RECORDER IMPLANT N/A 03/25/2014   Procedure: LOOP RECORDER IMPLANT;  Surgeon: [name], MD;  Location: [facility] CATH LAB;  Service: Cardiovascular;  Laterality: N/A;   NO  PAST SURGERIES     TEE WITHOUT CARDIOVERSION N/A 03/25/2014   Procedure: TRANSESOPHAGEAL ECHOCARDIOGRAM (TEE);  Surgeon: [name], MD;  Location: [facility] ENDOSCOPY;  Service: Cardiovascular;  Laterality: N/A;     OB History     Gravida  2   Para  2   Term  2   Preterm  0   AB  0   Living  2      SAB  0   IAB  0   Ectopic  0   Multiple  0   Live Births              Family History  Problem Relation Age of Onset   Heart attack Father 70    Social History   Tobacco Use   Smoking status: Every Day    Packs/day: 1.50    Years: 40.00    Pack years: 60.00    Types: Cigarettes   Smokeless tobacco: Never  Vaping Use   Vaping Use: Never used  Substance Use Topics   Alcohol use: No    Alcohol/week: 0.0 standard drinks   Drug use: No    Home Medications Prior to Admission medications   Medication Sig Start Date End Date Taking? Authorizing Provider  HYDROcodone-acetaminophen (NORCO/VICODIN) 5-325 MG tablet Take 1 tablet by mouth every 4 (four) hours as needed. 10/27/20  Yes Tacy Learn, PA-C  ALPRAZolam [name]) 1 MG tablet  Take 1 mg by mouth at bedtime. 02/20/19   [provider]  apixaban (ELIQUIS) 5 MG TABS tablet Take 1 tablet (5 mg total) by mouth 2 (two) times daily. 11/18/19   Little, Wenda Overland, MD  clopidogrel (PLAVIX) 75 MG tablet Take 1 tablet (75 mg total) by mouth daily. 11/18/19   Little, Wenda Overland, MD  diphenhydramine-acetaminophen (TYLENOL PM) 25-500 MG TABS tablet Take 2 tablets by mouth at bedtime as needed (sleep).    [provider]  FARXIGA 10 MG TABS tablet Take 10 mg by mouth daily. 03/14/16   [provider]  fenofibrate 160 MG tablet Take 1 tablet (160 mg total) by mouth daily. Patient not taking: Reported on 03/04/2019 07/31/13   [name], MD  fenofibrate 54 MG tablet Take 54 mg by mouth daily. 01/26/19   [provider]  glimepiride (AMARYL) 2 MG tablet Take 1 tablet (2 mg total) by mouth  every morning. 11/18/19   Little, Wenda Overland, MD  lisinopril (ZESTRIL) 20 MG tablet Take 1 tablet (20 mg total) by mouth daily. 11/18/19 12/18/19  Little, Wenda Overland, MD  meloxicam (MOBIC) 7.5 MG tablet Take 1 tablet (7.5 mg total) by mouth daily. Patient not taking: Reported on 03/04/2019 01/17/19   Mesner, Corene Cornea, MD  metFORMIN (GLUCOPHAGE-XR) 500 MG 24 hr tablet Take 1 tablet (500 mg total) by mouth daily with breakfast. 11/18/19   Little, Wenda Overland, MD  methocarbamol (ROBAXIN) 500 MG tablet Take 1 tablet (500 mg total) by mouth every 8 (eight) hours as needed for muscle spasms. 10/27/20   Tacy Learn, PA-C  ondansetron (ZOFRAN) 4 MG tablet Take 1 tablet (4 mg total) by mouth every 6 (six) hours as needed for nausea or vomiting. 09/07/18   Cardama, Grayce Sessions, MD  PARoxetine (PAXIL) 40 MG tablet Take 40 mg by mouth every morning.    [provider]  pravastatin (PRAVACHOL) 20 MG tablet Take 20 mg by mouth daily.  03/08/14   [provider]  TRULICITY 6.75 FF/6.3WG SOPN Inject 0.75 mg into the skin every Monday. 01/26/19   [provider]    Allergies    Amoxicillin, Aspirin, Codeine, and Penicillins  Review of Systems   Review of Systems  Constitutional:  Negative for fever.  Respiratory:  Negative for shortness of breath.   Cardiovascular:  Negative for chest pain.  Musculoskeletal:  Positive for arthralgias, back pain and myalgias.  Skin:  Negative for rash and wound.  Allergic/Immunologic: Positive for immunocompromised state.  Neurological:  Negative for weakness and numbness.  All other systems reviewed and are negative.  Physical Exam Updated Vital Signs BP 135/81 (BP Location: Left Arm)   Pulse (!) 117   Temp 98.3 F (36.8 C) (Oral)   Resp 18   Ht 5\' 6"  (1.676 m)   Wt 65.3 kg   LMP  (LMP Unknown) Comment: "a few months"  SpO2 96%   BMI 23.24 kg/m   Physical Exam Vitals and nursing note reviewed.  Constitutional:      General:  She is not in acute distress.    Appearance: She is well-developed. She is not diaphoretic.  HENT:     Head: Normocephalic and atraumatic.  Cardiovascular:     Pulses: Normal pulses.  Pulmonary:     Effort: Pulmonary effort is normal.  Abdominal:     Palpations: Abdomen is soft.     Tenderness: There is no abdominal tenderness.  Musculoskeletal:        General:  Tenderness present. No swelling, deformity or signs of injury.     Lumbar back: Tenderness present. No bony tenderness. Negative right straight leg raise test and negative left straight leg raise test.       Back:     Right lower leg: No edema.     Left lower leg: No edema.     Comments: TTP left SI. Mild calf tenderness on left without palpable cord, erythema, swelling.  Skin:    General: Skin is warm and dry.     Coloration: Skin is not pale.     Findings: No bruising, erythema or rash.  Neurological:     Mental Status: She is alert and oriented to person, place, and time.     Sensory: No sensory deficit.     Motor: No weakness.  Psychiatric:        Behavior: Behavior normal.    ED Results / Procedures / Treatments   Labs (all labs ordered are listed, but only abnormal results are displayed) Labs Reviewed - No data to display  EKG None  Radiology US Venous Img Lower  Left (DVT Study)  Result Date: 10/27/2020 CLINICAL DATA:  Left leg pain EXAM: LEFT LOWER EXTREMITY VENOUS DOPPLER ULTRASOUND TECHNIQUE: Gray-scale sonography with graded compression, as well as color Doppler and duplex ultrasound were performed to evaluate the lower extremity deep venous systems from the level of the common femoral vein and including the common femoral, femoral, profunda femoral, popliteal and calf veins including the posterior tibial, peroneal and gastrocnemius veins when visible. The superficial great saphenous vein was also interrogated. Spectral Doppler was utilized to evaluate flow at rest and with distal augmentation maneuvers in  the common femoral, femoral and popliteal veins. COMPARISON:  05/16/2016 FINDINGS: Contralateral Common Femoral Vein: Respiratory phasicity is normal and symmetric with the symptomatic side. No evidence of thrombus. Normal compressibility. Common Femoral Vein: No evidence of thrombus. Normal compressibility, respiratory phasicity and response to augmentation. Saphenofemoral Junction: No evidence of thrombus. Normal compressibility and flow on color Doppler imaging. Profunda Femoral Vein: No evidence of thrombus. Normal compressibility and flow on color Doppler imaging. Femoral Vein: No evidence of thrombus. Normal compressibility, respiratory phasicity and response to augmentation. Popliteal Vein: No evidence of thrombus. Normal compressibility, respiratory phasicity and response to augmentation. Calf Veins: No evidence of thrombus. Normal compressibility and flow on color Doppler imaging. Superficial Great Saphenous Vein: No evidence of thrombus. Normal compressibility. Venous Reflux:  None. Other Findings:  None. IMPRESSION: No evidence of left lower extremity deep venous thrombosis. Electronically Signed   By: Davina Poke D.O.   On: 10/27/2020 13:59    Procedures Procedures   Medications Ordered in ED Medications  lidocaine (LIDODERM) 5 % 1 patch (has no administration in time range)    ED Course  I have reviewed the triage vital signs and the nursing notes.  Pertinent labs & imaging results that were available during my care of the patient were reviewed by me and considered in my medical decision making (see chart for details).  Clinical Course as of 10/27/20 1428  Thu Oct 27, 7684  4342 55 year old female with complaint of pain in her left leg radiating from left lower back as above.  DVT study negative.  Suspect sciatica or pain from overuse while cleaning her mother's house.  Will apply Lidoderm patch and discharge with prescription for short course of Norco and Robaxin.  Recommend  recheck with PCP. [LM]    Clinical Course User Index [LM] Suella Broad  A, PA-C   MDM Rules/Calculators/A&P                           Final Clinical Impression(s) / ED Diagnoses Final diagnoses:  Sciatica of left side    Rx / DC Orders ED Discharge Orders          Ordered    methocarbamol (ROBAXIN) 500 MG tablet  Every 8 hours PRN        10/27/20 1422    HYDROcodone-acetaminophen (NORCO/VICODIN) 5-325 MG tablet  Every 4 hours PRN        10/27/20 1422             Roque Lias 10/27/20 1428    Godfrey Pick, MD 10/27/20 2156

## 2021-02-09 IMAGING — CR DG LUMBAR SPINE COMPLETE 4+V
5 series · 5 of 5 positions shown · non-contrast
Comparison: 11/18/2018

CLINICAL DATA: Low back pain with lower extremity numbness

EXAM:
LUMBAR SPINE - COMPLETE 4+ VIEW

[t l-spine a.p.]
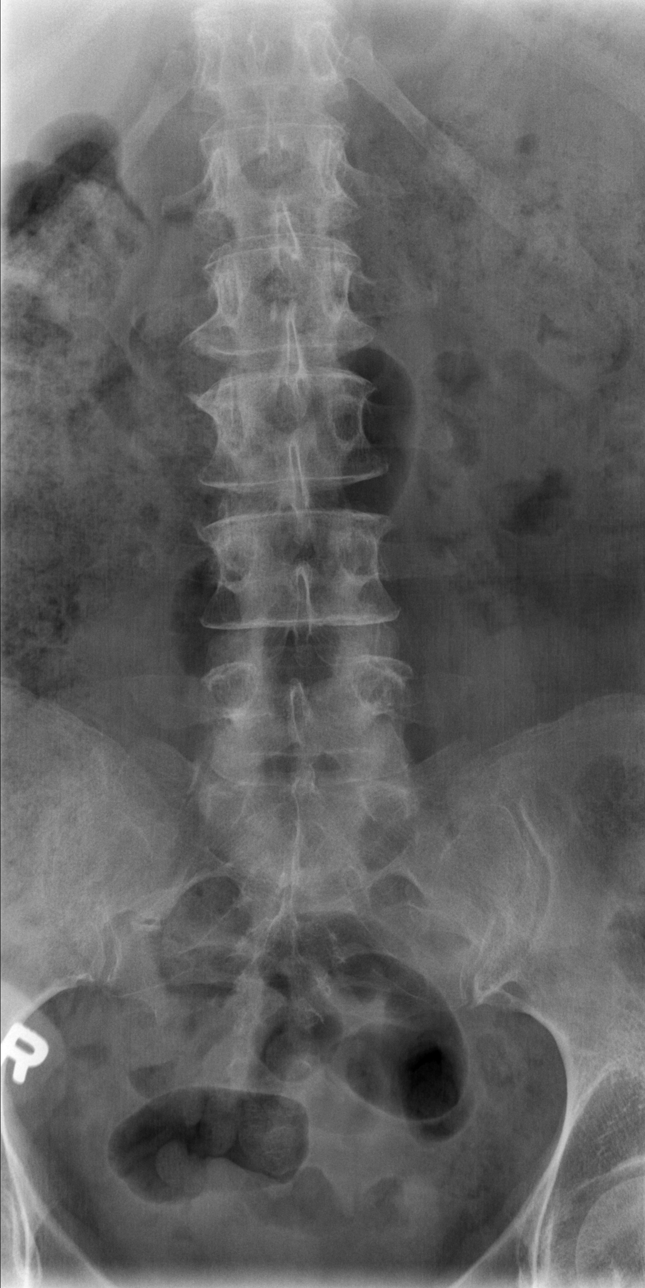

[t l-spine oblique exposure (1 of 2)]
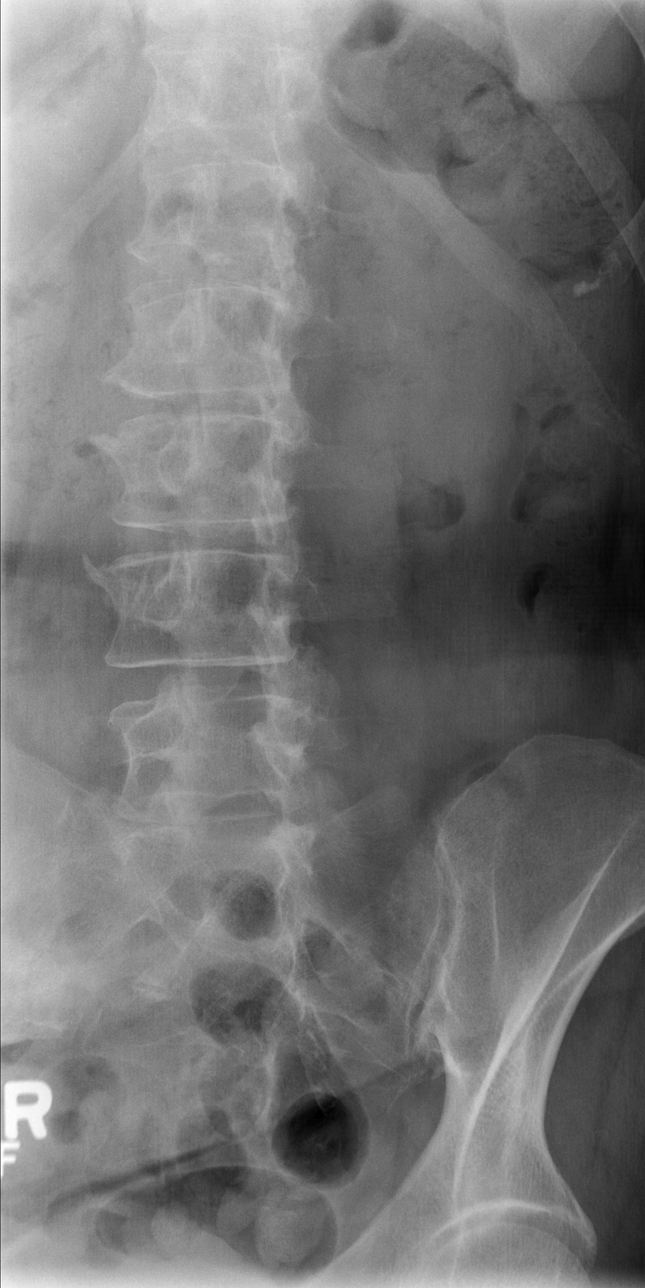

[t l-spine oblique exposure (2 of 2)]
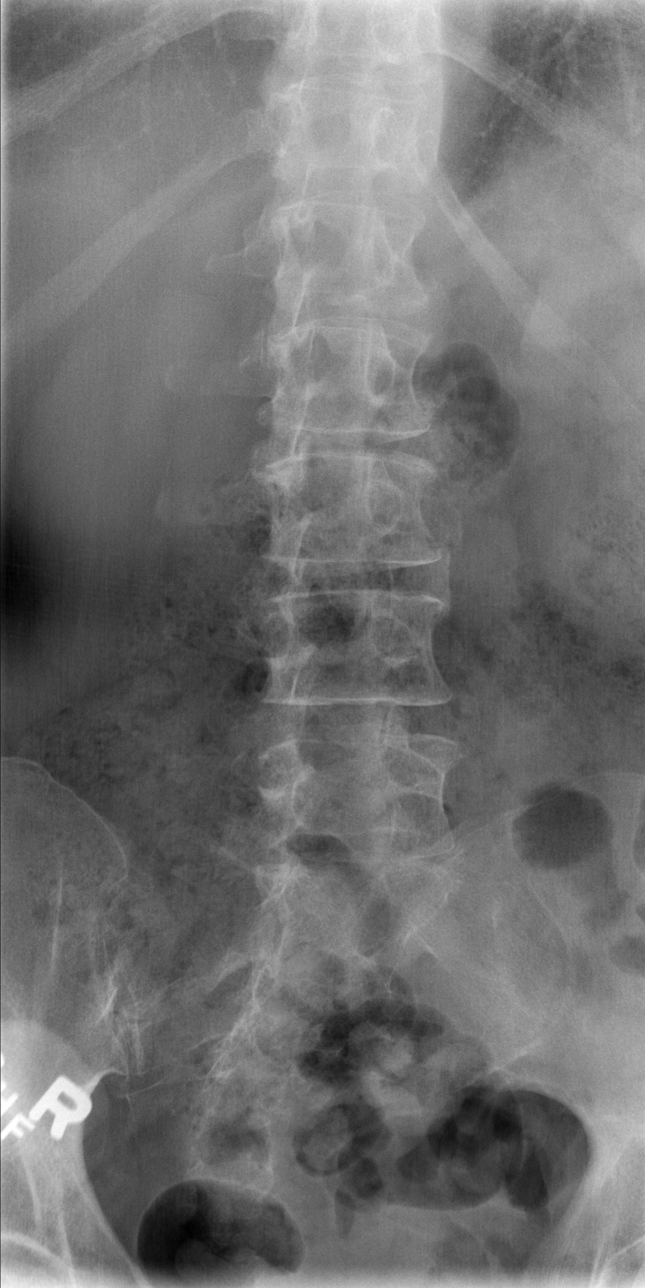

[t l-spine lat]
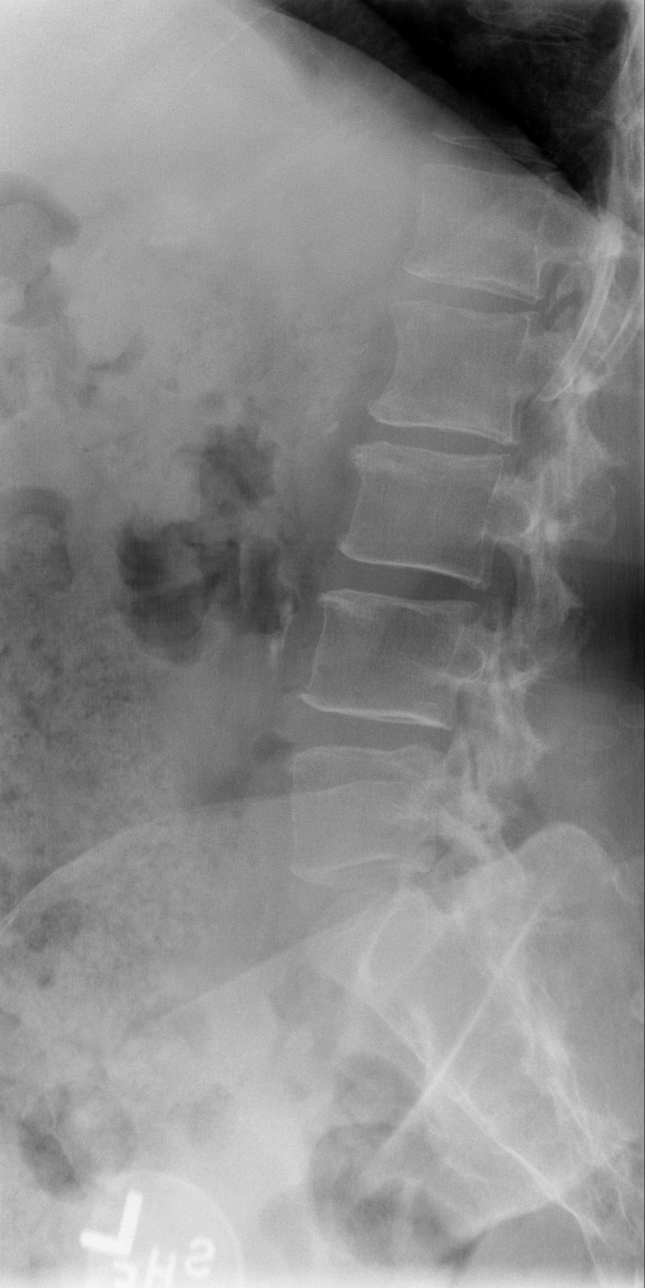

[t l-spine l5-s1 spot]
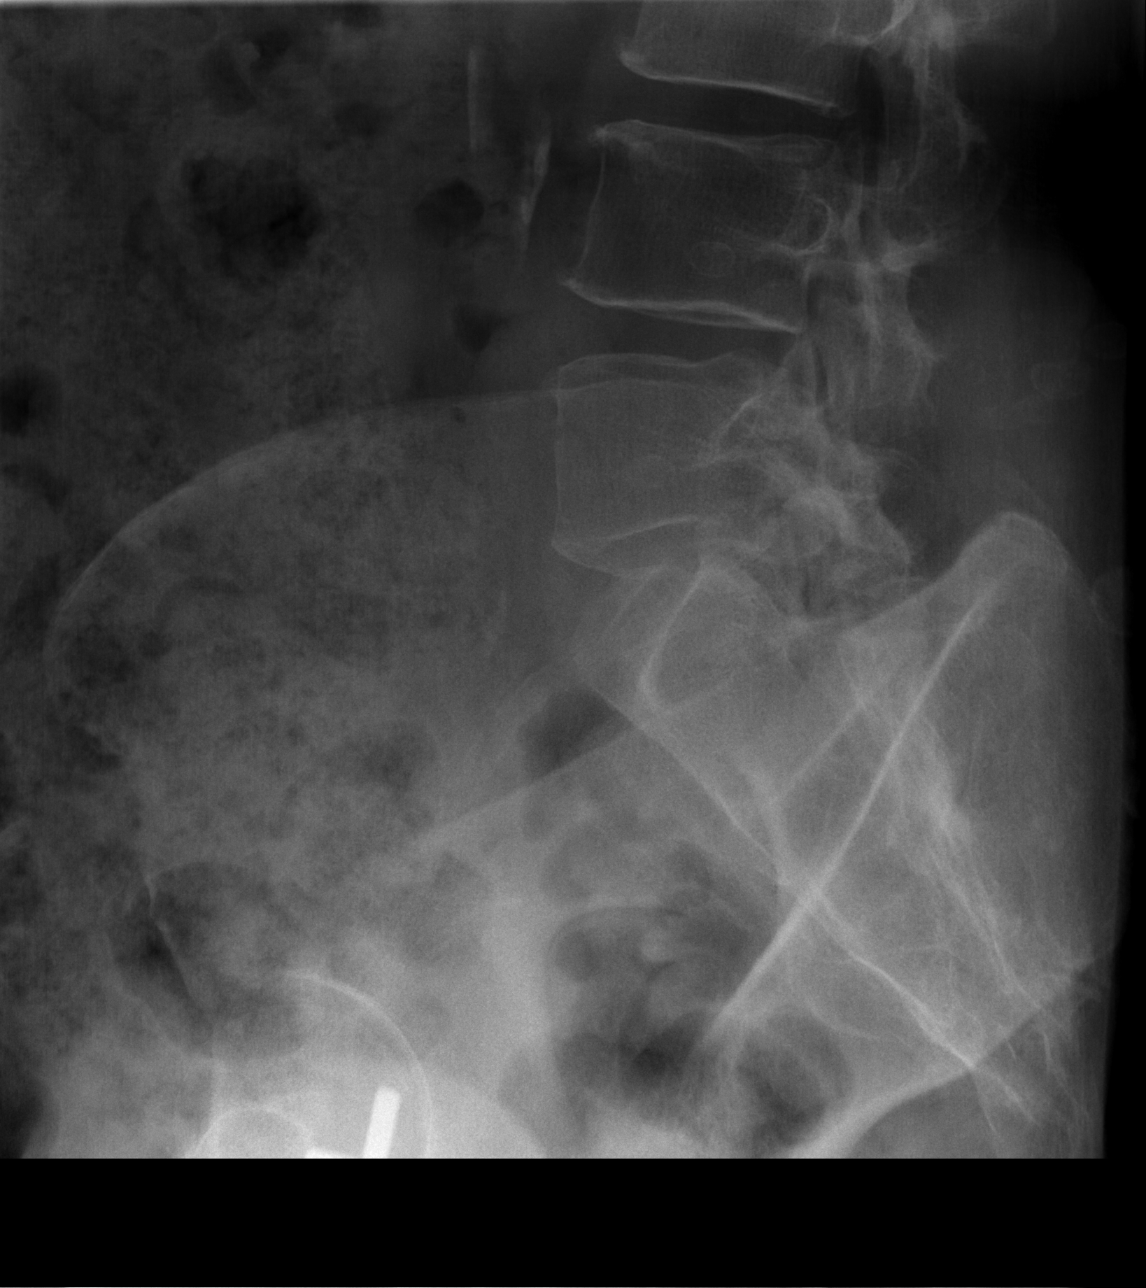

[5 of 5 positions shown; findings below may reference images not displayed]

FINDINGS: Five lumbar type vertebral segments. Vertebral body heights and
alignment are maintained. No fracture identified. Mild disc height
loss at L2-3. The remaining intervertebral disc spaces are
relatively preserved. Minimal degenerative endplate changes. Mild
lower lumbar facet arthrosis. Scattered abdominal aortic
atherosclerotic calcification.
IMPRESSION: Mild multilevel lumbar spondylosis, most pronounced at L2-3. No
acute findings.

## 2021-03-23 ENCOUNTER — Inpatient Hospital Stay (HOSPITAL_BASED_OUTPATIENT_CLINIC_OR_DEPARTMENT_OTHER)
Admission: EM | Admit: 2021-03-23 | Discharge: 2021-03-28 | DRG: 871 | Disposition: A | Discharge status: Home / Self Care | Payer: Medicaid Other | Attending: Family Medicine | Admitting: Family Medicine

## 2021-03-23 ENCOUNTER — Emergency Department (HOSPITAL_BASED_OUTPATIENT_CLINIC_OR_DEPARTMENT_OTHER): Payer: Self-pay

## 2021-03-23 ENCOUNTER — Other Ambulatory Visit: Payer: Self-pay

## 2021-03-23 ENCOUNTER — Encounter (HOSPITAL_BASED_OUTPATIENT_CLINIC_OR_DEPARTMENT_OTHER): Payer: Self-pay | Admitting: *Deleted

## 2021-03-23 DIAGNOSIS — Z91120 Patient's intentional underdosing of medication regimen due to financial hardship: Secondary | ICD-10-CM

## 2021-03-23 DIAGNOSIS — Z8673 Personal history of transient ischemic attack (TIA), and cerebral infarction without residual deficits: Secondary | ICD-10-CM

## 2021-03-23 DIAGNOSIS — Z7901 Long term (current) use of anticoagulants: Secondary | ICD-10-CM

## 2021-03-23 DIAGNOSIS — R6510 Systemic inflammatory response syndrome (SIRS) of non-infectious origin without acute organ dysfunction: Principal | ICD-10-CM | POA: Diagnosis present

## 2021-03-23 DIAGNOSIS — E876 Hypokalemia: Secondary | ICD-10-CM | POA: Diagnosis not present

## 2021-03-23 DIAGNOSIS — Z66 Do not resuscitate: Secondary | ICD-10-CM | POA: Diagnosis present

## 2021-03-23 DIAGNOSIS — R112 Nausea with vomiting, unspecified: Secondary | ICD-10-CM | POA: Diagnosis present

## 2021-03-23 DIAGNOSIS — E2749 Other adrenocortical insufficiency: Secondary | ICD-10-CM | POA: Diagnosis present

## 2021-03-23 DIAGNOSIS — F419 Anxiety disorder, unspecified: Secondary | ICD-10-CM | POA: Diagnosis present

## 2021-03-23 DIAGNOSIS — L8990 Pressure ulcer of unspecified site, unspecified stage: Secondary | ICD-10-CM | POA: Insufficient documentation

## 2021-03-23 DIAGNOSIS — Z86718 Personal history of other venous thrombosis and embolism: Secondary | ICD-10-CM

## 2021-03-23 DIAGNOSIS — E871 Hypo-osmolality and hyponatremia: Secondary | ICD-10-CM | POA: Diagnosis present

## 2021-03-23 DIAGNOSIS — K219 Gastro-esophageal reflux disease without esophagitis: Secondary | ICD-10-CM | POA: Diagnosis present

## 2021-03-23 DIAGNOSIS — I110 Hypertensive heart disease with heart failure: Secondary | ICD-10-CM | POA: Diagnosis present

## 2021-03-23 DIAGNOSIS — B961 Klebsiella pneumoniae [K. pneumoniae] as the cause of diseases classified elsewhere: Secondary | ICD-10-CM | POA: Diagnosis present

## 2021-03-23 DIAGNOSIS — Z885 Allergy status to narcotic agent status: Secondary | ICD-10-CM

## 2021-03-23 DIAGNOSIS — Z7984 Long term (current) use of oral hypoglycemic drugs: Secondary | ICD-10-CM

## 2021-03-23 DIAGNOSIS — Z7902 Long term (current) use of antithrombotics/antiplatelets: Secondary | ICD-10-CM

## 2021-03-23 DIAGNOSIS — J189 Pneumonia, unspecified organism: Secondary | ICD-10-CM

## 2021-03-23 DIAGNOSIS — J9601 Acute respiratory failure with hypoxia: Secondary | ICD-10-CM | POA: Diagnosis present

## 2021-03-23 DIAGNOSIS — E785 Hyperlipidemia, unspecified: Secondary | ICD-10-CM | POA: Diagnosis present

## 2021-03-23 DIAGNOSIS — Z9114 Patient's other noncompliance with medication regimen: Secondary | ICD-10-CM

## 2021-03-23 DIAGNOSIS — E1110 Type 2 diabetes mellitus with ketoacidosis without coma: Secondary | ICD-10-CM | POA: Diagnosis present

## 2021-03-23 DIAGNOSIS — I3139 Other pericardial effusion (noninflammatory): Secondary | ICD-10-CM | POA: Diagnosis present

## 2021-03-23 DIAGNOSIS — A419 Sepsis, unspecified organism: Principal | ICD-10-CM | POA: Diagnosis present

## 2021-03-23 DIAGNOSIS — Z20822 Contact with and (suspected) exposure to covid-19: Secondary | ICD-10-CM | POA: Diagnosis present

## 2021-03-23 DIAGNOSIS — N390 Urinary tract infection, site not specified: Secondary | ICD-10-CM | POA: Diagnosis present

## 2021-03-23 DIAGNOSIS — Z880 Allergy status to penicillin: Secondary | ICD-10-CM

## 2021-03-23 DIAGNOSIS — Z8249 Family history of ischemic heart disease and other diseases of the circulatory system: Secondary | ICD-10-CM

## 2021-03-23 DIAGNOSIS — E1165 Type 2 diabetes mellitus with hyperglycemia: Secondary | ICD-10-CM | POA: Diagnosis present

## 2021-03-23 DIAGNOSIS — Z886 Allergy status to analgesic agent status: Secondary | ICD-10-CM

## 2021-03-23 DIAGNOSIS — Z79899 Other long term (current) drug therapy: Secondary | ICD-10-CM

## 2021-03-23 DIAGNOSIS — I2699 Other pulmonary embolism without acute cor pulmonale: Secondary | ICD-10-CM

## 2021-03-23 DIAGNOSIS — J439 Emphysema, unspecified: Secondary | ICD-10-CM | POA: Diagnosis present

## 2021-03-23 DIAGNOSIS — F32A Depression, unspecified: Secondary | ICD-10-CM | POA: Diagnosis present

## 2021-03-23 DIAGNOSIS — I5030 Unspecified diastolic (congestive) heart failure: Secondary | ICD-10-CM | POA: Diagnosis present

## 2021-03-23 DIAGNOSIS — R079 Chest pain, unspecified: Secondary | ICD-10-CM

## 2021-03-23 DIAGNOSIS — T50996A Underdosing of other drugs, medicaments and biological substances, initial encounter: Secondary | ICD-10-CM | POA: Diagnosis present

## 2021-03-23 DIAGNOSIS — F17210 Nicotine dependence, cigarettes, uncomplicated: Secondary | ICD-10-CM | POA: Diagnosis present

## 2021-03-23 DIAGNOSIS — G459 Transient cerebral ischemic attack, unspecified: Secondary | ICD-10-CM | POA: Diagnosis present

## 2021-03-23 DIAGNOSIS — Z86711 Personal history of pulmonary embolism: Secondary | ICD-10-CM

## 2021-03-23 LAB — TROPONIN I (HIGH SENSITIVITY)
Troponin I (High Sensitivity): 14 ng/L (ref <18)
Troponin I (High Sensitivity): 19 ng/L — ABNORMAL HIGH (ref <18)

## 2021-03-23 LAB — CBC
HCT: 46.3 % — ABNORMAL HIGH (ref 36.0–46.0)
Hemoglobin: 15.9 g/dL — ABNORMAL HIGH (ref 12.0–15.0)
MCH: 31.9 pg (ref 26.0–34.0)
MCHC: 34.3 g/dL (ref 30.0–36.0)
MCV: 93 fL (ref 80.0–100.0)
Platelets: 137 10*3/uL — ABNORMAL LOW (ref 150–400)
RBC: 4.98 MIL/uL (ref 3.87–5.11)
RDW: 13.5 % (ref 11.5–15.5)
WBC: 11.7 10*3/uL — ABNORMAL HIGH (ref 4.0–10.5)
nRBC: 0 % (ref 0.0–0.2)

## 2021-03-23 LAB — I-STAT VENOUS BLOOD GAS, ED
Acid-Base Excess: 0 mmol/L (ref 0.0–2.0)
Bicarbonate: 25.1 mmol/L (ref 20.0–28.0)
Calcium, Ion: 1.25 mmol/L (ref 1.15–1.40)
HCT: 47 % — ABNORMAL HIGH (ref 36.0–46.0)
Hemoglobin: 16 g/dL — ABNORMAL HIGH (ref 12.0–15.0)
O2 Saturation: 20 %
Potassium: 3.4 mmol/L — ABNORMAL LOW (ref 3.5–5.1)
Sodium: 131 mmol/L — ABNORMAL LOW (ref 135–145)
TCO2: 26 mmol/L (ref 22–32)
pCO2, Ven: 42 mmHg — ABNORMAL LOW (ref 44–60)
pH, Ven: 7.384 (ref 7.25–7.43)
pO2, Ven: 16 mmHg — CL (ref 32–45)

## 2021-03-23 LAB — COMPREHENSIVE METABOLIC PANEL
ALT: 32 U/L (ref 0–44)
AST: 32 U/L (ref 15–41)
Albumin: 3.4 g/dL — ABNORMAL LOW (ref 3.5–5.0)
Alkaline Phosphatase: 96 U/L (ref 38–126)
Anion gap: 19 — ABNORMAL HIGH (ref 5–15)
BUN: 17 mg/dL (ref 6–20)
CO2: 20 mmol/L — ABNORMAL LOW (ref 22–32)
Calcium: 9.4 mg/dL (ref 8.9–10.3)
Chloride: 90 mmol/L — ABNORMAL LOW (ref 98–111)
Creatinine, Ser: 1.12 mg/dL — ABNORMAL HIGH (ref 0.44–1.00)
GFR, Estimated: 57 mL/min — ABNORMAL LOW (ref >60)
Glucose, Bld: 526 mg/dL (ref 70–99)
Potassium: 3.5 mmol/L (ref 3.5–5.1)
Sodium: 129 mmol/L — ABNORMAL LOW (ref 135–145)
Total Bilirubin: 0.8 mg/dL (ref 0.3–1.2)
Total Protein: 7.4 g/dL (ref 6.5–8.1)

## 2021-03-23 LAB — URINALYSIS, MICROSCOPIC (REFLEX)

## 2021-03-23 LAB — URINALYSIS, ROUTINE W REFLEX MICROSCOPIC
Bilirubin Urine: NEGATIVE
Glucose, UA: >=500 mg/dL — AB
Ketones, ur: 15 mg/dL — AB
Leukocytes,Ua: NEGATIVE
Nitrite: NEGATIVE
Protein, ur: NEGATIVE mg/dL
Specific Gravity, Urine: 1.015 (ref 1.005–1.030)
pH: 5 (ref 5.0–8.0)

## 2021-03-23 LAB — RESP PANEL BY RT-PCR (FLU A&B, COVID) ARPGX2
Influenza A by PCR: NEGATIVE
Influenza B by PCR: NEGATIVE
SARS Coronavirus 2 by RT PCR: NEGATIVE

## 2021-03-23 LAB — LACTIC ACID, PLASMA
Lactic Acid, Venous: 3.6 mmol/L (ref 0.5–1.9)
Lactic Acid, Venous: 2.4 mmol/L (ref 0.5–1.9)

## 2021-03-23 LAB — BRAIN NATRIURETIC PEPTIDE: B Natriuretic Peptide: 139.2 pg/mL — ABNORMAL HIGH (ref 0.0–100.0)

## 2021-03-23 MED ORDER — VANCOMYCIN HCL 500 MG IV SOLR
INTRAVENOUS | Status: AC
  Filled 2021-03-23: qty 10

## 2021-03-23 MED ORDER — IOHEXOL 350 MG/ML SOLN
75.0000 mL | Freq: Once | INTRAVENOUS | Status: AC | PRN
  Administered 2021-03-23: 75 mL via INTRAVENOUS

## 2021-03-23 MED ORDER — SODIUM CHLORIDE 0.9 % IV SOLN
2.0000 g | Freq: Once | INTRAVENOUS | Status: AC
  Administered 2021-03-23: 2 g via INTRAVENOUS
  Filled 2021-03-23: qty 2

## 2021-03-23 MED ORDER — SODIUM CHLORIDE 0.9 % IV SOLN: 2.0000 g | Freq: Two times a day (BID) | INTRAVENOUS | Status: DC

## 2021-03-23 MED ORDER — VANCOMYCIN HCL 500 MG IV SOLR
500.0000 mg | Freq: Once | INTRAVENOUS | Status: AC
  Administered 2021-03-23: 500 mg via INTRAVENOUS
  Filled 2021-03-23: qty 10

## 2021-03-23 MED ORDER — METRONIDAZOLE 500 MG/100ML IV SOLN
500.0000 mg | Freq: Once | INTRAVENOUS | Status: AC
  Administered 2021-03-23: 500 mg via INTRAVENOUS
  Filled 2021-03-23: qty 100

## 2021-03-23 MED ORDER — VANCOMYCIN HCL IN DEXTROSE 1-5 GM/200ML-% IV SOLN
1000.0000 mg | Freq: Once | INTRAVENOUS | Status: AC
  Administered 2021-03-23: 1000 mg via INTRAVENOUS
  Filled 2021-03-23: qty 200

## 2021-03-23 MED ORDER — ACETAMINOPHEN 500 MG PO TABS
1000.0000 mg | ORAL_TABLET | Freq: Once | ORAL | Status: AC
  Administered 2021-03-23: 1000 mg via ORAL
  Filled 2021-03-23: qty 2

## 2021-03-23 MED ORDER — ONDANSETRON HCL 4 MG/2ML IJ SOLN
4.0000 mg | Freq: Once | INTRAMUSCULAR | Status: AC
  Administered 2021-03-23: 4 mg via INTRAVENOUS
  Filled 2021-03-23: qty 2

## 2021-03-23 MED ORDER — LACTATED RINGERS IV BOLUS (SEPSIS)
800.0000 mL | Freq: Once | INTRAVENOUS | Status: AC
  Administered 2021-03-23: 800 mL via INTRAVENOUS

## 2021-03-23 MED ORDER — LACTATED RINGERS IV SOLN: INTRAVENOUS | Status: DC

## 2021-03-23 MED ORDER — SODIUM CHLORIDE 0.9 % IV BOLUS
1000.0000 mL | Freq: Once | INTRAVENOUS | Status: AC
  Administered 2021-03-23: 1000 mL via INTRAVENOUS

## 2021-03-23 MED ORDER — VANCOMYCIN HCL IN DEXTROSE 1-5 GM/200ML-% IV SOLN: 1000.0000 mg | INTRAVENOUS | Status: DC

## 2021-03-23 MED ORDER — SODIUM CHLORIDE 0.9 % IV SOLN: INTRAVENOUS | Status: DC | PRN

## 2021-03-23 MED ORDER — SODIUM CHLORIDE 0.9 % IV SOLN: 2.0000 g | Freq: Once | INTRAVENOUS | Status: DC

## 2021-03-23 NOTE — Progress Notes (Signed)
Pharmacy Antibiotic Note ? ?[name] is a 56 y.o. female admitted on 03/23/2021 presenting with swelling on right side of head/neck/shoulder.  Pharmacy has been consulted for vancomycin and cefepime dosing. ? ?Plan: ?Vancomycin '1500mg'$  IV x 1, then 1000 mg IV q 24h (eAUC 450, Goal AUC 400-550, SCr 1.12) ?Cefepime 2g IV q 12 hours ?Monitor renal function, Cx and clinical progression to narrow ?Vancomycin levels as needed ? ?Height: '5\' 6"'$  (167.6 cm) ?Weight: 65.3 kg (143 lb 15.4 oz) ?IBW/kg (Calculated) : 59.3 ? ?Temp (24hrs), Avg:100.5 ?F (38.1 ?C), Min:98.4 ?F (36.9 ?C), Max:101.8 ?F (38.8 ?C) ? ?Recent Labs  ?Lab 03/23/21 ?1631 03/23/21 ?1840  ?WBC 11.7*  --   ?CREATININE 1.12*  --   ?LATICACIDVEN  --  3.6*  ?  ?Estimated Creatinine Clearance: 51.9 mL/min (A) (by C-G formula based on SCr of 1.12 mg/dL (H)).   ? ?Allergies  ?Allergen Reactions  ? Amoxicillin Hives  ? Aspirin Hives  ? Codeine Hives  ? Penicillins Hives  ?  Has patient had a PCN reaction causing immediate rash, facial/tongue/throat swelling, SOB or lightheadedness with hypotension: Yes ?Has patient had a PCN reaction causing severe rash involving mucus membranes or skin necrosis: No ?Has patient had a PCN reaction that required hospitalization No ?Has patient had a PCN reaction occurring within the last 10 years: Yes ?If all of the above answers are "NO", then may proceed with Cephalosporin use. ?  ? ? ?Bertis Ruddy, PharmD ?Clinical Pharmacist ?ED Pharmacist Phone # [phone] ?03/23/2021 8:30 PM ? ? ?

## 2021-03-23 NOTE — Sepsis Progress Note (Signed)
Following for sepsis monitoring ?

## 2021-03-23 NOTE — Progress Notes (Signed)
Plan of Care Note for accepted transfer ? ? ?Patient: [name] MRN: [id number]   DOA: 03/23/2021 ? ?Facility requesting transfer: [facility] ED ?Requesting Provider: Dr. Alvin Critchley Horton ?Reason for transfer: Severe sepsis, hyperglycemia ?Facility course:  ?Patient is a 56 year old female with a past medical history of hypertension, diabetes, CVA, DVT/PE.  She is noncompliant with her home medications including Eliquis and metformin.  Presenting with complaints of subjective right-sided head/neck/shoulder swelling, not appreciated on exam.  Also endorsed abdominal pain and vomiting.  In the ED, found to be febrile and tachycardic.  WBC 11.7, lactate 3.6 > 2.4.  Blood cultures drawn.  COVID and flu negative.  UA not strongly suggestive of infection.  CT angiogram chest negative for PE or pneumonia.  CT abdomen pelvis showing enlarging left adrenal mass with extension into the to the upper pole of the left kidney, MRI needed for further evaluation.  Glucose 526, bicarb 20, anion gap 19, VBG with normal pH.  Patient was given broad-spectrum antibiotics and IV fluids.  Repeat blood glucose check pending.  Also requested repeat BMP and beta hydroxybutyrate acid, may need insulin drip for early DKA. ? ?Plan of care: ?The patient is accepted for admission to [facility] unit, at [facility]. ? ?Author: ?Shela Leff, MD ?03/23/2021 ? ?Check [email or web address] for on-call coverage. ? ?Nursing staff, Please call [place] number on Amion as soon as patient's arrival, so appropriate admitting provider can evaluate the pt. ?

## 2021-03-23 NOTE — ED Provider Notes (Signed)
?[facility] EMERGENCY DEPARTMENT ?Provider Note ? ? ?CSN: [id number] ?Arrival date & time: 03/23/21  1528 ? ?  ? ?History ? ?No chief complaint on file. ? ? ?[name] is a 56 y.o. female. ? ?HPI ? ?56 year old female with past medical history of HTN, HLD, DM, previous DVT/PE not compliant with her Eliquis presents emergency department with complaint of "swelling of the right side of her head and neck/shoulder.  Patient states that the area has been physically swollen.  Denies any redness or rash.  States has been going on for couple days.  [name] feels as if it is swollen right now.  No numbness.  Patient endorses head congestion, cough, nausea.  Patient feels as if [name] has been infected with COVID.  Patient states that her Eliquis ran out "a couple months ago" and [name] is unclear if [name] is been compliant with her other medications.  [name] is a difficult/poor historian. ? ?Home Medications ?Prior to Admission medications   ?Medication Sig Start Date End Date Taking? Authorizing Provider  ?ALPRAZolam (XANAX) 1 MG tablet Take 1 mg by mouth at bedtime. 02/20/19   [provider]  ?apixaban (ELIQUIS) 5 MG TABS tablet Take 1 tablet (5 mg total) by mouth 2 (two) times daily. 11/18/19   Little, Wenda Overland, MD  ?clopidogrel (PLAVIX) 75 MG tablet Take 1 tablet (75 mg total) by mouth daily. 11/18/19   Little, Wenda Overland, MD  ?diphenhydramine-acetaminophen (TYLENOL PM) 25-500 MG TABS tablet Take 2 tablets by mouth at bedtime as needed (sleep).    [provider]  ?FARXIGA 10 MG TABS tablet Take 10 mg by mouth daily. 03/14/16   [provider]  ?fenofibrate 160 MG tablet Take 1 tablet (160 mg total) by mouth daily. ?Patient not taking: Reported on 03/04/2019 07/31/13   [name], MD  ?fenofibrate 54 MG tablet Take 54 mg by mouth daily. 01/26/19   [provider]  ?glimepiride (AMARYL) 2 MG tablet Take 1 tablet (2 mg total) by mouth every morning. 11/18/19   Little, Wenda Overland, MD  ?HYDROcodone-acetaminophen (NORCO/VICODIN) 5-325 MG tablet Take 1 tablet by mouth every 4 (four) hours as needed. 10/27/20   Tacy Learn, PA-C  ?lisinopril (ZESTRIL) 20 MG tablet Take 1 tablet (20 mg total) by mouth daily. 11/18/19 12/18/19  Little, Wenda Overland, MD  ?meloxicam (MOBIC) 7.5 MG tablet Take 1 tablet (7.5 mg total) by mouth daily. ?Patient not taking: Reported on 03/04/2019 01/17/19   Mesner, Corene Cornea, MD  ?metFORMIN (GLUCOPHAGE-XR) 500 MG 24 hr tablet Take 1 tablet (500 mg total) by mouth daily with breakfast. 11/18/19   Little, Wenda Overland, MD  ?methocarbamol (ROBAXIN) 500 MG tablet Take 1 tablet (500 mg total) by mouth every 8 (eight) hours as needed for muscle spasms. 10/27/20   Tacy Learn, PA-C  ?ondansetron (ZOFRAN) 4 MG tablet Take 1 tablet (4 mg total) by mouth every 6 (six) hours as needed for nausea or vomiting. 09/07/18   Cardama, Grayce Sessions, MD  ?PARoxetine (PAXIL) 40 MG tablet Take 40 mg by mouth every morning.    [provider]  ?pravastatin (PRAVACHOL) 20 MG tablet Take 20 mg by mouth daily.  03/08/14   [provider]  ?TRULICITY 7.74 JO/8.7OM SOPN Inject 0.75 mg into the skin every Monday. 01/26/19   [provider]  ?   ? ?Allergies    ?Amoxicillin, Aspirin, Codeine, and Penicillins   ? ?Review of Systems   ?Review of Systems  ?Constitutional:  Positive for fatigue. Negative for fever.  ?HENT:  Positive for congestion.   ?Respiratory:  Positive for cough, chest tightness and shortness of breath.   ?Cardiovascular:  Positive for palpitations and leg swelling. Negative for chest pain.  ?Gastrointestinal:  Positive for nausea. Negative for abdominal pain, diarrhea and vomiting.  ?Musculoskeletal:  Positive for back pain.  ?Skin:  Negative for rash.  ?Neurological:  Positive for headaches.  ? ?Physical Exam ?Updated Vital Signs ?BP (!) 134/103   Pulse (!) 126   Temp 98.4 ?F (36.9 ?C) (Oral)   Resp (!) 22   Ht '5\' 6"'$  (1.676 m)   Wt 65.3 kg    LMP  (LMP Unknown) Comment: "a few months"  SpO2 98%   BMI 23.24 kg/m?  ?Physical Exam ?Vitals and nursing note reviewed.  ?Constitutional:   ?   Appearance: Normal appearance. [name] is ill-appearing. [name] is not diaphoretic.  ?   Comments: No objective swelling to the right side of the head/face, neck or shoulder  ?HENT:  ?   Head: Normocephalic.  ?   Mouth/Throat:  ?   Mouth: Mucous membranes are moist.  ?Eyes:  ?   Pupils: Pupils are equal, round, and reactive to light.  ?Cardiovascular:  ?   Rate and Rhythm: Tachycardia present.  ?Pulmonary:  ?   Effort: Pulmonary effort is normal. No respiratory distress.  ?   Breath sounds: No rales.  ?Abdominal:  ?   Palpations: Abdomen is soft.  ?   Tenderness: There is no abdominal tenderness.  ?Skin: ?   General: Skin is warm.  ?   Findings: No rash.  ?Neurological:  ?   Mental Status: [name] is alert and oriented to person, place, and time. Mental status is at baseline.  ?Psychiatric:     ?   Mood and Affect: Mood normal.  ? ? ?ED Results / Procedures / Treatments   ?Labs ?(all labs ordered are listed, but only abnormal results are displayed) ?Labs Reviewed  ?CBC - Abnormal; Notable for the following components:  ?    Result Value  ? WBC 11.7 (*)   ? Hemoglobin 15.9 (*)   ? HCT 46.3 (*)   ? Platelets 137 (*)   ? All other components within normal limits  ?COMPREHENSIVE METABOLIC PANEL - Abnormal; Notable for the following components:  ? Sodium 129 (*)   ? Chloride 90 (*)   ? CO2 20 (*)   ? Glucose, Bld 526 (*)   ? Creatinine, Ser 1.12 (*)   ? Albumin 3.4 (*)   ? GFR, Estimated 57 (*)   ? Anion gap 19 (*)   ? All other components within normal limits  ?BRAIN NATRIURETIC PEPTIDE - Abnormal; Notable for the following components:  ? B Natriuretic Peptide 139.2 (*)   ? All other components within normal limits  ?I-STAT VENOUS BLOOD GAS, ED - Abnormal; Notable for the following components:  ? pCO2, Ven 42.0 (*)   ? pO2, Ven 16 (*)   ? Sodium 131 (*)   ? Potassium 3.4 (*)   ? HCT  47.0 (*)   ? Hemoglobin 16.0 (*)   ? All other components within normal limits  ?RESP PANEL BY RT-PCR (FLU A&B, COVID) ARPGX2  ?URINALYSIS, ROUTINE W REFLEX MICROSCOPIC  ?BLOOD GAS, VENOUS  ?TROPONIN I (HIGH SENSITIVITY)  ?TROPONIN I (HIGH SENSITIVITY)  ? ? ?EKG ?EKG Interpretation ? ?Date/Time:  Thursday March 23 2021 16:02:41 EDT ?Ventricular Rate:  129 ?PR Interval:  103 ?QRS  Duration: 80 ?QT Interval:  315 ?QTC Calculation: 462 ?R Axis:   -31 ?Text Interpretation: Sinus tachycardia Ventricular premature complex Consider right atrial enlargement Inferolateral infarct, old Confirmed by Lavenia Atlas [phone]) on 03/23/2021 5:25:46 PM ? ?Radiology ?DG Chest Port 1 View ? ?Result Date: 03/23/2021 ?CLINICAL DATA:  Shortness of breath. EXAM: PORTABLE CHEST 1 VIEW COMPARISON:  AP chest 07/24/2019 FINDINGS: Cardiac silhouette and mediastinal contours are within normal limits with mild calcification within aortic arch. Cardiac loop recorder again overlies the left hemithorax. Flattening of the diaphragms and moderate hyperinflation with increased lucencies again suggesting chronic emphysematous change. No focal airspace opacity to indicate pneumonia. No pulmonary edema, pleural effusion or pneumothorax. Mild multilevel degenerative disc changes of the thoracic spine. IMPRESSION: No significant change from prior.  COPD.  No acute lung process. Electronically Signed   By: Yvonne Kendall M.D.   On: 03/23/2021 17:15   ? ?Procedures ?Marland KitchenCritical Care ?Performed by: Lorelle Gibbs, DO ?Authorized by: Lorelle Gibbs, DO  ? ?Critical care provider statement:  ?  Critical care time (minutes):  60 ?  Critical care time was exclusive of:  Separately billable procedures and treating other patients ?  Critical care was necessary to treat or prevent imminent or life-threatening deterioration of the following conditions:  Sepsis and metabolic crisis ?  Critical care was time spent personally by me on the following activities:   Development of treatment plan with patient or surrogate, discussions with consultants, evaluation of patient's response to treatment, examination of patient, ordering and review of laboratory studies, ordering and review

## 2021-03-23 NOTE — ED Triage Notes (Signed)
States the right side of her head and neck have been swollen for a couple of days. No injury. Pt is alert and oriented. States all her glands are swollen.  ?

## 2021-03-24 ENCOUNTER — Other Ambulatory Visit (HOSPITAL_COMMUNITY): Payer: Self-pay

## 2021-03-24 ENCOUNTER — Inpatient Hospital Stay (HOSPITAL_COMMUNITY): Payer: Self-pay

## 2021-03-24 ENCOUNTER — Encounter (HOSPITAL_COMMUNITY): Payer: Self-pay | Admitting: Internal Medicine

## 2021-03-24 DIAGNOSIS — R6510 Systemic inflammatory response syndrome (SIRS) of non-infectious origin without acute organ dysfunction: Principal | ICD-10-CM | POA: Diagnosis present

## 2021-03-24 DIAGNOSIS — I10 Essential (primary) hypertension: Secondary | ICD-10-CM

## 2021-03-24 DIAGNOSIS — R112 Nausea with vomiting, unspecified: Secondary | ICD-10-CM

## 2021-03-24 DIAGNOSIS — I2699 Other pulmonary embolism without acute cor pulmonale: Secondary | ICD-10-CM

## 2021-03-24 DIAGNOSIS — R197 Diarrhea, unspecified: Secondary | ICD-10-CM

## 2021-03-24 DIAGNOSIS — R079 Chest pain, unspecified: Secondary | ICD-10-CM

## 2021-03-24 DIAGNOSIS — E1165 Type 2 diabetes mellitus with hyperglycemia: Secondary | ICD-10-CM | POA: Diagnosis present

## 2021-03-24 HISTORY — DX: Nausea with vomiting, unspecified: R19.7

## 2021-03-24 HISTORY — DX: Nausea with vomiting, unspecified: R11.2

## 2021-03-24 LAB — BASIC METABOLIC PANEL
Anion gap: 16 — ABNORMAL HIGH (ref 5–15)
Anion gap: 15 (ref 5–15)
Anion gap: 12 (ref 5–15)
BUN: 14 mg/dL (ref 6–20)
BUN: 14 mg/dL (ref 6–20)
BUN: 13 mg/dL (ref 6–20)
CO2: 14 mmol/L — ABNORMAL LOW (ref 22–32)
CO2: 15 mmol/L — ABNORMAL LOW (ref 22–32)
CO2: 18 mmol/L — ABNORMAL LOW (ref 22–32)
Calcium: 8.6 mg/dL — ABNORMAL LOW (ref 8.9–10.3)
Calcium: 8.4 mg/dL — ABNORMAL LOW (ref 8.9–10.3)
Calcium: 8.1 mg/dL — ABNORMAL LOW (ref 8.9–10.3)
Chloride: 99 mmol/L (ref 98–111)
Chloride: 101 mmol/L (ref 98–111)
Chloride: 98 mmol/L (ref 98–111)
Creatinine, Ser: 0.89 mg/dL (ref 0.44–1.00)
Creatinine, Ser: 0.86 mg/dL (ref 0.44–1.00)
Creatinine, Ser: 0.78 mg/dL (ref 0.44–1.00)
GFR, Estimated: >60 mL/min (ref >60)
GFR, Estimated: >60 mL/min (ref >60)
GFR, Estimated: >60 mL/min (ref >60)
Glucose, Bld: 370 mg/dL — ABNORMAL HIGH (ref 70–99)
Glucose, Bld: 294 mg/dL — ABNORMAL HIGH (ref 70–99)
Glucose, Bld: 166 mg/dL — ABNORMAL HIGH (ref 70–99)
Potassium: 3.6 mmol/L (ref 3.5–5.1)
Potassium: 3.4 mmol/L — ABNORMAL LOW (ref 3.5–5.1)
Potassium: 3.6 mmol/L (ref 3.5–5.1)
Sodium: 129 mmol/L — ABNORMAL LOW (ref 135–145)
Sodium: 131 mmol/L — ABNORMAL LOW (ref 135–145)
Sodium: 128 mmol/L — ABNORMAL LOW (ref 135–145)

## 2021-03-24 LAB — BLOOD CULTURE ID PANEL (REFLEXED) - BCID2

## 2021-03-24 LAB — CBC WITH DIFFERENTIAL/PLATELET
Abs Immature Granulocytes: 0.11 10*3/uL — ABNORMAL HIGH (ref 0.00–0.07)
Basophils Absolute: 0 10*3/uL (ref 0.0–0.1)
Basophils Relative: 0 %
Eosinophils Absolute: 0 10*3/uL (ref 0.0–0.5)
Eosinophils Relative: 0 %
HCT: 41.7 % (ref 36.0–46.0)
Hemoglobin: 13.7 g/dL (ref 12.0–15.0)
Immature Granulocytes: 1 %
Lymphocytes Relative: 8 %
Lymphs Abs: 1.3 10*3/uL (ref 0.7–4.0)
MCH: 32.1 pg (ref 26.0–34.0)
MCHC: 32.9 g/dL (ref 30.0–36.0)
MCV: 97.7 fL (ref 80.0–100.0)
Monocytes Absolute: 0.7 10*3/uL (ref 0.1–1.0)
Monocytes Relative: 5 %
Neutro Abs: 13.5 10*3/uL — ABNORMAL HIGH (ref 1.7–7.7)
Neutrophils Relative %: 86 %
Platelets: UNDETERMINED 10*3/uL (ref 150–400)
RBC: 4.27 MIL/uL (ref 3.87–5.11)
RDW: 14.1 % (ref 11.5–15.5)
WBC: 15.6 10*3/uL — ABNORMAL HIGH (ref 4.0–10.5)
nRBC: 0 % (ref 0.0–0.2)

## 2021-03-24 LAB — ECHOCARDIOGRAM COMPLETE
AR max vel: 2.3 cm2
AV Area VTI: 2.15 cm2
AV Area mean vel: 2.23 cm2
AV Mean grad: 8 mmHg
AV Peak grad: 15.1 mmHg
Ao pk vel: 1.94 m/s
Area-P 1/2: 4.89 cm2
Height: 66 in
MV VTI: 1.68 cm2
S' Lateral: 1.9 cm
Weight: 2059.98 oz

## 2021-03-24 LAB — GLUCOSE, CAPILLARY
Glucose-Capillary: 385 mg/dL — ABNORMAL HIGH (ref 70–99)
Glucose-Capillary: 264 mg/dL — ABNORMAL HIGH (ref 70–99)
Glucose-Capillary: 188 mg/dL — ABNORMAL HIGH (ref 70–99)
Glucose-Capillary: 187 mg/dL — ABNORMAL HIGH (ref 70–99)
Glucose-Capillary: 130 mg/dL — ABNORMAL HIGH (ref 70–99)
Glucose-Capillary: 157 mg/dL — ABNORMAL HIGH (ref 70–99)
Glucose-Capillary: 182 mg/dL — ABNORMAL HIGH (ref 70–99)
Glucose-Capillary: 167 mg/dL — ABNORMAL HIGH (ref 70–99)
Glucose-Capillary: 172 mg/dL — ABNORMAL HIGH (ref 70–99)
Glucose-Capillary: 160 mg/dL — ABNORMAL HIGH (ref 70–99)

## 2021-03-24 LAB — HEPATIC FUNCTION PANEL
ALT: 27 U/L (ref 0–44)
AST: 26 U/L (ref 15–41)
Albumin: 2.9 g/dL — ABNORMAL LOW (ref 3.5–5.0)
Alkaline Phosphatase: 78 U/L (ref 38–126)
Bilirubin, Direct: 0.1 mg/dL (ref 0.0–0.2)
Indirect Bilirubin: 0.6 mg/dL (ref 0.3–0.9)
Total Bilirubin: 0.7 mg/dL (ref 0.3–1.2)
Total Protein: 6.3 g/dL — ABNORMAL LOW (ref 6.5–8.1)

## 2021-03-24 LAB — TROPONIN I (HIGH SENSITIVITY)
Troponin I (High Sensitivity): 30 ng/L — ABNORMAL HIGH (ref <18)
Troponin I (High Sensitivity): 37 ng/L — ABNORMAL HIGH (ref <18)

## 2021-03-24 LAB — CBG MONITORING, ED: Glucose-Capillary: 367 mg/dL — ABNORMAL HIGH (ref 70–99)

## 2021-03-24 LAB — BLOOD GAS, ARTERIAL
Acid-base deficit: 6 mmol/L — ABNORMAL HIGH (ref 0.0–2.0)
Bicarbonate: 16.9 mmol/L — ABNORMAL LOW (ref 20.0–28.0)
O2 Saturation: 96.7 %
Patient temperature: 37
pCO2 arterial: 26 mmHg — ABNORMAL LOW (ref 32–48)
pH, Arterial: 7.42 (ref 7.35–7.45)
pO2, Arterial: 64 mmHg — ABNORMAL LOW (ref 83–108)

## 2021-03-24 LAB — TSH: TSH: 0.374 u[IU]/mL (ref 0.350–4.500)

## 2021-03-24 LAB — HIV ANTIBODY (ROUTINE TESTING W REFLEX): HIV Screen 4th Generation wRfx: NONREACTIVE

## 2021-03-24 LAB — LACTIC ACID, PLASMA
Lactic Acid, Venous: 1.5 mmol/L (ref 0.5–1.9)
Lactic Acid, Venous: 1.2 mmol/L (ref 0.5–1.9)

## 2021-03-24 MED ORDER — INSULIN ASPART 100 UNIT/ML IJ SOLN: 0.0000 [IU] | Freq: Three times a day (TID) | INTRAMUSCULAR | Status: DC

## 2021-03-24 MED ORDER — INSULIN REGULAR(HUMAN) IN NACL 100-0.9 UT/100ML-% IV SOLN
INTRAVENOUS | Status: DC
  Administered 2021-03-24: 4.6 [IU]/h via INTRAVENOUS
  Filled 2021-03-24: qty 100

## 2021-03-24 MED ORDER — DEXTROSE IN LACTATED RINGERS 5 % IV SOLN: INTRAVENOUS | Status: DC

## 2021-03-24 MED ORDER — ACETAMINOPHEN 650 MG RE SUPP: 650.0000 mg | Freq: Four times a day (QID) | RECTAL | Status: DC | PRN

## 2021-03-24 MED ORDER — CEFTRIAXONE SODIUM 2 G IJ SOLR
2.0000 g | INTRAMUSCULAR | Status: DC
  Filled 2021-03-24: qty 20

## 2021-03-24 MED ORDER — LACTATED RINGERS IV BOLUS
1000.0000 mL | Freq: Once | INTRAVENOUS | Status: AC
  Administered 2021-03-24: 1000 mL via INTRAVENOUS

## 2021-03-24 MED ORDER — FENOFIBRATE 160 MG PO TABS
160.0000 mg | ORAL_TABLET | Freq: Every day | ORAL | Status: DC
  Administered 2021-03-24: 160 mg via ORAL
  Filled 2021-03-24: qty 1

## 2021-03-24 MED ORDER — LACTATED RINGERS IV SOLN: INTRAVENOUS | Status: DC

## 2021-03-24 MED ORDER — PRAVASTATIN SODIUM 20 MG PO TABS
20.0000 mg | ORAL_TABLET | Freq: Every day | ORAL | Status: DC
  Administered 2021-03-24: 20 mg via ORAL
  Filled 2021-03-24: qty 1

## 2021-03-24 MED ORDER — INSULIN DETEMIR 100 UNIT/ML ~~LOC~~ SOLN
12.0000 [IU] | Freq: Every day | SUBCUTANEOUS | Status: DC
  Administered 2021-03-24 – 2021-03-26 (×3): 12 [IU] via SUBCUTANEOUS
  Filled 2021-03-24 (×3): qty 0.12

## 2021-03-24 MED ORDER — DEXTROSE 50 % IV SOLN: 0.0000 mL | INTRAVENOUS | Status: DC | PRN

## 2021-03-24 MED ORDER — ACETAMINOPHEN 325 MG PO TABS
650.0000 mg | ORAL_TABLET | Freq: Four times a day (QID) | ORAL | Status: DC | PRN
  Administered 2021-03-24 – 2021-03-28 (×8): 650 mg via ORAL
  Filled 2021-03-24 (×8): qty 2

## 2021-03-24 MED ORDER — ENOXAPARIN SODIUM 60 MG/0.6ML IJ SOSY
60.0000 mg | PREFILLED_SYRINGE | Freq: Two times a day (BID) | INTRAMUSCULAR | Status: DC
  Administered 2021-03-24 – 2021-03-27 (×6): 60 mg via SUBCUTANEOUS
  Filled 2021-03-24 (×7): qty 0.6

## 2021-03-24 MED ORDER — LEVALBUTEROL HCL 0.63 MG/3ML IN NEBU
0.6300 mg | INHALATION_SOLUTION | Freq: Four times a day (QID) | RESPIRATORY_TRACT | Status: DC
  Administered 2021-03-24 (×2): 0.63 mg via RESPIRATORY_TRACT
  Filled 2021-03-24 (×2): qty 3

## 2021-03-24 MED ORDER — METRONIDAZOLE 500 MG PO TABS
500.0000 mg | ORAL_TABLET | Freq: Two times a day (BID) | ORAL | Status: DC
  Administered 2021-03-24 – 2021-03-25 (×3): 500 mg via ORAL
  Filled 2021-03-24 (×3): qty 1

## 2021-03-24 MED ORDER — POTASSIUM CHLORIDE 10 MEQ/100ML IV SOLN
10.0000 meq | INTRAVENOUS | Status: AC
  Administered 2021-03-24 (×2): 10 meq via INTRAVENOUS
  Filled 2021-03-24 (×2): qty 100

## 2021-03-24 MED ORDER — FUROSEMIDE 10 MG/ML IJ SOLN
40.0000 mg | Freq: Once | INTRAMUSCULAR | Status: AC
  Administered 2021-03-24: 20 mg via INTRAVENOUS
  Filled 2021-03-24: qty 4

## 2021-03-24 MED ORDER — INSULIN ASPART 100 UNIT/ML IJ SOLN
0.0000 [IU] | INTRAMUSCULAR | Status: DC
  Administered 2021-03-24: 1 [IU] via SUBCUTANEOUS
  Administered 2021-03-25: 2 [IU] via SUBCUTANEOUS
  Administered 2021-03-25: 1 [IU] via SUBCUTANEOUS
  Administered 2021-03-25 – 2021-03-26 (×4): 2 [IU] via SUBCUTANEOUS
  Administered 2021-03-26: 5 [IU] via SUBCUTANEOUS
  Administered 2021-03-26 – 2021-03-27 (×3): 2 [IU] via SUBCUTANEOUS
  Administered 2021-03-27: 3 [IU] via SUBCUTANEOUS
  Administered 2021-03-27: 2 [IU] via SUBCUTANEOUS

## 2021-03-24 MED ORDER — LEVALBUTEROL HCL 0.63 MG/3ML IN NEBU
0.6300 mg | INHALATION_SOLUTION | Freq: Two times a day (BID) | RESPIRATORY_TRACT | Status: DC
  Administered 2021-03-25 – 2021-03-28 (×7): 0.63 mg via RESPIRATORY_TRACT
  Filled 2021-03-24 (×7): qty 3

## 2021-03-24 MED ORDER — APIXABAN 5 MG PO TABS
5.0000 mg | ORAL_TABLET | Freq: Two times a day (BID) | ORAL | Status: DC
  Administered 2021-03-24: 5 mg via ORAL
  Filled 2021-03-24: qty 1

## 2021-03-24 MED ORDER — TRAMADOL HCL 50 MG PO TABS
50.0000 mg | ORAL_TABLET | Freq: Once | ORAL | Status: AC
  Administered 2021-03-24: 50 mg via ORAL
  Filled 2021-03-24: qty 1

## 2021-03-24 MED ORDER — MELATONIN 5 MG PO TABS
5.0000 mg | ORAL_TABLET | Freq: Once | ORAL | Status: AC
  Administered 2021-03-24: 5 mg via ORAL
  Filled 2021-03-24: qty 1

## 2021-03-24 MED ORDER — SODIUM CHLORIDE 0.9 % IV SOLN: 2.0000 g | Freq: Three times a day (TID) | INTRAVENOUS | Status: DC

## 2021-03-24 MED ORDER — CHLORHEXIDINE GLUCONATE CLOTH 2 % EX PADS
6.0000 | MEDICATED_PAD | Freq: Every day | CUTANEOUS | Status: DC
  Administered 2021-03-24 – 2021-03-27 (×5): 6 via TOPICAL
  Filled 2021-03-24: qty 6

## 2021-03-24 MED ORDER — CLOPIDOGREL BISULFATE 75 MG PO TABS
75.0000 mg | ORAL_TABLET | Freq: Every day | ORAL | Status: DC
  Administered 2021-03-24 – 2021-03-28 (×5): 75 mg via ORAL
  Filled 2021-03-24 (×5): qty 1

## 2021-03-24 MED ORDER — SODIUM CHLORIDE 0.9 % IV SOLN
2.0000 g | Freq: Three times a day (TID) | INTRAVENOUS | Status: DC
  Administered 2021-03-24 – 2021-03-27 (×9): 2 g via INTRAVENOUS
  Filled 2021-03-24 (×9): qty 2

## 2021-03-24 MED ORDER — BUDESONIDE 0.5 MG/2ML IN SUSP
0.5000 mg | Freq: Two times a day (BID) | RESPIRATORY_TRACT | Status: DC
  Administered 2021-03-24 – 2021-03-28 (×8): 0.5 mg via RESPIRATORY_TRACT
  Filled 2021-03-24 (×8): qty 2

## 2021-03-24 NOTE — Progress Notes (Signed)
ANTICOAGULATION CONSULT NOTE - Initial Consult ? ?Pharmacy Consult for enoxaparin ?Indication:  VTE treatment, apixaban on hold ? ?Allergies  ?Allergen Reactions  ? Amoxicillin Hives  ? Aspirin Hives  ? Codeine Hives  ? Penicillins Hives  ?  Has patient had a PCN reaction causing immediate rash, facial/tongue/throat swelling, SOB or lightheadedness with hypotension: Yes ?Has patient had a PCN reaction causing severe rash involving mucus membranes or skin necrosis: No ?Has patient had a PCN reaction that required hospitalization No ?Has patient had a PCN reaction occurring within the last 10 years: Yes ?If all of the above answers are "NO", then may proceed with Cephalosporin use. ?  ? ? ?Patient Measurements: ?Height: '5\' 6"'$  (167.6 cm) ?Weight: 58.4 kg (128 lb 12 oz) ?IBW/kg (Calculated) : 59.3 ? ?Vital Signs: ?Temp: 99.4 ?F (37.4 ?C) (03/17 1800) ?Temp Source: Axillary (03/17 1800) ?BP: 110/69 (03/17 0700) ?Pulse Rate: 133 (03/17 1443) ? ?Labs: ?Recent Labs  ?  03/23/21 ?1631 03/23/21 ?1731 03/23/21 ?1840 03/24/21 ?0304 03/24/21 ?4076 03/24/21 ?8088 03/24/21 ?1117  ?HGB 15.9* 16.0*  --  13.7  --   --   --   ?HCT 46.3* 47.0*  --  41.7  --   --   --   ?PLT 137*  --   --  PLATELET CLUMPS NOTED ON SMEAR, UNABLE TO ESTIMATE  --   --   --   ?CREATININE 1.12*  --   --  0.89  --  0.86 0.78  ?TROPONINIHS 14  --  19* 30* 37*  --   --   ? ? ?Estimated Creatinine Clearance: 71.5 mL/min (by C-G formula based on SCr of 0.78 mg/dL). ? ? ?Medical History: ?Past Medical History:  ?Diagnosis Date  ? Acid reflux   ? Adrenal benign tumor   ? Apparently enlarging and patient "is supposed to have it taken out"  ? Anxiety   ? Depression   ? Diabetes mellitus without complication ([place])   ? DVT (deep vein thrombosis) in pregnancy   ? Hypertension   ? PE (pulmonary embolism)   ? Stroke [facility])   ? ? ?Medications: Pt prescribed apixaban 5 mg PO BID PTA, but is not complaint ? ?Assessment: ?Pt is a 21 yoF with PMH significant for PE, prescribed  apixaban PTA. Pt has not been compliant with medication recently, but was resumed on admission - last dose given this morning. Pharmacy has  been consulted for therapeutic enoxaparin dosing.  ? ?Last dose of apixaban given 3/17 @ 0547 ? ?Today, 03/24/21 ?CBC: Hgb WNL, Plt slightly low ?SCr 0.78 - WNL. CrCl ~70 mL/min ? ?Goal of Therapy:  ?Monitor platelets by anticoagulation protocol: Yes ?  ?Plan:  ?Enoxaparin 1 mg/kg (60 mg) subcutaneously q12h ?Monitor CBC, renal function ? ?Lenis Noon, PharmD ?03/24/2021,6:54 PM ?

## 2021-03-24 NOTE — Assessment & Plan Note (Signed)
DDX broad-UA performed but no urine culture unfortunately ?Received 2.8 L bolus ?Continue saline 200 cc/H ?Changed to ceftriaxone Flagyl IV-follow GI pathogen panel and obtain C. difficile if patient has very profuse [place] 7 diarrhea ?Monitor pulse rate etc.-this is likely secondary to sepsis physiology ? ?

## 2021-03-24 NOTE — Progress Notes (Signed)
Clinical update by nursing staff ?More labored WOB more DOE--at bedside she appears more ill than this morning ?On exam distant lung sounds slight rales posterolaterally right side-no egophony no fremitus ?She is still tachycardic ? ?DDx mild volume overload versus impending worsening sepsis--patient growing 4/4 bottles Enterobacter ?I think the patient is acidotic secondary to both sepsis + as well as DKA ?Personally reviewed the CXR-no focal consolidation some possible hilar lucency ?I will do a Bmet stat to see if I can transition her fluids downwards fluids (has DKA) ? ?Requested critical care to kindly curbside ?She has confirmed to me she is DNR and I have spoken in detail with her son at the bedside ? ?Verneita Griffes, MD ?[facility] ?11:14 AM ? ?

## 2021-03-24 NOTE — Progress Notes (Signed)
RT NOTE: ? ?Pt placed on BiPAP per MD order. Pt states she will try to wear the BiPAP for a couple of hours. RT explained the importance of the BiPAP machine to assist pt with her increased WOB and pt showed understanding. Vitals are stable at this time, RN is aware. RT will continue to monitor pt status.  ?

## 2021-03-24 NOTE — H&P (Signed)
History and Physical    [name] DOB: February 27, 1965 DOA: 03/23/2021  PCP: Adrienne Mocha, PA (Inactive)  Patient coming from: Home.  Chief Complaint: Nausea vomiting abdominal discomfort and right-sided swelling.  HPI: [name] is a 56 y.o. female with history of stroke, DVT/PE, diabetes mellitus, adrenal mass presents to the ER at med center with complaints of having noticed increasing swelling on the right side of the neck and facial area.  Patient also has been having nausea vomiting abdominal discomfort for the week.  Patient has been also has been having similar symptoms.  Patient also had episodes of diarrhea.  Denies any recent travel or eating any abnormal foods or uncooked food or seafood.  Given the worsening symptoms patient presents to the ER.  Patient states she has not taken any of her medications for last 2 months.  ED Course: In the ER patient was tachycardic febrile with temperature of 101.8 F labs show leukocytosis of 11.7 lactic acid of 3.6 which improved to 2.4 after hydration.  Blood glucose of 526 with anion gap of 19 sodium of 129 platelets were 137.  COVID test was negative.  CT angiogram of the chest abdomen pelvis did not show anything acute except for the large left atrial mass.  Which will need MRI.  UA is equivocal.  Patient was started on empiric antibiotics for possible developing sepsis unknown source.  In addition patient also had uncontrolled diabetes.  Review of Systems: As per HPI, rest all negative.   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;  Surgeon: Bradly Bienenstock, MD;  Location: [facility] OR;  Service: Orthopedics;  Laterality: Right;  needs 60 min   LOOP RECORDER IMPLANT N/A 03/25/2014   Procedure: LOOP RECORDER IMPLANT;  Surgeon: Duke Salvia, MD;  Location: [facility] CATH LAB;  Service: Cardiovascular;  Laterality: N/A;   NO PAST SURGERIES     TEE WITHOUT CARDIOVERSION N/A 03/25/2014   Procedure: TRANSESOPHAGEAL ECHOCARDIOGRAM (TEE);  Surgeon: Chrystie Nose, MD;  Location: [facility] ENDOSCOPY;  Service: Cardiovascular;  Laterality: N/A;     reports that she has been smoking cigarettes. She has a 60.00 pack-year smoking history. She has never used smokeless tobacco. She reports that she does not drink alcohol and does not use drugs.  Allergies  Allergen Reactions   Amoxicillin Hives   Aspirin Hives   Codeine Hives   Penicillins Hives    Has patient had a PCN reaction causing immediate rash, facial/tongue/throat swelling, SOB or lightheadedness with hypotension: Yes Has patient had a PCN reaction causing severe rash involving mucus membranes or skin necrosis: No Has patient had a PCN reaction that required hospitalization No Has patient had a PCN reaction occurring within the last 10 years: Yes If all of the above answers are "NO", then may proceed with Cephalosporin use.     Family History  Problem Relation Age of Onset   Heart attack Father 9  Prior to Admission medications   Medication Sig Start Date End Date Taking? Authorizing Provider  ALPRAZolam Prudy Feeler) 1 MG tablet Take 1 mg by mouth at bedtime. 02/20/19   [provider]  apixaban (ELIQUIS) 5 MG TABS tablet Take 1 tablet (5 mg total) by mouth 2 (two) times daily. 11/18/19   Little, Ambrose Finland, MD  clopidogrel (PLAVIX) 75 MG tablet Take 1 tablet (75 mg total) by mouth daily. 11/18/19   Little, Ambrose Finland, MD  diphenhydramine-acetaminophen (TYLENOL PM) 25-500 MG  TABS tablet Take 2 tablets by mouth at bedtime as needed (sleep).    [provider]  FARXIGA 10 MG TABS tablet Take 10 mg by mouth daily. 03/14/16   [provider]  fenofibrate 160 MG tablet Take 1 tablet (160 mg total) by mouth daily. Patient not taking: Reported on 03/04/2019 07/31/13   Richarda Overlie, MD  fenofibrate 54 MG tablet Take 54 mg by mouth daily. 01/26/19   [provider]  glimepiride (AMARYL) 2 MG tablet Take 1 tablet (2 mg total) by mouth every morning. 11/18/19   Little, Ambrose Finland, MD  HYDROcodone-acetaminophen (NORCO/VICODIN) 5-325 MG tablet Take 1 tablet by mouth every 4 (four) hours as needed. 10/27/20   Jeannie Fend, PA-C  lisinopril (ZESTRIL) 20 MG tablet Take 1 tablet (20 mg total) by mouth daily. 11/18/19 12/18/19  Little, Ambrose Finland, MD  meloxicam (MOBIC) 7.5 MG tablet Take 1 tablet (7.5 mg total) by mouth daily. Patient not taking: Reported on 03/04/2019 01/17/19   Mesner, Barbara Cower, MD  metFORMIN (GLUCOPHAGE-XR) 500 MG 24 hr tablet Take 1 tablet (500 mg total) by mouth daily with breakfast. 11/18/19   Little, Ambrose Finland, MD  methocarbamol (ROBAXIN) 500 MG tablet Take 1 tablet (500 mg total) by mouth every 8 (eight) hours as needed for muscle spasms. 10/27/20   Jeannie Fend, PA-C  ondansetron (ZOFRAN) 4 MG tablet Take 1 tablet (4 mg total) by mouth every 6 (six) hours as needed for nausea or vomiting. 09/07/18   Cardama, Amadeo Garnet, MD  PARoxetine (PAXIL) 40 MG tablet Take 40 mg by mouth every morning.    [provider]  pravastatin (PRAVACHOL) 20 MG tablet Take 20 mg by mouth daily.  03/08/14   [provider]  TRULICITY 0.75 MG/0.5ML SOPN Inject 0.75 mg into the skin every Monday. 01/26/19   [provider]    Physical Exam: Constitutional: Moderately built and nourished. Vitals:   03/23/21 2300 03/23/21 2330 03/24/21 0000 03/24/21 0134  BP: 127/72 128/80 118/73   Pulse: (!) 130 (!) 133 (!) 131 (!) 124   Resp: (!) 22 (!) 22 20   Temp: 98.2 F (36.8 C)   99 F (37.2 C)  TempSrc: Oral   Oral  SpO2: 95% 95% 95% 95%  Weight:    58.4 kg  Height:    5\' 6"  (1.676 m)   Eyes: Anicteric no pallor. ENMT: No discharge from the ears eyes nose and mouth: Neck: No mass felt.  No neck rigidity. Respiratory: No rhonchi or crepitations. Cardiovascular: S1-S2 heard.   Abdomen: Soft mild diffuse tenderness no guarding or rigidity. Musculoskeletal: No edema. Skin: No rash. Neurologic: Alert awake oriented time place and person.  Moves all extremities. Psychiatric: Appears normal.  Normal affect.   Labs on Admission: I have personally reviewed following labs and imaging studies  CBC: Recent Labs  Lab 03/23/21 1631 03/23/21 1731  WBC 11.7*  --   HGB 15.9* 16.0*  HCT 46.3* 47.0*  MCV 93.0  --   PLT 137*  --    Basic Metabolic Panel: Recent Labs  Lab 03/23/21 1631 03/23/21 1731  NA 129* 131*  K 3.5 3.4*  CL 90*  --   CO2 20*  --   GLUCOSE 526*  --   BUN 17  --   CREATININE 1.12*  --   CALCIUM 9.4  --    GFR: Estimated Creatinine Clearance: 51.1 mL/min (A) (by C-G formula based on SCr of 1.12 mg/dL (H)). Liver Function Tests: Recent Labs  Lab 03/23/21 1631  AST 32  ALT 32  ALKPHOS 96  BILITOT 0.8  PROT 7.4  ALBUMIN 3.4*   No results for input(s): LIPASE, AMYLASE in the last 168 hours. No results for input(s): AMMONIA in the last 168 hours. Coagulation Profile: No results for input(s): INR, PROTIME in the last 168 hours. Cardiac Enzymes: No results for input(s): CKTOTAL, CKMB, CKMBINDEX, TROPONINI in the last 168 hours. BNP (last 3 results) No results for input(s): PROBNP in the last 8760 hours. HbA1C: No results for input(s): HGBA1C in the last 72 hours. CBG: Recent Labs  Lab 03/24/21 0016 03/24/21 0147  GLUCAP 367* 385*   Lipid Profile: No results for input(s): CHOL, HDL, LDLCALC, TRIG, CHOLHDL, LDLDIRECT in the last 72 hours. Thyroid Function Tests: No  results for input(s): TSH, T4TOTAL, FREET4, T3FREE, THYROIDAB in the last 72 hours. Anemia Panel: No results for input(s): VITAMINB12, FOLATE, FERRITIN, TIBC, IRON, RETICCTPCT in the last 72 hours. Urine analysis:    Component Value Date/Time   COLORURINE YELLOW 03/23/2021 1608   APPEARANCEUR CLEAR 03/23/2021 1608   LABSPEC 1.015 03/23/2021 1608   PHURINE 5.0 03/23/2021 1608   GLUCOSEU >=500 (A) 03/23/2021 1608   GLUCOSEU NEGATIVE 05/29/2006 1222   HGBUR TRACE (A) 03/23/2021 1608   BILIRUBINUR NEGATIVE 03/23/2021 1608   KETONESUR 15 (A) 03/23/2021 1608   PROTEINUR NEGATIVE 03/23/2021 1608   UROBILINOGEN 0.2 05/25/2014 1113   NITRITE NEGATIVE 03/23/2021 1608   LEUKOCYTESUR NEGATIVE 03/23/2021 1608   Sepsis Labs: @LABRCNTIP (procalcitonin:4,lacticidven:4) ) Recent Results (from the past 240 hour(s))  Resp Panel by RT-PCR (Flu A&B, Covid) Nasopharyngeal Swab     Status: None   Collection Time: 03/23/21  4:40 PM   Specimen: Nasopharyngeal Swab; Nasopharyngeal(NP) swabs in vial transport medium  Result Value Ref Range Status   SARS Coronavirus 2 by RT PCR NEGATIVE NEGATIVE Final    Comment: (NOTE) SARS-CoV-2 target nucleic acids are NOT DETECTED.  The SARS-CoV-2 RNA is generally detectable in upper respiratory specimens during the acute phase of infection. The lowest concentration of SARS-CoV-2 viral copies this assay can detect is 138 copies/mL. A negative result does not preclude SARS-Cov-2 infection and should not be used as the sole basis for treatment or other patient management decisions. A negative result may occur with  improper specimen collection/handling, submission of specimen other than nasopharyngeal swab, presence of viral mutation(s) within the areas targeted by this assay, and inadequate number of viral copies(<138 copies/mL). A negative result must be combined with clinical observations, patient history, and epidemiological information. The expected result is  Negative.  Fact Sheet for Patients:  BloggerCourse.com  Fact Sheet for Healthcare Providers:  SeriousBroker.it  This test is no t yet approved or cleared by the [place] FDA and  has been authorized for detection and/or diagnosis of SARS-CoV-2 by FDA under an Emergency Use Authorization (EUA). This EUA will remain  in effect (meaning this test can be used) for the duration of the  COVID-19 declaration under Section 564(b)(1) of the Act, 21 U.S.C.section 360bbb-3(b)(1), unless the authorization is terminated  or revoked sooner.       Influenza A by PCR NEGATIVE NEGATIVE Final   Influenza B by PCR NEGATIVE NEGATIVE Final    Comment: (NOTE) The Xpert Xpress SARS-CoV-2/FLU/RSV plus assay is intended as an aid in the diagnosis of influenza from Nasopharyngeal swab specimens and should not be used as a sole basis for treatment. Nasal washings and aspirates are unacceptable for Xpert Xpress SARS-CoV-2/FLU/RSV testing.  Fact Sheet for Patients: BloggerCourse.com  Fact Sheet for Healthcare Providers: SeriousBroker.it  This test is not yet approved or cleared by the [place] FDA and has been authorized for detection and/or diagnosis of SARS-CoV-2 by FDA under an Emergency Use Authorization (EUA). This EUA will remain in effect (meaning this test can be used) for the duration of the COVID-19 declaration under Section 564(b)(1) of the Act, 21 U.S.C. section 360bbb-3(b)(1), unless the authorization is terminated or revoked.  Performed at [facility], [address] [place], [place] [address]      Radiological Exams on Admission: CT Abdomen Pelvis Wo Contrast  Result Date: 03/23/2021 CLINICAL DATA:  Nausea and vomiting for several days. EXAM: CT ABDOMEN AND PELVIS WITHOUT CONTRAST TECHNIQUE: Multidetector CT imaging of the abdomen and pelvis was performed  following the standard protocol without IV contrast. RADIATION DOSE REDUCTION: This exam was performed according to the departmental dose-optimization program which includes automated exposure control, adjustment of the mA and/or kV according to patient size and/or use of iterative reconstruction technique. COMPARISON:  November 18, 2018 FINDINGS: Lower chest: Mild atelectasis and/or infiltrate is seen along the anterior and lateral aspect of the right lung base. Hepatobiliary: No focal liver abnormality is seen. No gallstones, gallbladder wall thickening, or biliary dilatation. Pancreas: Unremarkable. No pancreatic ductal dilatation or surrounding inflammatory changes. Spleen: Normal in size without focal abnormality. Adrenals/Urinary Tract: A 5.7 cm x 4.5 cm heterogeneous, partially calcified left adrenal mass is seen. This measures approximately 5.1 cm x 4.5 cm on the prior study. Since the prior study there has been loss of the fat plane separating the left adrenal gland and upper pole of the left kidney. The right adrenal gland is normal in appearance. Kidneys are normal in size, without renal calculi or hydronephrosis. A stable subcentimeter cyst is seen along the posterior aspect of the lower pole of the right kidney. A 3.8 cm x 2.7 cm heterogeneous mass is seen within the upper pole of the left kidney. This appears to extend from the adjacent left adrenal mass. Subsequent mass effect is seen on the upper pole collecting system, as evident by displacement of the intravenous contrast seen within the upper pole calyx. A large amount of contrast is seen within the bladder lumen. A small amount of intraluminal air is also noted. The bladder wall is mildly thickened and lobulated in appearance with a 13 mm x 12 mm posterolateral urinary bladder diverticulum seen on the right. Stomach/Bowel: Stomach is within normal limits. Appendix appears normal. No evidence of bowel wall thickening, distention, or inflammatory  changes. Vascular/Lymphatic: Aortic atherosclerosis. No enlarged abdominal or pelvic lymph nodes. Reproductive: Uterus and bilateral adnexa are unremarkable. Other: No abdominal wall hernia or abnormality. No abdominopelvic ascites. Musculoskeletal: No acute or significant osseous findings. IMPRESSION: 1. Large, heterogeneous, partially calcified left adrenal mass, which demonstrates enlargement since the prior exam with interval extension into the adjacent portion of the upper pole of the left kidney.  Further correlation with MRI is recommended. 2. Mild atelectasis and/or infiltrate along the anterior and lateral aspect of the right lung base. 3. Aortic atherosclerosis. Aortic Atherosclerosis (ICD10-I70.0). Electronically Signed   By: Aram Candela M.D.   On: 03/23/2021 21:40   CT Angio Chest PE W/Cm &/Or Wo Cm  Result Date: 03/23/2021 CLINICAL DATA:  Suspected pulmonary embolism. EXAM: CT ANGIOGRAPHY CHEST WITH CONTRAST TECHNIQUE: Multidetector CT imaging of the chest was performed using the standard protocol during bolus administration of intravenous contrast. Multiplanar CT image reconstructions and MIPs were obtained to evaluate the vascular anatomy. RADIATION DOSE REDUCTION: This exam was performed according to the departmental dose-optimization program which includes automated exposure control, adjustment of the mA and/or kV according to patient size and/or use of iterative reconstruction technique. CONTRAST:  75mL OMNIPAQUE IOHEXOL 350 MG/ML SOLN COMPARISON:  September 07, 2018 FINDINGS: Cardiovascular: There is mild calcification of the aortic arch, without evidence of aortic aneurysm or dissection. Satisfactory opacification of the pulmonary arteries to the segmental level. No evidence of pulmonary embolism. Normal heart size with mild coronary artery calcification. A very small, approximately 4.6 mm thick, anterior pericardial effusion is noted. Mediastinum/Nodes: No enlarged mediastinal, hilar, or  axillary lymph nodes. Thyroid gland, trachea, and esophagus demonstrate no significant findings. Lungs/Pleura: Moderate severity emphysematous lung disease is seen involving predominantly the bilateral upper lobes. Mild atelectatic changes are seen along the periphery of bilateral lung bases. There is no evidence of a pleural effusion or pneumothorax. Upper Abdomen: A 5.5 cm x 4.5 cm nonenhancing, low-attenuation (approximately 11.87 Hounsfield units) left adrenal mass is seen. This lesion contains a 2 mm focus of calcification and measured approximately 4.7 cm x 4.0 cm on the prior study. Musculoskeletal: No chest wall abnormality. No acute or significant osseous findings. Review of the MIP images confirms the above findings. IMPRESSION: 1. No evidence of pulmonary embolism or other acute cardiopulmonary disease. 2. Moderate severity emphysematous lung disease. 3. Large left adrenal mass which is likely benign. This recommendation follows ACR consensus guidelines: Management of Incidental Adrenal Masses: A White Paper of the ACR Incidental Findings Committee. J Am Coll Radiol 2017;14:1038-1044. Aortic Atherosclerosis (ICD10-I70.0) and Emphysema (ICD10-J43.9). Electronically Signed   By: Aram Candela M.D.   On: 03/23/2021 18:03   DG Chest Port 1 View  Result Date: 03/23/2021 CLINICAL DATA:  Shortness of breath. EXAM: PORTABLE CHEST 1 VIEW COMPARISON:  AP chest 07/24/2019 FINDINGS: Cardiac silhouette and mediastinal contours are within normal limits with mild calcification within aortic arch. Cardiac loop recorder again overlies the left hemithorax. Flattening of the diaphragms and moderate hyperinflation with increased lucencies again suggesting chronic emphysematous change. No focal airspace opacity to indicate pneumonia. No pulmonary edema, pleural effusion or pneumothorax. Mild multilevel degenerative disc changes of the thoracic spine. IMPRESSION: No significant change from prior.  COPD.  No acute lung  process. Electronically Signed   By: Neita Garnet M.D.   On: 03/23/2021 17:15    EKG: Independently reviewed.  Sinus tachycardia.  Assessment/Plan Principal Problem:   SIRS (systemic inflammatory response syndrome) (HCC) Active Problems:   Type 2 diabetes mellitus with hyperglycemia (HCC)   Nausea vomiting and diarrhea    SIRS with possible developing sepsis -source not clear could be intra-abdominal particularly enteritis given that patient has benign nausea vomiting.  Patient has been started on empiric antibiotics we will continue with aggressive hydration and empiric antibiotics follow cultures.  If patient has further diarrhea we will check stool studies. Diabetes mellitus type 2 uncontrolled with  hyperglycemia initially anion gap is elevated.  We will repeat metabolic panel stat if still elevated will start patient on DKA protocol.  Check hemoglobin A1c.  If anion gap has improved we will keep patient on long-acting insulin Lantus with sliding scale coverage. Nausea vomiting and diarrhea likely from gastroenteritis.  Continue hydration.  If there is any further episodes of diarrhea will check stool studies. Tachycardia -appears to be in sinus tachycardia.  Likely from sepsis physiology.  We will check TSH continue to monitor closely for any arrhythmias. History of PE has not taken her medications for last 2 months.  Will restart Eliquis.  CT angiogram did not show any new PE. Left adrenal mass will need MRI for further characterization. History of stroke on Plavix and statins. Tobacco abuse advised about quitting. Thrombocytopenia could be from sepsis.  Follow CBC and if any further decline will need further work-up for hemolytic process.  Since patient has septic physiology.  Uncontrolled diabetes tachycardia will need close monitoring for any further worsening inpatient status.   DVT prophylaxis: Apixaban. Code Status: Full code. Family Communication: Discussed with patient's  husband at the bedside. Disposition Plan: Home. Consults called: None. Admission status: Inpatient.   Eduard Clos MD [facility] Pager [phone].  If 7PM-7AM, please contact night-coverage [email or web address] Password [facility]  03/24/2021, 2:39 AM

## 2021-03-24 NOTE — Assessment & Plan Note (Signed)
Etiology multifactorial secondary to some component of either the swelling that she has versus noncardiac causes-troponin is gray zone elevated and EKG reviewed by me this morning showed sinus tachycardia only without any T wave inversions ?Continue troponin checks ?Obtaining echocardiogram as above to ensure no vascular compromise ?-I think troponin may be elevated because she has developing sepsis ?

## 2021-03-24 NOTE — Progress Notes (Signed)
Pt c/o chest pain and leg pain this AM. ?MD Samtani notified and aware. ?EKG obtained and placed in pts chart , seen by  MD. BP stable this time. ?Pt has been ST on the monitor ( 110-119). ? ?Will continue to monitor. ?

## 2021-03-24 NOTE — Progress Notes (Signed)
RT NOTE: ? ?Pt taken off BiPAP and placed on 3L [place]. Pt WOB has improved from earlier when BiPAP was initiated. Pt states that she feels much better now. RT explained that she can stay off for a couple of hours and then would need to go back on tonight. RN is aware. ?

## 2021-03-24 NOTE — Consult Note (Signed)
? ?  NAME:  [name], MRN:  [id number], DOB:  1965-01-14, LOS: 0 ?ADMISSION DATE:  03/23/2021, CONSULTATION DATE:  03/24/2021 ?REFERRING MD:  Dr Verlon Au, CHIEF COMPLAINT: Shortness of breath, respiratory failure ?History of Present Illness:  ?Patient came in with nausea, vomiting, abdominal discomfort ?History of CVA, history of DVT/PE, history of adrenal mass-felt to be possibly benign on recent CT.  Most recent CT scan of the chest was negative for pulmonary embolism. ?Underlying obstructive lung disease, active smoker who is not willing to quit ?Concern for sepsis with elevated lactate, fever. ?Underlying history of diabetes with possible DKA ? ?Pertinent  Medical History  ? ?Past Medical History:  ?Diagnosis Date  ? Acid reflux   ? Adrenal benign tumor   ? Apparently enlarging and patient "is supposed to have it taken out"  ? Anxiety   ? Depression   ? Diabetes mellitus without complication ([place])   ? DVT (deep vein thrombosis) in pregnancy   ? Hypertension   ? PE (pulmonary embolism)   ? Stroke [facility])   ? ? ?Significant Hospital Events: ?Including procedures, antibiotic start and stop dates in addition to other pertinent events   ?Abdominal CT without adrenal mass on 3/16 ?Attest 03/23/2021 with no PE, no infiltrative process ? ?Interim History / Subjective:  ?Feels fatigued, increased work of breathing ? ?Objective   ?Blood pressure 110/69, pulse (!) 114, temperature 98.2 ?F (36.8 ?C), temperature source Oral, resp. rate (!) 33, height '5\' 6"'$  (1.676 m), weight 58.4 kg, SpO2 95 %. ?   ?   ? ?Intake/Output Summary (Last 24 hours) at 03/24/2021 1150 ?Last data filed at 03/24/2021 1035 ?Gross per 24 hour  ?Intake 4267.74 ml  ?Output 1750 ml  ?Net 2517.74 ml  ? ?Filed Weights  ? 03/23/21 1544 03/24/21 0134  ?Weight: 65.3 kg 58.4 kg  ? ? ?Examination: ?General: Middle-aged lady, not in extremis, chronically ill-appearing ?HENT: Moist oral mucosa ?Lungs: Decreased air entry bilaterally, does have wheezing  bilaterally ?Cardiovascular: S1-S2 appreciated ?Abdomen: Soft, bowel sounds appreciated ?Extremities: No clubbing, no edema ?Neuro: Alert and oriented to person place ?GU: Fair output ? ?Resolved Hospital Problem list   ? ? ?Assessment & Plan:  ?Sepsis ?-Present on admission ?-Tachycardia, elevated lactate ?-Source is unclear at present-no focal infiltrate on chest x-ray no significant urinary symptoms, abdominal pain/discomfort may be related to DKA ?-Continue empiric antibiotics ? ?Type 2 diabetes ?DKA ?-Increased, acidemia ?-This appears to be improving ?-On insulin drip ? ?Chronic obstructive pulmonary disease/emphysema ?-Optimize bronchodilators- on xopenex ?-Pulmicort nebulization added ? ?Multifactorial stress on cardiorespiratory status ?Extensive emphysema ?Active smoker-stated she will not be quitting smoking ? ?I believe she just does not have adequate reserves, on appropriate intervention at present ? ?She is a DNR ?May accept BiPAP if needed if there is respiratory decompensation ? ?Sherrilyn Rist, MD ?[facility] ?Pager: See Amion ? ? ? ? ? ?

## 2021-03-24 NOTE — Assessment & Plan Note (Signed)
Patient has been noncompliant with anticoagulation secondary to cost-appreciate pharmacy looking into patient assistance programs ?Obtain echocardiogram to ensure no impending and hemodynamic compromise although CT scan does not really show anything new in terms of new PE ?

## 2021-03-24 NOTE — Assessment & Plan Note (Signed)
Prior CVA left PCA 07/31/2013 ?Continue with aspirin Plavix ?With DOAC on board need to be careful with regards to bleeding ?May benefit in the outpatient setting from rheumatological work-up (has had some work-up in the past but may need to be tested for lupus anticoagulant in the outpatient setting) ?

## 2021-03-24 NOTE — Progress Notes (Signed)
Inpatient Diabetes Program Recommendations ? ?AACE/ADA: New Consensus Statement on Inpatient Glycemic Control (2015) ? ?Target Ranges:  Prepandial:   less than 140 mg/dL ?     Peak postprandial:   less than 180 mg/dL (1-2 hours) ?     Critically ill patients:  140 - 180 mg/dL  ? ?Lab Results  ?Component Value Date  ? GLUCAP 157 (H) 03/24/2021  ? HGBA1C 8.9 (H) 07/31/2013  ? ? ?Review of Glycemic Control ? ?Diabetes history: DM2 ?Outpatient Diabetes medications: None. Has been on metformin in the past ?Current orders for Inpatient glycemic control: IV insulin per [employer] for DKA ? ?Insulin infusion rate - 2.6 unit/hour ? ?Inpatient Diabetes Program Recommendations:   ? ?Continue with IV insulin until criteria met for discontinuation. Give basal insulin 1-2H prior to discontinuation of drip. ? ?Need updated HgbA1C to assess glycemic control prior to admission ? ?Will continue to follow. ? ?Thank you. ?Lorenda Peck, RD, LDN, CDE ?Inpatient Diabetes Coordinator ?[phone]  ? ? ? ? ?

## 2021-03-24 NOTE — Assessment & Plan Note (Signed)
Possible early DKA? ?Patient has gap acidosis with anion gap of 16 and a CO2 of 14 ?In addition to hyponatremia ?We will start insulin gtt., allow noncaloric liquids and reassess later today and hopefully transition the patient back to regular diet ?

## 2021-03-24 NOTE — ED Notes (Signed)
Checked CBG 367, RN James informed ?

## 2021-03-24 NOTE — TOC Initial Note (Signed)
Transition of Care (TOC) - Initial/Assessment Note  ? ?Patient Details  ?Name: [name] ?MRN: [id number] ?Date of Birth: 1965-01-12 ? ?Transition of Care (TOC) CM/SW Contact:    ?Sherie Don, LCSW ?Phone Number: ?03/24/2021, 1:25 PM ? ?Clinical Narrative: Patient is uninsured and TOC was notified that patient may need medication assistance at discharge. TOC to follow in the event that patient needs a MATCH voucher at discharge. ? ?Expected Discharge Plan: Home/Self Care ?Barriers to Discharge: Continued Medical Work up, Inadequate or no insurance ? ?Patient Goals and CMS Choice ?Choice offered to / list presented to : NA ? ?Expected Discharge Plan and Services ?Expected Discharge Plan: Home/Self Care ?In-house Referral: Clinical Social Work ?Post Acute Care Choice: NA ?Living arrangements for the past 2 months: [place]           ?DME Arranged: N/A ?DME Agency: NA ? ?Prior Living Arrangements/Services ?Living arrangements for the past 2 months: [place] ?Lives with:: Spouse ?Patient language and need for interpreter reviewed:: Yes ?Need for Family Participation in Patient Care: No (Comment) ?Care giver support system in place?: Yes (comment) ?Criminal Activity/Legal Involvement Pertinent to Current Situation/Hospitalization: No - Comment as needed ? ?Activities of Daily Living ?Home Assistive Devices/Equipment: None ?ADL Screening (condition at time of admission) ?Patient's cognitive ability adequate to safely complete daily activities?: Yes ?Is the patient deaf or have difficulty hearing?: No ?Does the patient have difficulty seeing, even when wearing glasses/contacts?: No ?Does the patient have difficulty concentrating, remembering, or making decisions?: No ?Patient able to express need for assistance with ADLs?: Yes ?Does the patient have difficulty dressing or bathing?: No ?Independently performs ADLs?: Yes (appropriate for developmental age) ?In/Out Bed: Independent ?Walks in Home:  Independent ?Does the patient have difficulty walking or climbing stairs?: No ?Weakness of Legs: Both ?Weakness of Arms/Hands: Both ? ?Emotional Assessment ?Orientation: : Oriented to Self, Oriented to Place, Oriented to  Time, Oriented to Situation ?Alcohol / Substance Use: Tobacco Use ?Psych Involvement: No (comment) ? ?Admission diagnosis:  Severe sepsis ([place]) [A41.9, R65.20] ?SIRS (systemic inflammatory response syndrome) (HCC) [R65.10] ?Patient Active Problem List  ? Diagnosis Date Noted  ? SIRS (systemic inflammatory response syndrome) ([place]) 03/24/2021  ? Type 2 diabetes mellitus with hyperglycemia ([place]) 03/24/2021  ? Nausea vomiting and diarrhea 03/24/2021  ? Recurrent pulmonary embolism ([place]) 03/24/2021  ? Chest pain 03/24/2021  ? CKD (chronic kidney disease), stage III ([place]) 05/17/2016  ? Type 2 diabetes mellitus with vascular disease ([place]) 05/17/2016  ? Essential hypertension 05/17/2016  ? DVT (deep venous thrombosis) ([place]) 05/16/2016  ? Chronic ischemic left PCA stroke 07/30/2013  ? TIA (transient ischemic attack) 07/30/2013  ? Posterior circulation stroke of uncertain pathology ([place]) 07/30/2013  ? ?PCP:  Ephriam Jenkins, PA (Inactive) ?Pharmacy:   ?[facility] [address] ?[place] ?[place] [address] ?Phone: [phone] Fax: [phone] ? ?[facility] ?[address] ?[place] [address] ?Phone: [phone] Fax: [phone] ? ?Readmission Risk Interventions ?No flowsheet data found. ? ?

## 2021-03-24 NOTE — Progress Notes (Signed)
Echocardiogram ?2D Echocardiogram has been performed. ? ?[name] ?03/24/2021, 3:34 PM ?

## 2021-03-24 NOTE — Hospital Course (Addendum)
53 WF home dwelling HTN DM TY 2  prior tobacco abuse with underlying COPD ?Patient has had a saddle pulmonary embolism 02/13/2008 with RLE DVT-at the time hematology was consulted and recommended follow-up of lupus anticoagulant in 2 months ?She also has benign left adrenal adenoma which was worked up with negative hormonal work-up inclusive of testosterone estradiol and estrogen panels ?She had a CVA 07/31/2013, left PCA stroke ?Patient went on to have another DVT and this was found to be going up to the common femoral-patient was advised to continue her blood thinner ? ?Patient presented with swelling of the right side of neck/facial area nausea vomiting- ?found to be tachycardic Tmax 101.8 WBC 11.7 lactic acidosis 3.6-->2.4 CBG 526 with gap of 19 ?CTA showed large left adrenal mass which was likely felt to be a benign (see above) and showed severe emphysema ?She was also found to have a very small 4.6 cm anterior pericardial effusion ? ?Troponins were marginally elevated and lactic acid has come down to 1.2 ? ?Eventually patient was found to have gram-negative Enterobacter bacteremia ?She developed acute hypoxic respiratory failure later in the day on 3/17 and was placed on BiPAP-I had long discussions with her family ?She improved on 3/18 and was able to come off BiPAP and tolerate fluids orally ?3/19-flipped into A-fib RVR with sinus tach subsequently and self converted-curbside cardiology ?3/20-stabilizing and transferred to progressive unit and converted to oral antibiotics ? ?

## 2021-03-24 NOTE — Progress Notes (Signed)
PHARMACY - PHYSICIAN COMMUNICATION ?CRITICAL VALUE ALERT - BLOOD CULTURE IDENTIFICATION (BCID) ? ?[name] is an 56 y.o. female who presented to [facility] on 03/23/2021 with a chief complaint of  "swelling of the right side of her head and neck/shoulder" ? ?Assessment:  4/4 bottles GNR of enterobacterales class with no ID yet ( could be citrobacter or different species of PsA) ? ?Name of physician (or Provider) Contacted: Samtani ? ?Current antibiotics: Ceftriaxone/Flagyl ? ?Changes to prescribed antibiotics recommended:  ?Change to Cefepime 2 gm IV q8h, keep Flagyl for now ? ?Results for orders placed or performed during the hospital encounter of 03/23/21  ?Blood Culture ID Panel (Reflexed) (Collected: 03/23/2021  6:40 PM)  ?Result Value Ref Range  ? Enterococcus faecalis NOT DETECTED NOT DETECTED  ? Enterococcus Faecium NOT DETECTED NOT DETECTED  ? Listeria monocytogenes NOT DETECTED NOT DETECTED  ? Staphylococcus species NOT DETECTED NOT DETECTED  ? Staphylococcus aureus (BCID) NOT DETECTED NOT DETECTED  ? Staphylococcus epidermidis NOT DETECTED NOT DETECTED  ? Staphylococcus lugdunensis NOT DETECTED NOT DETECTED  ? Streptococcus species NOT DETECTED NOT DETECTED  ? Streptococcus agalactiae NOT DETECTED NOT DETECTED  ? Streptococcus pneumoniae NOT DETECTED NOT DETECTED  ? Streptococcus pyogenes NOT DETECTED NOT DETECTED  ? A.calcoaceticus-baumannii NOT DETECTED NOT DETECTED  ? Bacteroides fragilis NOT DETECTED NOT DETECTED  ? Enterobacterales DETECTED (A) NOT DETECTED  ? Enterobacter cloacae complex NOT DETECTED NOT DETECTED  ? Escherichia coli NOT DETECTED NOT DETECTED  ? Klebsiella aerogenes NOT DETECTED NOT DETECTED  ? Klebsiella oxytoca NOT DETECTED NOT DETECTED  ? Klebsiella pneumoniae NOT DETECTED NOT DETECTED  ? Proteus species NOT DETECTED NOT DETECTED  ? Salmonella species NOT DETECTED NOT DETECTED  ? Serratia marcescens NOT DETECTED NOT DETECTED  ? Haemophilus influenzae NOT DETECTED NOT  DETECTED  ? Neisseria meningitidis NOT DETECTED NOT DETECTED  ? Pseudomonas aeruginosa NOT DETECTED NOT DETECTED  ? Stenotrophomonas maltophilia NOT DETECTED NOT DETECTED  ? Candida albicans NOT DETECTED NOT DETECTED  ? Candida auris NOT DETECTED NOT DETECTED  ? Candida glabrata NOT DETECTED NOT DETECTED  ? Candida krusei NOT DETECTED NOT DETECTED  ? Candida parapsilosis NOT DETECTED NOT DETECTED  ? Candida tropicalis NOT DETECTED NOT DETECTED  ? Cryptococcus neoformans/gattii NOT DETECTED NOT DETECTED  ? CTX-M ESBL NOT DETECTED NOT DETECTED  ? Carbapenem resistance IMP NOT DETECTED NOT DETECTED  ? Carbapenem resistance KPC NOT DETECTED NOT DETECTED  ? Carbapenem resistance NDM NOT DETECTED NOT DETECTED  ? Carbapenem resist OXA 48 LIKE NOT DETECTED NOT DETECTED  ? Carbapenem resistance VIM NOT DETECTED NOT DETECTED  ? ? ?Eudelia Bunch, Pharm.D ?03/24/2021 10:49 AM ? ?

## 2021-03-24 NOTE — Progress Notes (Signed)
?Progress Note ? ? ?Patient: [name] UDJ:[id number] DOB: Jul 24, 1965 DOA: 03/23/2021     0 ?DOS: the patient was seen and examined on 03/24/2021 ?  ?Brief hospital course: ?25 WF home dwelling HTN DM TY 2  prior tobacco abuse with underlying COPD ?Patient has had a saddle pulmonary embolism 02/13/2008 with RLE DVT-at the time hematology was consulted and recommended follow-up of lupus anticoagulant in 2 months ?She also has benign left adrenal adenoma which was worked up with negative hormonal work-up inclusive of testosterone estradiol and estrogen panels ?She had a CVA 07/31/2013, left PCA stroke ?Patient went on to have another DVT and this was found to be going up to the common femoral-patient was advised to continue her blood thinner ? ?Patient presented with swelling of the right side of neck/facial area nausea vomiting- ?found to be tachycardic Tmax 101.8 WBC 11.7 lactic acidosis 3.6-->2.4 CBG 526 with gap of 19 ?CTA showed large left adrenal mass which was likely felt to be a benign (see above) and showed severe emphysema ?She was also found to have a very small 4.6 cm anterior pericardial effusion ? ?Troponins were marginally elevated and lactic acid has come down to 1.2 ? ? ?Assessment and Plan: ?* SIRS (systemic inflammatory response syndrome) (HCC) ?DDX broad-UA performed but no urine culture unfortunately ?Received 2.8 L bolus ?Continue saline 200 cc/H ?Changed to ceftriaxone Flagyl IV-follow GI pathogen panel and obtain C. difficile if patient has very profuse [place] 7 diarrhea ?Monitor pulse rate etc.-this is likely secondary to sepsis physiology ? ? ?Chest pain ?Etiology multifactorial secondary to some component of either the swelling that she has versus noncardiac causes-troponin is gray zone elevated and EKG reviewed by me this morning showed sinus tachycardia only without any T wave inversions ?Continue troponin checks ?Obtaining echocardiogram as above to ensure no vascular compromise ?-I  think troponin may be elevated because she has developing sepsis ? ?Type 2 diabetes mellitus with hyperglycemia (HCC) ?Possible early DKA? ?Patient has gap acidosis with anion gap of 16 and a CO2 of 14 ?In addition to hyponatremia ?We will start insulin gtt., allow noncaloric liquids and reassess later today and hopefully transition the patient back to regular diet ? ?Recurrent pulmonary embolism ([place]) ?Patient has been noncompliant with anticoagulation secondary to cost-appreciate pharmacy looking into patient assistance programs ?Obtain echocardiogram to ensure no impending and hemodynamic compromise although CT scan does not really show anything new in terms of new PE ? 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 ? ? ? ?  ? ?Subjective: Patient awake alert complaining of some chest pain this morning ?Patient assessed at bedside seems stable tells me the pain is positional not "crushing chest pain" ?EKG reviewed ?Tells me she has not been compliant on any of her meds for "who knows how long" ? ? ?Physical Exam: ?Vitals:  ? 03/24/21 0500 03/24/21 0600 03/24/21 0700 03/24/21 0732  ?BP: 108/68 121/71 110/69   ?Pulse: (!) 110 (!) 114 (!) 114   ?Resp: 17 16 (!) 33   ?Temp:    98.2 ?F (36.8 ?C)  ?TempSrc:    Oral  ?SpO2: 95% 95% 95%   ?Weight:      ?Height:      ? ?  Disheveled white female no distress ?EOMI NCAT no icterus pallor ?Missing right ring finger ?Abdomen soft nontender no rebound no guarding ?ROM intact ?Chest clear no added sound ?Neurologically intact moving 4 limbs equally ? ?Data Reviewed: ? ?I have reviewed the results ? ?Family Communication: None present ? ?Disposition: ?Status is: Inpatient ?Remains inpatient  appropriate because: Needs DKA management sepsis management on stepdown unit ? Planned Discharge Destination: Home ? ? ? ?Time spent: 37 minutes ? ?Author: ?Nita Sells, MD ?03/24/2021 8:33 AM ? ?For on call review [email or web address]  ?

## 2021-03-25 LAB — CBC WITH DIFFERENTIAL/PLATELET
Abs Immature Granulocytes: 0.07 10*3/uL (ref 0.00–0.07)
Basophils Absolute: 0 10*3/uL (ref 0.0–0.1)
Basophils Relative: 0 %
Eosinophils Absolute: 0 10*3/uL (ref 0.0–0.5)
Eosinophils Relative: 0 %
HCT: 41.8 % (ref 36.0–46.0)
Hemoglobin: 14.4 g/dL (ref 12.0–15.0)
Immature Granulocytes: 1 %
Lymphocytes Relative: 12 %
Lymphs Abs: 1.4 10*3/uL (ref 0.7–4.0)
MCH: 32.4 pg (ref 26.0–34.0)
MCHC: 34.4 g/dL (ref 30.0–36.0)
MCV: 94.1 fL (ref 80.0–100.0)
Monocytes Absolute: 0.9 10*3/uL (ref 0.1–1.0)
Monocytes Relative: 8 %
Neutro Abs: 9 10*3/uL — ABNORMAL HIGH (ref 1.7–7.7)
Neutrophils Relative %: 79 %
Platelets: 116 10*3/uL — ABNORMAL LOW (ref 150–400)
RBC: 4.44 MIL/uL (ref 3.87–5.11)
RDW: 14.1 % (ref 11.5–15.5)
Smear Review: DECREASED
WBC: 11.4 10*3/uL — ABNORMAL HIGH (ref 4.0–10.5)
nRBC: 0 % (ref 0.0–0.2)

## 2021-03-25 LAB — GLUCOSE, CAPILLARY
Glucose-Capillary: 104 mg/dL — ABNORMAL HIGH (ref 70–99)
Glucose-Capillary: 127 mg/dL — ABNORMAL HIGH (ref 70–99)
Glucose-Capillary: 232 mg/dL — ABNORMAL HIGH (ref 70–99)
Glucose-Capillary: 243 mg/dL — ABNORMAL HIGH (ref 70–99)
Glucose-Capillary: 184 mg/dL — ABNORMAL HIGH (ref 70–99)

## 2021-03-25 LAB — PROTIME-INR
INR: 0.9 (ref 0.8–1.2)
Prothrombin Time: 12.1 seconds (ref 11.4–15.2)

## 2021-03-25 LAB — BASIC METABOLIC PANEL
Anion gap: 11 (ref 5–15)
BUN: 12 mg/dL (ref 6–20)
CO2: 23 mmol/L (ref 22–32)
Calcium: 8.7 mg/dL — ABNORMAL LOW (ref 8.9–10.3)
Chloride: 99 mmol/L (ref 98–111)
Creatinine, Ser: 0.63 mg/dL (ref 0.44–1.00)
GFR, Estimated: >60 mL/min (ref >60)
Glucose, Bld: 85 mg/dL (ref 70–99)
Potassium: 2.7 mmol/L — CL (ref 3.5–5.1)
Sodium: 133 mmol/L — ABNORMAL LOW (ref 135–145)

## 2021-03-25 LAB — MAGNESIUM: Magnesium: 1.9 mg/dL (ref 1.7–2.4)

## 2021-03-25 MED ORDER — HYDROXYZINE HCL 10 MG PO TABS
10.0000 mg | ORAL_TABLET | Freq: Three times a day (TID) | ORAL | Status: DC
  Administered 2021-03-25 – 2021-03-28 (×8): 10 mg via ORAL
  Filled 2021-03-25 (×8): qty 1

## 2021-03-25 MED ORDER — MELATONIN 5 MG PO TABS
5.0000 mg | ORAL_TABLET | Freq: Once | ORAL | Status: AC
  Administered 2021-03-25: 5 mg via ORAL
  Filled 2021-03-25: qty 1

## 2021-03-25 MED ORDER — POTASSIUM CHLORIDE 10 MEQ/100ML IV SOLN
10.0000 meq | INTRAVENOUS | Status: AC
  Administered 2021-03-25 (×4): 10 meq via INTRAVENOUS
  Filled 2021-03-25 (×4): qty 100

## 2021-03-25 MED ORDER — TRAMADOL HCL 50 MG PO TABS
50.0000 mg | ORAL_TABLET | Freq: Two times a day (BID) | ORAL | Status: DC | PRN
  Administered 2021-03-25: 50 mg via ORAL
  Filled 2021-03-25 (×2): qty 1

## 2021-03-25 MED ORDER — POTASSIUM CHLORIDE CRYS ER 20 MEQ PO TBCR
40.0000 meq | EXTENDED_RELEASE_TABLET | Freq: Two times a day (BID) | ORAL | Status: DC
  Administered 2021-03-25: 40 meq via ORAL
  Filled 2021-03-25: qty 2

## 2021-03-25 MED ORDER — ORAL CARE MOUTH RINSE
15.0000 mL | Freq: Two times a day (BID) | OROMUCOSAL | Status: DC
  Administered 2021-03-26 – 2021-03-28 (×3): 15 mL via OROMUCOSAL

## 2021-03-25 NOTE — Progress Notes (Signed)
Patient is currently on 5L with no signs of distress. BIPAP is only on standby, and breathing treatments given. RT will continue to monitor ?

## 2021-03-25 NOTE — Progress Notes (Addendum)
?Progress Note ? ? ?Patient: [name] SNK:[id number] DOB: February 25, 1965 DOA: 03/23/2021     1 ?DOS: the patient was seen and examined on 03/25/2021 ?  ?Brief hospital course: ?7 WF home dwelling HTN DM TY 2  prior tobacco abuse with underlying COPD ?Patient has had a saddle pulmonary embolism 02/13/2008 with RLE DVT-at the time hematology was consulted and recommended follow-up of lupus anticoagulant in 2 months ?She also has benign left adrenal adenoma which was worked up with negative hormonal work-up inclusive of testosterone estradiol and estrogen panels ?She had a CVA 07/31/2013, left PCA stroke ?Patient went on to have another DVT and this was found to be going up to the common femoral-patient was advised to continue her blood thinner ? ?Patient presented with swelling of the right side of neck/facial area nausea vomiting- ?found to be tachycardic Tmax 101.8 WBC 11.7 lactic acidosis 3.6-->2.4 CBG 526 with gap of 19 ?CTA showed large left adrenal mass which was likely felt to be a benign (see above) and showed severe emphysema ?She was also found to have a very small 4.6 cm anterior pericardial effusion ? ?Troponins were marginally elevated and lactic acid has come down to 1.2 ? ? ?Assessment and Plan: ? ?Acute hypoxic respiratory failure DDx sepsis + fluid resuscitation overload from DKA on admission ?Net goals of resuscitation are to keep fluids even as possible-she is +3 L from admission so we will discontinue IV fluids now and monitor her blood pressure on stepdown unit today and ?If she stabilizes without respiratory compromise, hypotension etc. likely we can send her to the floor in the morning ?If that is truly required for someone sicker, patient can be triaged off by charge nurse ?SIRS (systemic inflammatory response syndrome) (HCC) secondary to Klebsiella ornithinolytica ?DDX broad-UA performed but no urine culture unfortunately ?Continue cefepime monotherapy discontinued Flagyl and await  sensitivities to narrow to tablets will need at least 10 days total duration treatment ?Headache ?Unlikely ominous-probably from BiPAP not sleeping and being ill ?Continue Tylenol 650 every 6 as needed, can use tramadol every 12 as needed ?Moderate to severe hypokalemia ?Probably related to Lasix that was given on 3/17--magnesium reasonable, replace with 4 runs, give oral magnesium this evening as is tolerating and recheck in morning ?Chest pain ?Etiology multifactorial --troponin trend flat and likely related to sepsis physiology on admission ?Echocardiogram EF 65-70% HFpEF but no wall motion abnormality-I do not think she has heart failure ?Her sinus tachycardia is probably from anxiety-I have started hydroxyzine scheduled 3 times daily ?Type 2 diabetes mellitus with hyperglycemia (HCC) ?early DKA when patient was admitted on 3/16 ?Metabolic acidosis on admission ?Patient has gap acidosis with anion gap of 16 and a CO2 of 14--transitioned off drip rapidly, keep volumes even-discontinue IV fluid ?Acidosis is resolved as his sepsis physiology as well as dilutional hyponatremia from the DKA ?Monitor trends with labs in the morning ?Patient's hypokalemia is probably related to Lasix but also compensated secondary to rebound from the acidosis ?Recurrent pulmonary embolism ([place]) ?Patient has been noncompliant with anticoagulation secondary to cost-appreciate pharmacy looking into patient assistance programs ?She was placed on Lovenox because she needed to be on BiPAP on admission-she will need to be placed on DOAC on discharge and I will transition tomorrow if she is still doing well ?Echocardiogram shows no compaction/altered right ventricular physiology therefore no further work-up ?TIA (transient ischemic attack) ?Prior CVA left PCA 07/31/2013 ?Continue with Plavix ?With DOAC on board need to be careful with regards to bleeding ?  May benefit in the outpatient setting from rheumatological work-up (has had some work-up  in the past but may need to be tested for lupus anticoagulant in the outpatient setting) ? ?  ? ?Subjective:  ?Seen this morning and seems to be doing well and tolerating diet-reviewed this afternoon and looks much better-I took her off her oxygen and she did not desat-she has no chest pain no fever ?She still complains of a headache and wonders what that is from ?Husband also wonders what the source is-I explained to him we are not sure but we do need to treat-unfortunately urine culture was not obtained ? ? ? ?Physical Exam: ?Vitals:  ? 03/25/21 1200 03/25/21 1511 03/25/21 1541 03/25/21 1600  ?BP: (!) 108/58   114/66  ?Pulse: (!) 121  (!) 106 (!) 109  ?Resp: '20  18 17  '$ ?Temp: 99.2 ?F (37.3 ?C) 99.4 ?F (37.4 ?C)  98.7 ?F (37.1 ?C)  ?TempSrc: Oral Oral  Axillary  ?SpO2: 96%  96% 97%  ?Weight:      ?Height:      ? ?Awake coherent pleasant ?No icterus no pallor poor dentition ?No JVD ?S1-S2 no murmur ?CTA B rales rhonchi are absent ?ROM intact moving 4 limbs equally ? ?Data Reviewed: ? ?I have reviewed the results ? ?Family Communication: ?Discussion with the patient's husband at the bedside, Mr. Shereena Berquist [phone] ? ?Disposition: ?Status is: Inpatient ?Remains inpatient appropriate because: Needs management of sepsis can likely transfer to floor in the morning ? Planned Discharge Destination: Home in several days ? ? ? ?Time spent: 47 minutes ? ?Author: ?Nita Sells, MD ?03/25/2021 6:29 PM ? ?For on call review [email or web address]  ?

## 2021-03-26 LAB — CBC WITH DIFFERENTIAL/PLATELET
Abs Immature Granulocytes: 0.07 10*3/uL (ref 0.00–0.07)
Basophils Absolute: 0 10*3/uL (ref 0.0–0.1)
Basophils Relative: 0 %
Eosinophils Absolute: 0 10*3/uL (ref 0.0–0.5)
Eosinophils Relative: 0 %
HCT: 36.5 % (ref 36.0–46.0)
Hemoglobin: 12.2 g/dL (ref 12.0–15.0)
Immature Granulocytes: 1 %
Lymphocytes Relative: 19 %
Lymphs Abs: 2 10*3/uL (ref 0.7–4.0)
MCH: 31.8 pg (ref 26.0–34.0)
MCHC: 33.4 g/dL (ref 30.0–36.0)
MCV: 95.1 fL (ref 80.0–100.0)
Monocytes Absolute: 1 10*3/uL (ref 0.1–1.0)
Monocytes Relative: 9 %
Neutro Abs: 7.4 10*3/uL (ref 1.7–7.7)
Neutrophils Relative %: 71 %
Platelets: 133 10*3/uL — ABNORMAL LOW (ref 150–400)
RBC: 3.84 MIL/uL — ABNORMAL LOW (ref 3.87–5.11)
RDW: 14.3 % (ref 11.5–15.5)
WBC: 10.5 10*3/uL (ref 4.0–10.5)
nRBC: 0 % (ref 0.0–0.2)

## 2021-03-26 LAB — BASIC METABOLIC PANEL
Anion gap: 13 (ref 5–15)
BUN: 11 mg/dL (ref 6–20)
CO2: 20 mmol/L — ABNORMAL LOW (ref 22–32)
Calcium: 8.2 mg/dL — ABNORMAL LOW (ref 8.9–10.3)
Chloride: 102 mmol/L (ref 98–111)
Creatinine, Ser: 0.68 mg/dL (ref 0.44–1.00)
GFR, Estimated: >60 mL/min (ref >60)
Glucose, Bld: 224 mg/dL — ABNORMAL HIGH (ref 70–99)
Potassium: 3 mmol/L — ABNORMAL LOW (ref 3.5–5.1)
Sodium: 135 mmol/L (ref 135–145)

## 2021-03-26 LAB — CULTURE, BLOOD (ROUTINE X 2)
Special Requests: ADEQUATE
Special Requests: ADEQUATE

## 2021-03-26 LAB — POTASSIUM: Potassium: 3.6 mmol/L (ref 3.5–5.1)

## 2021-03-26 LAB — MAGNESIUM: Magnesium: 1.8 mg/dL (ref 1.7–2.4)

## 2021-03-26 LAB — GLUCOSE, CAPILLARY
Glucose-Capillary: 208 mg/dL — ABNORMAL HIGH (ref 70–99)
Glucose-Capillary: 212 mg/dL — ABNORMAL HIGH (ref 70–99)
Glucose-Capillary: 222 mg/dL — ABNORMAL HIGH (ref 70–99)
Glucose-Capillary: 352 mg/dL — ABNORMAL HIGH (ref 70–99)
Glucose-Capillary: 240 mg/dL — ABNORMAL HIGH (ref 70–99)
Glucose-Capillary: 225 mg/dL — ABNORMAL HIGH (ref 70–99)

## 2021-03-26 MED ORDER — MAGNESIUM SULFATE 2 GM/50ML IV SOLN
2.0000 g | Freq: Once | INTRAVENOUS | Status: AC
  Administered 2021-03-26: 2 g via INTRAVENOUS
  Filled 2021-03-26: qty 50

## 2021-03-26 MED ORDER — INSULIN DETEMIR 100 UNIT/ML ~~LOC~~ SOLN
15.0000 [IU] | Freq: Every day | SUBCUTANEOUS | Status: DC
  Administered 2021-03-27: 15 [IU] via SUBCUTANEOUS
  Filled 2021-03-26 (×2): qty 0.15

## 2021-03-26 MED ORDER — POTASSIUM CHLORIDE CRYS ER 20 MEQ PO TBCR: 40.0000 meq | EXTENDED_RELEASE_TABLET | Freq: Two times a day (BID) | ORAL | Status: DC

## 2021-03-26 MED ORDER — POTASSIUM CHLORIDE CRYS ER 20 MEQ PO TBCR
40.0000 meq | EXTENDED_RELEASE_TABLET | Freq: Three times a day (TID) | ORAL | Status: DC
  Administered 2021-03-26 – 2021-03-27 (×4): 40 meq via ORAL
  Filled 2021-03-26 (×4): qty 2

## 2021-03-26 MED ORDER — DILTIAZEM HCL 30 MG PO TABS
30.0000 mg | ORAL_TABLET | Freq: Three times a day (TID) | ORAL | Status: DC
  Administered 2021-03-26 – 2021-03-27 (×5): 30 mg via ORAL
  Filled 2021-03-26 (×5): qty 1

## 2021-03-26 NOTE — Progress Notes (Signed)
Inpatient Diabetes Program Recommendations ? ?AACE/ADA: New Consensus Statement on Inpatient Glycemic Control (2015) ? ?Target Ranges:  Prepandial:   less than 140 mg/dL ?     Peak postprandial:   less than 180 mg/dL (1-2 hours) ?     Critically ill patients:  140 - 180 mg/dL  ? ?Lab Results  ?Component Value Date  ? GLUCAP 352 (H) 03/26/2021  ? HGBA1C 8.9 (H) 07/31/2013  ? ? ?Review of Glycemic Control ? Latest Reference Range & Units 03/25/21 19:36 03/25/21 23:46 03/26/21 03:11 03/26/21 08:19 03/26/21 11:42  ?Glucose-Capillary 70 - 99 mg/dL 184 (H) 212 (H) 208 (H) 222 (H) 352 (H)  ?(H): Data is abnormally high ? ?Diabetes history: DM2 ?Outpatient Diabetes medications: None. Has been on metformin in the past ?Current orders for Inpatient glycemic control:Levemir 12 units q hs, Novolog 0-6 units q 4 hrs. ? ?Inpatient Diabetes Program Recommendations:   ?Please consider: ?-Change basal to Semglee 15 units qd ( will cover 24 hrs) ?-Add Novolog 2-3 units tid meal coverage if eats 50% ?-Change Novolog correction to tid + hs 0-5 units since eating ? ?Thank you, ?Nani Gasser Antwian Santaana, RN, MSN, CDE  ?Diabetes Coordinator ?Inpatient Glycemic Control Team ?Team Pager [phone] (8am-5pm) ?03/26/2021 12:18 PM ? ? ? ? ?

## 2021-03-26 NOTE — Progress Notes (Signed)
Pt is off of the bipap at this time and no resp distress noted. Pt is resting well and VSS. Machine remained bedside.  ?

## 2021-03-26 NOTE — Progress Notes (Signed)
?   03/25/21 2000  ?Provider Notification  ?Provider Name/Title X.Blount NP  ?Date Provider Notified 03/25/21  ?Time Provider Notified 2000  ?Notification Type Face-to-face  ?Notification Reason Change in status ?(patient has a rhythm change  and is now in A Fib with a heart rate of 120's. NP at patient's bedside,)  ?Provider response At bedside;No new orders  ?Date of Provider Response 03/25/21  ?Time of Provider Response 2000  ? ? ?

## 2021-03-26 NOTE — Progress Notes (Signed)
?Progress Note ? ? ?Patient: [name] PIR:[id number] DOB: [birth year] DOA: 03/23/2021     2 ?DOS: the patient was seen and examined on 03/26/2021 ?  ?Brief hospital course: ?72 WF home dwelling HTN DM TY 2  prior tobacco abuse with underlying COPD ?Patient has had a saddle pulmonary embolism 02/13/2008 with RLE DVT-at the time hematology was consulted and recommended follow-up of lupus anticoagulant in 2 months ?She also has benign left adrenal adenoma which was worked up with negative hormonal work-up inclusive of testosterone estradiol and estrogen panels ?She had a CVA 07/31/2013, left PCA stroke ?Patient went on to have another DVT and this was found to be going up to the common femoral-patient was advised to continue her blood thinner ? ?Patient presented with swelling of the right side of neck/facial area nausea vomiting- ?found to be tachycardic Tmax 101.8 WBC 11.7 lactic acidosis 3.6-->2.4 CBG 526 with gap of 19 ?CTA showed large left adrenal mass which was likely felt to be a benign (see above) and showed severe emphysema ?She was also found to have a very small 4.6 cm anterior pericardial effusion ? ?Troponins were marginally elevated and lactic acid has come down to 1.2 ? ?Eventually patient was found to have gram-negative Enterobacter bacteremia ?She developed acute hypoxic respiratory failure later in the day on 3/17 and was placed on BiPAP-I had long discussions with her family ?She improved on 3/18 and was able to come off BiPAP and tolerate fluids orally ?3/19-flipped into A-fib RVR with sinus tach subsequently and self converted-curbside cardiology ? ? ?Assessment and Plan: ? ?Acute hypoxic respiratory failure DDx sepsis + fluid resuscitation overload from DKA on admission ?Net goals of resuscitation are to keep fluids even as possible-she is +3  ?SIRS (systemic inflammatory response syndrome) (HCC) secondary to Klebsiella ornithinolytica ?DDX broad-UA performed but no urine culture  unfortunately ?Continue cefepime monotherapy and keep on IV given evolving A-fib-at least 10 days total duration treatment ?Headache ?Unlikely ominous-better ?Continue Tylenol 650 every 6 as needed, can use tramadol every 12 as needed ?Moderate to severe hypokalemia ?Probably related to Lasix that was given on 3/17--magnesium 1.8-increase oral potassium to 40x  3 times daily and recheck later this evening ?Chest pain ?Etiology multifactorial --troponin 2/2 sepsis physiology on admission ?Echocardiogram EF 65-70% HFpEF but no wall motion abnormality ?Paroxysmal A-fib 03/26/2021 ?Start Cardizem 30 every 8 and reassess blood pressure trends-suspect a substrate sepsis + beta agonist + hypokalemia ?Hold anticoagulation at this time ?EKG personally reviewed = sinus tach PR interval 0.04 QRS axis LAFB -40 degrees no acute ST-T wave changes-abnormal ?Type 2 diabetes mellitus with hyperglycemia ([place]) ?early DKA when patient was admitted on 3/16 ?Metabolic acidosis on admission ?Patient has gap acidosis with anion gap of 16 and a CO2 of 14--transitioned off drip rapidly, keep volumes even-discontinue IV fluid ?Acidosis is resolved  ?Recurrent pulmonary embolism ([place]) ?Patient has been noncompliant with anticoagulation secondary to cost-appreciate pharmacy looking into patient assistance programs ?She was placed on Lovenox because she needed to be on BiPAP on admission ?Echocardiogram shows no compaction/altered right ventricular physiology therefore no further work-up ?TIA (transient ischemic attack) ?Prior CVA left PCA 07/31/2013 ?Continue with Plavix ?outpatient setting from rheumatological work-up (has had some work-up in the past but may need to be tested for lupus anticoagulant in the outpatient setting) ? ?  ? ?Subjective:  ? ?Events overnight noted flipped into RVR transiently but this self converted within 15 minutes ?I have reviewed the patient this morning and she looks  a little bit tired but otherwise is fair ?She  has no chest pain ?She is tolerating diet ?She has not needed BiPAP ? ? ?Physical Exam: ?Vitals:  ? 03/26/21 0700 03/26/21 0800 03/26/21 0847 03/26/21 0849  ?BP: 113/65 115/67    ?Pulse: (!) 103  (!) 101   ?Resp: 15  19   ?Temp:  98 ?F (36.7 ?C)    ?TempSrc:  Oral    ?SpO2: 98%  98% 99%  ?Weight:      ?Height:      ? ?Awake coherent pleasant ?No icterus no pallor poor dentition ?S1-S2 telemetry sinus tach into the 120 range ?No JVD ?CTA B rales rhonchi are absent ?ROM intact moving 4 limbs equally ? ?Data Reviewed: ? ?I have reviewed the results ? ?Family Communication: ?Discussion with the patient's husband 3/19 [name] [phone] ? ?Disposition: ?Status is: Inpatient ?Remains inpatient appropriate because: ?Patient in a flutter and unstable needs to stay on stepdown ? Planned Discharge Destination: Home in several days ? ? ? ?Time spent: 55 minutes ? ?Author: ?Nita Sells, MD ?03/26/2021 10:09 AM ? ?For on call review [email or web address].  ?

## 2021-03-27 LAB — CBC WITH DIFFERENTIAL/PLATELET
Abs Immature Granulocytes: 0.08 10*3/uL — ABNORMAL HIGH (ref 0.00–0.07)
Basophils Absolute: 0.1 10*3/uL (ref 0.0–0.1)
Basophils Relative: 1 %
Eosinophils Absolute: 0.1 10*3/uL (ref 0.0–0.5)
Eosinophils Relative: 1 %
HCT: 35.3 % — ABNORMAL LOW (ref 36.0–46.0)
Hemoglobin: 11.5 g/dL — ABNORMAL LOW (ref 12.0–15.0)
Immature Granulocytes: 1 %
Lymphocytes Relative: 25 %
Lymphs Abs: 2.6 10*3/uL (ref 0.7–4.0)
MCH: 31.2 pg (ref 26.0–34.0)
MCHC: 32.6 g/dL (ref 30.0–36.0)
MCV: 95.7 fL (ref 80.0–100.0)
Monocytes Absolute: 0.8 10*3/uL (ref 0.1–1.0)
Monocytes Relative: 8 %
Neutro Abs: 6.8 10*3/uL (ref 1.7–7.7)
Neutrophils Relative %: 64 %
Platelets: 206 10*3/uL (ref 150–400)
RBC: 3.69 MIL/uL — ABNORMAL LOW (ref 3.87–5.11)
RDW: 14.5 % (ref 11.5–15.5)
WBC: 10.5 10*3/uL (ref 4.0–10.5)
nRBC: 0 % (ref 0.0–0.2)

## 2021-03-27 LAB — BASIC METABOLIC PANEL
Anion gap: 8 (ref 5–15)
BUN: 13 mg/dL (ref 6–20)
CO2: 24 mmol/L (ref 22–32)
Calcium: 8 mg/dL — ABNORMAL LOW (ref 8.9–10.3)
Chloride: 101 mmol/L (ref 98–111)
Creatinine, Ser: 0.64 mg/dL (ref 0.44–1.00)
GFR, Estimated: >60 mL/min (ref >60)
Glucose, Bld: 234 mg/dL — ABNORMAL HIGH (ref 70–99)
Potassium: 4.2 mmol/L (ref 3.5–5.1)
Sodium: 133 mmol/L — ABNORMAL LOW (ref 135–145)

## 2021-03-27 LAB — HEMOGLOBIN A1C
Hgb A1c MFr Bld: >15.5 % — ABNORMAL HIGH (ref 4.8–5.6)
Mean Plasma Glucose: >398 mg/dL

## 2021-03-27 LAB — GLUCOSE, CAPILLARY
Glucose-Capillary: 228 mg/dL — ABNORMAL HIGH (ref 70–99)
Glucose-Capillary: 225 mg/dL — ABNORMAL HIGH (ref 70–99)
Glucose-Capillary: 248 mg/dL — ABNORMAL HIGH (ref 70–99)
Glucose-Capillary: 294 mg/dL — ABNORMAL HIGH (ref 70–99)
Glucose-Capillary: 381 mg/dL — ABNORMAL HIGH (ref 70–99)

## 2021-03-27 LAB — MAGNESIUM: Magnesium: 1.9 mg/dL (ref 1.7–2.4)

## 2021-03-27 MED ORDER — CEFAZOLIN SODIUM-DEXTROSE 2-4 GM/100ML-% IV SOLN
2.0000 g | Freq: Three times a day (TID) | INTRAVENOUS | Status: DC
  Administered 2021-03-27 (×2): 2 g via INTRAVENOUS
  Filled 2021-03-27 (×5): qty 100

## 2021-03-27 MED ORDER — POTASSIUM CHLORIDE CRYS ER 20 MEQ PO TBCR
40.0000 meq | EXTENDED_RELEASE_TABLET | Freq: Two times a day (BID) | ORAL | Status: DC
  Administered 2021-03-27 – 2021-03-28 (×2): 40 meq via ORAL
  Filled 2021-03-27 (×2): qty 2

## 2021-03-27 MED ORDER — APIXABAN 5 MG PO TABS
5.0000 mg | ORAL_TABLET | Freq: Two times a day (BID) | ORAL | Status: DC
  Administered 2021-03-27 – 2021-03-28 (×2): 5 mg via ORAL
  Filled 2021-03-27 (×2): qty 1

## 2021-03-27 MED ORDER — DILTIAZEM HCL ER COATED BEADS 120 MG PO CP24
120.0000 mg | ORAL_CAPSULE | Freq: Every day | ORAL | Status: DC
  Administered 2021-03-27: 120 mg via ORAL
  Filled 2021-03-27: qty 1

## 2021-03-27 MED ORDER — INSULIN ASPART 100 UNIT/ML IJ SOLN
5.0000 [IU] | Freq: Once | INTRAMUSCULAR | Status: AC
  Administered 2021-03-27: 5 [IU] via SUBCUTANEOUS

## 2021-03-27 NOTE — Progress Notes (Signed)
PT not on BiPAP at this time- no respiratory distress noted at this time. ?

## 2021-03-27 NOTE — Progress Notes (Signed)
?Progress Note ? ? ?Patient: [name] MWN:[id number] DOB: [birth year] DOA: 03/23/2021     3 ?DOS: the patient was seen and examined on 03/27/2021 ?  ?Brief hospital course: ?64 WF home dwelling HTN DM TY 2  prior tobacco abuse with underlying COPD ?Patient has had a saddle pulmonary embolism 02/13/2008 with RLE DVT-at the time hematology was consulted and recommended follow-up of lupus anticoagulant in 2 months ?She also has benign left adrenal adenoma which was worked up with negative hormonal work-up inclusive of testosterone estradiol and estrogen panels ?She had a CVA 07/31/2013, left PCA stroke ?Patient went on to have another DVT and this was found to be going up to the common femoral-patient was advised to continue her blood thinner ? ?Patient presented with swelling of the right side of neck/facial area nausea vomiting- ?found to be tachycardic Tmax 101.8 WBC 11.7 lactic acidosis 3.6-->2.4 CBG 526 with gap of 19 ?CTA showed large left adrenal mass which was likely felt to be a benign (see above) and showed severe emphysema ?She was also found to have a very small 4.6 cm anterior pericardial effusion ? ?Troponins were marginally elevated and lactic acid has come down to 1.2 ? ?Eventually patient was found to have gram-negative Enterobacter bacteremia ?She developed acute hypoxic respiratory failure later in the day on 3/17 and was placed on BiPAP-I had long discussions with her family ?She improved on 3/18 and was able to come off BiPAP and tolerate fluids orally ?3/19-flipped into A-fib RVR with sinus tach subsequently and self converted-curbside cardiology ? ? ?Assessment and Plan: ? ?Acute hypoxic respiratory failure DDx sepsis + fluid resuscitation overload from DKA on admission ?Net goals of resuscitation are to keep fluids even as possible-she is only +1.08 today ?SIRS (systemic inflammatory response syndrome) (HCC) secondary to Klebsiella ornithinolytica ?DDX broad-UA performed but no urine culture  unfortunately ?Transition cefepime to Ancef-because of bacteremia will need at least 10 days therapy ending 3/25--may be able to convert to orals after another 2 to 3 days? ?Headache ?better ?Tylenol 650 every 6 as needed, can use tramadol every 12 as needed ?Moderate to severe hypokalemia ?Probably related to Lasix that was given on 3/17--magnesium close to normal ?Potassium dosage decreased to twice daily 40 ?Chest pain ?Etiology multifactorial --troponin 2/2 sepsis physiology on admission ?Echocardiogram EF 65-70% HFpEF--t no wall motion abnormality ?Paroxysmal A-fib 03/26/2021--suspect a substrate sepsis + beta agonist + hypokalemia ?Change short acting to Cardizem CD 120 ?Still slight tachy ?Monitor trends on Progressive unit ?Type 2 diabetes mellitus with hyperglycemia (HCC) ?early DKA when patient was admitted on 3/16 ?Metabolic acidosis on admission ?Had anion gap acidosis of 16 and a CO2 of 14--transitioned off drip rapidly, keep volumes even-discontinue IV fluid ?Acidosis is resolved  ?Recurrent pulmonary embolism ([place]) ?noncompliant with anticoagulation secondary to cost--? patient assistance programs ?Change to DOAC from lovenox ?Echocardiogram shows no heart strain ?TIA (transient ischemic attack) ?Prior CVA left PCA 07/31/2013 ?Continue with Plavix ?outpatient setting from rheumatological work-up (has had some work-up in the past but may need to be tested for lupus anticoagulant in the outpatient setting) ? ?  ? ?Subjective:  ? ?Awake no distress ?Tol diet ?Less SOB ?Not needing bipap during the day ?Conversant ?No cp ? ? ?Physical Exam: ?Vitals:  ? 03/27/21 0515 03/27/21 0600 03/27/21 0700 03/27/21 0806  ?BP: (!) 151/85 121/62 117/81   ?Pulse: (!) 108 (!) 113 (!) 109   ?Resp: '19 20 20   '$ ?Temp:    98.4 ?F (36.9 ?C)  ?  TempSrc:    Oral  ?SpO2: 98% 94% 99% 95%  ?Weight:      ?Height:      ? ?Eomi ncat no focal deficit ?Abd soft nt nd no rebound no guard ?No wheeze compared to prior ?Abd soft, no le  edema ?Neuro intact moving limbs x 4 ?Psych euthymic ? ?Data Reviewed: ? ?Sodium reasonable-gap 8, wbc 10, heme 11, plt 206 ? ?Family Communication: ?Discussion with the patient's husband daily Mr. Ether Goebel [phone] ? ?Disposition: ?Status is: Inpatient ?Remains inpatient appropriate because: needs to transition to po abx--can go to progressive if bed needed ? Planned Discharge Destination: Home in several days ? ? ? ?Time spent: 35 minutes ? ?Author: ?Nita Sells, MD ?03/27/2021 10:48 AM ? ?For on call review [email or web address].  ?

## 2021-03-27 NOTE — Progress Notes (Signed)
Pt refused to use bipap tonight. Pt is on RA and no resp distress noted at this time. Pt states she does not wear any machine at home and she feels much better.  ?

## 2021-03-27 NOTE — Progress Notes (Signed)
ANTICOAGULATION CONSULT NOTE ? ?Pharmacy Consult for Eliquis ?Indication: VTE treatment ? ?Allergies  ?Allergen Reactions  ? Amoxicillin Hives  ? Aspirin Hives  ? Codeine Hives  ? Penicillins Hives  ?  Has patient had a PCN reaction causing immediate rash, facial/tongue/throat swelling, SOB or lightheadedness with hypotension: Yes ?Has patient had a PCN reaction causing severe rash involving mucus membranes or skin necrosis: No ?Has patient had a PCN reaction that required hospitalization No ?Has patient had a PCN reaction occurring within the last 10 years: Yes ?If all of the above answers are "NO", then may proceed with Cephalosporin use. ?  ? ? ?Patient Measurements: ?Height: '5\' 6"'$  (167.6 cm) ?Weight: 58.4 kg (128 lb 12 oz) ?IBW/kg (Calculated) : 59.3 ? ?Vital Signs: ?Temp: 98.1 ?F (36.7 ?C) (03/20 1226) ?Temp Source: Oral (03/20 1226) ?BP: 117/81 (03/20 0700) ?Pulse Rate: 109 (03/20 0700) ? ?Labs: ?Recent Labs  ?  03/25/21 ?0241 03/26/21 ?0251 03/27/21 ?0305  ?HGB 14.4 12.2 11.5*  ?HCT 41.8 36.5 35.3*  ?PLT 116* 133* 206  ?LABPROT 12.1  --   --   ?INR 0.9  --   --   ?CREATININE 0.63 0.68 0.64  ? ? ? ?Estimated Creatinine Clearance: 71.5 mL/min (by C-G formula based on SCr of 0.64 mg/dL). ? ? ?Medical History: ?Past Medical History:  ?Diagnosis Date  ? Acid reflux   ? Adrenal benign tumor   ? Apparently enlarging and patient "is supposed to have it taken out"  ? Anxiety   ? Depression   ? Diabetes mellitus without complication ([place])   ? DVT (deep vein thrombosis) in pregnancy   ? Hypertension   ? PE (pulmonary embolism)   ? Stroke [facility])   ? ? ?Medications: Pt prescribed apixaban 5 mg PO BID PTA, but is not compliant ? ?Assessment: ?Pt is a 2 yoF with PMH significant for PE, prescribed apixaban PTA. Pt has not been compliant with medication recently, but was resumed on admission. Patient transitioned to LMWH while on BiPAP and unable to take oral meds. Pharmacy has been consulted to transition back to apixaban.   ? ?Today, 03/27/21 ?CBC stable ?SCr < 1.0, stable ? ?Goal of Therapy:  ?Monitor platelets by anticoagulation protocol: Yes ?  ?Plan:  ?Apixaban 5 mg BID to start tonight ? ?Tawnya Crook, PharmD, BCPS ?Clinical Pharmacist ?03/27/2021 1:40 PM ? ?

## 2021-03-28 ENCOUNTER — Other Ambulatory Visit (HOSPITAL_COMMUNITY): Payer: Self-pay

## 2021-03-28 DIAGNOSIS — L8990 Pressure ulcer of unspecified site, unspecified stage: Secondary | ICD-10-CM | POA: Insufficient documentation

## 2021-03-28 LAB — GLUCOSE, CAPILLARY
Glucose-Capillary: 272 mg/dL — ABNORMAL HIGH (ref 70–99)
Glucose-Capillary: 260 mg/dL — ABNORMAL HIGH (ref 70–99)
Glucose-Capillary: 249 mg/dL — ABNORMAL HIGH (ref 70–99)

## 2021-03-28 LAB — BASIC METABOLIC PANEL
Anion gap: 9 (ref 5–15)
BUN: 13 mg/dL (ref 6–20)
CO2: 25 mmol/L (ref 22–32)
Calcium: 8.8 mg/dL — ABNORMAL LOW (ref 8.9–10.3)
Chloride: 103 mmol/L (ref 98–111)
Creatinine, Ser: 0.62 mg/dL (ref 0.44–1.00)
GFR, Estimated: >60 mL/min (ref >60)
Glucose, Bld: 239 mg/dL — ABNORMAL HIGH (ref 70–99)
Potassium: 4.3 mmol/L (ref 3.5–5.1)
Sodium: 137 mmol/L (ref 135–145)

## 2021-03-28 MED ORDER — INSULIN ASPART PROT & ASPART (70-30 MIX) 100 UNIT/ML PEN
12.0000 [IU] | PEN_INJECTOR | Freq: Two times a day (BID) | SUBCUTANEOUS | 11 refills | Status: DC
  Filled 2021-03-28: qty 15, 63d supply, fill #0

## 2021-03-28 MED ORDER — SULFAMETHOXAZOLE-TRIMETHOPRIM 800-160 MG PO TABS
1.0000 | ORAL_TABLET | Freq: Two times a day (BID) | ORAL | 0 refills | Status: DC
Start: 2021-03-28 — End: 2021-05-01
  Filled 2021-03-28: qty 4, 2d supply, fill #0

## 2021-03-28 MED ORDER — LEVALBUTEROL HCL 0.63 MG/3ML IN NEBU
0.6300 mg | INHALATION_SOLUTION | Freq: Two times a day (BID) | RESPIRATORY_TRACT | 12 refills | Status: DC
  Filled 2021-03-28: qty 150, 25d supply, fill #0
  Filled 2021-03-28: qty 3, 1d supply, fill #0

## 2021-03-28 MED ORDER — INSULIN ASPART PROT & ASPART (70-30 MIX) 100 UNIT/ML ~~LOC~~ SUSP
12.0000 [IU] | Freq: Two times a day (BID) | SUBCUTANEOUS | Status: DC
  Administered 2021-03-28 (×2): 12 [IU] via SUBCUTANEOUS
  Filled 2021-03-28: qty 10

## 2021-03-28 MED ORDER — APIXABAN 5 MG PO TABS: 5.0000 mg | ORAL_TABLET | Freq: Two times a day (BID) | ORAL | 11 refills | Status: DC

## 2021-03-28 MED ORDER — BUDESONIDE 0.5 MG/2ML IN SUSP
0.5000 mg | Freq: Two times a day (BID) | RESPIRATORY_TRACT | 12 refills | Status: DC
  Filled 2021-03-28: qty 120, 30d supply, fill #0
  Filled 2021-03-28: qty 2, 1d supply, fill #0

## 2021-03-28 MED ORDER — BLOOD GLUCOSE MONITOR KIT: PACK | 0 refills | Status: DC

## 2021-03-28 MED ORDER — SULFAMETHOXAZOLE-TRIMETHOPRIM 800-160 MG PO TABS
1.0000 | ORAL_TABLET | Freq: Two times a day (BID) | ORAL | Status: DC
  Administered 2021-03-28: 1 via ORAL
  Filled 2021-03-28: qty 1

## 2021-03-28 MED ORDER — INSULIN ASPART 100 UNIT/ML IJ SOLN
0.0000 [IU] | Freq: Three times a day (TID) | INTRAMUSCULAR | Status: DC
  Administered 2021-03-28 (×2): 3 [IU] via SUBCUTANEOUS
  Administered 2021-03-28: 2 [IU] via SUBCUTANEOUS

## 2021-03-28 MED ORDER — HYDROXYZINE HCL 10 MG PO TABS
10.0000 mg | ORAL_TABLET | Freq: Three times a day (TID) | ORAL | 0 refills | Status: DC
  Filled 2021-03-28: qty 30, 10d supply, fill #0

## 2021-03-28 MED ORDER — "PEN NEEDLES 3/16"" 31G X 5 MM MISC": 1.0000 | Freq: Two times a day (BID) | 2 refills | Status: DC

## 2021-03-28 MED ORDER — DILTIAZEM HCL ER COATED BEADS 180 MG PO CP24
180.0000 mg | ORAL_CAPSULE | Freq: Every day | ORAL | 1 refills | Status: DC
  Filled 2021-03-28: qty 30, 30d supply, fill #0

## 2021-03-28 MED ORDER — CEFDINIR 300 MG PO CAPS: 300.0000 mg | ORAL_CAPSULE | Freq: Two times a day (BID) | ORAL | Status: DC

## 2021-03-28 MED ORDER — INSULIN ASPART PROT & ASPART (70-30 MIX) 100 UNIT/ML PEN
12.0000 [IU] | PEN_INJECTOR | Freq: Two times a day (BID) | SUBCUTANEOUS | 11 refills | Status: DC
  Filled 2021-03-29: qty 6, 25d supply, fill #0

## 2021-03-28 MED ORDER — INSULIN ASPART PROT & ASPART (70-30 MIX) 100 UNIT/ML ~~LOC~~ SUSP
12.0000 [IU] | Freq: Two times a day (BID) | SUBCUTANEOUS | Status: DC
  Filled 2021-03-28: qty 10

## 2021-03-28 MED ORDER — BLOOD GLUCOSE MONITOR KIT
PACK | 0 refills | Status: DC
  Filled 2021-03-28: qty 1, fill #0

## 2021-03-28 MED ORDER — DILTIAZEM HCL ER COATED BEADS 180 MG PO CP24
180.0000 mg | ORAL_CAPSULE | Freq: Every day | ORAL | Status: DC
  Administered 2021-03-28: 180 mg via ORAL
  Filled 2021-03-28: qty 1

## 2021-03-28 NOTE — Plan of Care (Signed)

## 2021-03-28 NOTE — TOC Initial Note (Signed)
Transition of Care (TOC) - Initial/Assessment Note  ? ? ?Patient Details  ?Name: [name] ?MRN: [id number] ?Date of Birth: 1965-05-06 ? ?Transition of Care (TOC) CM/SW Contact:    ?[place], LCSW ?Phone Number: ?03/28/2021, 12:56 PM ? ?Clinical Narrative:                 ? ?Pt will likely DC today. Discussed medication with pt and that meds would be sent to [facility]. Pt enrolled in [facility] and informed that it will only cover Eliquis for 1 month. Pt will need to follow up with patient assistance program; Pharmacist completed application with pt and faxed to Eliquis patient assistance program. Pt does not currently have a PCP and is agreeable to appointment with community health and wellness. Appointment scheduled for 4/24 at 230pm though they will try and work pt in sooner and contact her for a sooner appointment. Instructions added to AVS.  ? ?Expected Discharge Plan: Home/Self Care ?Barriers to Discharge: Continued Medical Work up, Inadequate or no insurance ? ? ?Patient Goals and CMS Choice ?  ?  ?Choice offered to / list presented to : NA ? ?Expected Discharge Plan and Services ?Expected Discharge Plan: Home/Self Care ?In-house Referral: Clinical Social Work ?  ?Post Acute Care Choice: NA ?Living arrangements for the past 2 months: [place] ?                ?DME Arranged: N/A ?DME Agency: NA ?  ?  ?  ?  ?  ?  ?  ?  ? ?Prior Living Arrangements/Services ?Living arrangements for the past 2 months: [place] ?Lives with:: Spouse ?Patient language and need for interpreter reviewed:: Yes ?       ?Need for Family Participation in Patient Care: No (Comment) ?Care giver support system in place?: Yes (comment) ?  ?Criminal Activity/Legal Involvement Pertinent to Current Situation/Hospitalization: No - Comment as needed ? ?Activities of Daily Living ?Home Assistive Devices/Equipment: None ?ADL Screening (condition at time of admission) ?Patient's cognitive ability adequate to safely  complete daily activities?: Yes ?Is the patient deaf or have difficulty hearing?: No ?Does the patient have difficulty seeing, even when wearing glasses/contacts?: No ?Does the patient have difficulty concentrating, remembering, or making decisions?: No ?Patient able to express need for assistance with ADLs?: Yes ?Does the patient have difficulty dressing or bathing?: No ?Independently performs ADLs?: Yes (appropriate for developmental age) ?In/Out Bed: Independent ?Walks in Home: Independent ?Does the patient have difficulty walking or climbing stairs?: No ?Weakness of Legs: Both ?Weakness of Arms/Hands: Both ? ?Permission Sought/Granted ?  ?  ?   ?   ?   ?   ? ?Emotional Assessment ?  ?  ?  ?Orientation: : Oriented to Self, Oriented to Place, Oriented to  Time, Oriented to Situation ?Alcohol / Substance Use: Tobacco Use ?Psych Involvement: No (comment) ? ?Admission diagnosis:  Severe sepsis ([place]) [A41.9, R65.20] ?SIRS (systemic inflammatory response syndrome) (HCC) [R65.10] ?Patient Active Problem List  ? Diagnosis Date Noted  ? Pressure injury of skin 03/28/2021  ? SIRS (systemic inflammatory response syndrome) ([place]) 03/24/2021  ? Type 2 diabetes mellitus with hyperglycemia ([place]) 03/24/2021  ? Nausea vomiting and diarrhea 03/24/2021  ? Recurrent pulmonary embolism ([place]) 03/24/2021  ? Chest pain 03/24/2021  ? CKD (chronic kidney disease), stage III ([place]) 05/17/2016  ? Type 2 diabetes mellitus with vascular disease ([place]) 05/17/2016  ? Essential hypertension 05/17/2016  ? DVT (deep venous thrombosis) ([place]) 05/16/2016  ?  Chronic ischemic left PCA stroke 07/30/2013  ? TIA (transient ischemic attack) 07/30/2013  ? Posterior circulation stroke of uncertain pathology ([place]) 07/30/2013  ? ?PCP:  Ephriam Jenkins, PA (Inactive) ?Pharmacy:   ?[place], [place] [address] ?[place] [address] ?Phone: [phone] Fax:  [phone] ? ?[facility] ?[address] ?[place] [address] ?Phone: [phone] Fax: [phone] ? ? ? ? ?Social Determinants of Health (SDOH) Interventions ?  ? ?Readmission Risk Interventions ?No flowsheet data found. ? ? ?

## 2021-03-28 NOTE — Progress Notes (Signed)
Patient ambulated 160 feet in hallway using RW and only standby assist from RN. O2 Sats remained 95% RA and higher. Pt tolerated the walk very well. HR remained in 130s, asymptomatic. Pt denied feeling lightheaded or SOB. After education, pt also drew up Novolog 70/30 insulin 12 units into the syringe herself, and administered the medication to herself in her thigh- tolerated very well and verbalized feeling comfortable doing so. Pt also verbalized the ability to check her own CBG at home. Pt and husband made aware of Eliquis approval and insulin requirements at home.  ?

## 2021-03-28 NOTE — Discharge Summary (Signed)
?Physician Discharge Summary ?  ?Patient: [name] MRN: [id number] DOB: 1965/08/04  ?Admit date:     03/23/2021  ?Discharge date: 03/28/21  ?Discharge Physician: Nita Sells  ? ?PCP: Ephriam Jenkins, PA (Inactive)  ? ?Recommendations at discharge:  ? ?Needs Chem-12 CBC in 1 week ?Needs x-ray in 1 month ?May require oxygen see screening note from nursing ?Will need home health and insulin teaching on discharge to be consolidated ?Would recommend outpatient reevaluation of adrenal mass in about a month and a half. ?Consider lupus anticoagulant testing in the outpatient setting ? ?Discharge Diagnoses: ?Principal Problem: ?  SIRS (systemic inflammatory response syndrome) (HCC) ?Active Problems: ?  Chest pain ?  Type 2 diabetes mellitus with hyperglycemia (HCC) ?  TIA (transient ischemic attack) ?  Nausea vomiting and diarrhea ?  Recurrent pulmonary embolism ([place]) ?  Pressure injury of skin ? ?Sepsis secondary to urinary tract infection from Klebsiella with acute respiratory failure on admission ? ?Hospital Course: ?38 WF home dwelling HTN DM TY 2  prior tobacco abuse with underlying COPD ?Patient has had a saddle pulmonary embolism 02/13/2008 with RLE DVT-at the time hematology was consulted and recommended follow-up of lupus anticoagulant in 2 months ?She also has benign left adrenal adenoma which was worked up with negative hormonal work-up inclusive of testosterone estradiol and estrogen panels ?She had a CVA 07/31/2013, left PCA stroke ?Patient went on to have another DVT and this was found to be going up to the common femoral-patient was advised to continue her blood thinner ? ?Patient presented with swelling of the right side of neck/facial area nausea vomiting- ?found to be tachycardic Tmax 101.8 WBC 11.7 lactic acidosis 3.6-->2.4 CBG 526 with gap of 19 ?CTA showed large left adrenal mass which was likely felt to be a benign (see above) and showed severe emphysema ?She was also found to have a very  small 4.6 cm anterior pericardial effusion ? ?Troponins were marginally elevated and lactic acid has come down to 1.2 ? ?Eventually patient was found to have gram-negative Enterobacter bacteremia ?She developed acute hypoxic respiratory failure later in the day on 3/17 and was placed on BiPAP-I had long discussions with her family ?She improved on 3/18 and was able to come off BiPAP and tolerate fluids orally ?3/19-flipped into A-fib RVR with sinus tach subsequently and self converted-curbside cardiology ?3/20-stabilizing and transferred to progressive unit and converted to oral antibiotics ? ? ?Assessment and Plan: ?Acute hypoxic respiratory failure DDx sepsis + fluid resuscitation overload from DKA on admission ?Net goals of resuscitation are to keep fluids even as possible-she is -1.3 L ?SIRS (systemic inflammatory response syndrome) (HCC) secondary to Klebsiella ornithinolytica growing in the blood likely from a urinary source ?DDX broad-UA performed but no urine culture unfortunately ?Transition cefepime to Ancef-and have now converted to Bactrim stop date 3/25 ?Headache ?better ?Tylenol 650 every 6 as needed, can use tramadol every 12 as needed ?Moderate to severe hypokalemia ?Probably related to Lasix that was given on 3/17--magnesium close to normal ?Stop potassium replacement 3/21 ?Chest pain ?Etiology multifactorial --troponin 2/2 sepsis physiology on admission ?Echocardiogram EF 65-70% HFpEF--t no wall motion abnormality ?Paroxysmal A-fib 03/26/2021--suspect a substrate sepsis + beta agonist + hypokalemia ?Change short acting to Cardizem CD 180 ?Monitor trends on Progressive unit ?Type 2 diabetes mellitus with hyperglycemia (HCC) ?early DKA when patient was admitted on 3/16 ?Metabolic acidosis on admission ?Had anion gap acidosis of 16 and a CO2 of 14--transitioned off drip rapidly, keep volumes even-discontinue IV fluid ?  Acidosis is resolved  ?Sugars are in the 220-270 range-patient is on 15 of Levemir   ?We will transition to 70/30 insulin 12 units twice daily as this is more affordable ?Patient is very adamant about discharging home today-I tried to explain to her that it would be a good idea for her to wait a couple of days to adjust her insulins but she wants to go home ?I have called in insulin FlexPen 7030 to [facility] and also a glucometer for her ?Recurrent pulmonary embolism ([place]) ?noncompliant with anticoagulation secondary to cost--? patient assistance programs ?Now on Eliquis appreciate pharmacist and TOC working to ensure that she ?Echocardiogram shows no heart strain ?TIA (transient ischemic attack) ?Prior CVA left PCA 07/31/2013 ?Continue with Plavix ?outpatient setting from rheumatological work-up (has had some work-up in the past but may need to be tested for lupus anticoagulant in the outpatient setting) ? ? ?  ? ? ?Consultants:  ?Procedures performed:   ?Disposition: Home ?Diet recommendation:  ?Cardiac diet ?DISCHARGE MEDICATION: ?Allergies as of 03/28/2021   ? ?   Reactions  ? Amoxicillin Hives  ? Aspirin Hives  ? Codeine Hives  ? Penicillins Hives  ? Has patient had a PCN reaction causing immediate rash, facial/tongue/throat swelling, SOB or lightheadedness with hypotension: Yes ?Has patient had a PCN reaction causing severe rash involving mucus membranes or skin necrosis: No ?Has patient had a PCN reaction that required hospitalization No ?Has patient had a PCN reaction occurring within the last 10 years: Yes ?If all of the above answers are "NO", then may proceed with Cephalosporin use.  ? ?  ? ?  ?Medication List  ?  ? ?STOP taking these medications   ? ?diphenhydramine-acetaminophen 25-500 MG Tabs tablet ?Commonly known as: TYLENOL PM ?  ? ?  ? ?TAKE these medications   ? ?apixaban 5 MG Tabs tablet ?Commonly known as: Eliquis ?Take 1 tablet (5 mg total) by mouth 2 (two) times daily. ?  ?budesonide 0.5 MG/2ML nebulizer solution ?Commonly known as: PULMICORT ?Take 2 mLs (0.5 mg total) by  nebulization 2 (two) times daily. ?  ?clopidogrel 75 MG tablet ?Commonly known as: PLAVIX ?Take 1 tablet (75 mg total) by mouth daily. ?  ?diltiazem 180 MG 24 hr capsule ?Commonly known as: CARDIZEM CD ?Take 1 capsule (180 mg total) by mouth daily. ?Start taking on: March 29, 2021 ?  ?fenofibrate 160 MG tablet ?Take 1 tablet (160 mg total) by mouth daily. ?  ?hydrOXYzine 10 MG tablet ?Commonly known as: ATARAX ?Take 1 tablet (10 mg total) by mouth 3 (three) times daily. ?  ?insulin aspart protamine - aspart (70-30) 100 UNIT/ML FlexPen ?Commonly known as: NOVOLOG 70/30 MIX ?Inject 12 Units into the skin 2 (two) times daily with a meal. ?  ?levalbuterol 0.63 MG/3ML nebulizer solution ?Commonly known as: XOPENEX ?Take 3 mLs (0.63 mg total) by nebulization 2 (two) times daily. ?  ?pravastatin 20 MG tablet ?Commonly known as: PRAVACHOL ?Take 20 mg by mouth daily. ?  ?sulfamethoxazole-trimethoprim 800-160 MG tablet ?Commonly known as: BACTRIM DS ?Take 1 tablet by mouth every 12 (twelve) hours. ?  ? ?  ? ? Follow-up Information   ? ? [place]. Go on 05/01/2021.   ?Why: You have a PCP appointment at [facility] with Archie Patten on 05/01/21 at 230pm. They are going to work on getting you a sooner appointment since you will only have 30 days of medications from the hospital. They will call  you to schedule a sooner appointment when available. ?Contact information: ?[address] ?[place] [address] ?[phone] ? ?  ?  ? ?  ?  ? ?  ? ?Discharge Exam: ?Filed Weights  ? 03/23/21 1544 03/24/21 0134  ?Weight: 65.3 kg 58.4 kg  ? ?Awake coherent no distress EOMI NCAT no focal deficit no rales no rhonchi no wheeze no chills no rigors ?Abdomen soft no rebound no guarding ROM intact ?CTA B ?S1 S2 no murmur no rub no gallop psych euthymic coherent ?Acute hypoxic respiratory failure DDx sepsis + fluid resuscitation overload from DKA on admission ?Net  goals of resuscitation are to keep fluids even as possible-she is -1.3 L ?SIRS (systemic inflammatory response syndrome) (HCC) secondary to Klebsiella ornithinolytica ?DDX broad-UA performed but no urine culture un

## 2021-03-28 NOTE — TOC Benefit Eligibility Note (Signed)
Patient Advocate Encounter ? ?Patient is approved through the [facility] [facility] [place] for Eliquis through 03/28/2022. ?  ? ? ? ?Lyndel Safe, CPhT ?Pharmacy Patient Advocate Specialist ?[place] Patient Advocate Team ?Direct Number: [phone]  Fax: [phone]  ?

## 2021-03-29 ENCOUNTER — Other Ambulatory Visit (HOSPITAL_BASED_OUTPATIENT_CLINIC_OR_DEPARTMENT_OTHER): Payer: Self-pay

## 2021-03-29 ENCOUNTER — Other Ambulatory Visit (HOSPITAL_COMMUNITY): Payer: Self-pay

## 2021-03-29 MED ORDER — INSULIN PEN NEEDLE 31G X 5 MM MISC
2 refills | Status: DC
  Filled 2021-03-29: qty 100, 30d supply, fill #0

## 2021-05-01 ENCOUNTER — Encounter: Payer: Self-pay | Admitting: Nurse Practitioner

## 2021-05-01 ENCOUNTER — Ambulatory Visit: Payer: Self-pay | Attending: Nurse Practitioner | Admitting: Nurse Practitioner

## 2021-05-01 ENCOUNTER — Other Ambulatory Visit: Payer: Self-pay

## 2021-05-01 VITALS — BP 132/86 | HR 119 | Wt 124.6 lb

## 2021-05-01 DIAGNOSIS — E559 Vitamin D deficiency, unspecified: Secondary | ICD-10-CM

## 2021-05-01 DIAGNOSIS — I10 Essential (primary) hypertension: Secondary | ICD-10-CM

## 2021-05-01 DIAGNOSIS — Z1231 Encounter for screening mammogram for malignant neoplasm of breast: Secondary | ICD-10-CM

## 2021-05-01 DIAGNOSIS — Z794 Long term (current) use of insulin: Secondary | ICD-10-CM

## 2021-05-01 DIAGNOSIS — I6930 Unspecified sequelae of cerebral infarction: Secondary | ICD-10-CM

## 2021-05-01 DIAGNOSIS — Z1159 Encounter for screening for other viral diseases: Secondary | ICD-10-CM

## 2021-05-01 DIAGNOSIS — R634 Abnormal weight loss: Secondary | ICD-10-CM

## 2021-05-01 DIAGNOSIS — Z130 Encounter for screening for diseases of the blood and blood-forming organs and certain disorders involving the immune mechanism: Secondary | ICD-10-CM

## 2021-05-01 DIAGNOSIS — Z1211 Encounter for screening for malignant neoplasm of colon: Secondary | ICD-10-CM

## 2021-05-01 DIAGNOSIS — Z7689 Persons encountering health services in other specified circumstances: Secondary | ICD-10-CM

## 2021-05-01 DIAGNOSIS — J449 Chronic obstructive pulmonary disease, unspecified: Secondary | ICD-10-CM

## 2021-05-01 DIAGNOSIS — E785 Hyperlipidemia, unspecified: Secondary | ICD-10-CM

## 2021-05-01 DIAGNOSIS — F419 Anxiety disorder, unspecified: Secondary | ICD-10-CM

## 2021-05-01 DIAGNOSIS — E1165 Type 2 diabetes mellitus with hyperglycemia: Secondary | ICD-10-CM

## 2021-05-01 DIAGNOSIS — Z86711 Personal history of pulmonary embolism: Secondary | ICD-10-CM

## 2021-05-01 MED ORDER — PRAVASTATIN SODIUM 20 MG PO TABS
20.0000 mg | ORAL_TABLET | Freq: Every day | ORAL | 1 refills | Status: DC
  Filled 2021-05-01: qty 90, 90d supply, fill #0

## 2021-05-01 MED ORDER — HYDROXYZINE HCL 10 MG PO TABS
10.0000 mg | ORAL_TABLET | Freq: Three times a day (TID) | ORAL | 0 refills | Status: DC
  Filled 2021-05-01: qty 30, 10d supply, fill #0

## 2021-05-01 MED ORDER — INSULIN ASPART PROT & ASPART (70-30 MIX) 100 UNIT/ML PEN
12.0000 [IU] | PEN_INJECTOR | Freq: Two times a day (BID) | SUBCUTANEOUS | 11 refills | Status: DC
  Filled 2021-05-01: qty 15, 63d supply, fill #0

## 2021-05-01 MED ORDER — BUSPIRONE HCL 10 MG PO TABS
10.0000 mg | ORAL_TABLET | Freq: Two times a day (BID) | ORAL | 3 refills | Status: DC
  Filled 2021-05-01: qty 60, 20d supply, fill #0

## 2021-05-01 MED ORDER — TRUE METRIX BLOOD GLUCOSE TEST VI STRP
ORAL_STRIP | 12 refills | Status: DC
  Filled 2021-05-01: qty 200, fill #0

## 2021-05-01 MED ORDER — TRUEPLUS LANCETS 28G MISC
3 refills | Status: DC
  Filled 2021-05-01: qty 200, fill #0

## 2021-05-01 MED ORDER — TRUE METRIX METER W/DEVICE KIT
PACK | 0 refills | Status: DC
  Filled 2021-05-01: qty 1, fill #0

## 2021-05-01 MED ORDER — INSULIN PEN NEEDLE 31G X 5 MM MISC
6 refills | Status: DC
  Filled 2021-05-01: qty 200, fill #0

## 2021-05-01 MED ORDER — FENOFIBRATE 160 MG PO TABS
160.0000 mg | ORAL_TABLET | Freq: Every day | ORAL | 2 refills | Status: DC
  Filled 2021-05-01: qty 30, 30d supply, fill #0

## 2021-05-01 MED ORDER — FLUTICASONE FUROATE-VILANTEROL 100-25 MCG/ACT IN AEPB
1.0000 | INHALATION_SPRAY | Freq: Every day | RESPIRATORY_TRACT | 11 refills | Status: DC
  Filled 2021-05-01: qty 1, 1d supply, fill #0

## 2021-05-01 MED ORDER — APIXABAN 5 MG PO TABS
5.0000 mg | ORAL_TABLET | Freq: Two times a day (BID) | ORAL | 11 refills | Status: DC
  Filled 2021-05-01: qty 60, 30d supply, fill #0

## 2021-05-01 NOTE — Progress Notes (Signed)
? ?Assessment & Plan:  ?[name] was seen today for hospitalization follow-up. ? ?Diagnoses and all orders for this visit: ? ?Encounter to establish care ? ?Type 2 diabetes mellitus with hyperglycemia, with long-term current use of insulin (HCC) ?-     insulin aspart protamine - aspart (NOVOLOG 70/30 MIX) (70-30) 100 UNIT/ML FlexPen; Inject 12 units into the skin 2 (two) times daily with a meal. ?-     Microalbumin / creatinine urine ratio ?-     Blood Glucose Monitoring Suppl (TRUE METRIX METER) w/Device KIT; Use as instructed. Check blood glucose level by fingerstick 3 times per day. ?-     glucose blood (TRUE METRIX BLOOD GLUCOSE TEST) test strip; Use as instructed. Check blood glucose level by fingerstick 3 times per day. ?-     TRUEplus Lancets 28G MISC; Use as instructed. Check blood glucose level by fingerstick 3 times per day. ?-     Insulin Pen Needle 31G X 5 MM MISC; Use as directed for insulin injection twice daily ? ?Essential hypertension ?Continue diltiazem ?I do question compliance as there was not a recent prescription filled ?-     CMP14+EGFR ? ?Vitamin D deficiency disease ?-     VITAMIN D 25 Hydroxy (Vit-D Deficiency, Fractures) ? ?Unintentional weight loss ?-     Cancer Antigen 19-9 ?-     CBC with Differential ?-     DG Chest 2 View; Future ? ?COPD mixed type ([place]) ?Will need to refer to Pulmonology ?Patient was urged to apply for the financial assistance program.  They were instructed to inquire at the front desk about the application process for the [place] discount, orange card or other financial assistance.     ?-     fluticasone furoate-vilanterol (BREO ELLIPTA) 100-25 MCG/ACT AEPB; Inhale 1 puff into the lungs daily. NEED PASS ?-     DG Chest 2 View; Future ? ?Colon cancer screening ?-     Fecal occult blood, imunochemical([facility]) ? ?Anxiety ?-     hydrOXYzine (ATARAX) 10 MG tablet; Take 1 tablet by mouth 3 times daily. ?-     busPIRone (BUSPAR) 10 MG tablet; Take 1-1.5 tablets  (10-15 mg total) by mouth 2 (two) times daily. ? ?Need for hepatitis C screening test ?-     HCV Ab w Reflex to Quant PCR ?-     Interpretation: ? ?Breast cancer screening by mammogram ?-     MS DIGITAL SCREENING TOMO BILATERAL; Future ? ?Screening for deficiency anemia ?-     CBC with Differential ? ?Dyslipidemia, goal LDL below 70 ?-     Lipid panel ?-     pravastatin (PRAVACHOL) 20 MG tablet; Take 1 tablet (20 mg total) by mouth daily. NEED PASS ?-     fenofibrate 160 MG tablet; Take 1 tablet (160 mg total) by mouth daily. NEED PASS ? ?Chronic ischemic left PCA stroke ?-     clopidogrel (PLAVIX) 75 MG tablet; Take 1 tablet (75 mg total) by mouth daily. NEEDS PASS ? ?History of pulmonary embolism ?-     apixaban (ELIQUIS) 5 MG TABS tablet; Take 1 tablet (5 mg total) by mouth 2 (two) times daily. NEED PASS ? ? ? ?Patient has been counseled on age-appropriate routine health concerns for screening and prevention. These are reviewed and up-to-date. Referrals have been placed accordingly. Immunizations are up-to-date or declined.    ?Subjective:  ? ?Chief Complaint  ?Patient presents with  ? Hospitalization Follow-up  ? ?HPI ?[name]  D Sharlett Iles 56 y.o. female presents to office today to establish care and for hospital follow up.  ?She has a past medical history of GERD, Adrenal benign tumor, Anxiety, Depression, DM 2 poorly controlled, DVT in pregnancy and recurrent DVT, Hypertension, PE (noncompliant with eliquis secondary to cost in the past) and CVA 07-31-2013 (left PCA stroke/Plavix), COPD emphysema ([place]) ? ?She is accompanied by her daughter today. Was a previous patient of [facility], community but ran out of her medical insurance.  ? ?Notes decreased appetite since she came home from the hosptial (03-23-2021 through 03-28-2021). She also reports a 20 lb  unintentional weight loss over the past several months.  ?She will need to be referred to Pulmonology due to her COPD. It is likely her emphysema is related to her  weight loss ? ?HFU 03-23-2021 through 03-28-2021 ?Admitted with gram negative Enterobacter bacteremia. Hospital course was complicated by acute hypoxic respiratory failure requiring BIPAP, DKA, SIRS secondary to Klebsiella ornithinolytica growing in the blood likely from a urinary source ? ?DM 2 ?Diabetes is poorly controlled. Her husband administers her insulin Novolog 70/30 12 units BID. LDL not at goal. Will fill pravastatin 20 mg daily and fenofibrate 160 mg daily.  ?Lab Results  ?Component Value Date  ? HGBA1C >15.5 (H) 03/24/2021  ?  ?Lab Results  ?Component Value Date  ? LDLCALC 162 (H) 05/01/2021  ?  ?HTN ?Blood pressure is well controlled. She is taking diltiazem 180 mg daily. She is a smoker. She has been a smoker >35 years and smoking up to 1 ppd.  ?BP Readings from Last 3 Encounters:  ?05/01/21 132/86  ?03/28/21 101/66  ?10/27/20 128/77  ?  ?COPD: Patient complains of dyspnea, cough, wheezing, fatigue, and weight loss. Symptoms began several years ago. Symptoms chronic dyspnea, wheezing, and productive cough  does worsen with exertion.  Fever has been  absent . Patient currently is not on home oxygen therapy.Marland Kitchen Respiratory history: emphysema  ? ? ?Review of Systems  ?Constitutional:  Positive for weight loss. Negative for fever and malaise/fatigue.  ?HENT: Negative.  Negative for nosebleeds.   ?Eyes: Negative.  Negative for blurred vision, double vision and photophobia.  ?Respiratory:  Positive for cough, sputum production, shortness of breath and wheezing. Negative for hemoptysis.   ?Cardiovascular: Negative.  Negative for chest pain, palpitations and leg swelling.  ?Gastrointestinal: Negative.  Negative for heartburn, nausea and vomiting.  ?Genitourinary:   ?     Urinary incontinence  ?Musculoskeletal: Negative.  Negative for myalgias.  ?Neurological:  Positive for weakness. Negative for dizziness, focal weakness, seizures and headaches.  ?Psychiatric/Behavioral:  Negative for suicidal ideas. The patient  is nervous/anxious.   ? ?Past Medical History:  ?Diagnosis Date  ? Acid reflux   ? Adrenal benign tumor   ? Apparently enlarging and patient "is supposed to have it taken out"  ? Anxiety   ? Depression   ? Diabetes mellitus without complication ([place])   ? DVT (deep vein thrombosis) in pregnancy   ? Hypertension   ? PE (pulmonary embolism)   ? Stroke [facility])   ? ? ?Past Surgical History:  ?Procedure Laterality Date  ? DEBRIDEMENT AND CLOSURE WOUND Right 03/04/2019  ? 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;  Service: Orthopedics;  Laterality: Right;  needs 60 min  ? LOOP RECORDER IMPLANT N/A 03/25/2014  ? Procedure: LOOP RECORDER IMPLANT;  Surgeon: [name], MD;  Location: [facility]  CATH LAB;  Service: Cardiovascular;  Laterality: N/A;  ? NO PAST SURGERIES    ? TEE WITHOUT CARDIOVERSION N/A 03/25/2014  ? Procedure: TRANSESOPHAGEAL ECHOCARDIOGRAM (TEE);  Surgeon: [name], MD;  Location: [place];  Service: Cardiovascular;  Laterality: N/A;  ? ? ?Family History  ?Problem Relation Age of Onset  ? Heart attack Father 74  ? ? ?Social History Reviewed with no changes to be made today.  ? ?Outpatient Medications Prior to Visit  ?Medication Sig Dispense Refill  ? blood glucose meter kit and supplies KIT Use up to 4 times daily as directed. 1 each 0  ? budesonide (PULMICORT) 0.5 MG/2ML nebulizer solution Use 1 vial (2 mLs)  by nebulization 2  times daily. 60 mL 12  ? diltiazem (CARDIZEM CD) 180 MG 24 hr capsule Take 1 capsule by mouth daily. 30 capsule 1  ? Insulin Pen Needle (PEN NEEDLES 3/16") 31G X 5 MM MISC 1 each by Does not apply route 2 (two) times daily. 60 each 2  ? levalbuterol (XOPENEX) 0.63 MG/3ML nebulizer solution Use 1 vial (3 mLs) by nebulization 2 times daily. 75 mL 12  ? apixaban (ELIQUIS) 5 MG TABS tablet Take 1 tablet (5 mg total) by mouth 2 (two) times daily. 60 tablet 11  ? clopidogrel (PLAVIX)  75 MG tablet Take 1 tablet (75 mg total) by mouth daily. 30 tablet 0  ? fenofibrate 160 MG tablet Take 1 tablet (160 mg total) by mouth daily. 30 tablet 2  ? hydrOXYzine (ATARAX) 10 MG tablet Take 1 tab

## 2021-05-02 ENCOUNTER — Encounter: Payer: Self-pay | Admitting: Nurse Practitioner

## 2021-05-02 LAB — MICROALBUMIN / CREATININE URINE RATIO
Creatinine, Urine: 36.8 mg/dL
Microalb/Creat Ratio: 113 mg/g creat — ABNORMAL HIGH (ref 0–29)
Microalbumin, Urine: 41.5 ug/mL

## 2021-05-02 LAB — CMP14+EGFR
ALT: 7 IU/L (ref 0–32)
AST: 7 IU/L (ref 0–40)
Albumin/Globulin Ratio: 1.5 (ref 1.2–2.2)
Albumin: 4.4 g/dL (ref 3.8–4.9)
Alkaline Phosphatase: 113 IU/L (ref 44–121)
BUN/Creatinine Ratio: 11 (ref 9–23)
BUN: 8 mg/dL (ref 6–24)
Bilirubin Total: 0.3 mg/dL (ref 0.0–1.2)
CO2: 23 mmol/L (ref 20–29)
Calcium: 9.6 mg/dL (ref 8.7–10.2)
Chloride: 95 mmol/L — ABNORMAL LOW (ref 96–106)
Creatinine, Ser: 0.71 mg/dL (ref 0.57–1.00)
Globulin, Total: 3 g/dL (ref 1.5–4.5)
Glucose: 384 mg/dL — ABNORMAL HIGH (ref 70–99)
Potassium: 5 mmol/L (ref 3.5–5.2)
Sodium: 134 mmol/L (ref 134–144)
Total Protein: 7.4 g/dL (ref 6.0–8.5)
eGFR: 99 mL/min/{1.73_m2} (ref >59)

## 2021-05-02 LAB — CBC WITH DIFFERENTIAL/PLATELET
Basophils Absolute: 0.1 10*3/uL (ref 0.0–0.2)
Basos: 1 %
EOS (ABSOLUTE): 0.2 10*3/uL (ref 0.0–0.4)
Eos: 2 %
Hematocrit: 46.3 % (ref 34.0–46.6)
Hemoglobin: 15.1 g/dL (ref 11.1–15.9)
Immature Grans (Abs): 0 10*3/uL (ref 0.0–0.1)
Immature Granulocytes: 0 %
Lymphocytes Absolute: 4 10*3/uL — ABNORMAL HIGH (ref 0.7–3.1)
Lymphs: 37 %
MCH: 31.6 pg (ref 26.6–33.0)
MCHC: 32.6 g/dL (ref 31.5–35.7)
MCV: 97 fL (ref 79–97)
Monocytes Absolute: 0.4 10*3/uL (ref 0.1–0.9)
Monocytes: 4 %
Neutrophils Absolute: 6.3 10*3/uL (ref 1.4–7.0)
Neutrophils: 56 %
Platelets: 304 10*3/uL (ref 150–450)
RBC: 4.78 x10E6/uL (ref 3.77–5.28)
RDW: 13.5 % (ref 11.7–15.4)
WBC: 11 10*3/uL — ABNORMAL HIGH (ref 3.4–10.8)

## 2021-05-02 LAB — LIPID PANEL
Chol/HDL Ratio: 6 ratio — ABNORMAL HIGH (ref 0.0–4.4)
Cholesterol, Total: 248 mg/dL — ABNORMAL HIGH (ref 100–199)
HDL: 41 mg/dL (ref >39)
LDL Chol Calc (NIH): 162 mg/dL — ABNORMAL HIGH (ref 0–99)
Triglycerides: 240 mg/dL — ABNORMAL HIGH (ref 0–149)
VLDL Cholesterol Cal: 45 mg/dL — ABNORMAL HIGH (ref 5–40)

## 2021-05-02 LAB — VITAMIN D 25 HYDROXY (VIT D DEFICIENCY, FRACTURES): Vit D, 25-Hydroxy: 7.9 ng/mL — ABNORMAL LOW (ref 30.0–100.0)

## 2021-05-02 LAB — CANCER ANTIGEN 19-9: CA 19-9: 26 U/mL (ref 0–35)

## 2021-05-02 LAB — HCV AB W REFLEX TO QUANT PCR: HCV Ab: NONREACTIVE

## 2021-05-02 LAB — HCV INTERPRETATION

## 2021-05-02 MED ORDER — CLOPIDOGREL BISULFATE 75 MG PO TABS
75.0000 mg | ORAL_TABLET | Freq: Every day | ORAL | 3 refills | Status: DC
  Filled 2021-05-02: qty 30, 30d supply, fill #0

## 2021-05-02 MED ORDER — VITAMIN D (ERGOCALCIFEROL) 1.25 MG (50000 UNIT) PO CAPS
50000.0000 [IU] | ORAL_CAPSULE | ORAL | 1 refills | Status: DC
  Filled 2021-05-02: qty 12, 84d supply, fill #0

## 2021-05-03 ENCOUNTER — Other Ambulatory Visit: Payer: Self-pay

## 2021-05-10 ENCOUNTER — Other Ambulatory Visit: Payer: Self-pay

## 2022-01-12 ENCOUNTER — Other Ambulatory Visit (HOSPITAL_COMMUNITY): Payer: Self-pay

## 2022-02-09 ENCOUNTER — Other Ambulatory Visit: Payer: Self-pay

## 2022-03-20 ENCOUNTER — Other Ambulatory Visit: Payer: Self-pay

## 2022-03-20 ENCOUNTER — Encounter (HOSPITAL_BASED_OUTPATIENT_CLINIC_OR_DEPARTMENT_OTHER): Payer: Self-pay

## 2022-03-20 ENCOUNTER — Emergency Department (HOSPITAL_BASED_OUTPATIENT_CLINIC_OR_DEPARTMENT_OTHER): Payer: Medicaid Other

## 2022-03-20 ENCOUNTER — Inpatient Hospital Stay (HOSPITAL_BASED_OUTPATIENT_CLINIC_OR_DEPARTMENT_OTHER)
Admission: EM | Admit: 2022-03-20 | Discharge: 2022-03-22 | DRG: 871 | Disposition: A | Discharge status: Home Health | Payer: Medicaid Other | Attending: Internal Medicine | Admitting: Internal Medicine

## 2022-03-20 DIAGNOSIS — J168 Pneumonia due to other specified infectious organisms: Secondary | ICD-10-CM | POA: Diagnosis not present

## 2022-03-20 DIAGNOSIS — Z7901 Long term (current) use of anticoagulants: Secondary | ICD-10-CM

## 2022-03-20 DIAGNOSIS — A419 Sepsis, unspecified organism: Secondary | ICD-10-CM | POA: Diagnosis not present

## 2022-03-20 DIAGNOSIS — R109 Unspecified abdominal pain: Secondary | ICD-10-CM | POA: Diagnosis not present

## 2022-03-20 DIAGNOSIS — Z5986 Financial insecurity: Secondary | ICD-10-CM

## 2022-03-20 DIAGNOSIS — R29818 Other symptoms and signs involving the nervous system: Secondary | ICD-10-CM | POA: Diagnosis not present

## 2022-03-20 DIAGNOSIS — Z7902 Long term (current) use of antithrombotics/antiplatelets: Secondary | ICD-10-CM

## 2022-03-20 DIAGNOSIS — E1151 Type 2 diabetes mellitus with diabetic peripheral angiopathy without gangrene: Secondary | ICD-10-CM | POA: Diagnosis present

## 2022-03-20 DIAGNOSIS — I63411 Cerebral infarction due to embolism of right middle cerebral artery: Secondary | ICD-10-CM | POA: Diagnosis present

## 2022-03-20 DIAGNOSIS — Z91120 Patient's intentional underdosing of medication regimen due to financial hardship: Secondary | ICD-10-CM

## 2022-03-20 DIAGNOSIS — Z8249 Family history of ischemic heart disease and other diseases of the circulatory system: Secondary | ICD-10-CM

## 2022-03-20 DIAGNOSIS — Z8616 Personal history of COVID-19: Secondary | ICD-10-CM

## 2022-03-20 DIAGNOSIS — Z79899 Other long term (current) drug therapy: Secondary | ICD-10-CM

## 2022-03-20 DIAGNOSIS — Z885 Allergy status to narcotic agent status: Secondary | ICD-10-CM

## 2022-03-20 DIAGNOSIS — R233 Spontaneous ecchymoses: Secondary | ICD-10-CM | POA: Diagnosis present

## 2022-03-20 DIAGNOSIS — N390 Urinary tract infection, site not specified: Secondary | ICD-10-CM | POA: Diagnosis not present

## 2022-03-20 DIAGNOSIS — E279 Disorder of adrenal gland, unspecified: Secondary | ICD-10-CM | POA: Diagnosis present

## 2022-03-20 DIAGNOSIS — Z886 Allergy status to analgesic agent status: Secondary | ICD-10-CM

## 2022-03-20 DIAGNOSIS — R4781 Slurred speech: Secondary | ICD-10-CM | POA: Diagnosis present

## 2022-03-20 DIAGNOSIS — E1142 Type 2 diabetes mellitus with diabetic polyneuropathy: Secondary | ICD-10-CM | POA: Diagnosis present

## 2022-03-20 DIAGNOSIS — G9389 Other specified disorders of brain: Secondary | ICD-10-CM | POA: Diagnosis present

## 2022-03-20 DIAGNOSIS — I10 Essential (primary) hypertension: Secondary | ICD-10-CM | POA: Diagnosis present

## 2022-03-20 DIAGNOSIS — J439 Emphysema, unspecified: Secondary | ICD-10-CM | POA: Diagnosis not present

## 2022-03-20 DIAGNOSIS — J189 Pneumonia, unspecified organism: Principal | ICD-10-CM

## 2022-03-20 DIAGNOSIS — I480 Paroxysmal atrial fibrillation: Secondary | ICD-10-CM | POA: Diagnosis present

## 2022-03-20 DIAGNOSIS — K219 Gastro-esophageal reflux disease without esophagitis: Secondary | ICD-10-CM | POA: Diagnosis present

## 2022-03-20 DIAGNOSIS — R000 Tachycardia, unspecified: Secondary | ICD-10-CM | POA: Diagnosis not present

## 2022-03-20 DIAGNOSIS — Z880 Allergy status to penicillin: Secondary | ICD-10-CM

## 2022-03-20 DIAGNOSIS — Z1152 Encounter for screening for COVID-19: Secondary | ICD-10-CM

## 2022-03-20 DIAGNOSIS — Z86711 Personal history of pulmonary embolism: Secondary | ICD-10-CM

## 2022-03-20 DIAGNOSIS — E785 Hyperlipidemia, unspecified: Secondary | ICD-10-CM | POA: Diagnosis present

## 2022-03-20 DIAGNOSIS — Z7951 Long term (current) use of inhaled steroids: Secondary | ICD-10-CM

## 2022-03-20 DIAGNOSIS — F419 Anxiety disorder, unspecified: Secondary | ICD-10-CM | POA: Diagnosis present

## 2022-03-20 DIAGNOSIS — G459 Transient cerebral ischemic attack, unspecified: Secondary | ICD-10-CM

## 2022-03-20 DIAGNOSIS — R29700 NIHSS score 0: Secondary | ICD-10-CM | POA: Diagnosis present

## 2022-03-20 DIAGNOSIS — E1165 Type 2 diabetes mellitus with hyperglycemia: Secondary | ICD-10-CM | POA: Diagnosis present

## 2022-03-20 DIAGNOSIS — Z66 Do not resuscitate: Secondary | ICD-10-CM | POA: Diagnosis present

## 2022-03-20 DIAGNOSIS — F17210 Nicotine dependence, cigarettes, uncomplicated: Secondary | ICD-10-CM | POA: Diagnosis present

## 2022-03-20 DIAGNOSIS — Z794 Long term (current) use of insulin: Secondary | ICD-10-CM

## 2022-03-20 DIAGNOSIS — T45516A Underdosing of anticoagulants, initial encounter: Secondary | ICD-10-CM | POA: Diagnosis present

## 2022-03-20 DIAGNOSIS — Z8673 Personal history of transient ischemic attack (TIA), and cerebral infarction without residual deficits: Secondary | ICD-10-CM

## 2022-03-20 DIAGNOSIS — Z86718 Personal history of other venous thrombosis and embolism: Secondary | ICD-10-CM

## 2022-03-20 DIAGNOSIS — F32A Depression, unspecified: Secondary | ICD-10-CM | POA: Diagnosis present

## 2022-03-20 LAB — URINALYSIS, W/ REFLEX TO CULTURE (INFECTION SUSPECTED)
Bilirubin Urine: NEGATIVE
Glucose, UA: >=500 mg/dL — AB
Ketones, ur: NEGATIVE mg/dL
Nitrite: NEGATIVE
Protein, ur: 30 mg/dL — AB
Specific Gravity, Urine: 1.015 (ref 1.005–1.030)
pH: 6 (ref 5.0–8.0)

## 2022-03-20 LAB — CBC
HCT: 39.6 % (ref 36.0–46.0)
Hemoglobin: 13.1 g/dL (ref 12.0–15.0)
MCH: 31.3 pg (ref 26.0–34.0)
MCHC: 33.1 g/dL (ref 30.0–36.0)
MCV: 94.7 fL (ref 80.0–100.0)
Platelets: 260 10*3/uL (ref 150–400)
RBC: 4.18 MIL/uL (ref 3.87–5.11)
RDW: 13.6 % (ref 11.5–15.5)
WBC: 19.2 10*3/uL — ABNORMAL HIGH (ref 4.0–10.5)
nRBC: 0 % (ref 0.0–0.2)

## 2022-03-20 LAB — CBC WITH DIFFERENTIAL/PLATELET
Abs Immature Granulocytes: 0.07 10*3/uL (ref 0.00–0.07)
Basophils Absolute: 0.1 10*3/uL (ref 0.0–0.1)
Basophils Relative: 0 %
Eosinophils Absolute: 0.2 10*3/uL (ref 0.0–0.5)
Eosinophils Relative: 1 %
HCT: 44.8 % (ref 36.0–46.0)
Hemoglobin: 15.2 g/dL — ABNORMAL HIGH (ref 12.0–15.0)
Immature Granulocytes: 0 %
Lymphocytes Relative: 15 %
Lymphs Abs: 2.7 10*3/uL (ref 0.7–4.0)
MCH: 32 pg (ref 26.0–34.0)
MCHC: 33.9 g/dL (ref 30.0–36.0)
MCV: 94.3 fL (ref 80.0–100.0)
Monocytes Absolute: 0.8 10*3/uL (ref 0.1–1.0)
Monocytes Relative: 4 %
Neutro Abs: 14.3 10*3/uL — ABNORMAL HIGH (ref 1.7–7.7)
Neutrophils Relative %: 80 %
Platelets: 298 10*3/uL (ref 150–400)
RBC: 4.75 MIL/uL (ref 3.87–5.11)
RDW: 13.6 % (ref 11.5–15.5)
WBC: 18.1 10*3/uL — ABNORMAL HIGH (ref 4.0–10.5)
nRBC: 0 % (ref 0.0–0.2)

## 2022-03-20 LAB — RESP PANEL BY RT-PCR (RSV, FLU A&B, COVID)  RVPGX2
Influenza A by PCR: NEGATIVE
Influenza B by PCR: NEGATIVE
Resp Syncytial Virus by PCR: NEGATIVE
SARS Coronavirus 2 by RT PCR: NEGATIVE

## 2022-03-20 LAB — I-STAT VENOUS BLOOD GAS, ED
Acid-Base Excess: 2 mmol/L (ref 0.0–2.0)
Bicarbonate: 24.2 mmol/L (ref 20.0–28.0)
Calcium, Ion: 1.03 mmol/L — ABNORMAL LOW (ref 1.15–1.40)
HCT: 47 % — ABNORMAL HIGH (ref 36.0–46.0)
Hemoglobin: 16 g/dL — ABNORMAL HIGH (ref 12.0–15.0)
O2 Saturation: 66 %
Patient temperature: 98.8
Potassium: 4.9 mmol/L (ref 3.5–5.1)
Sodium: 133 mmol/L — ABNORMAL LOW (ref 135–145)
TCO2: 25 mmol/L (ref 22–32)
pCO2, Ven: 32.1 mmHg — ABNORMAL LOW (ref 44–60)
pH, Ven: 7.486 — ABNORMAL HIGH (ref 7.25–7.43)
pO2, Ven: 31 mmHg — CL (ref 32–45)

## 2022-03-20 LAB — BASIC METABOLIC PANEL
Anion gap: 11 (ref 5–15)
BUN: 14 mg/dL (ref 6–20)
CO2: 22 mmol/L (ref 22–32)
Calcium: 9 mg/dL (ref 8.9–10.3)
Chloride: 97 mmol/L — ABNORMAL LOW (ref 98–111)
Creatinine, Ser: 0.81 mg/dL (ref 0.44–1.00)
GFR, Estimated: >60 mL/min (ref >60)
Glucose, Bld: 313 mg/dL — ABNORMAL HIGH (ref 70–99)
Potassium: 3.7 mmol/L (ref 3.5–5.1)
Sodium: 130 mmol/L — ABNORMAL LOW (ref 135–145)

## 2022-03-20 LAB — CBG MONITORING, ED
Glucose-Capillary: 291 mg/dL — ABNORMAL HIGH (ref 70–99)
Glucose-Capillary: 391 mg/dL — ABNORMAL HIGH (ref 70–99)

## 2022-03-20 LAB — LACTIC ACID, PLASMA: Lactic Acid, Venous: 1.7 mmol/L (ref 0.5–1.9)

## 2022-03-20 LAB — HEPATIC FUNCTION PANEL
ALT: 11 U/L (ref 0–44)
AST: 12 U/L — ABNORMAL LOW (ref 15–41)
Albumin: 3.5 g/dL (ref 3.5–5.0)
Alkaline Phosphatase: 94 U/L (ref 38–126)
Bilirubin, Direct: <0.1 mg/dL (ref 0.0–0.2)
Total Bilirubin: 0.6 mg/dL (ref 0.3–1.2)
Total Protein: 7.1 g/dL (ref 6.5–8.1)

## 2022-03-20 LAB — TROPONIN I (HIGH SENSITIVITY)
Troponin I (High Sensitivity): 5 ng/L (ref <18)
Troponin I (High Sensitivity): 3 ng/L (ref <18)

## 2022-03-20 LAB — BRAIN NATRIURETIC PEPTIDE: B Natriuretic Peptide: 46.9 pg/mL (ref 0.0–100.0)

## 2022-03-20 MED ORDER — DIPHENHYDRAMINE HCL 12.5 MG/5ML PO ELIX: 12.5000 mg | ORAL_SOLUTION | Freq: Once | ORAL | Status: DC

## 2022-03-20 MED ORDER — VANCOMYCIN HCL IN DEXTROSE 1-5 GM/200ML-% IV SOLN: 1000.0000 mg | INTRAVENOUS | Status: DC

## 2022-03-20 MED ORDER — DIPHENHYDRAMINE HCL 50 MG/ML IJ SOLN
12.5000 mg | Freq: Once | INTRAMUSCULAR | Status: AC
  Administered 2022-03-20: 12.5 mg via INTRAVENOUS
  Filled 2022-03-20: qty 1

## 2022-03-20 MED ORDER — SODIUM CHLORIDE 0.9 % IV SOLN
2.0000 g | Freq: Once | INTRAVENOUS | Status: AC
  Administered 2022-03-20: 2 g via INTRAVENOUS
  Filled 2022-03-20: qty 12.5

## 2022-03-20 MED ORDER — SODIUM CHLORIDE 0.9 % IV SOLN
2.0000 g | Freq: Three times a day (TID) | INTRAVENOUS | Status: DC
  Administered 2022-03-21 – 2022-03-22 (×5): 2 g via INTRAVENOUS
  Filled 2022-03-20 (×5): qty 12.5

## 2022-03-20 MED ORDER — INSULIN ASPART 100 UNIT/ML IJ SOLN
0.0000 [IU] | Freq: Every day | INTRAMUSCULAR | Status: DC
  Administered 2022-03-20: 5 [IU] via SUBCUTANEOUS

## 2022-03-20 MED ORDER — INSULIN ASPART 100 UNIT/ML IJ SOLN: 0.0000 [IU] | Freq: Three times a day (TID) | INTRAMUSCULAR | Status: DC

## 2022-03-20 MED ORDER — METRONIDAZOLE 500 MG/100ML IV SOLN
500.0000 mg | Freq: Once | INTRAVENOUS | Status: AC
  Administered 2022-03-20: 500 mg via INTRAVENOUS
  Filled 2022-03-20: qty 100

## 2022-03-20 MED ORDER — ACETAMINOPHEN 325 MG PO TABS
650.0000 mg | ORAL_TABLET | Freq: Once | ORAL | Status: AC
  Administered 2022-03-20: 650 mg via ORAL
  Filled 2022-03-20: qty 2

## 2022-03-20 MED ORDER — LACTATED RINGERS IV BOLUS
1000.0000 mL | Freq: Once | INTRAVENOUS | Status: AC
  Administered 2022-03-20: 1000 mL via INTRAVENOUS

## 2022-03-20 MED ORDER — VANCOMYCIN HCL IN DEXTROSE 1-5 GM/200ML-% IV SOLN
1000.0000 mg | Freq: Once | INTRAVENOUS | Status: AC
  Administered 2022-03-20: 1000 mg via INTRAVENOUS
  Filled 2022-03-20: qty 200

## 2022-03-20 MED ORDER — SODIUM CHLORIDE 0.9 % IV SOLN
500.0000 mg | INTRAVENOUS | Status: DC
  Administered 2022-03-20: 500 mg via INTRAVENOUS
  Filled 2022-03-20: qty 5

## 2022-03-20 MED ORDER — IOHEXOL 350 MG/ML SOLN
100.0000 mL | Freq: Once | INTRAVENOUS | Status: AC | PRN
  Administered 2022-03-20: 100 mL via INTRAVENOUS

## 2022-03-20 NOTE — ED Notes (Signed)
Checked CBG 391, RN Tanesha informed. Pt states she hasn't had the prescription or the money to get her meds for several months.

## 2022-03-20 NOTE — ED Provider Notes (Signed)
  Physical Exam  BP (!) 140/88 (BP Location: Left Arm)   Pulse (!) 111   Temp 98.9 F (37.2 C) (Oral)   Resp 20   Ht 5\' 6"  (1.676 m)   Wt 54 kg   LMP  (LMP Unknown) Comment: "a few months"  SpO2 99%   BMI 19.21 kg/m   Physical Exam Cardiovascular:     Rate and Rhythm: Tachycardia present.  Pulmonary:     Effort: Pulmonary effort is normal.     Comments: O2 sat 100 on RA Neurological:     Mental Status: She is alert.     Procedures  .Critical Care  Performed by: Elgie Congo, MD Authorized by: Elgie Congo, MD   Critical care provider statement:    Critical care time (minutes):  30   Critical care was necessary to treat or prevent imminent or life-threatening deterioration of the following conditions:  Sepsis   Critical care was time spent personally by me on the following activities:  Development of treatment plan with patient or surrogate, discussions with consultants, evaluation of patient's response to treatment, examination of patient, ordering and review of laboratory studies, ordering and review of radiographic studies, ordering and performing treatments and interventions, pulse oximetry, re-evaluation of patient's condition, review of old charts and obtaining history from patient or surrogate   Care discussed with: admitting provider     ED Course / MDM   Clinical Course as of 03/20/22 2053  Tue Mar 20, 2022  1510 Pulse Rate(!): 126 [JL]  1510 Temp: 98.4 F (36.9 C) [JL]  1510 SpO2: 99 % [JL]  1540 Lactic Acid, Venous: 1.7 [JL]  1540 WBC(!): 18.1 [JL]  1608 42 F with pmh PE/DVT, DM, CVA noncompliant with Eliquis here with CP/SOB, 100 lb weight loss over the past year. Episode of near syncope and slurred speech as well today no seizure activity. Getting sepsis antibiotics, follow up CTA PE, CTH, CTAP with contrast. No O2 requirement. Admit. [VB]  7062 CTA PE study with tree-in-bud nodules concerning for pneumonia no PE.  CTAP with adrenal nodule.  CT  head unremarkable.  Patient getting broad-spectrum antibiotics.  Started with 1 L IV fluids due to history of grade 1 diastolic dysfunction.  Troponin pending. [VB]  1756 Trop 5/rpt 3, doubt ACS. Spoke with Dr Roosevelt Locks, will admit for continued antibiotics for concern for sepsis secondary to atypical pneumonia and UTI. [VB]  2053 Patient has itching while getting azithromycin.  No wheezing no vomiting no hypotension.  She says she has itching all over however there are no hives or urticaria.  No signs of anaphylaxis.  There is an abnormal petechial rash on her arm ordered repeat CBC.  Benadryl ordered. [VB]    Clinical Course User Index [JL] Regan Lemming, MD [VB] Elgie Congo, MD   Medical Decision Making Amount and/or Complexity of Data Reviewed Labs: ordered. Decision-making details documented in ED Course. Radiology: ordered.  Risk OTC drugs. Prescription drug management. Decision regarding hospitalization.          Elgie Congo, MD 03/20/22 [phone]

## 2022-03-20 NOTE — ED Notes (Signed)
Pt voided into bedside commode, md notified and approved urine sample from bed pan, pt had we depends, changed depends and cleaned pt.

## 2022-03-20 NOTE — ED Notes (Addendum)
Pt called out with c/o rash on right arm and itching all over her body. Stopped azithromycin, MD Nechama Guard made aware. See new orders.

## 2022-03-20 NOTE — Progress Notes (Signed)
Pharmacy Antibiotic Note  [name] is a 57 y.o. female admitted on 03/20/2022 with sepsis. Pharmacy has been consulted for vancomycin and cefepime dosing. Pt is afebrile but WBC is elevated at 18.1. Scr is WNL and lactic acid is <2.   Plan: Vancomycin 1g IV Q24h Cefepime 2g IV Q8H F/u renal fxn, C&S, clinical status and peak/trough at SS  Height: '5\' 6"'$  (167.6 cm) Weight: 54 kg (119 lb) IBW/kg (Calculated) : 59.3  Temp (24hrs), Avg:98.6 F (37 C), Min:98.4 F (36.9 C), Max:98.8 F (37.1 C)  Recent Labs  Lab 03/20/22 1504 03/20/22 1507  WBC  --  18.1*  CREATININE  --  0.81  LATICACIDVEN 1.7  --     Estimated Creatinine Clearance: 64.5 mL/min (by C-G formula based on SCr of 0.81 mg/dL).    Allergies  Allergen Reactions   Amoxicillin Hives   Aspirin Hives   Codeine Hives   Penicillins Hives    Has patient had a PCN reaction causing immediate rash, facial/tongue/throat swelling, SOB or lightheadedness with hypotension: Yes Has patient had a PCN reaction causing severe rash involving mucus membranes or skin necrosis: No Has patient had a PCN reaction that required hospitalization No Has patient had a PCN reaction occurring within the last 10 years: Yes If all of the above answers are "NO", then may proceed with Cephalosporin use.     Antimicrobials this admission: Vanc 3/12>> Cefepime 3/12>> Flagyl x 1 3/12  Dose adjustments this admission: N/A  Microbiology results: Pending  Thank you for allowing pharmacy to be a part of this patient's care.  Krissa Utke, Rande Lawman 03/20/2022 3:46 PM

## 2022-03-20 NOTE — Sepsis Progress Note (Signed)
Code Sepsis protocol being monitored by elink 

## 2022-03-20 NOTE — ED Provider Notes (Signed)
[name] Provider Note   CSN: [id number] Arrival date & time: 03/20/22  1425     History  Chief Complaint  Patient presents with   Dizziness    [name] is a 57 y.o. female.   Dizziness Associated symptoms: chest pain, headaches and shortness of breath      57 year old female with medical history significant for recurrent PE had been prescribed Eliquis but has been noncompliant due to financial issues, prior CVA who presents to the emergency department with sudden onset chest pain, shortness of breath, productive cough since yesterday.  Patient recently got over COVID infection in the past month.  She has not been compliant with her Eliquis for the last several months.  He endorses pleuritic chest discomfort on the right side and substernally that radiates to her back.  She endorses mild shortness of breath.  She denies any hemoptysis.  Earlier today, she had an episode of lightheadedness and slurred speech which lasted around 1 hour.  She was lightheaded and had an episode of near syncope earlier today, no seizure activity, was talking during the episode.  She has been endorsing a headache posteriorly.  No facial droop, no new weakness or numbness.  Home Medications Prior to Admission medications   Medication Sig Start Date End Date Taking? Authorizing Provider  apixaban (ELIQUIS) 5 MG TABS tablet Take 1 tablet (5 mg total) by mouth 2 (two) times daily. NEED PASS 05/01/21   Gildardo Pounds, NP  blood glucose meter kit and supplies KIT Use up to 4 times daily as directed. 03/28/21   Nita Sells, MD  Blood Glucose Monitoring Suppl (TRUE METRIX METER) w/Device KIT Use as instructed. Check blood glucose level by fingerstick 3 times per day. 05/01/21   Gildardo Pounds, NP  budesonide (PULMICORT) 0.5 MG/2ML nebulizer solution Use 1 vial (2 mLs)  by nebulization 2  times daily. 03/28/21   Nita Sells, MD  busPIRone  (BUSPAR) 10 MG tablet Take 1-1.5 tablets (10-15 mg total) by mouth 2 (two) times daily. 05/01/21   Gildardo Pounds, NP  clopidogrel (PLAVIX) 75 MG tablet Take 1 tablet (75 mg total) by mouth daily. NEEDS PASS 05/02/21   Gildardo Pounds, NP  diltiazem (CARDIZEM CD) 180 MG 24 hr capsule Take 1 capsule by mouth daily. 03/29/21   Nita Sells, MD  fenofibrate 160 MG tablet Take 1 tablet (160 mg total) by mouth daily. NEED PASS 05/01/21   Gildardo Pounds, NP  fluticasone furoate-vilanterol (BREO ELLIPTA) 100-25 MCG/ACT AEPB Inhale 1 puff into the lungs daily. NEED PASS 05/01/21   Gildardo Pounds, NP  glucose blood (TRUE METRIX BLOOD GLUCOSE TEST) test strip Use as instructed. Check blood glucose level by fingerstick 3 times per day. 05/01/21   Gildardo Pounds, NP  hydrOXYzine (ATARAX) 10 MG tablet Take 1 tablet by mouth 3 times daily. 05/01/21   Gildardo Pounds, NP  insulin aspart protamine - aspart (NOVOLOG 70/30 MIX) (70-30) 100 UNIT/ML FlexPen Inject 12 units into the skin 2 (two) times daily with a meal. 05/01/21   Gildardo Pounds, NP  Insulin Pen Needle (PEN NEEDLES 3/16") 31G X 5 MM MISC 1 each by Does not apply route 2 (two) times daily. 03/28/21   Nita Sells, MD  Insulin Pen Needle 31G X 5 MM MISC Use as directed for insulin injection twice daily 05/01/21   Gildardo Pounds, NP  levalbuterol Penne Lash) 0.63 MG/3ML nebulizer solution Use  1 vial (3 mLs) by nebulization 2 times daily. 03/28/21   Nita Sells, MD  pravastatin (PRAVACHOL) 20 MG tablet Take 1 tablet (20 mg total) by mouth daily. NEED PASS 05/01/21   Gildardo Pounds, NP  TRUEplus Lancets 28G MISC Use as instructed. Check blood glucose level by fingerstick 3 times per day. 05/01/21   Gildardo Pounds, NP  Vitamin D, Ergocalciferol, (DRISDOL) 1.25 MG (50000 UNIT) CAPS capsule Take 1 capsule (50,000 Units total) by mouth every 7 (seven) days. 05/02/21   Gildardo Pounds, NP      Allergies    Amoxicillin, Aspirin,  Codeine, and Penicillins    Review of Systems   Review of Systems  Unable to perform ROS: Acuity of condition  Respiratory:  Positive for cough and shortness of breath.   Cardiovascular:  Positive for chest pain.  Neurological:  Positive for light-headedness and headaches.    Physical Exam Updated Vital Signs BP (!) 140/88 (BP Location: Left Arm)   Pulse (!) 111   Temp 98.9 F (37.2 C) (Oral)   Resp 20   Ht '5\' 6"'$  (1.676 m)   Wt 54 kg   LMP  (LMP Unknown) Comment: "a few months"  SpO2 99%   BMI 19.21 kg/m  Physical Exam Vitals and nursing note reviewed.  Constitutional:      General: She is not in acute distress.    Appearance: She is well-developed.  HENT:     Head: Normocephalic and atraumatic.  Eyes:     Conjunctiva/sclera: Conjunctivae normal.  Cardiovascular:     Rate and Rhythm: Regular rhythm. Tachycardia present.  Pulmonary:     Effort: Pulmonary effort is normal. No respiratory distress.     Breath sounds: Normal breath sounds.  Abdominal:     Palpations: Abdomen is soft.     Tenderness: There is no abdominal tenderness.  Musculoskeletal:        General: No swelling.     Cervical back: Neck supple.  Skin:    General: Skin is warm and dry.     Capillary Refill: Capillary refill takes less than 2 seconds.  Neurological:     General: No focal deficit present.     Mental Status: She is alert. She is disoriented.     Cranial Nerves: No cranial nerve deficit.     Sensory: No sensory deficit.     Motor: No weakness.     Comments: No cranial nerve deficit, 5 out of 5 strength in all 4 extremities, intact sensation to light touch  Psychiatric:        Mood and Affect: Mood normal.     ED Results / Procedures / Treatments   Labs (all labs ordered are listed, but only abnormal results are displayed) Labs Reviewed  BASIC METABOLIC PANEL - Abnormal; Notable for the following components:      Result Value   Sodium 130 (*)    Chloride 97 (*)    Glucose, Bld  313 (*)    All other components within normal limits  CBC WITH DIFFERENTIAL/PLATELET - Abnormal; Notable for the following components:   WBC 18.1 (*)    Hemoglobin 15.2 (*)    Neutro Abs 14.3 (*)    All other components within normal limits  CBG MONITORING, ED - Abnormal; Notable for the following components:   Glucose-Capillary 291 (*)    All other components within normal limits  I-STAT VENOUS BLOOD GAS, ED - Abnormal; Notable for the following components:   pH,  Ven 7.486 (*)    pCO2, Ven 32.1 (*)    pO2, Ven 31 (*)    Sodium 133 (*)    Calcium, Ion 1.03 (*)    HCT 47.0 (*)    Hemoglobin 16.0 (*)    All other components within normal limits  CULTURE, BLOOD (ROUTINE X 2)  CULTURE, BLOOD (ROUTINE X 2)  RESP PANEL BY RT-PCR (RSV, FLU A&B, COVID)  RVPGX2  LACTIC ACID, PLASMA  BRAIN NATRIURETIC PEPTIDE  URINALYSIS, W/ REFLEX TO CULTURE (INFECTION SUSPECTED)  HEPATIC FUNCTION PANEL  TROPONIN I (HIGH SENSITIVITY)  TROPONIN I (HIGH SENSITIVITY)    EKG EKG Interpretation  Date/Time:  Tuesday March 20 2022 14:35:25 EDT Ventricular Rate:  126 PR Interval:  115 QRS Duration: 111 QT Interval:  314 QTC Calculation: 455 R Axis:   267 Text Interpretation: Sinus tachycardia Consider right atrial enlargement Anterolateral infarct, age indeterminate ST depression V1-V2 Confirmed by Georgina Snell [phone]) on 03/20/2022 4:27:01 PM  Radiology CT Head Wo Contrast  Result Date: 03/20/2022 CLINICAL DATA:  Neuro deficit, acute, stroke suspected. EXAM: CT HEAD WITHOUT CONTRAST TECHNIQUE: Contiguous axial images were obtained from the base of the skull through the vertex without intravenous contrast. RADIATION DOSE REDUCTION: This exam was performed according to the departmental dose-optimization program which includes automated exposure control, adjustment of the mA and/or kV according to patient size and/or use of iterative reconstruction technique. COMPARISON:  Head CT 07/10/2017.  MRI brain  07/31/2013. FINDINGS: Brain: Interval development of encephalomalacia in the medial right occipital lobe, consistent with prior infarct. 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 No acute intracranial hemorrhage. No hydrocephalus or extra-axial collection. No mass effect or midline shift. Vascular: No hyperdense vessel or unexpected calcification. Skull: No calvarial fracture or suspicious bone lesion. Skull base is unremarkable. Sinuses/Orbits: Chronic left sphenoid sinusitis. Mild mucosal disease in the left maxillary and right sphenoid sinuses. Orbits are unremarkable. Other: None. IMPRESSION: 1. No acute intracranial hemorrhage. 2. Interval development of encephalomalacia in the medial right occipital lobe, consistent with remote prior infarct. 3. 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 Electronically Signed   By: Emmit Alexanders M.D.   On: 03/20/2022 16:22    Procedures .Critical Care  Performed by: Regan Lemming, MD Authorized by: Regan Lemming, MD   Critical care provider statement:    Critical care time (minutes):  30   Critical care was necessary to treat or prevent imminent or life-threatening deterioration of the following conditions:  Sepsis   Critical care was time spent personally by me on the following activities:  Development of treatment plan with patient or surrogate, discussions with consultants, evaluation of patient's response to treatment, examination of patient, ordering and review of laboratory studies, ordering and review of radiographic studies, ordering and performing treatments and interventions, pulse oximetry, re-evaluation of patient's condition and review of old charts     Medications Ordered in ED Medications  ceFEPIme (MAXIPIME) 2 g in sodium chloride 0.9 % 100 mL IVPB (has no administration in time range)  metroNIDAZOLE (FLAGYL) IVPB 500  mg (has no administration in time range)  vancomycin (VANCOCIN) IVPB 1000 mg/200 mL premix (has no administration in time range)  ceFEPIme (MAXIPIME) 2 g in sodium chloride 0.9 % 100 mL IVPB (has no administration in time range)  vancomycin (VANCOCIN) IVPB 1000 mg/200 mL premix (has no administration in time range)  lactated ringers bolus 1,000 mL (0  mLs Intravenous Stopped 03/20/22 1626)  iohexol (OMNIPAQUE) 350 MG/ML injection 100 mL (100 mLs Intravenous Contrast Given 03/20/22 1558)    ED Course/ Medical Decision Making/ A&P Clinical Course as of 03/20/22 1632  Tue Mar 20, 2022  1510 Pulse Rate(!): 126 [JL]  1510 Temp: 98.4 F (36.9 C) [JL]  1510 SpO2: 99 % [JL]  1540 Lactic Acid, Venous: 1.7 [JL]  1540 WBC(!): 18.1 [JL]  1608 60 F with pmh PE/DVT, DM, CVA noncompliant with Eliquis here with CP/SOB, 100 lb weight loss over the past year. Episode of near syncope and slurred speech as well today no seizure activity. Getting sepsis antibiotics, follow up CTA PE, CTH, CTAP with contrast. No O2 requirement. Admit. [VB]    Clinical Course User Index [JL] Regan Lemming, MD [VB] Elgie Congo, MD                             Medical Decision Making Amount and/or Complexity of Data Reviewed Labs: ordered. Decision-making details documented in ED Course. Radiology: ordered.  Risk Prescription drug management.     57 year old female with medical history significant for recurrent PE had been prescribed Eliquis but has been noncompliant due to financial issues, prior CVA who presents to the emergency department with sudden onset chest pain, shortness of breath, productive cough since yesterday.  Patient recently got over COVID infection in the past month.  She has not been compliant with her Eliquis for the last several months.  He endorses pleuritic chest discomfort on the right side and substernally that radiates to her back.  She endorses mild shortness of breath.  She denies any  hemoptysis.  Earlier today, she had an episode of lightheadedness and slurred speech which lasted around 1 hour.  She was lightheaded and had an episode of near syncope earlier today, no seizure activity, was talking during the episode.  She has been endorsing a headache posteriorly.  No facial droop, no new weakness or numbness.  On arrival, the patient was afebrile, tachycardic heart rate 126, tachypneic in the 20s, saturating 100% on room air, hypertensive BP 144/89.  Physical exam significant for dry mucous membranes, sinus tachycardia, mild disorientation, no clear cranial nerve deficit, 5 out of 5 strength in all 4 extremities with intact sensation to light touch.  Due to concern for possible infectious etiology, code sepsis was initiated upon my evaluation.  The patient was administered an IV fluid bolus and covered with broad-spectrum IV antibiotics.  Blood cultures x 2 and urine cultures were ordered.  Laboratory evaluation significant for VBG with a mild respiratory alkalosis with a pH of 7.49, pCO2 of 32, bicarbonate of 24, BMP was notably significant for hyperglycemia to 313 without evidence of DKA with an anion gap of 11 and a bicarbonate of 22.  A CBC revealed a leukocytosis to 18.1, evidence of hemoconcentration with a hemoglobin of 15.2, neutrophilia with a left shift present.  COVID-19 and influenza PCR testing was ordered.  Initial lactic acid was normal.  Possible infectious etiology could include respiratory, intra-abdominal, genitourinary, additional concern for pulm embolism given the patient's noncompliance with her Eliquis and history of multiple PEs.  No clear evidence for acute stroke based on the patient's physical exam and neurologic exam.  Patient presented with an episode of near syncope.  No seizure-like activity.  Additionally considered viral infection such as COVID-19, influenza.  Considered stroke reactivation in the setting of infection.  Considered TIA.  CT  Head: IMPRESSION:  1. No acute intracranial hemorrhage.  2. Interval development of encephalomalacia in the medial right  occipital lobe, consistent with remote prior infarct.  3. Unchanged old infarcts in the left occipital lobe, left posterior  insula and left cerebellar hemisphere.   CTA PE study was ordered in addition to CT abdomen pelvis with contrast to further evaluate.  Results of remaining laboratory evaluation to include troponin and BNP were pending, CT imaging pending at time of signout.  Plan to follow-up the patient, likely plan for admission for further evaluation, full disposition and evaluation pending reassessment and remainder of labs and imaging.  Signout given to Dr. Nechama Guard at 1600.  Final Clinical Impression(s) / ED Diagnoses Final diagnoses:  None    Rx / DC Orders ED Discharge Orders     None         Regan Lemming, MD 03/20/22 [phone]

## 2022-03-20 NOTE — Sepsis Progress Note (Signed)
Notified bedside nurse of need to administer antibiotics. Pt in currently in CT.

## 2022-03-20 NOTE — ED Notes (Signed)
Pt in bed, pt reports some redness and itchiness after iv abx, pt has some redness to L arm, iv has positive blood return and no swelling noted, MD notified, benadryl given.

## 2022-03-20 NOTE — ED Triage Notes (Signed)
Pt c/o dizziness that started yesterday and has gotten worse today. Pt reports she has had slurred speech intermittently for about a week.

## 2022-03-21 ENCOUNTER — Inpatient Hospital Stay (HOSPITAL_COMMUNITY): Payer: Medicaid Other

## 2022-03-21 ENCOUNTER — Other Ambulatory Visit (HOSPITAL_COMMUNITY): Payer: Self-pay

## 2022-03-21 DIAGNOSIS — F419 Anxiety disorder, unspecified: Secondary | ICD-10-CM | POA: Diagnosis not present

## 2022-03-21 DIAGNOSIS — I63131 Cerebral infarction due to embolism of right carotid artery: Secondary | ICD-10-CM | POA: Diagnosis not present

## 2022-03-21 DIAGNOSIS — G9389 Other specified disorders of brain: Secondary | ICD-10-CM | POA: Diagnosis not present

## 2022-03-21 DIAGNOSIS — Z886 Allergy status to analgesic agent status: Secondary | ICD-10-CM | POA: Diagnosis not present

## 2022-03-21 DIAGNOSIS — K219 Gastro-esophageal reflux disease without esophagitis: Secondary | ICD-10-CM | POA: Diagnosis not present

## 2022-03-21 DIAGNOSIS — F17210 Nicotine dependence, cigarettes, uncomplicated: Secondary | ICD-10-CM | POA: Diagnosis not present

## 2022-03-21 DIAGNOSIS — I639 Cerebral infarction, unspecified: Secondary | ICD-10-CM | POA: Diagnosis not present

## 2022-03-21 DIAGNOSIS — I63411 Cerebral infarction due to embolism of right middle cerebral artery: Secondary | ICD-10-CM | POA: Diagnosis not present

## 2022-03-21 DIAGNOSIS — I63233 Cerebral infarction due to unspecified occlusion or stenosis of bilateral carotid arteries: Secondary | ICD-10-CM | POA: Diagnosis not present

## 2022-03-21 DIAGNOSIS — J168 Pneumonia due to other specified infectious organisms: Secondary | ICD-10-CM | POA: Diagnosis not present

## 2022-03-21 DIAGNOSIS — R4781 Slurred speech: Secondary | ICD-10-CM | POA: Diagnosis not present

## 2022-03-21 DIAGNOSIS — I6389 Other cerebral infarction: Secondary | ICD-10-CM | POA: Diagnosis not present

## 2022-03-21 DIAGNOSIS — N390 Urinary tract infection, site not specified: Secondary | ICD-10-CM

## 2022-03-21 DIAGNOSIS — Z66 Do not resuscitate: Secondary | ICD-10-CM | POA: Diagnosis not present

## 2022-03-21 DIAGNOSIS — A419 Sepsis, unspecified organism: Secondary | ICD-10-CM | POA: Diagnosis not present

## 2022-03-21 DIAGNOSIS — Z8616 Personal history of COVID-19: Secondary | ICD-10-CM | POA: Diagnosis not present

## 2022-03-21 DIAGNOSIS — I10 Essential (primary) hypertension: Secondary | ICD-10-CM | POA: Diagnosis not present

## 2022-03-21 DIAGNOSIS — E785 Hyperlipidemia, unspecified: Secondary | ICD-10-CM | POA: Diagnosis not present

## 2022-03-21 DIAGNOSIS — T45516A Underdosing of anticoagulants, initial encounter: Secondary | ICD-10-CM | POA: Diagnosis not present

## 2022-03-21 DIAGNOSIS — A4150 Gram-negative sepsis, unspecified: Secondary | ICD-10-CM

## 2022-03-21 DIAGNOSIS — E1142 Type 2 diabetes mellitus with diabetic polyneuropathy: Secondary | ICD-10-CM | POA: Diagnosis not present

## 2022-03-21 DIAGNOSIS — F32A Depression, unspecified: Secondary | ICD-10-CM | POA: Diagnosis not present

## 2022-03-21 DIAGNOSIS — J439 Emphysema, unspecified: Secondary | ICD-10-CM | POA: Diagnosis not present

## 2022-03-21 DIAGNOSIS — R29700 NIHSS score 0: Secondary | ICD-10-CM | POA: Diagnosis not present

## 2022-03-21 DIAGNOSIS — E1151 Type 2 diabetes mellitus with diabetic peripheral angiopathy without gangrene: Secondary | ICD-10-CM | POA: Diagnosis not present

## 2022-03-21 DIAGNOSIS — E1165 Type 2 diabetes mellitus with hyperglycemia: Secondary | ICD-10-CM | POA: Diagnosis not present

## 2022-03-21 DIAGNOSIS — Z91120 Patient's intentional underdosing of medication regimen due to financial hardship: Secondary | ICD-10-CM | POA: Diagnosis not present

## 2022-03-21 DIAGNOSIS — R000 Tachycardia, unspecified: Secondary | ICD-10-CM | POA: Diagnosis not present

## 2022-03-21 DIAGNOSIS — E279 Disorder of adrenal gland, unspecified: Secondary | ICD-10-CM | POA: Diagnosis not present

## 2022-03-21 DIAGNOSIS — R109 Unspecified abdominal pain: Secondary | ICD-10-CM | POA: Diagnosis not present

## 2022-03-21 DIAGNOSIS — R29818 Other symptoms and signs involving the nervous system: Secondary | ICD-10-CM | POA: Diagnosis not present

## 2022-03-21 DIAGNOSIS — I480 Paroxysmal atrial fibrillation: Secondary | ICD-10-CM | POA: Diagnosis not present

## 2022-03-21 DIAGNOSIS — Z1152 Encounter for screening for COVID-19: Secondary | ICD-10-CM | POA: Diagnosis not present

## 2022-03-21 DIAGNOSIS — Z794 Long term (current) use of insulin: Secondary | ICD-10-CM | POA: Diagnosis not present

## 2022-03-21 LAB — MAGNESIUM: Magnesium: 1.8 mg/dL (ref 1.7–2.4)

## 2022-03-21 LAB — CBC
HCT: 40.3 % (ref 36.0–46.0)
Hemoglobin: 13.7 g/dL (ref 12.0–15.0)
MCH: 32.1 pg (ref 26.0–34.0)
MCHC: 34 g/dL (ref 30.0–36.0)
MCV: 94.4 fL (ref 80.0–100.0)
Platelets: 247 10*3/uL (ref 150–400)
RBC: 4.27 MIL/uL (ref 3.87–5.11)
RDW: 13.7 % (ref 11.5–15.5)
WBC: 13.6 10*3/uL — ABNORMAL HIGH (ref 4.0–10.5)
nRBC: 0 % (ref 0.0–0.2)

## 2022-03-21 LAB — GLUCOSE, CAPILLARY
Glucose-Capillary: 213 mg/dL — ABNORMAL HIGH (ref 70–99)
Glucose-Capillary: 341 mg/dL — ABNORMAL HIGH (ref 70–99)
Glucose-Capillary: 239 mg/dL — ABNORMAL HIGH (ref 70–99)
Glucose-Capillary: 311 mg/dL — ABNORMAL HIGH (ref 70–99)

## 2022-03-21 LAB — ECHOCARDIOGRAM COMPLETE BUBBLE STUDY
AR max vel: 2.34 cm2
AV Area VTI: 2.24 cm2
AV Area mean vel: 2.31 cm2
AV Mean grad: 5.5 mmHg
AV Peak grad: 10.2 mmHg
Ao pk vel: 1.6 m/s
Area-P 1/2: 6.17 cm2
Calc EF: 58.9 %
MV M vel: 3.57 m/s
MV Peak grad: 51 mmHg
S' Lateral: 2.15 cm
Single Plane A2C EF: 65.3 %
Single Plane A4C EF: 53.7 %

## 2022-03-21 LAB — LIPID PANEL
Cholesterol: 170 mg/dL (ref 0–200)
HDL: 33 mg/dL — ABNORMAL LOW (ref >40)
LDL Cholesterol: 113 mg/dL — ABNORMAL HIGH (ref 0–99)
Total CHOL/HDL Ratio: 5.2 RATIO
Triglycerides: 118 mg/dL (ref <150)
VLDL: 24 mg/dL (ref 0–40)

## 2022-03-21 LAB — COMPREHENSIVE METABOLIC PANEL
ALT: 9 U/L (ref 0–44)
AST: 10 U/L — ABNORMAL LOW (ref 15–41)
Albumin: 3 g/dL — ABNORMAL LOW (ref 3.5–5.0)
Alkaline Phosphatase: 82 U/L (ref 38–126)
Anion gap: 13 (ref 5–15)
BUN: 8 mg/dL (ref 6–20)
CO2: 22 mmol/L (ref 22–32)
Calcium: 9 mg/dL (ref 8.9–10.3)
Chloride: 102 mmol/L (ref 98–111)
Creatinine, Ser: 0.65 mg/dL (ref 0.44–1.00)
GFR, Estimated: >60 mL/min (ref >60)
Glucose, Bld: 130 mg/dL — ABNORMAL HIGH (ref 70–99)
Potassium: 3.4 mmol/L — ABNORMAL LOW (ref 3.5–5.1)
Sodium: 137 mmol/L (ref 135–145)
Total Bilirubin: 0.5 mg/dL (ref 0.3–1.2)
Total Protein: 6.1 g/dL — ABNORMAL LOW (ref 6.5–8.1)

## 2022-03-21 LAB — PHOSPHORUS: Phosphorus: 3.7 mg/dL (ref 2.5–4.6)

## 2022-03-21 LAB — HEMOGLOBIN A1C
Hgb A1c MFr Bld: 11.4 % — ABNORMAL HIGH (ref 4.8–5.6)
Mean Plasma Glucose: 280 mg/dL

## 2022-03-21 MED ORDER — INSULIN GLARGINE-YFGN 100 UNIT/ML ~~LOC~~ SOLN
8.0000 [IU] | Freq: Every day | SUBCUTANEOUS | Status: DC
  Administered 2022-03-21 – 2022-03-22 (×2): 8 [IU] via SUBCUTANEOUS
  Filled 2022-03-21 (×2): qty 0.08

## 2022-03-21 MED ORDER — LEVALBUTEROL HCL 0.63 MG/3ML IN NEBU
0.6300 mg | INHALATION_SOLUTION | Freq: Two times a day (BID) | RESPIRATORY_TRACT | Status: DC
  Administered 2022-03-21 – 2022-03-22 (×3): 0.63 mg via RESPIRATORY_TRACT
  Filled 2022-03-21 (×3): qty 3

## 2022-03-21 MED ORDER — BUSPIRONE HCL 10 MG PO TABS
10.0000 mg | ORAL_TABLET | Freq: Two times a day (BID) | ORAL | Status: DC
  Administered 2022-03-21 – 2022-03-22 (×3): 15 mg via ORAL
  Filled 2022-03-21 (×3): qty 2

## 2022-03-21 MED ORDER — BUDESONIDE 0.5 MG/2ML IN SUSP: 0.5000 mg | Freq: Two times a day (BID) | RESPIRATORY_TRACT | Status: DC

## 2022-03-21 MED ORDER — IOHEXOL 350 MG/ML SOLN
75.0000 mL | Freq: Once | INTRAVENOUS | Status: AC | PRN
  Administered 2022-03-21: 75 mL via INTRAVENOUS

## 2022-03-21 MED ORDER — APIXABAN 5 MG PO TABS
5.0000 mg | ORAL_TABLET | Freq: Two times a day (BID) | ORAL | Status: DC
  Administered 2022-03-21 – 2022-03-22 (×3): 5 mg via ORAL
  Filled 2022-03-21 (×3): qty 1

## 2022-03-21 MED ORDER — LABETALOL HCL 5 MG/ML IV SOLN: 5.0000 mg | INTRAVENOUS | Status: DC | PRN

## 2022-03-21 MED ORDER — SODIUM CHLORIDE 0.9 % IV SOLN: INTRAVENOUS | Status: DC

## 2022-03-21 MED ORDER — STROKE: EARLY STAGES OF RECOVERY BOOK
Freq: Once | Status: AC
  Filled 2022-03-21: qty 1

## 2022-03-21 MED ORDER — CLOPIDOGREL BISULFATE 75 MG PO TABS
75.0000 mg | ORAL_TABLET | Freq: Every day | ORAL | Status: DC
  Administered 2022-03-21 – 2022-03-22 (×2): 75 mg via ORAL
  Filled 2022-03-21 (×2): qty 1

## 2022-03-21 MED ORDER — PROCHLORPERAZINE EDISYLATE 10 MG/2ML IJ SOLN: 5.0000 mg | Freq: Four times a day (QID) | INTRAMUSCULAR | Status: DC | PRN

## 2022-03-21 MED ORDER — FLUTICASONE FUROATE-VILANTEROL 100-25 MCG/ACT IN AEPB
1.0000 | INHALATION_SPRAY | Freq: Every day | RESPIRATORY_TRACT | Status: DC
  Administered 2022-03-21 – 2022-03-22 (×2): 1 via RESPIRATORY_TRACT
  Filled 2022-03-21: qty 28

## 2022-03-21 MED ORDER — INSULIN ASPART 100 UNIT/ML IJ SOLN
0.0000 [IU] | Freq: Every day | INTRAMUSCULAR | Status: DC
  Administered 2022-03-21: 4 [IU] via SUBCUTANEOUS

## 2022-03-21 MED ORDER — INSULIN ASPART 100 UNIT/ML IJ SOLN
3.0000 [IU] | Freq: Three times a day (TID) | INTRAMUSCULAR | Status: DC
  Administered 2022-03-21 – 2022-03-22 (×3): 3 [IU] via SUBCUTANEOUS

## 2022-03-21 MED ORDER — FENOFIBRATE 160 MG PO TABS
160.0000 mg | ORAL_TABLET | Freq: Every day | ORAL | Status: DC
  Administered 2022-03-21 – 2022-03-22 (×2): 160 mg via ORAL
  Filled 2022-03-21 (×2): qty 1

## 2022-03-21 MED ORDER — ALPRAZOLAM 0.25 MG PO TABS
0.2500 mg | ORAL_TABLET | Freq: Three times a day (TID) | ORAL | Status: DC | PRN
  Administered 2022-03-21: 0.25 mg via ORAL
  Filled 2022-03-21: qty 1

## 2022-03-21 MED ORDER — POTASSIUM CHLORIDE CRYS ER 20 MEQ PO TBCR
20.0000 meq | EXTENDED_RELEASE_TABLET | Freq: Two times a day (BID) | ORAL | Status: DC
  Administered 2022-03-21 – 2022-03-22 (×3): 20 meq via ORAL
  Filled 2022-03-21 (×3): qty 1

## 2022-03-21 MED ORDER — INSULIN ASPART 100 UNIT/ML IJ SOLN
0.0000 [IU] | Freq: Three times a day (TID) | INTRAMUSCULAR | Status: DC
  Administered 2022-03-21: 7 [IU] via SUBCUTANEOUS
  Administered 2022-03-21 (×2): 3 [IU] via SUBCUTANEOUS
  Administered 2022-03-22: 5 [IU] via SUBCUTANEOUS
  Administered 2022-03-22: 3 [IU] via SUBCUTANEOUS

## 2022-03-21 MED ORDER — PRAVASTATIN SODIUM 40 MG PO TABS
20.0000 mg | ORAL_TABLET | Freq: Every day | ORAL | Status: DC
  Administered 2022-03-21 – 2022-03-22 (×2): 20 mg via ORAL
  Filled 2022-03-21 (×2): qty 1

## 2022-03-21 MED ORDER — MELATONIN 5 MG PO TABS: 5.0000 mg | ORAL_TABLET | Freq: Every evening | ORAL | Status: DC | PRN

## 2022-03-21 MED ORDER — ACETAMINOPHEN 325 MG PO TABS
650.0000 mg | ORAL_TABLET | Freq: Four times a day (QID) | ORAL | Status: DC | PRN
  Administered 2022-03-21 – 2022-03-22 (×2): 650 mg via ORAL
  Filled 2022-03-21 (×3): qty 2

## 2022-03-21 NOTE — Evaluation (Signed)
Speech Language Pathology Evaluation Patient Details Name: [name] MRN: [id number] DOB: [birth year] Today's Date: 03/21/2022 Time: [id number] SLP Time Calculation (min) (ACUTE ONLY): 23 min  Problem List:  Patient Active Problem List   Diagnosis Date Noted   Sepsis ([place]) 03/20/2022   Pressure injury of skin 03/28/2021   SIRS (systemic inflammatory response syndrome) ([place]) 03/24/2021   Type 2 diabetes mellitus with hyperglycemia ([facility]) 03/24/2021   Nausea vomiting and diarrhea 03/24/2021   Recurrent pulmonary embolism ([place]) 03/24/2021   Chest pain 03/24/2021   CKD (chronic kidney disease), stage III ([place]) 05/17/2016   Type 2 diabetes mellitus with vascular disease ([place]) 05/17/2016   Essential hypertension 05/17/2016   DVT (deep venous thrombosis) ([place]) 05/16/2016   Chronic ischemic left PCA stroke 07/30/2013   TIA (transient ischemic attack) 07/30/2013   Posterior circulation stroke of uncertain pathology ([place]) 07/30/2013   Past Medical History:  Past Medical History:  Diagnosis Date   Acid reflux    Adrenal benign tumor    Apparently enlarging and patient "is supposed to have it taken out"   Anxiety    Depression    Diabetes mellitus without complication ([place])    DVT (deep vein thrombosis) in pregnancy    Hypertension    Nausea vomiting and diarrhea 03/24/2021   PE (pulmonary embolism)    Stroke [facility])    Past Surgical History:  Past Surgical History:  Procedure Laterality Date   DEBRIDEMENT AND CLOSURE WOUND Right 03/04/2019   Procedure: 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 ENDOSCOPY;  Service: Cardiovascular;  Laterality: N/A;   HPI:  Pt is a 57 y.o. female who presented with slurred speech and lightheadedness. MRI brain 3/13: Small subacute infarct in the posterior right frontal lobe. PMH: recurrent pulmonary embolism on Eliquis, prescribed Eliquis but had been noncompliant due to financial issues, prior CVA, recent COVID-19 viral infection last month.   Assessment / Plan / Recommendation Clinical Impression  Pt participated in speech-language-cognition evaluation with her husband present for the latter part of the evaluation. Pt reported that completed high school, lives with her spouse, and that her medications and finances are managed by their daughter who lives next door. Pt stated that her memory was poor at baseline and that she has always had difficulty with calculations. Pt's husband added that he attributes the pt's cognitive difficulty to her having vivid dreams which she cannot distinguish from reality. [facility] Mental Status Examination was completed to evaluate the pt's cognitive-linguistic skills. She achieved a score of 12/30 which is below the normal limits of 27 or more out of 30. She exhibited difficulty in the areas of attention, memory, and executive function as it relates to complex problem solving, awareness, and mental flexibility. Motor speech and language skills were [facility]. Pt's performance was discussed with the pt and her spouse who agreed that the pt's performance today is representative of what her baseline has been for the past few months and that there has been no acute change. Acute SLP  services are therefore not clinically indicated, but pt and her husband were advised to follow her with the pt's PCP regarding the pt's symptoms to help determine etiology.    SLP Assessment  SLP Recommendation/Assessment: Patient does not need any  further Speech [place] [facility] SLP Visit Diagnosis: Cognitive communication deficit (R41.841)    Recommendations for follow up therapy are one component of a multi-disciplinary discharge planning process, led by the attending physician.  Recommendations may be updated based on patient status, additional functional criteria and insurance authorization.    Follow Up Recommendations  No SLP follow up    Assistance Recommended at Discharge  Intermittent Supervision/Assistance  Functional Status Assessment Patient has not had a recent decline in their functional status  Frequency and Duration           SLP Evaluation Cognition  Overall Cognitive Status: History of cognitive impairments - at baseline Arousal/Alertness: Awake/alert Orientation Level: Oriented to person;Oriented to place;Oriented to time;Disoriented to time Year: 2024 Month: June Day of Week: Correct Attention: Focused;Sustained Focused Attention: Appears intact Sustained Attention: Impaired Sustained Attention Impairment: Verbal complex Memory: Impaired Memory Impairment: Storage deficit;Retrieval deficit;Decreased recall of new information (Immediate: 5/5; delayed: 1/5; with cues: 0/4) Awareness: Impaired Awareness Impairment: Emergent impairment Problem Solving: Impaired Problem Solving Impairment: Verbal complex (money: 0/3) Executive Function: Sequencing;Organizing Organizing: Impaired Organizing Impairment: Verbal complex (backward digit span: 1/2)       Comprehension  Auditory Comprehension Overall Auditory Comprehension: Appears within functional limits for tasks assessed Yes/No Questions: Within Functional Limits Commands: Within Functional Limits Conversation: Complex    Expression Expression Primary Mode of Expression: Verbal Verbal Expression Overall Verbal Expression: Appears within functional limits for tasks assessed Initiation: No impairment Level of Generative/Spontaneous  Verbalization: Conversation Repetition: No impairment Naming: No impairment Pragmatics: No impairment   Oral / Motor  Oral Motor/Sensory Function Overall Oral Motor/Sensory Function: Within functional limits Motor Speech Overall Motor Speech: Appears within functional limits for tasks assessed Respiration: Within functional limits Phonation: Normal Resonance: Within functional limits Articulation: Within functional limitis Intelligibility: Intelligible Motor Planning: Witnin functional limits Motor Speech Errors: Not applicable           Kelly Ranieri I. Hardin Negus, [place], [place] Office number [phone]  Horton Marshall 03/21/2022, 10:48 AM

## 2022-03-21 NOTE — Progress Notes (Signed)
Patient arrived in the unit at 0032 am from [facility], A&O x4, c/o mild generalized body aching, situated comfortably in a bed, initiated telemetry monitoring, running sinus tachycardia, will continue to monitor closely.

## 2022-03-21 NOTE — Evaluation (Signed)
Occupational Therapy Evaluation Patient Details Name: [name] MRN: [id number] DOB: [birth year] Today's Date: 03/21/2022   History of Present Illness Pt is a 57 y.o. female who presented 03/20/22 with chest pain and SOB along with an episode of lightheadedness and slurred speech lasting ~1 hour. MRI of brain revealed small subacute infarct in the posterior right frontal lobe and multiple chronic infarcts, most numerous in the posterior circulation. Pt also admitted for sepsis secondary to UTI. PMH: recurrent PE, CVA, DVT, HTN   Clinical Impression   PT admitted with CVA and UTI (+) sepsis. Pt currently with functional limitiations due to the deficits listed below (see OT problem list). Pt demonstrates cognitive deficits at baseline and spouse in room felt pt was communicating near baseline. Pt could benefit from setup and (A) at home to ensure proper hygiene/ meals. Pt reports she cooks meals for the family and this is a safety concern. Pt will benefit from skilled OT to increase their independence and safety with adls and balance to allow discharge HOme with [place].       Recommendations for follow up therapy are one component of a multi-disciplinary discharge planning process, led by the attending physician.  Recommendations may be updated based on patient status, additional functional criteria and insurance authorization.   Follow Up Recommendations  Home health OT     Assistance Recommended at Discharge PRN  Patient can return home with the following A little help with bathing/dressing/bathroom;Direct supervision/assist for medications management;Direct supervision/assist for financial management;Assist for transportation    Functional Status Assessment  Patient has had a recent decline in their functional status and demonstrates the ability to make significant improvements in function in a reasonable and predictable amount of time.  Equipment Recommendations  None recommended by  OT    Recommendations for Other Services       Precautions / Restrictions Precautions Precautions: Fall Precaution Comments: watch HR      Mobility Bed Mobility Overal bed mobility: Modified Independent                  Transfers Overall transfer level: Needs assistance   Transfers: Sit to/from Stand Sit to Stand: Supervision           General transfer comment: pt using bil ue to push off bed surface      Balance Overall balance assessment: Needs assistance Sitting-balance support: Bilateral upper extremity supported, Feet supported Sitting balance-Leahy Scale: Good     Standing balance support: During functional activity Standing balance-Leahy Scale: Fair               High level balance activites: Side stepping, Turns, Direction changes High Level Balance Comments: pt with poor awareness to room location with pathfinding and unable to recall the room number have repeating it 3 times in less than 75 ft to locate room. pt noted to have slight R bias with side by side walking iwth therapist. pt must stop and talk when given dual task           ADL either performed or assessed with clinical judgement   ADL Overall ADL's : Needs assistance/impaired     Grooming: Oral care;Standing;Supervision/safety Grooming Details (indicate cue type and reason): pt able to locate all adl items and complete task appropriately.                 Toilet Transfer: Minimal Investment banker, corporate Details (indicate cue type and reason): pt required min (A) due to the IV pole  and lack of awareness to cords / lines. pt wrapping herself into the lines and unable to reverse sequence to untangle Toileting- Clothing Manipulation and Hygiene: Min guard;Sitting/lateral lean       Functional mobility during ADLs: Minimal assistance General ADL Comments: Pt progressed from bed to bathroom to sink level to hallway transfers and then bed transfer. pt noted to  have a slight R bias in the hallway.     Vision Baseline Vision/History: 1 Wears glasses (reading) Additional Comments: able to read wall, handout without deficits     Perception     Praxis      Pertinent Vitals/Pain       Hand Dominance Right   Extremity/Trunk Assessment Upper Extremity Assessment Upper Extremity Assessment: Overall WFL for tasks assessed   Lower Extremity Assessment Lower Extremity Assessment: Defer to PT evaluation   Cervical / Trunk Assessment Cervical / Trunk Assessment: Normal   Communication Communication Communication: No difficulties   Cognition     Overall Cognitive Status: History of cognitive impairments - at baseline                                 General Comments: problem solving and recall of information pt required use of hall signs to walk down the hall and locate room. pt recalling room as 27 not 37     General Comments  HR Max 151    Exercises     Shoulder Instructions      Home Living Family/patient expects to be discharged to:: Private residence Living Arrangements: Spouse/significant other Available Help at Discharge: Family Type of Home: House Home Access: Level entry (a threshold elevated to cross)     Home Layout: One level     Bathroom Shower/Tub: Tub/shower unit (reports getting down in the tub)   Bathroom Toilet: Standard     Home Equipment: Rollator (4 wheels)   Additional Comments: cat named Stormy  Lives With: Spouse    Prior Functioning/Environment Prior Level of Function : Needs assist  Cognitive Assist : ADLs (cognitive)   ADLs (Cognitive): Set up cues         ADLs Comments: gets in the bottom of tub and spouse (A) in and out. spouse reports she does not brush her teeth alot. when Ot asked why to the patient she expressed they were all rotten and no good. pt cooks or [employer]oma [employer]ous[employer] ( daughter in Sports coach does it)        OT Problem List: Impaired balance (sitting and/or  standing);Decreased safety awareness;Decreased knowledge of use of DME or AE;Decreased knowledge of precautions      OT Treatment/Interventions: Self-care/ADL training;Therapeutic exercise;DME and/or AE instruction;Therapeutic activities;Patient/family education;Balance training    OT Goals(Current goals can be found in the care plan section) Acute Rehab OT Goals Patient Stated Goal: to return home to cat Stormy OT Goal Formulation: With patient Time For Goal Achievement: 04/04/22 Potential to Achieve Goals: Good  OT Frequency: Min 2X/week    Co-evaluation              AM-PAC OT "6 Clicks" Daily Activity     Outcome Measure Help from another person eating meals?: None Help from another person taking care of personal grooming?: A Little Help from another person toileting, which includes using toliet, bedpan, or urinal?: A Little Help from another person bathing (including washing, rinsing, drying)?: A Little Help from another person to put on and taking off  regular upper body clothing?: A Little Help from another person to put on and taking off regular lower body clothing?: A Little 6 Click Score: 19   End of Session Equipment Utilized During Treatment: Gait belt Nurse Communication: Mobility status;Precautions  Activity Tolerance: Patient tolerated treatment well Patient left: in bed;with call bell/phone within reach;with bed alarm set  OT Visit Diagnosis: Unsteadiness on feet (R26.81);Muscle weakness (generalized) (M62.81)                Time: 1120-1140 OT Time Calculation (min): 20 min Charges:  OT General Charges $OT Visit: 1 Visit OT Evaluation $OT Eval Moderate Complexity: 1 Mod   Brynn, OTR/L  Acute Rehabilitation Services Office: [phone] .   Jeri Modena 03/21/2022, 1:32 PM

## 2022-03-21 NOTE — Progress Notes (Signed)
PROGRESS NOTE    [name]  [id number] DOB: 1965-10-23 DOA: 03/20/2022 PCP: Gildardo Pounds, NP    Brief Narrative:  57 year old with history of recurrent pulmonary embolism and noncompliant to Eliquis, history of a stroke in 2015, recent COVID-19 infection who presented to the emergency room with slurred speech and lightheadedness noticed for the last several days.  She was found to have a high leukocyte counts, abnormal urine.  Started on antibiotics and admitted to the hospital.  CT head was negative on admission.  Subsequent MRI showed acute to stroke.   Assessment & Plan:   Sepsis secondary to UTI: Currently hemodynamically stabilizing.  Presented with leukocytosis 19.2, tachycardia.  Blood cultures and urine cultures pending.  Continue cefepime.  Discontinue vancomycin. CT scan abdomen pelvis without evidence of hydronephrosis.  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 Clinical findings, slurred speech for many days.  No focal neurological deficits. CT head findings, normal. MRI of the brain, small subacute infarct in the posterior right frontal lobe.  Multiple chronic infarcts most numerous in the posterior circulation. CT angiogram of the head and neck, pending and ordered. 2D echocardiogram, ordered.  Pending. Antiplatelet therapy, started back on Eliquis.  She is also on Plavix.  Depends upon workup, she can likely maintain on Eliquis alone to avoid risk of bleeding. LDL 113.  On cholesterol control medications. Hemoglobin A1c, 11.  See insulin recommendations below. Therapy recommendations, home health PT OT. Stroke workup, neurology workup.  Left adrenal mass: 4 to 5 cm left renal mass present since CT scan from 2015.  Multiple repeat CT scans for last 9 years size of the adrenal mass has remained stable. Recent CT scans of the chest, abdomen pelvis with no evidence of malignancy elsewhere.  This is likely benign process as it has not changed in  last 9 years.  Type 2 diabetes, uncontrolled with hyperglycemia: Hemoglobin A1c 11.  Patient with logistic problems to acquire insulin show not using as needed.  Will start patient on long-acting insulin and prandial insulin and uptitrate on discharge.  History of recurrent DVT and PE: Now she can afford to be on Eliquis.  Will prescribe on discharge.  Stroke is very small and likely will not have any bleeding can worsen.  Diabetic polyneuropathy: On gabapentin.  Smoker: Smoker with severe atherosclerotic disease, stroke.  Recommended smoking cessation.  Nicotine patch.    DVT prophylaxis:  apixaban (ELIQUIS) tablet 5 mg   Code Status: Full code Family Communication: None at the bedside in the morning rounds Disposition Plan: Status is: Inpatient Remains inpatient appropriate because: Sepsis, stroke workup     Consultants:  Neurology  Procedures:  None  Antimicrobials:  Vancomycin and cefepime, discontinue vancomycin   Subjective: Patient seen and examined.  Patient states that her speech has improved now.  Currently denies any complaints.  Still feels dizzy to get up but much better.  Objective: Vitals:   03/21/22 0331 03/21/22 0811 03/21/22 0839 03/21/22 1303  BP: (!) 118/59  121/73 110/78  Pulse: (!) 110 (!) 110 (!) 108 (!) 110  Resp: '14 16 16 16  '$ Temp: 98.5 F (36.9 C)  99.3 F (37.4 C) 98.9 F (37.2 C)  TempSrc: Oral  Oral Oral  SpO2: 96%  95% 95%  Weight:      Height:        Intake/Output Summary (Last 24 hours) at 03/21/2022 1415 Last data filed at 03/21/2022 [id number] Gross per 24 hour  Intake 1398.02 ml  Output 200  ml  Net 1198.02 ml   Filed Weights   03/20/22 1430  Weight: 54 kg    Examination:  General exam: Appears calm and comfortable  Respiratory system: Clear to auscultation. Respiratory effort normal. Cardiovascular system: S1 & S2 heard, RRR.  Gastrointestinal system: Soft.  Nontender.  Bowel sound present. Central nervous system: Alert  and oriented. No focal neurological deficits. Extremities: Symmetric 5 x 5 power.    Data Reviewed: I have personally reviewed following labs and imaging studies  CBC: Recent Labs  Lab 03/20/22 1507 03/20/22 1515 03/20/22 2058 03/21/22 0257  WBC 18.1*  --  19.2* 13.6*  NEUTROABS 14.3*  --   --   --   HGB 15.2* 16.0* 13.1 13.7  HCT 44.8 47.0* 39.6 40.3  MCV 94.3  --  94.7 94.4  PLT 298  --  260 [id number]   Basic Metabolic Panel: Recent Labs  Lab 03/20/22 1507 03/20/22 1515 03/21/22 0257  NA 130* 133* 137  K 3.7 4.9 3.4*  CL 97*  --  102  CO2 22  --  22  GLUCOSE 313*  --  130*  BUN 14  --  8  CREATININE 0.81  --  0.65  CALCIUM 9.0  --  9.0  MG  --   --  1.8  PHOS  --   --  3.7   GFR: Estimated Creatinine Clearance: 65.3 mL/min (by C-G formula based on SCr of 0.65 mg/dL). Liver Function Tests: Recent Labs  Lab 03/20/22 1507 03/21/22 0257  AST 12* 10*  ALT 11 9  ALKPHOS 94 82  BILITOT 0.6 0.5  PROT 7.1 6.1*  ALBUMIN 3.5 3.0*   No results for input(s): "LIPASE", "AMYLASE" in the last 168 hours. No results for input(s): "AMMONIA" in the last 168 hours. Coagulation Profile: No results for input(s): "INR", "PROTIME" in the last 168 hours. Cardiac Enzymes: No results for input(s): "CKTOTAL", "CKMB", "CKMBINDEX", "TROPONINI" in the last 168 hours. BNP (last 3 results) No results for input(s): "PROBNP" in the last 8760 hours. HbA1C: Recent Labs    03/20/22 1830  HGBA1C 11.4*   CBG: Recent Labs  Lab 03/20/22 1438 03/20/22 2235 03/21/22 0621 03/21/22 1221  GLUCAP 291* 391* 213* 341*   Lipid Profile: Recent Labs    03/21/22 0257  CHOL 170  HDL 33*  LDLCALC 113*  TRIG 118  CHOLHDL 5.2   Thyroid Function Tests: No results for input(s): "TSH", "T4TOTAL", "FREET4", "T3FREE", "THYROIDAB" in the last 72 hours. Anemia Panel: No results for input(s): "VITAMINB12", "FOLATE", "FERRITIN", "TIBC", "IRON", "RETICCTPCT" in the last 72 hours. Sepsis Labs: Recent  Labs  Lab 03/20/22 1504  LATICACIDVEN 1.7    Recent Results (from the past 240 hour(s))  Blood culture (routine x 2)     Status: None (Preliminary result)   Collection Time: 03/20/22  3:00 PM   Specimen: BLOOD  Result Value Ref Range Status   Specimen Description   Final    BLOOD LEFT ANTECUBITAL Performed at [facility], [address] [place], [place] [address]    Special Requests   Final    Blood Culture adequate volume BOTTLES DRAWN AEROBIC AND ANAEROBIC Performed at [facility], [address] [place], [place] [address]    Culture   Final    NO GROWTH < 24 HOURS Performed at [facility], [place] [address], [place],  [address]    Report Status PENDING  Incomplete  Blood culture (routine x 2)  Status: None (Preliminary result)   Collection Time: 03/20/22  3:07 PM   Specimen: BLOOD RIGHT FOREARM  Result Value Ref Range Status   Specimen Description   Final    BLOOD RIGHT FOREARM Performed at [facility], [address] [place], [place] [address]    Special Requests   Final    Blood Culture adequate volume BOTTLES DRAWN AEROBIC AND ANAEROBIC Performed at [facility], [address] [place], [place] [address]    Culture   Final    NO GROWTH < 24 HOURS Performed at [facility], [place] [address], [place], [place] [address]    Report Status PENDING  Incomplete  Resp panel by RT-PCR (RSV, Flu A&B, Covid) Anterior Nasal Swab     Status: None   Collection Time: 03/20/22  4:38 PM   Specimen: Anterior Nasal Swab  Result Value Ref Range Status   SARS Coronavirus 2 by RT PCR NEGATIVE NEGATIVE Final    Comment: (NOTE) SARS-CoV-2 target nucleic acids are NOT DETECTED.  The SARS-CoV-2 RNA is generally detectable in upper respiratory specimens during the acute phase of infection. The lowest concentration of SARS-CoV-2 viral copies this assay can detect is 138 copies/mL. A negative result does not preclude  SARS-Cov-2 infection and should not be used as the sole basis for treatment or other patient management decisions. A negative result may occur with  improper specimen collection/handling, submission of specimen other than nasopharyngeal swab, presence of viral mutation(s) within the areas targeted by this assay, and inadequate number of viral copies(<138 copies/mL). A negative result must be combined with clinical observations, patient history, and epidemiological information. The expected result is Negative.  Fact Sheet for Patients:  EntrepreneurPulse.com.au  Fact Sheet for Healthcare Providers:  IncredibleEmployment.be  This test is no t yet approved or cleared by the [place] FDA and  has been authorized for detection and/or diagnosis of SARS-CoV-2 by FDA under an Emergency Use Authorization (EUA). This EUA will remain  in effect (meaning this test can be used) for the duration of the COVID-19 declaration under Section 564(b)(1) of the Act, 21 U.S.C.section 360bbb-3(b)(1), unless the authorization is terminated  or revoked sooner.       Influenza A by PCR NEGATIVE NEGATIVE Final   Influenza B by PCR NEGATIVE NEGATIVE Final    Comment: (NOTE) The Xpert Xpress SARS-CoV-2/FLU/RSV plus assay is intended as an aid in the diagnosis of influenza from Nasopharyngeal swab specimens and should not be used as a sole basis for treatment. Nasal washings and aspirates are unacceptable for Xpert Xpress SARS-CoV-2/FLU/RSV testing.  Fact Sheet for Patients: EntrepreneurPulse.com.au  Fact Sheet for Healthcare Providers: IncredibleEmployment.be  This test is not yet approved or cleared by the [place] FDA and has been authorized for detection and/or diagnosis of SARS-CoV-2 by FDA under an Emergency Use Authorization (EUA). This EUA will remain in effect (meaning this test can be used) for the duration of  the COVID-19 declaration under Section 564(b)(1) of the Act, 21 U.S.C. section 360bbb-3(b)(1), unless the authorization is terminated or revoked.     Resp Syncytial Virus by PCR NEGATIVE NEGATIVE Final    Comment: (NOTE) Fact Sheet for Patients: EntrepreneurPulse.com.au  Fact Sheet for Healthcare Providers: IncredibleEmployment.be  This test is not yet approved or cleared by the [place] FDA and has been authorized for detection and/or diagnosis of SARS-CoV-2 by FDA under an Emergency Use Authorization (EUA). This EUA will remain in effect (meaning this test can  be used) for the duration of the COVID-19 declaration under Section 564(b)(1) of the Act, 21 U.S.C. section 360bbb-3(b)(1), unless the authorization is terminated or revoked.  Performed at [facility], [address], [place], [place] [address]          Radiology Studies: MR BRAIN WO CONTRAST  Result Date: 03/21/2022 CLINICAL DATA:  TIA. History of recurrent pulmonary embolism on Eliquis EXAM: MRI HEAD WITHOUT CONTRAST TECHNIQUE: Multiplanar, multiecho pulse sequences of the brain and surrounding structures were obtained without intravenous contrast. COMPARISON:  Head CT from yesterday FINDINGS: Brain: Small band of low-grade restricted diffusion in the right posterior frontal white matter. Chronic bilateral occipital and cerebellar infarcts. Chronic bilateral thalamic lacunar infarcts. Chronic posterior left insular cortex infarct. No acute hemorrhage, hydrocephalus, or collection. Brain volume is normal for age. 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 Skull and upper cervical spine: Normal marrow signal Sinuses/Orbits: Mucosal thickening in the left maxillary sinus. Opacified left posterior ethmoid air cell. Other: Intermittent motion artifact. IMPRESSION: 1.  Small subacute infarct in the posterior right frontal lobe. 2. Multiple chronic infarcts, most numerous in the posterior circulation. Electronically Signed   By: Jorje Guild M.D.   On: 03/21/2022 05:24   CT Angio Chest PE W and/or Wo Contrast  Result Date: 03/20/2022 CLINICAL DATA:  Pulmonary embolism (PE) suspected, high prob; Sepsis Abdominal pain, acute, nonlocalized EXAM: CT ANGIOGRAPHY CHEST CT ABDOMEN AND PELVIS WITH CONTRAST TECHNIQUE: Multidetector CT imaging of the chest was performed using the standard protocol during bolus administration of intravenous contrast. Multiplanar CT image reconstructions and MIPs were obtained to evaluate the vascular anatomy. Multidetector CT imaging of the abdomen and pelvis was performed using the standard protocol during bolus administration of intravenous contrast. RADIATION DOSE REDUCTION: This exam was performed according to the departmental dose-optimization program which includes automated exposure control, adjustment of the mA and/or kV according to patient size and/or use of iterative reconstruction technique. CONTRAST:  145m OMNIPAQUE IOHEXOL 350 MG/ML SOLN COMPARISON:  Chest XR, 03/24/2021.  CTA chest, 03/23/2021. FINDINGS: CTA CHEST FINDINGS Cardiovascular: Satisfactory opacification of the pulmonary arteries to the segmental level. No segmental or larger pulmonary embolus. Normal heart size. No pericardial effusion. Mild burden of calcified and noncalcified atherosclerosis of the aortic arch without hemodynamically-significant stenosis at the great vessel origins. Mediastinum/Nodes: No enlarged mediastinal, hilar, or axillary lymph nodes. Thyroid gland, trachea, and esophagus demonstrate no significant findings. Lungs/Pleura: Mild-to-moderate burden of centrilobular emphysematous lung change, greatest within the apices. Bibasilar tree-in-bud nodularities, increasing conspicuity since 03/2021 comparison. Punctate LEFT posterior basilar calcified  granuloma. No pleural effusion or pneumothorax. Musculoskeletal: No acute chest wall abnormality. Review of the MIP images confirms the above findings. CT ABDOMEN and PELVIS FINDINGS Hepatobiliary: Small perfusion defect at the anterior RIGHT hepatic lobe. No discrete focal liver abnormality. No gallstones, gallbladder wall thickening, or biliary dilatation. Pancreas: No pancreatic ductal dilatation or surrounding inflammatory changes. Spleen: Normal in size without focal abnormality. Adrenals/Urinary Tract: Heterogeneously dense LEFT adrenal mass with calcifications, measuring approximately 5.1 x 5.7 x 5.5 cm (AP by transaxial by CC) The RIGHT adrenal glands is unremarkable. Kidneys are normal, without renal calculi, focal lesion, or hydronephrosis. Bladder is unremarkable. Stomach/Bowel: Stomach is within normal limits. Appendix appears normal. No evidence of bowel wall thickening, distention, or inflammatory changes. Vascular/Lymphatic: Aortic atherosclerosis without aneurysmal dilatation. No enlarged abdominal or pelvic lymph nodes. Reproductive: Uterus and  adnexa are unremarkable. Asymmetric, mild fluid distention of the LEFT vaginal fornix. Other: No abdominal wall hernia. No abdominopelvic ascites. Musculoskeletal: Multifocal subcutaneous calcifications within the LEFT gluteus soft tissues, likely injection granulomas. No acute or significant osseous findings. Review of the MIP images confirms the above findings. IMPRESSION: 1. No segmental or larger pulmonary embolus. 2. Apical-predominant centrilobular emphysematous lung change with superimposed bibasilar tree-in-bud nodularities. Superimposed infection can not be excluded. 3. 5.7 cm heterogeneously dense LEFT adrenal mass. Consider non-emergent, outpatient multiphasic MRI for further evaluation. 4. No acute abdominopelvic process. 5. Aortic Atherosclerosis (ICD10-I70.0) and Emphysema (ICD10-J43.9). Additional incidental, chronic and senescent findings as  above. Electronically Signed   By: Michaelle Birks M.D.   On: 03/20/2022 16:42   CT ABDOMEN PELVIS W CONTRAST  Result Date: 03/20/2022 CLINICAL DATA:  Pulmonary embolism (PE) suspected, high prob; Sepsis Abdominal pain, acute, nonlocalized EXAM: CT ANGIOGRAPHY CHEST CT ABDOMEN AND PELVIS WITH CONTRAST TECHNIQUE: Multidetector CT imaging of the chest was performed using the standard protocol during bolus administration of intravenous contrast. Multiplanar CT image reconstructions and MIPs were obtained to evaluate the vascular anatomy. Multidetector CT imaging of the abdomen and pelvis was performed using the standard protocol during bolus administration of intravenous contrast. RADIATION DOSE REDUCTION: This exam was performed according to the departmental dose-optimization program which includes automated exposure control, adjustment of the mA and/or kV according to patient size and/or use of iterative reconstruction technique. CONTRAST:  132m OMNIPAQUE IOHEXOL 350 MG/ML SOLN COMPARISON:  Chest XR, 03/24/2021.  CTA chest, 03/23/2021. FINDINGS: CTA CHEST FINDINGS Cardiovascular: Satisfactory opacification of the pulmonary arteries to the segmental level. No segmental or larger pulmonary embolus. Normal heart size. No pericardial effusion. Mild burden of calcified and noncalcified atherosclerosis of the aortic arch without hemodynamically-significant stenosis at the great vessel origins. Mediastinum/Nodes: No enlarged mediastinal, hilar, or axillary lymph nodes. Thyroid gland, trachea, and esophagus demonstrate no significant findings. Lungs/Pleura: Mild-to-moderate burden of centrilobular emphysematous lung change, greatest within the apices. Bibasilar tree-in-bud nodularities, increasing conspicuity since 03/2021 comparison. Punctate LEFT posterior basilar calcified granuloma. No pleural effusion or pneumothorax. Musculoskeletal: No acute chest wall abnormality. Review of the MIP images confirms the above  findings. CT ABDOMEN and PELVIS FINDINGS Hepatobiliary: Small perfusion defect at the anterior RIGHT hepatic lobe. No discrete focal liver abnormality. No gallstones, gallbladder wall thickening, or biliary dilatation. Pancreas: No pancreatic ductal dilatation or surrounding inflammatory changes. Spleen: Normal in size without focal abnormality. Adrenals/Urinary Tract: Heterogeneously dense LEFT adrenal mass with calcifications, measuring approximately 5.1 x 5.7 x 5.5 cm (AP by transaxial by CC) The RIGHT adrenal glands is unremarkable. Kidneys are normal, without renal calculi, focal lesion, or hydronephrosis. Bladder is unremarkable. Stomach/Bowel: Stomach is within normal limits. Appendix appears normal. No evidence of bowel wall thickening, distention, or inflammatory changes. Vascular/Lymphatic: Aortic atherosclerosis without aneurysmal dilatation. No enlarged abdominal or pelvic lymph nodes. Reproductive: Uterus and adnexa are unremarkable. Asymmetric, mild fluid distention of the LEFT vaginal fornix. Other: No abdominal wall hernia. No abdominopelvic ascites. Musculoskeletal: Multifocal subcutaneous calcifications within the LEFT gluteus soft tissues, likely injection granulomas. No acute or significant osseous findings. Review of the MIP images confirms the above findings. IMPRESSION: 1. No segmental or larger pulmonary embolus. 2. Apical-predominant centrilobular emphysematous lung change with superimposed bibasilar tree-in-bud nodularities. Superimposed infection can not be excluded. 3. 5.7 cm heterogeneously dense LEFT adrenal mass. Consider non-emergent, outpatient multiphasic MRI for further evaluation. 4. No acute abdominopelvic process. 5. Aortic Atherosclerosis (ICD10-I70.0) and Emphysema (ICD10-J43.9). Additional incidental, chronic and senescent findings  as above. Electronically Signed   By: Michaelle Birks M.D.   On: 03/20/2022 16:42   CT Head Wo Contrast  Result Date: 03/20/2022 CLINICAL DATA:   Neuro deficit, acute, stroke suspected. EXAM: CT HEAD WITHOUT CONTRAST TECHNIQUE: Contiguous axial images were obtained from the base of the skull through the vertex without intravenous contrast. RADIATION DOSE REDUCTION: This exam was performed according to the departmental dose-optimization program which includes automated exposure control, adjustment of the mA and/or kV according to patient size and/or use of iterative reconstruction technique. COMPARISON:  Head CT 07/10/2017.  MRI brain 07/31/2013. FINDINGS: Brain: Interval development of encephalomalacia in the medial right occipital lobe, consistent with prior infarct. 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 No acute intracranial hemorrhage. No hydrocephalus or extra-axial collection. No mass effect or midline shift. Vascular: No hyperdense vessel or unexpected calcification. Skull: No calvarial fracture or suspicious bone lesion. Skull base is unremarkable. Sinuses/Orbits: Chronic left sphenoid sinusitis. Mild mucosal disease in the left maxillary and right sphenoid sinuses. Orbits are unremarkable. Other: None. IMPRESSION: 1. No acute intracranial hemorrhage. 2. Interval development of encephalomalacia in the medial right occipital lobe, consistent with remote prior infarct. 3. 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 Electronically Signed   By: Emmit Alexanders M.D.   On: 03/20/2022 16:22        Scheduled Meds:  [START ON 03/22/2022]  stroke: early stages of recovery book   Does not apply Once   apixaban  5 mg Oral BID   busPIRone  10-15 mg Oral BID   clopidogrel  75 mg Oral Daily   diphenhydrAMINE  12.5 mg Oral Once   fenofibrate  160 mg Oral Daily   fluticasone furoate-vilanterol  1 puff Inhalation Daily   insulin aspart  0-5 Units Subcutaneous QHS   insulin aspart  0-9 Units Subcutaneous TID WC   insulin aspart  3  Units Subcutaneous TID WC   insulin glargine-yfgn  8 Units Subcutaneous Daily   levalbuterol  0.63 mg Nebulization BID   pravastatin  20 mg Oral Daily   Continuous Infusions:  sodium chloride 50 mL/hr at 03/21/22 0333   ceFEPime (MAXIPIME) IV 2 g (03/21/22 0821)     LOS: 0 days    Time spent: 40 minutes.  Same-day admit.  No charge visit.    Barb Merino, MD [facility] Pager [phone]

## 2022-03-21 NOTE — Evaluation (Addendum)
Physical Therapy Evaluation Patient Details Name: [name] MRN: [id number] DOB: Feb 22, 1965 Today's Date: 03/21/2022  History of Present Illness  Pt is a 57 y.o. female who presented 03/20/22 with chest pain and SOB along with an episode of lightheadedness and slurred speech lasting ~1 hour. MRI of brain revealed small subacute infarct in the posterior right frontal lobe and multiple chronic infarcts, most numerous in the posterior circulation. Pt also admitted for sepsis secondary to UTI. PMH: recurrent PE, CVA, DVT, HTN   Clinical Impression  Pt presents with condition above and deficits mentioned below, see PT Problem List. PTA, she was independent without AD for functional mobility, except her spouse assists her with tub transfers. Pt lives with her spouse in a 1-level house with a threshold to negotiate for access. Currently, pt demonstrates deficits in L hip flexion strength, L leg coordination, balance, and cognition. She is at risk for falls, displaying LOB and needing minA to recover when ambulating without UE support and rotating her head to look L <> R. Pt displays improved stability when using a RW at this time. Recommending follow-up with HHPT at d/c. Will continue to follow acutely.       Recommendations for follow up therapy are one component of a multi-disciplinary discharge planning process, led by the attending physician.  Recommendations may be updated based on patient status, additional functional criteria and insurance authorization.  Follow Up Recommendations Home health PT      Assistance Recommended at Discharge Frequent or constant Supervision/Assistance  Patient can return home with the following  A little help with walking and/or transfers;A little help with bathing/dressing/bathroom;Assistance with cooking/housework;Direct supervision/assist for medications management;Direct supervision/assist for financial management;Assist for transportation;Help with stairs or  ramp for entrance    Equipment Recommendations None recommended by PT  Recommendations for Other Services       Functional Status Assessment Patient has had a recent decline in their functional status and demonstrates the ability to make significant improvements in function in a reasonable and predictable amount of time.     Precautions / Restrictions Precautions Precautions: Fall Precaution Comments: watch HR Restrictions Weight Bearing Restrictions: No      Mobility  Bed Mobility Overal bed mobility: Modified Independent             General bed mobility comments: No assistance needed, HOB elevated    Transfers Overall transfer level: Needs assistance Equipment used: Rolling walker (2 wheels), None Transfers: Sit to/from Stand Sit to Stand: Supervision           General transfer comment: Supervision for safety coming to stand from EOB to no AD 1x and from EOB to RW 1x, no LOB    Ambulation/Gait Ambulation/Gait assistance: Min guard, Min assist, Supervision Gait Distance (Feet): 300 Feet (x2 bouts of ~300 ft > ~35 ft) Assistive device: None, Rolling walker (2 wheels) Gait Pattern/deviations: Step-through pattern, Decreased stride length, Drifts right/left, Wide base of support Gait velocity: reduced Gait velocity interpretation: 1.31 - 2.62 ft/sec, indicative of limited community ambulator   General Gait Details: When ambulating without an AD, pt would display intermittent wide BOS and drift R/L when ambulating. Noted incoordinated steps. Pt needed up to minA to regain balance when pt had LOB laterally when cued to turn her head L <> R and look up <> down. Pt able to negotiate around obstacles and change speeds without LOB. Improved stability noted when using a RW  Stairs  Wheelchair Mobility    Modified Rankin (Stroke Patients Only) Modified Rankin (Stroke Patients Only) Pre-Morbid Rankin Score: Moderate disability Modified Rankin:  Moderately severe disability     Balance Overall balance assessment: Needs assistance Sitting-balance support: Feet supported, No upper extremity supported Sitting balance-Leahy Scale: Good     Standing balance support: During functional activity, No upper extremity supported, Bilateral upper extremity supported Standing balance-Leahy Scale: Fair Standing balance comment: Able to ambulate without UE support, but has LOB when dynamic gait balance is challenged with turning her head L <> R, needing minA to recover. Min guard when using RW to ambulate                             Pertinent Vitals/Pain Pain Assessment Pain Assessment: Faces Faces Pain Scale: No hurt Pain Intervention(s): Monitored during session    Home Living Family/patient expects to be discharged to:: Private residence Living Arrangements: Spouse/significant other Available Help at Discharge: Family Type of Home: House Home Access: Level entry (a threshold elevated to cross)       Home Layout: One level Home Equipment: Conservation officer, nature (2 wheels) Additional Comments: cat named Stormy    Prior Function Prior Level of Function : Needs assist  Cognitive Assist : ADLs (cognitive)   ADLs (Cognitive): Set up cues       Mobility Comments: No AD ADLs Comments: gets in the bottom of tub and spouse (A) in and out. spouse reports she does not brush her teeth alot. when Ot asked why to the patient she expressed they were all rotten and no good. pt cooks or Loma Sousa ( daughter in Sports coach does it)     Hand Dominance   Dominant Hand: Right    Extremity/Trunk Assessment   Upper Extremity Assessment Upper Extremity Assessment: Defer to OT evaluation    Lower Extremity Assessment Lower Extremity Assessment: LLE deficits/detail LLE Deficits / Details: incoordination noted, hip flexion weakness noted when compared to R, MMT scores of 4+L 5R hip flexion, 5Bil knee extension, 5Bil ankle dorsiflexion LLE  Coordination: decreased gross motor;decreased fine motor    Cervical / Trunk Assessment Cervical / Trunk Assessment: Normal  Communication   Communication: No difficulties  Cognition Arousal/Alertness: Awake/alert Behavior During Therapy: WFL for tasks assessed/performed Overall Cognitive Status: History of cognitive impairments - at baseline                                 General Comments: Pt with little awareness of IV line and not to get tangled up in it or leave IV behind        General Comments General comments (skin integrity, edema, etc.): educated pt and significant other of her risk for falls and for pt to use her rollator at d/c, and if she does not use it then recommend significant other guard her closely with standing mobility-they verbalized understanding    Exercises     Assessment/Plan    PT Assessment Patient needs continued PT services  PT Problem List Decreased strength;Decreased activity tolerance;Decreased balance;Decreased mobility;Decreased coordination;Decreased cognition;Decreased safety awareness       PT Treatment Interventions DME instruction;Gait training;Stair training;Functional mobility training;Therapeutic activities;Therapeutic exercise;Balance training;Neuromuscular re-education;Cognitive remediation;Patient/family education    PT Goals (Current goals can be found in the Care Plan section)  Acute Rehab PT Goals Patient Stated Goal: to go home PT Goal Formulation: With patient/family Time For Goal Achievement:  04/04/22 Potential to Achieve Goals: Good    Frequency Min 4X/week     Co-evaluation               AM-PAC PT "6 Clicks" Mobility  Outcome Measure Help needed turning from your back to your side while in a flat bed without using bedrails?: None Help needed moving from lying on your back to sitting on the side of a flat bed without using bedrails?: None Help needed moving to and from a bed to a chair (including  a wheelchair)?: A Little Help needed standing up from a chair using your arms (e.g., wheelchair or bedside chair)?: A Little Help needed to walk in hospital room?: A Little Help needed climbing 3-5 steps with a railing? : A Little 6 Click Score: 20    End of Session Equipment Utilized During Treatment: Gait belt Activity Tolerance: Patient tolerated treatment well Patient left: in bed;with call bell/phone within reach;with bed alarm set;with family/visitor present   PT Visit Diagnosis: Unsteadiness on feet (R26.81);Other abnormalities of gait and mobility (R26.89);Muscle weakness (generalized) (M62.81);Other symptoms and signs involving the nervous system [id number])    Time: [id number] PT Time Calculation (min) (ACUTE ONLY): 11 min   Charges:   PT Evaluation $PT Eval Moderate Complexity: 1 Mod          Moishe Spice, PT, DPT Acute Rehabilitation Services  Office: [phone]   Orvan Falconer 03/21/2022, 6:21 PM

## 2022-03-21 NOTE — Progress Notes (Signed)
Chaplain responded to a consult request for Advance Directive education.   Chaplain provided [name] the Advance Directive packet as well as education on Advance Directives-documents an individual completes to communicate their health care directions in advance of a time when they may need them. Chaplain informed pt the documents which may be completed here in the hospital are the Living Will and [name].   Chaplain informed her that the [name] is a legal document in which an individual names another person, their [place], to make health care decisions when the individual is not able to make them for themselves. The Health Care Agent's function can be temporary or permanent depending on the pt's ability to make and communicate those decisions independently. Chaplain informed pt in the absence of a Health Care Power of [name], the state of [name] directs health care providers to look to the following individuals in the order listed: legal guardian; an attorney?in?fact under a general power of attorney (POA) if that POA includes the right to make health care decisions; a husband or wife; a 66 of parents and adult children; a 42 of adult brothers and sisters; or an individual who has an established relationship with you, who is acting in good faith and who can convey your wishes.  If none of these person are available or willing to make medical decisions on a patient's behalf, the law allows the patient's doctor to make decisions for them as long as another doctor agrees with those decisions.  Chaplain also informed the patient that the Health Care agent has no decision-making authority over any affairs other than those related to his or her medical care.   The chaplain further educated the [name] that a Living Will is a legal document that allows an individual to state his or her desire not to receive life-prolonging measures in the  event that they have a condition that is incurable and will result in their death in a short period of time; they are unconscious, and doctors are confident that they will not regain consciousness; and/or they have advanced dementia or other substantial and irreversible loss of mental function. The chaplain informed pt that life-prolonging measures are medical treatments that would only serve to postpone death, including breathing machines, kidney dialysis, antibiotics, artificial nutrition and hydration (tube feeding), and similar forms of treatment and that if an individual is able to express their wishes, they may also make them known without the use of a Living Will, but in the event that an individual is not able to express their wishes themselves, a Living Will allows medical providers and the pt's family and friends ensure that they are not making decisions on the pt's behalf, but rather serving as the pt's voice to convey decisions the pt has already made.   The patient is aware that the decision to create an advance directive is theirs alone and they may chose not to complete the documents or may chose to complete one portion or both.  The patient was informed that they can revoke the documents at any time by striking through them and writing void or by completing new documents, but that it is also advisable that the individual verbally notify interested parties that their wishes have changed.  She is also aware that the document must be signed in the presence of a notary public and two witnesses and that this can be done while the patient is still admitted to  the hospital or after discharge in the community. If they decide to complete Advance Directives after being discharged from the hospital, they have been advised to notify all interested parties and to provide those documents to their physicians and loved ones in addition to bringing them to the hospital in the event of another hospitalization.    The chaplain informed the pt that if they desire to proceed with completing Advance Directive Documentation while they are still admitted, notary services are typically available at [facility] between the hours of 1:00 and 3:30 Monday-Thursday.    When the patient is ready to have these documents completed, the patient should request that their nurse place a spiritual care consult and indicate that the patient is ready to have their advance directives notarized so that arrangements for witnesses and notary public can be made.  Please page spiritual care if the patient desires further education or has questions.     Please page as further needs arise.  [name]. [name], M.Div. [facility] Chaplain Pager [phone] Office [phone]     03/21/22 1120  Spiritual Encounters  Type of Visit Initial  Care provided to: Pt and family  Reason for visit Advance directives  Advance Directives (For Healthcare)  Does Patient Have a Medical Advance Directive? No  Would patient like information on creating a medical advance directive? Yes (Inpatient - patient defers creating a medical advance directive at this time - Information given)

## 2022-03-21 NOTE — Progress Notes (Signed)
  Echocardiogram 2D Echocardiogram has been performed.  [name] 03/21/2022, 4:22 PM

## 2022-03-21 NOTE — Progress Notes (Signed)
Clinical/Bedside Swallow Evaluation Patient Details  Name: [name] MRN: [id number] Date of Birth: Dec 31, 1965  Today's Date: 03/21/2022 Time: SLP Start Time (ACUTE ONLY): 0955 SLP Stop Time (ACUTE ONLY): 1005 SLP Time Calculation (min) (ACUTE ONLY): 10 min  Past Medical History:  Past Medical History:  Diagnosis Date   Acid reflux    Adrenal benign tumor    Apparently enlarging and patient "is supposed to have it taken out"   Anxiety    Depression    Diabetes mellitus without complication ([place])    DVT (deep vein thrombosis) in pregnancy    Hypertension    Nausea vomiting and diarrhea 03/24/2021   PE (pulmonary embolism)    Stroke [facility])    Past Surgical History:  Past Surgical History:  Procedure Laterality Date   DEBRIDEMENT AND CLOSURE WOUND Right 03/04/2019   Procedure: 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 female who presented with slurred speech and lightheadedness. MRI brain 3/13: Small subacute infarct in the posterior right frontal lobe. PMH: recurrent pulmonary embolism on Eliquis, prescribed Eliquis but had been noncompliant due to financial issues, prior CVA, recent COVID-19 viral infection last month.    Assessment / Plan / Recommendation  Clinical Impression  Pt seen for clinical swallowing evaluation. Pt alert, pleasant, and cooperative. Denies dysphagia. Oral motor examination completed and remarkable  for white, patches on lingual body (?thrush; nurse made aware) and missing dentition/poor oral hygeine.   Pt demonstrated a functional oropharyngeal swallow per clinical assessment. Pt given trials of solid, puree, and thin liquids (via straw; single and sequential sips). Oral phase was [facility] with adequate oral preparatoin and clearance across trials. Pharyngeal swallow appeared to be [facility] as evidenced by seemingly timely swallow initiation to digital palpation, seemingly adequate laryngeal elevation to digital palpation, no overt s/sx across trials, and no changes to vocal quality/respiratory status across trials.   Recommend continuation of a regular consistency diet with thin liquids. No further SLP evaluation/tx for dysphagia indicated at this time.   SLP Visit Diagnosis: Dysphagia, unspecified (R13.10)    Aspiration Risk  Mild aspiration risk    Diet Recommendation Regular;Thin liquid   Medication Administration: Whole meds with liquid Supervision: Patient able to self feed Compensations: Minimize environmental distractions Postural Changes: Seated upright at 90 degrees;Remain upright for at least 30 minutes after po intake    Other  Recommendations Oral Care Recommendations: Oral care QID;Patient independent with oral care    Recommendations for follow up therapy are one component of a multi-disciplinary discharge planning process, led by the attending physician.  Recommendations may be updated based on patient status, additional functional criteria and insurance authorization.  Follow up Recommendations No SLP follow up      Assistance Recommended at Discharge Intermittent Supervision/Assistance  Functional Status Assessment Patient has not had a recent decline in their functional status         Prognosis Prognosis for improved oropharyngeal function: Good  Swallow Study   General Date of Onset: 03/20/22 HPI: Pt is a 57 y.o. female who presented with slurred speech and  lightheadedness. MRI brain 3/13: Small subacute infarct in the posterior right frontal lobe. PMH: recurrent pulmonary embolism on Eliquis, prescribed Eliquis but had been noncompliant due to financial issues, prior CVA, recent COVID-19 viral infection last month. Type of Study: Bedside Swallow Evaluation Previous Swallow Assessment: unknown Diet Prior to this Study: Regular;Thin liquids (Level 0) Respiratory Status: Room air History of Recent Intubation: No Behavior/Cognition: Alert;Cooperative;Pleasant mood Oral Cavity Assessment:  (white patches on lingual body) Oral Care Completed by SLP: No Oral Cavity - Dentition: Poor condition (some missing teeth; adequate) Vision: Functional for self-feeding Self-Feeding Abilities: Able to feed self Patient Positioning: Upright in bed Baseline Vocal Quality: Normal Volitional Cough: Strong Volitional Swallow: Able to elicit    Oral/Motor/Sensory Function Overall Oral Motor/Sensory Function: Within functional limits   Ice Chips Ice chips: Not tested   Thin Liquid Thin Liquid: Within functional limits Presentation: Straw;Self Fed Other Comments: ~4 oz; via single and sequential straw sips    Nectar Thick Nectar Thick Liquid: Not tested   Honey Thick Honey Thick Liquid: Not tested   Puree Puree: Within functional limits Presentation: Self Fed Other Comments: ~4 oz   Solid     Solid: Within functional limits Presentation: Self Fed Other Comments: x1/2 graham cracker     Cherrie Gauze, M.S., [place] Speech-Language Pathologist Secure Chat Preferred  O: [phone]  Quintella Baton 03/21/2022,11:13 AM

## 2022-03-21 NOTE — Consult Note (Addendum)
Neurology Consultation  Reason for Consult: stroke Referring Physician: Dr. Sloan Leiter  CC: slurred speech  History is obtained from:chart and patient  HPI: [name] is a 57 y.o. female PMH of PE, DVT who is supposed to be on Eliquis but is noncompliant, diabetes, hypertension, anxiety, depression, prior stroke in [id number] with embolic appearing infarcts worked up with loop recorder placement that has been negative for A-fib 10/27/2016-brought into the hospital for evaluation of an episode of slurred speech that lasted about an hour.  She reports that she was last known well at 12 PM tomorrow and had a episode of slurred speech that lasted for about an hour.  Also reported dizziness while trying to stand up.  Her son came and checked on her and felt that her speech was slurred and recommended that she come to the hospital.  Symptoms resolved in about an hour.  She came to an outlying ER.  Curbside neurology consultation obtained and recommended MRI be done.  She was also noted to be exhibiting leukocytosis and was admitted for further workup.  Leukocytosis likely secondary to sepsis from UTI. MRI of the brain was done that shows a right hemispheric embolic looking infarct. Neurology consultation was obtained for the stroke. She currently reports resolution of slurred speech symptoms but feels somewhat dizzy still.   LKW: 12 PM on 03/20/2022 IV thrombolysis given?: no, resolved symptoms-decision made at the outside ER Premorbid modified Rankin scale (mRS):0   ROS: Full ROS was performed and is negative except as noted in the HPI.  Significant positives - Cough, SOB, 100lb weight loss unintentional  Past Medical History:  Diagnosis Date   Acid reflux    Adrenal benign tumor    Apparently enlarging and patient "is supposed to have it taken out"   Anxiety    Depression    Diabetes mellitus without complication ([place])    DVT (deep vein thrombosis) in pregnancy    Hypertension    Nausea  vomiting and diarrhea 03/24/2021   PE (pulmonary embolism)    Stroke [facility])    Family History  Problem Relation Age of Onset   Heart attack Father 69    Social History:   reports that she has been smoking cigarettes. She has a 60.00 pack-year smoking history. She has never used smokeless tobacco. She reports that she does not drink alcohol and does not use drugs.  Medications  Current Facility-Administered Medications:    [START ON 03/22/2022]  stroke: early stages of recovery book, , Does not apply, Once, Barb Merino, MD   0.9 %  sodium chloride infusion, , Intravenous, Continuous, Kayleen Memos, DO, Last Rate: 50 mL/hr at 03/21/22 0333, New Bag at 03/21/22 0333   acetaminophen (TYLENOL) tablet 650 mg, 650 mg, Oral, Q6H PRN, Kayleen Memos, DO, 650 mg at 03/21/22 0548   apixaban (ELIQUIS) tablet 5 mg, 5 mg, Oral, BID, Hall, Carole N, DO, 5 mg at 03/21/22 0945   busPIRone (BUSPAR) tablet 10-15 mg, 10-15 mg, Oral, BID, Hall, Carole N, DO, 15 mg at 03/21/22 0945   ceFEPIme (MAXIPIME) 2 g in sodium chloride 0.9 % 100 mL IVPB, 2 g, Intravenous, Q8H, Rumbarger, Valeda Malm, RPH, Last Rate: 200 mL/hr at 03/21/22 0821, 2 g at 03/21/22 [id number]   clopidogrel (PLAVIX) tablet 75 mg, 75 mg, Oral, Daily, Irene Pap N, DO, 75 mg at 03/21/22 0945   diphenhydrAMINE (BENADRYL) 12.5 MG/5ML elixir 12.5 mg, 12.5 mg, Oral, Once, Sofia, Leslie K, PA-C   fenofibrate tablet  160 mg, 160 mg, Oral, Daily, Irene Pap N, DO, 160 mg at 03/21/22 0945   fluticasone furoate-vilanterol (BREO ELLIPTA) 100-25 MCG/ACT 1 puff, 1 puff, Inhalation, Daily, Irene Pap N, DO, 1 puff at 03/21/22 0806   insulin aspart (novoLOG) injection 0-5 Units, 0-5 Units, Subcutaneous, QHS, Hall, Carole N, DO   insulin aspart (novoLOG) injection 0-9 Units, 0-9 Units, Subcutaneous, TID WC, Kayleen Memos, DO, 3 Units at 03/21/22 0636   labetalol (NORMODYNE) injection 5 mg, 5 mg, Intravenous, Q2H PRN, Hall, Carole N, DO   levalbuterol (XOPENEX)  nebulizer solution 0.63 mg, 0.63 mg, Nebulization, BID, Irene Pap N, DO, 0.63 mg at 03/21/22 0806   melatonin tablet 5 mg, 5 mg, Oral, QHS PRN, [place] Crane, Carole N, DO   pravastatin (PRAVACHOL) tablet 20 mg, 20 mg, Oral, Daily, Irene Pap N, DO, 20 mg at 03/21/22 0944   prochlorperazine (COMPAZINE) injection 5 mg, 5 mg, Intravenous, Q6H PRN, Kayleen Memos, DO  Exam: Current vital signs: BP 121/73 (BP Location: Right Arm)   Pulse (!) 108   Temp 99.3 F (37.4 C) (Oral)   Resp 16   Ht '5\' 6"'$  (1.676 m)   Wt 54 kg   LMP  (LMP Unknown) Comment: "a few months"  SpO2 95%   BMI 19.21 kg/m  Vital signs in last 24 hours: Temp:  [98.4 F (36.9 C)-99.3 F (37.4 C)] 99.3 F (37.4 C) (03/13 0839) Pulse Rate:  [105-126] 108 (03/13 0839) Resp:  [10-25] 16 (03/13 0839) BP: (110-144)/(59-90) 121/73 (03/13 0839) SpO2:  [95 %-100 %] 95 % (03/13 0839) Weight:  [54 kg] 54 kg (03/12 1430) General: Awake and in no distress HEENT: Normocephalic atraumatic Lungs: Clear Cardiovascular: Regular rhythm Abdomen nondistended nontender Neurological exam Awake alert oriented x 3 Speech is nondysarthric No evidence of aphasia Cranial nerves II to XII intact Motor examination with no deficits Sensation intact to touch without extinction Coordination examination did not reveal any dysmetria NIH stroke scale-0   Labs I have reviewed labs in epic and the results pertinent to this consultation are:  CBC    Component Value Date/Time   WBC 13.6 (H) 03/21/2022 0257   RBC 4.27 03/21/2022 0257   HGB 13.7 03/21/2022 0257   HGB 15.1 05/01/2021 1532   HCT 40.3 03/21/2022 0257   HCT 46.3 05/01/2021 1532   PLT 247 03/21/2022 0257   PLT 304 05/01/2021 1532   MCV 94.4 03/21/2022 0257   MCV 97 05/01/2021 1532   MCH 32.1 03/21/2022 0257   MCHC 34.0 03/21/2022 0257   RDW 13.7 03/21/2022 0257   RDW 13.5 05/01/2021 1532   LYMPHSABS 2.7 03/20/2022 1507   LYMPHSABS 4.0 (H) 05/01/2021 1532   MONOABS 0.8  03/20/2022 1507   EOSABS 0.2 03/20/2022 1507   EOSABS 0.2 05/01/2021 1532   BASOSABS 0.1 03/20/2022 1507   BASOSABS 0.1 05/01/2021 1532    CMP     Component Value Date/Time   NA 137 03/21/2022 0257   NA 134 05/01/2021 1532   K 3.4 (L) 03/21/2022 0257   CL 102 03/21/2022 0257   CO2 22 03/21/2022 0257   GLUCOSE 130 (H) 03/21/2022 0257   BUN 8 03/21/2022 0257   BUN 8 05/01/2021 1532   CREATININE 0.65 03/21/2022 0257   CALCIUM 9.0 03/21/2022 0257   PROT 6.1 (L) 03/21/2022 0257   PROT 7.4 05/01/2021 1532   ALBUMIN 3.0 (L) 03/21/2022 0257   ALBUMIN 4.4 05/01/2021 1532   AST 10 (L) 03/21/2022 0257  ALT 9 03/21/2022 0257   ALKPHOS 82 03/21/2022 0257   BILITOT 0.5 03/21/2022 0257   BILITOT 0.3 05/01/2021 1532   GFRNONAA >60 03/21/2022 0257   GFRAA >60 02/28/2019 2041    Lipid Panel     Component Value Date/Time   CHOL 170 03/21/2022 0257   CHOL 248 (H) 05/01/2021 1532   TRIG 118 03/21/2022 0257   HDL 33 (L) 03/21/2022 0257   HDL 41 05/01/2021 1532   CHOLHDL 5.2 03/21/2022 0257   VLDL 24 03/21/2022 0257   LDLCALC 113 (H) 03/21/2022 0257   LDLCALC 162 (H) 05/01/2021 1532  A1c-11.4  Imaging I have reviewed the images obtained: MRI brain: Small subacute infarct in the posterior right frontal lobe.  Multiple chronic infarcts, most numerous in the posterior circulation. CT angio chest PE protocol -- no segmental or large PE.  Apical predominant centrilobular emphysematous changes within superimposed bibasilar tree-in-bud nodularities-superimposed infection cannot be excluded.  There is a 5.7 cm dense left adrenal mass-consider nonemergent outpatient multiphasic MRI for further evaluation.  No acute abdominopelvic process.  Aortic atherosclerosis and emphysema seen.  Assessment:  58 year old with history of PE, DVT not compliant with anticoagulation amongst other comorbidities presenting for an hours worth of dizziness and slurred speech with slurred speech resolved and some  dizziness still present, noted to have a subacute infarct in the posterior right frontal lobe along with multiple chronic infarcts in the posterior circulation. Etiology of the stroke still remains under investigation. Concern with her 100 pound weight loss over the last year, there might be an occult primary malignancy leading to hypercoagulability.  Other possibilities are cardioembolic etiology.  She has a loop recorder in place. CTA chest did not show any evidence of PE.  Impression Acute ischemic stroke-etiology under investigation Sepsis secondary to UTI-management per primary team.   Recommendations: Frequent neurochecks Telemetry She was started back on Eliquis-given the small stroke size, it is okay to continue her on Eliquis although usually I would like to wait about 3 days from the last known well to start it. CT angio head and neck 2D echo Lower extremity Dopplers A1c goal less than 7.  Management per medicine. Lipid panel shows LDL 113-high intensity statin for goal LDL less than 70. PT OT Speech therapy Allow for permissive hypertension for another day or so.  Given that she is on anticoagulation, I would avoid systolic blood pressure over 180.  Start normalizing BP from tomorrow onwards. Stroke team to follow Plan was discussed with Dr Sloan Leiter  -- Amie Portland, MD Neurologist [facility] Pager: [phone]

## 2022-03-21 NOTE — Inpatient Diabetes Management (Addendum)
Inpatient Diabetes Program Recommendations  AACE/ADA: New Consensus Statement on Inpatient Glycemic Control (2015)  Target Ranges:  Prepandial:   less than 140 mg/dL      Peak postprandial:   less than 180 mg/dL (1-2 hours)      Critically ill patients:  140 - 180 mg/dL   Lab Results  Component Value Date   GLUCAP 213 (H) 03/21/2022   HGBA1C 11.4 (H) 03/20/2022    Review of Glycemic Control  Latest Reference Range & Units 03/20/22 14:38 03/20/22 22:35 03/21/22 06:21 03/21/22 12:21  Glucose-Capillary 70 - 99 mg/dL 291 (H) 391 (H) 213 (H) 341 (H)  (H): Data is abnormally high  Diabetes history: DM2 Outpatient Diabetes medications: 70/30 12 units BID Current orders for Inpatient glycemic control: Novolog 0-9 units TID and 0-5 units QHS  Inpatient Diabetes Program Recommendations:    Please consider:  Semglee 8 units (50% of home dose and 0.15 units x 54 kg) Novolog 3 units TID with meals if she consumes at least 50%  Ordered Living Well with Diabetes Booklet.  Will see patient today regarding A1C of 11.4% (average BG 281 mg/dL).  Addendum'@12'$ :40:  Spoke with patient and spouse at bedside.  She has had Pre-diabetes for many years and was diagnosed with DM2 10 months ago and started on insulin.  They could only afford 2 insulin pens at that time and when she ran out she did not refill her insulin.  They recently qualified for Medicaid.  Asked pharmacy for a benefit check.  Lantus and Novolog are covered at $4 each.  They state they can afford this.  She has a functioning glucometer at home.    Reviewed patient's current A1c of 11.4% (average BG of 281 mg/dL). Explained what a A1c is and what it measures. Also reviewed goal A1c with patient, importance of good glucose control @ home, and blood sugar goals.  Discussed basic patho of DM2, diet, CHO's, importance of eliminating caloric beverages, follow up with PCP every 3 mos, vascular changes with uncontrolled glucose and long and short  term complications of uncontrolled DM.    They are familiar with the insulin pen.  They were able to tell me how to inject using the insulin pen.  Asked her to check CBG's ac/hs.    Reviewed hypoglycemia, signs, symptoms and treatments.   For DC please order:  Lantus Solostar insulin pen-order # [id number] Novolog Flex pen-order # [id number] Insulin pen needles-order # [id number]   Will continue to follow while inpatient.  Thank you, Reche Dixon, MSN, [place] Diabetes Coordinator Inpatient Diabetes Program [phone] (team pager from 8a-5p)

## 2022-03-21 NOTE — H&P (Addendum)
History and Physical  [name] [id number] DOB: 1965/03/04 DOA: 03/20/2022  Referring physician: Accepted by Dr. Roosevelt Locks [facility], hospitalist service. PCP: Gildardo Pounds, NP  Outpatient Specialists: Neurology. Patient coming from: Home through [facility] ED  Chief Complaint: Slurred speech  HPI: [name] is a 57 y.o. female with medical history significant for recurrent pulmonary embolism on Eliquis, prescribed Eliquis but had been noncompliant due to financial issues, prior CVA, recent COVID-19 viral infection last month, who initially presented to [facility] ED due to slurred speech and lightheadedness, told EDP that her symptoms started today however, told the writer that she had been symptomatic for the past 3 days.  In the ED, noncontrast CT head revealed no acute intracranial hemorrhage.  Interval development of encephalomalacia in the medial right occipital lobe, consistent with remote prior infarct.  Unchanged old infarct in the left occipital lobe.  Left posterior insula and left cerebral hemisphere.  Reportedly, her symptoms lasted about an hour.  Due to concern for possible TIA, EDP requested admission and transferred to [facility].  The patient was accepted by Dr. Roosevelt Locks to telemetry medical unit as inpatient status.  The patient was seen and examined at her bedside in the unit, a family member was present in the room.  Her slurred speech had resolved.  She has no new complaints.  ED Course: Tmax 98.9.  BP 126/80, pulse 105, respiratory 20, saturation 96% on room air.  Lab studies remarkable for sodium 133, glucose 313, WBC 19.2.  Review of Systems: Review of systems as noted in the HPI. All other systems reviewed and are negative.   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;  Surgeon: Iran Planas, MD;  Location: [place];  Service: Orthopedics;  Laterality: Right;  needs 60 min   LOOP RECORDER IMPLANT N/A 03/25/2014   Procedure: LOOP RECORDER IMPLANT;  Surgeon: [name], MD;  Location: [facility] CATH LAB;  Service: Cardiovascular;  Laterality: N/A;   NO PAST SURGERIES     TEE WITHOUT CARDIOVERSION N/A 03/25/2014   Procedure: TRANSESOPHAGEAL ECHOCARDIOGRAM (TEE);  Surgeon: [name], MD;  Location: [facility] ENDOSCOPY;  Service: Cardiovascular;  Laterality: N/A;    Social History:  reports that she has been smoking cigarettes. She has a 60.00 pack-year smoking history. She has never used smokeless tobacco. She reports that she does not drink alcohol and does not use drugs.   Allergies  Allergen Reactions   Amoxicillin Hives   Aspirin Hives   Codeine Hives   Penicillins Hives    Has patient had a PCN reaction causing immediate rash, facial/tongue/throat swelling, SOB or lightheadedness with hypotension: Yes Has patient had a PCN reaction causing severe rash involving mucus membranes or skin necrosis: No Has patient had a PCN reaction that required hospitalization No Has patient had a PCN reaction occurring within the last 10 years: Yes If all of the above answers  are "NO", then may proceed with Cephalosporin use.     Family History  Problem Relation Age of Onset   Heart attack Father 38      Prior to Admission medications   Medication Sig Start Date End Date Taking? Authorizing Provider  apixaban (ELIQUIS) 5 MG TABS tablet Take 1 tablet (5 mg total) by mouth 2 (two) times daily. NEED PASS 05/01/21   Gildardo Pounds, NP   blood glucose meter kit and supplies KIT Use up to 4 times daily as directed. 03/28/21   Nita Sells, MD  Blood Glucose Monitoring Suppl (TRUE METRIX METER) w/Device KIT Use as instructed. Check blood glucose level by fingerstick 3 times per day. 05/01/21   Gildardo Pounds, NP  budesonide (PULMICORT) 0.5 MG/2ML nebulizer solution Use 1 vial (2 mLs)  by nebulization 2  times daily. 03/28/21   Nita Sells, MD  busPIRone (BUSPAR) 10 MG tablet Take 1-1.5 tablets (10-15 mg total) by mouth 2 (two) times daily. 05/01/21   Gildardo Pounds, NP  clopidogrel (PLAVIX) 75 MG tablet Take 1 tablet (75 mg total) by mouth daily. NEEDS PASS 05/02/21   Gildardo Pounds, NP  diltiazem (CARDIZEM CD) 180 MG 24 hr capsule Take 1 capsule by mouth daily. 03/29/21   Nita Sells, MD  fenofibrate 160 MG tablet Take 1 tablet (160 mg total) by mouth daily. NEED PASS 05/01/21   Gildardo Pounds, NP  fluticasone furoate-vilanterol (BREO ELLIPTA) 100-25 MCG/ACT AEPB Inhale 1 puff into the lungs daily. NEED PASS 05/01/21   Gildardo Pounds, NP  glucose blood (TRUE METRIX BLOOD GLUCOSE TEST) test strip Use as instructed. Check blood glucose level by fingerstick 3 times per day. 05/01/21   Gildardo Pounds, NP  hydrOXYzine (ATARAX) 10 MG tablet Take 1 tablet by mouth 3 times daily. 05/01/21   Gildardo Pounds, NP  insulin aspart protamine - aspart (NOVOLOG 70/30 MIX) (70-30) 100 UNIT/ML FlexPen Inject 12 units into the skin 2 (two) times daily with a meal. 05/01/21   Gildardo Pounds, NP  Insulin Pen Needle (PEN NEEDLES 3/16") 31G X 5 MM MISC 1 each by Does not apply route 2 (two) times daily. 03/28/21   Nita Sells, MD  Insulin Pen Needle 31G X 5 MM MISC Use as directed for insulin injection twice daily 05/01/21   Gildardo Pounds, NP  levalbuterol (XOPENEX) 0.63 MG/3ML nebulizer solution Use 1 vial (3 mLs) by nebulization 2 times daily. 03/28/21   Nita Sells, MD  pravastatin (PRAVACHOL) 20 MG tablet  Take 1 tablet (20 mg total) by mouth daily. NEED PASS 05/01/21   Gildardo Pounds, NP  TRUEplus Lancets 28G MISC Use as instructed. Check blood glucose level by fingerstick 3 times per day. 05/01/21   Gildardo Pounds, NP  Vitamin D, Ergocalciferol, (DRISDOL) 1.25 MG (50000 UNIT) CAPS capsule Take 1 capsule (50,000 Units total) by mouth every 7 (seven) days. 05/02/21   Gildardo Pounds, NP    Physical Exam: BP 126/80   Pulse (!) 105   Temp 98.7 F (37.1 C) (Oral)   Resp 20   Ht '5\' 6"'$  (1.676 m)   Wt 54 kg   LMP  (LMP Unknown) Comment: "a few months"  SpO2 96%   BMI 19.21 kg/m   General: 57 y.o. year-old female well developed well nourished in no acute distress.  Alert and oriented x3. Cardiovascular: Regular rate and rhythm with no rubs or gallops.  No thyromegaly or JVD noted.  No lower extremity edema. 2/4 pulses in all 4 extremities. Respiratory: Clear to auscultation with no wheezes or rales. Good inspiratory effort. Abdomen: Soft nontender nondistended with normal bowel sounds x4 quadrants. Muskuloskeletal: No cyanosis, clubbing or edema noted bilaterally Neuro: CN II-XII intact, strength, sensation, reflexes Skin: No ulcerative lesions noted or rashes Psychiatry: Judgement and insight appear normal. Mood is appropriate for condition and setting          Labs on Admission:  Basic Metabolic Panel: Recent Labs  Lab 03/20/22 1507 03/20/22 1515  NA 130* 133*  K 3.7 4.9  CL 97*  --   CO2 22  --   GLUCOSE 313*  --   BUN 14  --   CREATININE 0.81  --   CALCIUM 9.0  --    Liver Function Tests: Recent Labs  Lab 03/20/22 1507  AST 12*  ALT 11  ALKPHOS 94  BILITOT 0.6  PROT 7.1  ALBUMIN 3.5   No results for input(s): "LIPASE", "AMYLASE" in the last 168 hours. No results for input(s): "AMMONIA" in the last 168 hours. CBC: Recent Labs  Lab 03/20/22 1507 03/20/22 1515 03/20/22 2058  WBC 18.1*  --  19.2*  NEUTROABS 14.3*  --   --   HGB 15.2* 16.0* 13.1  HCT 44.8  47.0* 39.6  MCV 94.3  --  94.7  PLT 298  --  260   Cardiac Enzymes: No results for input(s): "CKTOTAL", "CKMB", "CKMBINDEX", "TROPONINI" in the last 168 hours.  BNP (last 3 results) Recent Labs    03/23/21 1631 03/20/22 1507  BNP 139.2* 46.9    ProBNP (last 3 results) No results for input(s): "PROBNP" in the last 8760 hours.  CBG: Recent Labs  Lab 03/20/22 1438 03/20/22 2235  GLUCAP 291* 391*    Radiological Exams on Admission: CT Angio Chest PE W and/or Wo Contrast  Result Date: 03/20/2022 CLINICAL DATA:  Pulmonary embolism (PE) suspected, high prob; Sepsis Abdominal pain, acute, nonlocalized EXAM: CT ANGIOGRAPHY CHEST CT ABDOMEN AND PELVIS WITH CONTRAST TECHNIQUE: Multidetector CT imaging of the chest was performed using the standard protocol during bolus administration of intravenous contrast. Multiplanar CT image reconstructions and MIPs were obtained to evaluate the vascular anatomy. Multidetector CT imaging of the abdomen and pelvis was performed using the standard protocol during bolus administration of intravenous contrast. RADIATION DOSE REDUCTION: This exam was performed according to the departmental dose-optimization program which includes automated exposure control, adjustment of the mA and/or kV according to patient size and/or use of iterative reconstruction technique. CONTRAST:  137m OMNIPAQUE IOHEXOL 350 MG/ML SOLN COMPARISON:  Chest XR, 03/24/2021.  CTA chest, 03/23/2021. FINDINGS: CTA CHEST FINDINGS Cardiovascular: Satisfactory opacification of the pulmonary arteries to the segmental level. No segmental or larger pulmonary embolus. Normal heart size. No pericardial effusion. Mild burden of calcified and noncalcified atherosclerosis of the aortic arch without hemodynamically-significant stenosis at the great vessel origins. Mediastinum/Nodes: No enlarged mediastinal, hilar, or axillary lymph nodes. Thyroid gland, trachea, and esophagus demonstrate no significant  findings. Lungs/Pleura: Mild-to-moderate burden of centrilobular emphysematous lung change, greatest within the apices. Bibasilar tree-in-bud nodularities, increasing conspicuity since 03/2021 comparison. Punctate LEFT posterior basilar calcified granuloma. No pleural effusion or pneumothorax. Musculoskeletal: No acute chest wall abnormality. Review of the MIP images confirms the above findings. CT ABDOMEN and PELVIS FINDINGS Hepatobiliary: Small perfusion defect at the anterior RIGHT hepatic lobe. No discrete focal liver abnormality. No gallstones, gallbladder wall thickening, or biliary dilatation. Pancreas: No pancreatic ductal dilatation or surrounding inflammatory changes. Spleen:  Normal in size without focal abnormality. Adrenals/Urinary Tract: Heterogeneously dense LEFT adrenal mass with calcifications, measuring approximately 5.1 x 5.7 x 5.5 cm (AP by transaxial by CC) The RIGHT adrenal glands is unremarkable. Kidneys are normal, without renal calculi, focal lesion, or hydronephrosis. Bladder is unremarkable. Stomach/Bowel: Stomach is within normal limits. Appendix appears normal. No evidence of bowel wall thickening, distention, or inflammatory changes. Vascular/Lymphatic: Aortic atherosclerosis without aneurysmal dilatation. No enlarged abdominal or pelvic lymph nodes. Reproductive: Uterus and adnexa are unremarkable. Asymmetric, mild fluid distention of the LEFT vaginal fornix. Other: No abdominal wall hernia. No abdominopelvic ascites. Musculoskeletal: Multifocal subcutaneous calcifications within the LEFT gluteus soft tissues, likely injection granulomas. No acute or significant osseous findings. Review of the MIP images confirms the above findings. IMPRESSION: 1. No segmental or larger pulmonary embolus. 2. Apical-predominant centrilobular emphysematous lung change with superimposed bibasilar tree-in-bud nodularities. Superimposed infection can not be excluded. 3. 5.7 cm heterogeneously dense LEFT  adrenal mass. Consider non-emergent, outpatient multiphasic MRI for further evaluation. 4. No acute abdominopelvic process. 5. Aortic Atherosclerosis (ICD10-I70.0) and Emphysema (ICD10-J43.9). Additional incidental, chronic and senescent findings as above. Electronically Signed   By: Michaelle Birks M.D.   On: 03/20/2022 16:42   CT ABDOMEN PELVIS W CONTRAST  Result Date: 03/20/2022 CLINICAL DATA:  Pulmonary embolism (PE) suspected, high prob; Sepsis Abdominal pain, acute, nonlocalized EXAM: CT ANGIOGRAPHY CHEST CT ABDOMEN AND PELVIS WITH CONTRAST TECHNIQUE: Multidetector CT imaging of the chest was performed using the standard protocol during bolus administration of intravenous contrast. Multiplanar CT image reconstructions and MIPs were obtained to evaluate the vascular anatomy. Multidetector CT imaging of the abdomen and pelvis was performed using the standard protocol during bolus administration of intravenous contrast. RADIATION DOSE REDUCTION: This exam was performed according to the departmental dose-optimization program which includes automated exposure control, adjustment of the mA and/or kV according to patient size and/or use of iterative reconstruction technique. CONTRAST:  13m OMNIPAQUE IOHEXOL 350 MG/ML SOLN COMPARISON:  Chest XR, 03/24/2021.  CTA chest, 03/23/2021. FINDINGS: CTA CHEST FINDINGS Cardiovascular: Satisfactory opacification of the pulmonary arteries to the segmental level. No segmental or larger pulmonary embolus. Normal heart size. No pericardial effusion. Mild burden of calcified and noncalcified atherosclerosis of the aortic arch without hemodynamically-significant stenosis at the great vessel origins. Mediastinum/Nodes: No enlarged mediastinal, hilar, or axillary lymph nodes. Thyroid gland, trachea, and esophagus demonstrate no significant findings. Lungs/Pleura: Mild-to-moderate burden of centrilobular emphysematous lung change, greatest within the apices. Bibasilar tree-in-bud  nodularities, increasing conspicuity since 03/2021 comparison. Punctate LEFT posterior basilar calcified granuloma. No pleural effusion or pneumothorax. Musculoskeletal: No acute chest wall abnormality. Review of the MIP images confirms the above findings. CT ABDOMEN and PELVIS FINDINGS Hepatobiliary: Small perfusion defect at the anterior RIGHT hepatic lobe. No discrete focal liver abnormality. No gallstones, gallbladder wall thickening, or biliary dilatation. Pancreas: No pancreatic ductal dilatation or surrounding inflammatory changes. Spleen: Normal in size without focal abnormality. Adrenals/Urinary Tract: Heterogeneously dense LEFT adrenal mass with calcifications, measuring approximately 5.1 x 5.7 x 5.5 cm (AP by transaxial by CC) The RIGHT adrenal glands is unremarkable. Kidneys are normal, without renal calculi, focal lesion, or hydronephrosis. Bladder is unremarkable. Stomach/Bowel: Stomach is within normal limits. Appendix appears normal. No evidence of bowel wall thickening, distention, or inflammatory changes. Vascular/Lymphatic: Aortic atherosclerosis without aneurysmal dilatation. No enlarged abdominal or pelvic lymph nodes. Reproductive: Uterus and adnexa are unremarkable. Asymmetric, mild fluid distention of the LEFT vaginal fornix. Other: No abdominal wall hernia. No abdominopelvic ascites. Musculoskeletal: Multifocal subcutaneous calcifications within  the LEFT gluteus soft tissues, likely injection granulomas. No acute or significant osseous findings. Review of the MIP images confirms the above findings. IMPRESSION: 1. No segmental or larger pulmonary embolus. 2. Apical-predominant centrilobular emphysematous lung change with superimposed bibasilar tree-in-bud nodularities. Superimposed infection can not be excluded. 3. 5.7 cm heterogeneously dense LEFT adrenal mass. Consider non-emergent, outpatient multiphasic MRI for further evaluation. 4. No acute abdominopelvic process. 5. Aortic  Atherosclerosis (ICD10-I70.0) and Emphysema (ICD10-J43.9). Additional incidental, chronic and senescent findings as above. Electronically Signed   By: Michaelle Birks M.D.   On: 03/20/2022 16:42   CT Head Wo Contrast  Result Date: 03/20/2022 CLINICAL DATA:  Neuro deficit, acute, stroke suspected. EXAM: CT HEAD WITHOUT CONTRAST TECHNIQUE: Contiguous axial images were obtained from the base of the skull through the vertex without intravenous contrast. RADIATION DOSE REDUCTION: This exam was performed according to the departmental dose-optimization program which includes automated exposure control, adjustment of the mA and/or kV according to patient size and/or use of iterative reconstruction technique. COMPARISON:  Head CT 07/10/2017.  MRI brain 07/31/2013. FINDINGS: Brain: Interval development of encephalomalacia in the medial right occipital lobe, consistent with prior infarct. 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 No acute intracranial hemorrhage. No hydrocephalus or extra-axial collection. No mass effect or midline shift. Vascular: No hyperdense vessel or unexpected calcification. Skull: No calvarial fracture or suspicious bone lesion. Skull base is unremarkable. Sinuses/Orbits: Chronic left sphenoid sinusitis. Mild mucosal disease in the left maxillary and right sphenoid sinuses. Orbits are unremarkable. Other: None. IMPRESSION: 1. No acute intracranial hemorrhage. 2. Interval development of encephalomalacia in the medial right occipital lobe, consistent with remote prior infarct. 3. 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 Electronically Signed   By: Emmit Alexanders M.D.   On: 03/20/2022 16:22    EKG: I independently viewed the EKG done and my findings are as followed: Sinus tachycardia rate of 126, nonspecific ST-T changes.  QTc 455.  Assessment/Plan Present on Admission:   Sepsis ([place])  Principal Problem:   Sepsis ([place])  Sepsis secondary to UTI, POA Presented with leukocytosis, 19.2, tachycardia 107, UA positive for pyuria. Follow urine culture and peripheral blood cultures for ID and sensitivities Monitor fever curve and WBC Started on Rocephin, continue.  Slurred speech, concerning for TIA Admitted for TIA workup MRI brain 2D echo with bubble study Fasting lipid panel, hemoglobin A1c Consult neurology, if MRI is positive for acute or subacute stroke PT/OT/speech therapist evaluation Permissive hypertension, treat SBP greater than 220 or DBP greater than 120..  Type 2 diabetes with hyperglycemia Presented with serum glucose 313 Obtain hemoglobin A1c Start insulin sliding scale. Gentle IV fluid hydration NS 50 cc/h x 2 days.  History of CVA Resume home regimen, clopidogrel, fenofibrate, pravastatin Follow MRI brain  Chronic anxiety/depression Resume home regimen.  Diabetic polyneuropathy Resume gabapentin  History of DVT Resume home Eliquis   Critical care time: 65 minutes   DVT prophylaxis: Home Eliquis  Code Status: DNR.  Family Communication: Family member at bedside  Disposition Plan: Admitted to telemetry medical unit  Consults called: None.  Admission status: Inpatient status.   Status is: Inpatient The patient requires at least 2 midnights for further evaluation and treatment of present condition.   Kayleen Memos MD [facility] Pager [phone]  If 7PM-7AM, please contact night-coverage [email or web address] Password [facility]  03/21/2022, 2:56 AM

## 2022-03-21 NOTE — Plan of Care (Signed)
  Problem: Education: Goal: Ability to describe self-care measures that may prevent or decrease complications (Diabetes Survival Skills Education) will improve Outcome: Progressing Goal: Individualized Educational Video(s) Outcome: Progressing   Problem: Coping: Goal: Ability to adjust to condition or change in health will improve Outcome: Progressing   Problem: Fluid Volume: Goal: Ability to maintain a balanced intake and output will improve Outcome: Progressing   Problem: Health Behavior/Discharge Planning: Goal: Ability to identify and utilize available resources and services will improve Outcome: Progressing Goal: Ability to manage health-related needs will improve Outcome: Progressing   Problem: Metabolic: Goal: Ability to maintain appropriate glucose levels will improve Outcome: Progressing   Problem: Nutritional: Goal: Maintenance of adequate nutrition will improve Outcome: Progressing Goal: Progress toward achieving an optimal weight will improve Outcome: Progressing   Problem: Skin Integrity: Goal: Risk for impaired skin integrity will decrease Outcome: Progressing   Problem: Tissue Perfusion: Goal: Adequacy of tissue perfusion will improve Outcome: Progressing   Problem: Education: Goal: Knowledge of General Education information will improve Description: Including pain rating scale, medication(s)/side effects and non-pharmacologic comfort measures Outcome: Progressing   Problem: Health Behavior/Discharge Planning: Goal: Ability to manage health-related needs will improve Outcome: Progressing   Problem: Clinical Measurements: Goal: Ability to maintain clinical measurements within normal limits will improve Outcome: Progressing Goal: Will remain free from infection Outcome: Progressing Goal: Diagnostic test results will improve Outcome: Progressing Goal: Respiratory complications will improve Outcome: Progressing Goal: Cardiovascular complication will  be avoided Outcome: Progressing   Problem: Activity: Goal: Risk for activity intolerance will decrease Outcome: Progressing   Problem: Nutrition: Goal: Adequate nutrition will be maintained Outcome: Progressing   Problem: Coping: Goal: Level of anxiety will decrease Outcome: Progressing   Problem: Elimination: Goal: Will not experience complications related to bowel motility Outcome: Progressing Goal: Will not experience complications related to urinary retention Outcome: Progressing   Problem: Pain Managment: Goal: General experience of comfort will improve Outcome: Progressing   Problem: Safety: Goal: Ability to remain free from injury will improve Outcome: Progressing   Problem: Skin Integrity: Goal: Risk for impaired skin integrity will decrease Outcome: Progressing   Problem: Education: Goal: Knowledge of disease or condition will improve Outcome: Progressing Goal: Knowledge of secondary prevention will improve (MUST DOCUMENT ALL) Outcome: Progressing Goal: Knowledge of patient specific risk factors will improve (Mark N/A or DELETE if not current risk factor) Outcome: Progressing   Problem: Ischemic Stroke/TIA Tissue Perfusion: Goal: Complications of ischemic stroke/TIA will be minimized Outcome: Progressing   Problem: Coping: Goal: Will verbalize positive feelings about self Outcome: Progressing Goal: Will identify appropriate support needs Outcome: Progressing   Problem: Health Behavior/Discharge Planning: Goal: Ability to manage health-related needs will improve Outcome: Progressing Goal: Goals will be collaboratively established with patient/family Outcome: Progressing   Problem: Self-Care: Goal: Ability to participate in self-care as condition permits will improve Outcome: Progressing Goal: Verbalization of feelings and concerns over difficulty with self-care will improve Outcome: Progressing Goal: Ability to communicate needs accurately will  improve Outcome: Progressing   Problem: Nutrition: Goal: Risk of aspiration will decrease Outcome: Progressing Goal: Dietary intake will improve Outcome: Progressing   

## 2022-03-21 NOTE — TOC Initial Note (Signed)
Transition of Care [facility]) - Initial/Assessment Note    Patient Details  Name: [name] MRN: [id number] Date of Birth: 08-26-1965  Transition of Care [facility]) CM/SW Contact:    Pollie Friar, RN Phone Number: 03/21/2022, 4:01 PM  Clinical Narrative:                 Pt is from home with her spouse. They are together most of the time. Pt's son and DIL live next door and can also assist.  Spouse assists with her medications and they deny any issues. They use [facility] on [address] for their pharmacy. Spouse provides needed transportation.  Current recommendations are for home heath services. CM unable to get a [facility] agency to accept with medicaid. Pt and spouse agreeable to outpatient therapy. CM has entered the orders and information is on the AVS. They will have to call and arrange first appointment. Pt and spouse aware.  Spouse will transport home when medically ready.    Expected Discharge Plan: OP Rehab Barriers to Discharge: Continued Medical Work up   Patient Goals and CMS Choice   CMS Medicare.gov Compare Post Acute Care list provided to:: Patient Choice offered to / list presented to : Patient, Spouse      Expected Discharge Plan and Services   Discharge Planning Services: CM Consult   Living arrangements for the past 2 months: Single Family Home                                      Prior Living Arrangements/Services Living arrangements for the past 2 months: Single Family Home Lives with:: Spouse Patient language and need for interpreter reviewed:: Yes Do you feel safe going back to the place where you live?: Yes      Need for Family Participation in Patient Care: Yes (Comment) Care giver support system in place?: Yes (comment) Current home services: DME (cane/ walker/ shower seat) Criminal Activity/Legal Involvement Pertinent to Current Situation/Hospitalization: No - Comment as needed  Activities of Daily Living Home Assistive  Devices/Equipment: Cane (specify quad or straight), Walker (specify type), Stimulators, CBG Meter ADL Screening (condition at time of admission) Patient's cognitive ability adequate to safely complete daily activities?: Yes Is the patient deaf or have difficulty hearing?: No Does the patient have difficulty seeing, even when wearing glasses/contacts?: Yes Does the patient have difficulty concentrating, remembering, or making decisions?: Yes Patient able to express need for assistance with ADLs?: Yes Does the patient have difficulty dressing or bathing?: Yes Independently performs ADLs?: Yes (appropriate for developmental age) Does the patient have difficulty walking or climbing stairs?: No Weakness of Legs: Right Weakness of Arms/Hands: None  Permission Sought/Granted                  Emotional Assessment Appearance:: Appears stated age Attitude/Demeanor/Rapport: Engaged Affect (typically observed): Accepting Orientation: : Oriented to Self, Oriented to Place, Oriented to  Time, Oriented to Situation   Psych Involvement: No (comment)  Admission diagnosis:  Sepsis ([place]) [A41.9] Urinary tract infection without hematuria, site unspecified [N39.0] Pneumonia of both lungs due to infectious organism, unspecified part of lung [J18.9] Sepsis, due to unspecified organism, unspecified whether acute organ dysfunction present [facility]) [A41.9] Patient Active Problem List   Diagnosis Date Noted   Sepsis ([place]) 03/20/2022   Pressure injury of skin 03/28/2021   SIRS (systemic inflammatory response syndrome) ([place]) 03/24/2021   Type 2 diabetes  mellitus with hyperglycemia ([place]) 03/24/2021   Nausea vomiting and diarrhea 03/24/2021   Recurrent pulmonary embolism ([place]) 03/24/2021   Chest pain 03/24/2021   CKD (chronic kidney disease), stage III ([place]) 05/17/2016   Type 2 diabetes mellitus with vascular disease ([place]) 05/17/2016   Essential hypertension 05/17/2016   DVT (deep venous thrombosis) ([place])  05/16/2016   Chronic ischemic left PCA stroke 07/30/2013   TIA (transient ischemic attack) 07/30/2013   Posterior circulation stroke of uncertain pathology ([place]) 07/30/2013   PCP:  Gildardo Pounds, NP Pharmacy:   [facility] [place] [address] Phone: [phone] Fax: [facility] [address], [place] [address] Phone: [phone] Fax: [facility] [address] [place] [address] Phone: [phone] Fax: [phone]     Social Determinants of Health (SDOH) Social History: SDOH Screenings   Food Insecurity: Food Insecurity Present (03/21/2022)  Housing: Low Risk  (03/21/2022)  Transportation Needs: No Transportation Needs (03/21/2022)  Utilities: Not At Risk (03/21/2022)  Depression (PHQ2-9): High Risk (05/01/2021)  Tobacco Use: High Risk (03/20/2022)   SDOH Interventions:     Readmission Risk Interventions     No data to display

## 2022-03-22 ENCOUNTER — Other Ambulatory Visit (HOSPITAL_COMMUNITY): Payer: Self-pay

## 2022-03-22 ENCOUNTER — Inpatient Hospital Stay (HOSPITAL_COMMUNITY): Payer: Medicaid Other

## 2022-03-22 DIAGNOSIS — I639 Cerebral infarction, unspecified: Secondary | ICD-10-CM | POA: Diagnosis not present

## 2022-03-22 DIAGNOSIS — N390 Urinary tract infection, site not specified: Secondary | ICD-10-CM | POA: Diagnosis not present

## 2022-03-22 DIAGNOSIS — I63131 Cerebral infarction due to embolism of right carotid artery: Secondary | ICD-10-CM | POA: Diagnosis not present

## 2022-03-22 DIAGNOSIS — A4150 Gram-negative sepsis, unspecified: Secondary | ICD-10-CM | POA: Diagnosis not present

## 2022-03-22 LAB — CBC WITH DIFFERENTIAL/PLATELET
Abs Immature Granulocytes: 0.08 10*3/uL — ABNORMAL HIGH (ref 0.00–0.07)
Basophils Absolute: 0.1 10*3/uL (ref 0.0–0.1)
Basophils Relative: 1 %
Eosinophils Absolute: 0.4 10*3/uL (ref 0.0–0.5)
Eosinophils Relative: 4 %
HCT: 42.2 % (ref 36.0–46.0)
Hemoglobin: 13.9 g/dL (ref 12.0–15.0)
Immature Granulocytes: 1 %
Lymphocytes Relative: 32 %
Lymphs Abs: 3.5 10*3/uL (ref 0.7–4.0)
MCH: 31.3 pg (ref 26.0–34.0)
MCHC: 32.9 g/dL (ref 30.0–36.0)
MCV: 95 fL (ref 80.0–100.0)
Monocytes Absolute: 0.9 10*3/uL (ref 0.1–1.0)
Monocytes Relative: 8 %
Neutro Abs: 6 10*3/uL (ref 1.7–7.7)
Neutrophils Relative %: 54 %
Platelets: 184 10*3/uL (ref 150–400)
RBC: 4.44 MIL/uL (ref 3.87–5.11)
RDW: 13.5 % (ref 11.5–15.5)
WBC: 11 10*3/uL — ABNORMAL HIGH (ref 4.0–10.5)
nRBC: 0 % (ref 0.0–0.2)

## 2022-03-22 LAB — COMPREHENSIVE METABOLIC PANEL
ALT: 11 U/L (ref 0–44)
AST: 17 U/L (ref 15–41)
Albumin: 3.1 g/dL — ABNORMAL LOW (ref 3.5–5.0)
Alkaline Phosphatase: 82 U/L (ref 38–126)
Anion gap: 13 (ref 5–15)
BUN: 10 mg/dL (ref 6–20)
CO2: 17 mmol/L — ABNORMAL LOW (ref 22–32)
Calcium: 9.1 mg/dL (ref 8.9–10.3)
Chloride: 104 mmol/L (ref 98–111)
Creatinine, Ser: 0.8 mg/dL (ref 0.44–1.00)
GFR, Estimated: >60 mL/min (ref >60)
Glucose, Bld: 237 mg/dL — ABNORMAL HIGH (ref 70–99)
Potassium: 4.3 mmol/L (ref 3.5–5.1)
Sodium: 134 mmol/L — ABNORMAL LOW (ref 135–145)
Total Bilirubin: 1.2 mg/dL (ref 0.3–1.2)
Total Protein: 6.5 g/dL (ref 6.5–8.1)

## 2022-03-22 LAB — GLUCOSE, CAPILLARY
Glucose-Capillary: 283 mg/dL — ABNORMAL HIGH (ref 70–99)
Glucose-Capillary: 237 mg/dL — ABNORMAL HIGH (ref 70–99)
Glucose-Capillary: 201 mg/dL — ABNORMAL HIGH (ref 70–99)

## 2022-03-22 LAB — PHOSPHORUS: Phosphorus: 3.9 mg/dL (ref 2.5–4.6)

## 2022-03-22 LAB — MYCOPLASMA PNEUMONIAE ANTIBODY, IGM: Mycoplasma pneumo IgM: <770 U/mL (ref 0–769)

## 2022-03-22 LAB — MAGNESIUM: Magnesium: 1.9 mg/dL (ref 1.7–2.4)

## 2022-03-22 MED ORDER — ACCU-CHEK SOFTCLIX LANCETS MISC
1.0000 | Freq: Three times a day (TID) | 0 refills | Status: AC
  Filled 2022-03-22: qty 100, 30d supply, fill #0

## 2022-03-22 MED ORDER — LANTUS SOLOSTAR 100 UNIT/ML ~~LOC~~ SOPN
12.0000 [IU] | PEN_INJECTOR | Freq: Every day | SUBCUTANEOUS | 11 refills | Status: DC
  Filled 2022-03-22: qty 3, 25d supply, fill #0

## 2022-03-22 MED ORDER — CEFDINIR 300 MG PO CAPS
300.0000 mg | ORAL_CAPSULE | Freq: Two times a day (BID) | ORAL | 0 refills | Status: AC
  Filled 2022-03-22: qty 10, 5d supply, fill #0

## 2022-03-22 MED ORDER — PAROXETINE HCL 20 MG PO TABS
20.0000 mg | ORAL_TABLET | Freq: Every day | ORAL | 0 refills | Status: DC
  Filled 2022-03-22: qty 30, 30d supply, fill #0

## 2022-03-22 MED ORDER — PRAVASTATIN SODIUM 20 MG PO TABS
20.0000 mg | ORAL_TABLET | Freq: Every day | ORAL | 0 refills | Status: DC
  Filled 2022-03-22: qty 30, 30d supply, fill #0

## 2022-03-22 MED ORDER — NOVOLOG FLEXPEN 100 UNIT/ML ~~LOC~~ SOPN
3.0000 [IU] | PEN_INJECTOR | Freq: Three times a day (TID) | SUBCUTANEOUS | 11 refills | Status: DC
  Filled 2022-03-22: qty 3, 33d supply, fill #0

## 2022-03-22 MED ORDER — CLOPIDOGREL BISULFATE 75 MG PO TABS
75.0000 mg | ORAL_TABLET | Freq: Every day | ORAL | 2 refills | Status: AC
  Filled 2022-03-22: qty 30, 30d supply, fill #0

## 2022-03-22 MED ORDER — FENOFIBRATE 160 MG PO TABS
160.0000 mg | ORAL_TABLET | Freq: Every day | ORAL | 0 refills | Status: DC
  Filled 2022-03-22: qty 30, 30d supply, fill #0

## 2022-03-22 MED ORDER — INSULIN PEN NEEDLE 32G X 4 MM MISC
1.0000 | Freq: Two times a day (BID) | 2 refills | Status: DC
  Filled 2022-03-22: qty 100, 34d supply, fill #0

## 2022-03-22 MED ORDER — BLOOD GLUCOSE TEST VI STRP
1.0000 | ORAL_STRIP | Freq: Three times a day (TID) | 0 refills | Status: AC
  Filled 2022-03-22: qty 100, 34d supply, fill #0

## 2022-03-22 MED ORDER — ACCU-CHEK GUIDE W/DEVICE KIT
1.0000 | PACK | Freq: Three times a day (TID) | 0 refills | Status: DC
  Filled 2022-03-22: qty 1, 1d supply, fill #0

## 2022-03-22 MED ORDER — NICOTINE 14 MG/24HR TD PT24
14.0000 mg | MEDICATED_PATCH | TRANSDERMAL | 0 refills | Status: AC
  Filled 2022-03-22: qty 28, 28d supply, fill #0

## 2022-03-22 NOTE — Progress Notes (Addendum)
Physical Therapy Treatment Patient Details Name: [name] MRN: [id number] DOB: [birth year] Today's Date: 03/22/2022   History of Present Illness Pt is a 57 y.o. female who presented 03/20/22 with chest pain and SOB along with an episode of lightheadedness and slurred speech lasting ~1 hour. MRI of brain revealed small subacute infarct in the posterior right frontal lobe and multiple chronic infarcts, most numerous in the posterior circulation. Pt also admitted for sepsis secondary to UTI. PMH: recurrent PE, CVA, DVT, HTN    PT Comments    Pt is demonstrating gradual improvements in regards to her coordination and balance with standing mobility. However, she remains at risk for falls and benefits from using the RW to improve her safety. Pt needs cues to slow down when turning around and other movements to improve her safety as well. Will continue to follow acutely. Current recommendations remain appropriate.     Recommendations for follow up therapy are one component of a multi-disciplinary discharge planning process, led by the attending physician.  Recommendations may be updated based on patient status, additional functional criteria and insurance authorization.  Follow Up Recommendations  Home health PT     Assistance Recommended at Discharge Frequent or constant Supervision/Assistance  Patient can return home with the following A little help with walking and/or transfers;A little help with bathing/dressing/bathroom;Assistance with cooking/housework;Direct supervision/assist for medications management;Direct supervision/assist for financial management;Assist for transportation;Help with stairs or ramp for entrance   Equipment Recommendations  None recommended by PT    Recommendations for Other Services       Precautions / Restrictions Precautions Precautions: Fall Precaution Comments: watch HR Restrictions Weight Bearing Restrictions: No     Mobility  Bed  Mobility Overal bed mobility: Modified Independent             General bed mobility comments: No assistance needed, HOB elevated    Transfers Overall transfer level: Needs assistance Equipment used: None Transfers: Sit to/from Stand Sit to Stand: Supervision           General transfer comment: Supervision for safety coming to stand from EOB    Ambulation/Gait Ambulation/Gait assistance: Min guard, Supervision Gait Distance (Feet): 170 Feet Assistive device: None, Rolling walker (2 wheels) Gait Pattern/deviations: Step-through pattern, Decreased stride length, Wide base of support Gait velocity: reduced Gait velocity interpretation: 1.31 - 2.62 ft/sec, indicative of limited community ambulator   General Gait Details: Pt demonstrating some improvements in her stability and coordination when ambulating, but she still demonstrates these deficits. Pt with slightly widened BOS today. Slows her gait speed when her dynamic gait balance is challenged when cued to look L <> R and up <> down when using a RW and when not using an AD. Improved stability noted throughout though when using the RW. Min guard assist without AD, supervision with RW. Needs cues to slow down when turning around with the RW due to noted instability as pt was moving too quickly   Stairs             Wheelchair Mobility    Modified Rankin (Stroke Patients Only) Modified Rankin (Stroke Patients Only) Pre-Morbid Rankin Score: Moderate disability Modified Rankin: Moderately severe disability     Balance Overall balance assessment: Needs assistance Sitting-balance support: Feet supported, No upper extremity supported Sitting balance-Leahy Scale: Good     Standing balance support: During functional activity, No upper extremity supported, Bilateral upper extremity supported Standing balance-Leahy Scale: Fair Standing balance comment: Able to ambulate without UE support, but displays  balance deficits.  Improved stability with RW                            Cognition Arousal/Alertness: Awake/alert Behavior During Therapy: WFL for tasks assessed/performed Overall Cognitive Status: History of cognitive impairments - at baseline                                 General Comments: Pt with poor attention span and some decreased safety awareness        Exercises      General Comments General comments (skin integrity, edema, etc.): encouraged use of RW at home; HR up to 141 bpm; educated pt and husband on smoking cessation (but pt reporting she does not wish to quit) and healthier diet      Pertinent Vitals/Pain Pain Assessment Pain Assessment: Faces Faces Pain Scale: No hurt Pain Intervention(s): Monitored during session    Home Living                          Prior Function            PT Goals (current goals can now be found in the care plan section) Acute Rehab PT Goals Patient Stated Goal: to go home PT Goal Formulation: With patient/family Time For Goal Achievement: 04/04/22 Potential to Achieve Goals: Good Progress towards PT goals: Progressing toward goals    Frequency    Min 4X/week      PT Plan Current plan remains appropriate    Co-evaluation              AM-PAC PT "6 Clicks" Mobility   Outcome Measure  Help needed turning from your back to your side while in a flat bed without using bedrails?: None Help needed moving from lying on your back to sitting on the side of a flat bed without using bedrails?: None Help needed moving to and from a bed to a chair (including a wheelchair)?: A Little Help needed standing up from a chair using your arms (e.g., wheelchair or bedside chair)?: A Little Help needed to walk in hospital room?: A Little Help needed climbing 3-5 steps with a railing? : A Little 6 Click Score: 20    End of Session Equipment Utilized During Treatment: Gait belt Activity Tolerance: Patient tolerated  treatment well Patient left: in bed;with call bell/phone within reach;with bed alarm set;with family/visitor present Nurse Communication: Mobility status PT Visit Diagnosis: Unsteadiness on feet (R26.81);Other abnormalities of gait and mobility (R26.89);Muscle weakness (generalized) (M62.81);Other symptoms and signs involving the nervous system (R29.898)     Time: [id number] PT Time Calculation (min) (ACUTE ONLY): 15 min  Charges:  $Gait Training: 8-22 mins                     Moishe Spice, PT, DPT Acute Rehabilitation Services  Office: [place] 03/22/2022, 11:59 AM

## 2022-03-22 NOTE — TOC Transition Note (Signed)
Transition of Care [facility]) - CM/SW Discharge Note   Patient Details  Name: [name] MRN: [id number] Date of Birth: 01-08-1966  Transition of Care [facility]) CM/SW Contact:  Tom-Johnson, Renea Ee, RN Phone Number: 03/22/2022, 4:41 PM   Clinical Narrative:     Patient is scheduled for discharge today. Readmission Prevention Assessment done. Outpatient referral, hospital f/u and discharge instructions on AVS. Prescription sent to [facility] and meds to be delivered to patient at bedside prior to discharge.  Family to transport at discharge. No further TOC needs noted.   Final next level of care: OP Rehab Barriers to Discharge: Barriers Resolved   Patient Goals and CMS Choice CMS Medicare.gov Compare Post Acute Care list provided to:: Patient Choice offered to / list presented to : Patient, Spouse  Discharge Placement                  Patient to be transferred to facility by: Spouse      Discharge Plan and Services Additional resources added to the After Visit Summary for     Discharge Planning Services: CM Consult                                 Social Determinants of Health (SDOH) Interventions SDOH Screenings   Food Insecurity: Food Insecurity Present (03/21/2022)  Housing: Low Risk  (03/21/2022)  Transportation Needs: No Transportation Needs (03/21/2022)  Utilities: Not At Risk (03/21/2022)  Depression (PHQ2-9): High Risk (05/01/2021)  Tobacco Use: High Risk (03/20/2022)     Readmission Risk Interventions    03/22/2022    4:41 PM  Readmission Risk Prevention Plan  Post Dischage Appt Complete  Medication Screening Complete  Transportation Screening Complete

## 2022-03-22 NOTE — Discharge Summary (Signed)
Physician Discharge Summary  [name] [id number] DOB: 1965/12/14 DOA: 03/20/2022  PCP: Gildardo Pounds, NP  Admit date: 03/20/2022 Discharge date: 03/22/2022  Admitted From: Home Disposition: Home with home health  Recommendations for Outpatient Follow-up:  Follow up with PCP in 1-2 weeks Neurology to schedule follow-up, will send referral.  Home Health: PT OT Equipment/Devices: None  Discharge Condition: Stable CODE STATUS: DNR Diet recommendation: Low-salt and low-carb diet  Discharge summary: 57 year old with history of recurrent pulmonary embolism and noncompliant to Eliquis, history of a stroke in 2015, recent COVID-19 infection who presented to the emergency room with slurred speech and lightheadedness noticed for the last several days.  She was found to have a high leukocyte counts, abnormal urine.  Started on antibiotics and admitted to the hospital.  CT head was negative on admission.  Subsequent MRI showed acute stroke.  Remained in the hospital on IV antibiotics.  Seen and followed by neurology.  Sepsis present on admission secondary to UTI: Improved. Currently hemodynamically stabilizing.  Presented with leukocytosis 19.2, tachycardia.  Blood cultures and urine cultures negative so far.  Treated with cefepime.  CT scan abdomen pelvis without evidence of hydronephrosis. 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 Clinical findings, slurred speech for many days.  No focal neurological deficits. CT head findings, normal. MRI of the brain, small subacute infarct in the posterior right frontal lobe.  Multiple chronic infarcts most numerous in the posterior circulation. CT angiogram of the head and neck, moderate less than 50% stenosis. 2D echocardiogram, normal ejection fraction.  No thrombus. Antiplatelet  therapy, on Plavix at home.  Continue Plavix.  Allergy to aspirin. LDL 113.  On cholesterol control medications. Hemoglobin A1c, 11.  See insulin recommendations below. Therapy recommendations, home health PT OT. Patient does have history of rapid A-fib in previous admissions.  She does have a history of DVT in the past.  No active blood clot.  She did not want to take Eliquis.  She is allergic to aspirin. Current plan is to continue Plavix.   Left adrenal mass: 4 to 5 cm left renal mass present since CT scan from 2015.  Multiple repeat CT scans for last 9 years size of the adrenal mass has remained stable. Recent CT scans of the chest, abdomen pelvis with no evidence of malignancy elsewhere.  This is likely benign process as it has not changed in last 9 years.   Type 2 diabetes, uncontrolled with hyperglycemia: Hemoglobin A1c 11.  Patient with logistic problems to acquire insulin so not using as needed.  Will discharge patient on regimen of long-acting insulin and prandial insulin that is covered by Medicaid.   History of recurrent DVT and PE: No recent DVT or PE.  Patient has been very reluctant to take Eliquis.  Will discontinue further.   Diabetic polyneuropathy: On gabapentin.   Smoker: Smoker with severe atherosclerotic disease, stroke.  Recommended smoking cessation.  Nicotine patch.  Stable for discharge.  Outpatient neurology follow-up.   Discharge Diagnoses:  Principal Problem:   Sepsis [facility])    Discharge Instructions  Discharge Instructions     Ambulatory referral to Neurology   Complete by: As directed    An appointment is requested in approximately: 4 weeks   Ambulatory referral to Occupational Therapy   Complete by: As directed    Ambulatory referral to Physical Therapy  Complete by: As directed    Diet - low sodium heart healthy   Complete by: As directed    Diet Carb Modified   Complete by: As directed    Increase activity slowly   Complete by: As  directed       Allergies as of 03/22/2022       Reactions   Amoxicillin Hives   Aspirin Hives   Codeine Hives   Penicillins Hives   Has patient had a PCN reaction causing immediate rash, facial/tongue/throat swelling, SOB or lightheadedness with hypotension: Yes Has patient had a PCN reaction causing severe rash involving mucus membranes or skin necrosis: No Has patient had a PCN reaction that required hospitalization No Has patient had a PCN reaction occurring within the last 10 years: Yes If all of the above answers are "NO", then may proceed with Cephalosporin use.        Medication List     STOP taking these medications    apixaban 5 MG Tabs tablet Commonly known as: Eliquis   budesonide 0.5 MG/2ML nebulizer solution Commonly known as: PULMICORT   busPIRone 10 MG tablet Commonly known as: BUSPAR   diltiazem 180 MG 24 hr capsule Commonly known as: CARDIZEM CD   fluticasone furoate-vilanterol 100-25 MCG/ACT Aepb Commonly known as: BREO ELLIPTA   hydrOXYzine 10 MG tablet Commonly known as: ATARAX   insulin aspart protamine - aspart (70-30) 100 UNIT/ML FlexPen Commonly known as: NOVOLOG 70/30 MIX   levalbuterol 0.63 MG/3ML nebulizer solution Commonly known as: XOPENEX   Vitamin D (Ergocalciferol) 1.25 MG (50000 UNIT) Caps capsule Commonly known as: DRISDOL       TAKE these medications    BD Pen Needle Nano U/F 32G X 4 MM Misc Generic drug: Insulin Pen Needle Use 2 (two) times daily. What changed:  medication strength Another medication with the same name was removed. Continue taking this medication, and follow the directions you see here.   blood glucose meter kit and supplies Kit Use up to 4 times daily as directed.   cefdinir 300 MG capsule Commonly known as: OMNICEF Take 1 capsule (300 mg total) by mouth 2 (two) times daily for 5 days.   clopidogrel 75 MG tablet Commonly known as: PLAVIX Take 1 tablet (75 mg total) by mouth daily. What  changed: additional instructions   fenofibrate 160 MG tablet Take 1 tablet (160 mg total) by mouth daily. What changed: additional instructions   Lantus SoloStar 100 UNIT/ML Solostar Pen Generic drug: insulin glargine Inject 12 Units into the skin daily.   nicotine 14 mg/24hr patch Commonly known as: NICODERM CQ - dosed in mg/24 hours Place 1 patch (14 mg total) onto the skin daily.   NovoLOG FlexPen 100 UNIT/ML FlexPen Generic drug: insulin aspart Inject 3 Units into the skin 3 (three) times daily with meals.   PARoxetine 20 MG tablet Commonly known as: PAXIL Take 1 tablet (20 mg total) by mouth daily.   pravastatin 20 MG tablet Commonly known as: PRAVACHOL Take 1 tablet (20 mg total) by mouth daily. What changed: additional instructions   True Metrix Blood Glucose Test test strip Generic drug: glucose blood Use as instructed. Check blood glucose level by fingerstick 3 times per day. What changed: Another medication with the same name was added. Make sure you understand how and when to take each.   Accu-Chek Guide test strip Generic drug: glucose blood Use 1 test strip in the morning, at noon, and at bedtime. What changed: You were  already taking a medication with the same name, and this prescription was added. Make sure you understand how and when to take each.   True Metrix Meter w/Device Kit Use as instructed. Check blood glucose level by fingerstick 3 times per day. What changed: Another medication with the same name was added. Make sure you understand how and when to take each.   Accu-Chek Guide w/Device Kit Use in the morning, at noon, and at bedtime. What changed: You were already taking a medication with the same name, and this prescription was added. Make sure you understand how and when to take each.   TRUEplus Lancets 28G Misc Use as instructed. Check blood glucose level by fingerstick 3 times per day. What changed: Another medication with the same name was  added. Make sure you understand how and when to take each.   Accu-Chek Softclix Lancets lancets Use in the morning, at noon, and at bedtime. What changed: You were already taking a medication with the same name, and this prescription was added. Make sure you understand how and when to take each.        Follow-up Information     [facility]. Schedule an appointment as soon as possible for a visit in 1 week(s).   Specialty: Rehabilitation Contact information: [place]. [id number] [place] [id number] [phone]               Allergies  Allergen Reactions   Amoxicillin Hives   Aspirin Hives   Codeine Hives   Penicillins Hives    Has patient had a PCN reaction causing immediate rash, facial/tongue/throat swelling, SOB or lightheadedness with hypotension: Yes Has patient had a PCN reaction causing severe rash involving mucus membranes or skin necrosis: No Has patient had a PCN reaction that required hospitalization No Has patient had a PCN reaction occurring within the last 10 years: Yes If all of the above answers are "NO", then may proceed with Cephalosporin use.     Consultations: Neurology   Procedures/Studies: ECHOCARDIOGRAM COMPLETE BUBBLE STUDY  Result Date: 03/21/2022    ECHOCARDIOGRAM REPORT   Patient Name:   NISHITHA DUDDY Date of Exam: 03/21/2022 Medical Rec #:  [id number]         Height:       66.0 in Accession #:    [id number]        Weight:       119.0 lb Date of Birth:  1965-06-12         BSA:          1.604 m Patient Age:    57 years          BP:           121/73 mmHg Patient Gender: F                 HR:           115 bpm. Exam Location:  Inpatient Procedure: 2D Echo and Saline Contrast Bubble Study Indications:    stroke  History:        Patient has prior history of Echocardiogram examinations, most                 recent 03/24/2021. Stroke; Risk Factors:Diabetes and                  Hypertension.  Sonographer:    Harvie Junior Referring Phys: Kayleen Memos  Sonographer Comments: Technically difficult  study due to poor echo windows. IMPRESSIONS  1. Left ventricular ejection fraction, by estimation, is 70 to 75%. The left ventricle has hyperdynamic function. The left ventricle has no regional wall motion abnormalities. Left ventricular diastolic parameters are indeterminate.  2. Right ventricular systolic function is normal. The right ventricular size is normal. There is normal pulmonary artery systolic pressure. The estimated right ventricular systolic pressure is [id number] mmHg.  3. Post-inflammatory apppearance of the mitral valve. Challenging to interpret gradients in the setting of tachycardia. The posterior leaflet does appear fixed, suggesting there is likely at least mild mitral stenosis at a normal heart rate. The mitral valve is abnormal. No evidence of mitral valve regurgitation. No evidence of mitral stenosis.  4. The aortic valve is grossly normal. Aortic valve regurgitation is not visualized. No aortic stenosis is present.  5. The inferior vena cava is normal in size with greater than 50% respiratory variability, suggesting right atrial pressure of 3 mmHg.  6. Cannot exclude a small PFO by color flow Doppler. Agitated saline contrast bubble study was negative, with no evidence of any right to left interatrial shunt. FINDINGS  Left Ventricle: Left ventricular ejection fraction, by estimation, is 70 to 75%. The left ventricle has hyperdynamic function. The left ventricle has no regional wall motion abnormalities. The left ventricular internal cavity size was normal in size. There is no left ventricular hypertrophy. Left ventricular diastolic parameters are indeterminate. Right Ventricle: The right ventricular size is normal. No increase in right ventricular wall thickness. Right ventricular systolic function is normal. There is normal pulmonary artery systolic pressure. The tricuspid  regurgitant velocity is 2.38 m/s, and  with an assumed right atrial pressure of 3 mmHg, the estimated right ventricular systolic pressure is [id number] mmHg. Left Atrium: Left atrial size was normal in size. Right Atrium: Right atrial size was normal in size. Pericardium: There is no evidence of pericardial effusion. Mitral Valve: Post-inflammatory apppearance of the mitral valve. Challenging to interpret gradients in the setting of tachycardia. The posterior leaflet does appear fixed, suggesting there is likely at least mild mitral stenosis at a normal heart rate. The mitral valve is abnormal. No evidence of mitral valve regurgitation. No evidence of mitral valve stenosis. The mean mitral valve gradient is 7.4 mmHg with average heart rate of 115 bpm. Tricuspid Valve: The tricuspid valve is normal in structure. Tricuspid valve regurgitation is mild . No evidence of tricuspid stenosis. Aortic Valve: The aortic valve is grossly normal. Aortic valve regurgitation is not visualized. No aortic stenosis is present. Aortic valve mean gradient measures 5.5 mmHg. Aortic valve peak gradient measures 10.2 mmHg. Aortic valve area, by VTI measures  2.24 cm. Pulmonic Valve: The pulmonic valve was normal in structure. Pulmonic valve regurgitation is not visualized. No evidence of pulmonic stenosis. Aorta: The aortic root is normal in size and structure. Venous: The inferior vena cava is normal in size with greater than 50% respiratory variability, suggesting right atrial pressure of 3 mmHg. IAS/Shunts: Cannot exclude a small PFO. Agitated saline contrast was given intravenously to evaluate for intracardiac shunting. Agitated saline contrast bubble study was negative, with no evidence of any interatrial shunt.  LEFT VENTRICLE PLAX 2D LVIDd:         3.65 cm     Diastology LVIDs:         2.15 cm     LV e' medial:    5.06 cm/s LV PW:         0.85 cm  LV E/e' medial:  26.8 LV IVS:        0.85 cm     LV e' lateral:   7.61 cm/s LVOT diam:      1.80 cm     LV E/e' lateral: 17.8 LV SV:         54 LV SV Index:   34 LVOT Area:     2.54 cm                             3D Volume EF: LV Volumes (MOD)           LV EDV:       48 ml LV vol d, MOD A2C: 34.0 ml LV ESV:       22 ml LV vol d, MOD A4C: 77.1 ml LV SV:        26 ml LV vol s, MOD A2C: 11.8 ml LV vol s, MOD A4C: 35.7 ml LV SV MOD A2C:     22.2 ml LV SV MOD A4C:     77.1 ml LV SV MOD BP:      31.1 ml RIGHT VENTRICLE RV Basal diam:  3.00 cm RV Mid diam:    2.00 cm RV S prime:     17.55 cm/s TAPSE (M-mode): 2.0 cm LEFT ATRIUM           Index        RIGHT ATRIUM          Index LA diam:      3.00 cm 1.87 cm/m   RA Area:     7.02 cm LA Vol (A4C): 32.9 ml 20.51 ml/m  RA Volume:   12.40 ml 7.73 ml/m  AORTIC VALVE                     PULMONIC VALVE AV Area (Vmax):    2.34 cm      PV Vmax:       1.39 m/s AV Area (Vmean):   2.31 cm      PV Peak grad:  7.7 mmHg AV Area (VTI):     2.24 cm AV Vmax:           160.00 cm/s AV Vmean:          107.700 cm/s AV VTI:            0.243 m AV Peak Grad:      10.2 mmHg AV Mean Grad:      5.5 mmHg LVOT Vmax:         147.00 cm/s LVOT Vmean:        97.800 cm/s LVOT VTI:          0.214 m LVOT/AV VTI ratio: 0.88  AORTA Ao Root diam: 3.10 cm Ao Asc diam:  3.30 cm MITRAL VALVE                TRICUSPID VALVE MV Area (PHT): 6.17 cm     TR Peak grad:   22.7 mmHg MV Mean grad:  7.4 mmHg     TR Vmax:        238.00 cm/s MV Decel Time: 123 msec MR Peak grad: 51.0 mmHg     SHUNTS MR Vmax:      357.00 cm/s   Systemic VTI:  0.21 m MV E velocity: 135.50 cm/s  Systemic Diam: 1.80 cm MV A velocity: 226.00 cm/s MV E/A ratio:  0.60 Cherlynn Kaiser  MD Electronically signed by Cherlynn Kaiser MD Signature Date/Time: 03/21/2022/10:13:57 PM    Final    CT ANGIO HEAD NECK W WO CM  Result Date: 03/21/2022 CLINICAL DATA:  Transient ischemic attack. EXAM: CT HEAD WITHOUT CONTRAST CT ANGIOGRAPHY OF THE HEAD AND NECK TECHNIQUE: Contiguous axial images were obtained from the base of the skull through  the vertex without intravenous contrast. Multidetector CT imaging of the head and neck was performed using the standard protocol during bolus administration of intravenous contrast. Multiplanar CT image reconstructions and MIPs were obtained to evaluate the vascular anatomy. Carotid stenosis measurements (when applicable) are obtained utilizing NASCET criteria, using the distal internal carotid diameter as the denominator. RADIATION DOSE REDUCTION: This exam was performed according to the departmental dose-optimization program which includes automated exposure control, adjustment of the mA and/or kV according to patient size and/or use of iterative reconstruction technique. CONTRAST:  31m OMNIPAQUE IOHEXOL 350 MG/ML SOLN COMPARISON:  MRI brain 03/21/2022.  Head CT 03/20/2022. FINDINGS: CT HEAD Brain: Subtle asymmetric subcortical hypoattenuation along the inferior aspect of the right postcentral gyrus, corresponding to subacute infarct seen on same day brain MRI. No acute intracranial hemorrhage, mass effect or midline shift. Unchanged old infarcts in the bilateral occipital lobes, left posterior insula and left cerebellar hemisphere. Vascular: No hyperdense vessel or unexpected calcification. Skull: No calvarial fracture or suspicious bone lesion. Skull base is unremarkable. Sinuses/Orbits: Unchanged mild paranasal sinus disease. CTA NECK Aortic arch: Three-vessel arch configuration. Atherosclerotic calcifications of the aortic arch and arch vessel origins. Arch vessel origins are patent. Right carotid system: The common and internal carotid arteries are patent to the skull base without stenosis, aneurysm or dissection. Left carotid system: Mild mixed plaque along the left carotid bulb and proximal left cervical ICA without stenosis. Vertebral arteries:Patent from the origin to the confluence with the basilar without stenosis or dissection. Skeleton: Mild degenerative changes of the lower cervical spine without  high-grade spinal canal stenosis. No suspicious bone lesions. Other neck: Moderate emphysema in the lung apices. CTA HEAD Anterior circulation: Intracranial ICAs are patent without stenosis or aneurysm. The proximal ACAs and MCAs are patent without stenosis or aneurysm. Distal branches are symmetric. Posterior circulation: Normal basilar artery. The SCAs, AICAs and PICAs are patent proximally. The PCAs are patent proximally without stenosis or aneurysm. Distal branches are symmetric. Venous sinuses: As permitted by early phase of contrast, patent. Anatomic variants: None. IMPRESSION: 1. Subtle asymmetric subcortical hypoattenuation along the inferior aspect of the right postcentral gyrus, corresponding to subacute infarct seen on same day brain MRI. No acute intracranial hemorrhage or mass effect. 2. No large vessel occlusion or significant stenosis in the head or neck vessels. 3. Mild mixed plaque along the left carotid bulb and proximal left cervical ICA. 4. Aortic Atherosclerosis (ICD10-I70.0). Electronically Signed   By: WEmmit AlexandersM.D.   On: 03/21/2022 19:42   MR BRAIN WO CONTRAST  Result Date: 03/21/2022 CLINICAL DATA:  TIA. History of recurrent pulmonary embolism on Eliquis EXAM: MRI HEAD WITHOUT CONTRAST TECHNIQUE: Multiplanar, multiecho pulse sequences of the brain and surrounding structures were obtained without intravenous contrast. COMPARISON:  Head CT from yesterday FINDINGS: Brain: Small band of low-grade restricted diffusion in the right posterior frontal white matter. Chronic bilateral occipital and cerebellar infarcts. Chronic bilateral thalamic lacunar infarcts. Chronic posterior left insular cortex infarct. No acute hemorrhage, hydrocephalus, or collection. Brain volume is normal for age. 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 Skull and upper cervical spine: Normal marrow signal  Sinuses/Orbits: Mucosal thickening in the left maxillary sinus. Opacified left posterior ethmoid air cell. Other: Intermittent motion artifact. IMPRESSION: 1. Small subacute infarct in the posterior right frontal lobe. 2. Multiple chronic infarcts, most numerous in the posterior circulation. Electronically Signed   By: Jorje Guild M.D.   On: 03/21/2022 05:24   CT Angio Chest PE W and/or Wo Contrast  Result Date: 03/20/2022 CLINICAL DATA:  Pulmonary embolism (PE) suspected, high prob; Sepsis Abdominal pain, acute, nonlocalized EXAM: CT ANGIOGRAPHY CHEST CT ABDOMEN AND PELVIS WITH CONTRAST TECHNIQUE: Multidetector CT imaging of the chest was performed using the standard protocol during bolus administration of intravenous contrast. Multiplanar CT image reconstructions and MIPs were obtained to evaluate the vascular anatomy. Multidetector CT imaging of the abdomen and pelvis was performed using the standard protocol during bolus administration of intravenous contrast. RADIATION DOSE REDUCTION: This exam was performed according to the departmental dose-optimization program which includes automated exposure control, adjustment of the mA and/or kV according to patient size and/or use of iterative reconstruction technique. CONTRAST:  169m OMNIPAQUE IOHEXOL 350 MG/ML SOLN COMPARISON:  Chest XR, 03/24/2021.  CTA chest, 03/23/2021. FINDINGS: CTA CHEST FINDINGS Cardiovascular: Satisfactory opacification of the pulmonary arteries to the segmental level. No segmental or larger pulmonary embolus. Normal heart size. No pericardial effusion. Mild burden of calcified and noncalcified atherosclerosis of the aortic arch without hemodynamically-significant stenosis at the great vessel origins. Mediastinum/Nodes: No enlarged mediastinal, hilar, or axillary lymph nodes. Thyroid gland, trachea, and esophagus demonstrate no significant findings. Lungs/Pleura: Mild-to-moderate burden of centrilobular emphysematous lung change,  greatest within the apices. Bibasilar tree-in-bud nodularities, increasing conspicuity since 03/2021 comparison. Punctate LEFT posterior basilar calcified granuloma. No pleural effusion or pneumothorax. Musculoskeletal: No acute chest wall abnormality. Review of the MIP images confirms the above findings. CT ABDOMEN and PELVIS FINDINGS Hepatobiliary: Small perfusion defect at the anterior RIGHT hepatic lobe. No discrete focal liver abnormality. No gallstones, gallbladder wall thickening, or biliary dilatation. Pancreas: No pancreatic ductal dilatation or surrounding inflammatory changes. Spleen: Normal in size without focal abnormality. Adrenals/Urinary Tract: Heterogeneously dense LEFT adrenal mass with calcifications, measuring approximately 5.1 x 5.7 x 5.5 cm (AP by transaxial by CC) The RIGHT adrenal glands is unremarkable. Kidneys are normal, without renal calculi, focal lesion, or hydronephrosis. Bladder is unremarkable. Stomach/Bowel: Stomach is within normal limits. Appendix appears normal. No evidence of bowel wall thickening, distention, or inflammatory changes. Vascular/Lymphatic: Aortic atherosclerosis without aneurysmal dilatation. No enlarged abdominal or pelvic lymph nodes. Reproductive: Uterus and adnexa are unremarkable. Asymmetric, mild fluid distention of the LEFT vaginal fornix. Other: No abdominal wall hernia. No abdominopelvic ascites. Musculoskeletal: Multifocal subcutaneous calcifications within the LEFT gluteus soft tissues, likely injection granulomas. No acute or significant osseous findings. Review of the MIP images confirms the above findings. IMPRESSION: 1. No segmental or larger pulmonary embolus. 2. Apical-predominant centrilobular emphysematous lung change with superimposed bibasilar tree-in-bud nodularities. Superimposed infection can not be excluded. 3. 5.7 cm heterogeneously dense LEFT adrenal mass. Consider non-emergent, outpatient multiphasic MRI for further evaluation. 4. No  acute abdominopelvic process. 5. Aortic Atherosclerosis (ICD10-I70.0) and Emphysema (ICD10-J43.9). Additional incidental, chronic and senescent findings as above. Electronically Signed   By: JMichaelle BirksM.D.   On: 03/20/2022 16:42   CT ABDOMEN PELVIS W CONTRAST  Result Date: 03/20/2022 CLINICAL DATA:  Pulmonary embolism (PE) suspected, high prob; Sepsis Abdominal pain, acute, nonlocalized EXAM: CT ANGIOGRAPHY CHEST CT ABDOMEN AND PELVIS WITH CONTRAST  TECHNIQUE: Multidetector CT imaging of the chest was performed using the standard protocol during bolus administration of intravenous contrast. Multiplanar CT image reconstructions and MIPs were obtained to evaluate the vascular anatomy. Multidetector CT imaging of the abdomen and pelvis was performed using the standard protocol during bolus administration of intravenous contrast. RADIATION DOSE REDUCTION: This exam was performed according to the departmental dose-optimization program which includes automated exposure control, adjustment of the mA and/or kV according to patient size and/or use of iterative reconstruction technique. CONTRAST:  169m OMNIPAQUE IOHEXOL 350 MG/ML SOLN COMPARISON:  Chest XR, 03/24/2021.  CTA chest, 03/23/2021. FINDINGS: CTA CHEST FINDINGS Cardiovascular: Satisfactory opacification of the pulmonary arteries to the segmental level. No segmental or larger pulmonary embolus. Normal heart size. No pericardial effusion. Mild burden of calcified and noncalcified atherosclerosis of the aortic arch without hemodynamically-significant stenosis at the great vessel origins. Mediastinum/Nodes: No enlarged mediastinal, hilar, or axillary lymph nodes. Thyroid gland, trachea, and esophagus demonstrate no significant findings. Lungs/Pleura: Mild-to-moderate burden of centrilobular emphysematous lung change, greatest within the apices. Bibasilar tree-in-bud nodularities, increasing conspicuity since 03/2021 comparison. Punctate LEFT posterior basilar  calcified granuloma. No pleural effusion or pneumothorax. Musculoskeletal: No acute chest wall abnormality. Review of the MIP images confirms the above findings. CT ABDOMEN and PELVIS FINDINGS Hepatobiliary: Small perfusion defect at the anterior RIGHT hepatic lobe. No discrete focal liver abnormality. No gallstones, gallbladder wall thickening, or biliary dilatation. Pancreas: No pancreatic ductal dilatation or surrounding inflammatory changes. Spleen: Normal in size without focal abnormality. Adrenals/Urinary Tract: Heterogeneously dense LEFT adrenal mass with calcifications, measuring approximately 5.1 x 5.7 x 5.5 cm (AP by transaxial by CC) The RIGHT adrenal glands is unremarkable. Kidneys are normal, without renal calculi, focal lesion, or hydronephrosis. Bladder is unremarkable. Stomach/Bowel: Stomach is within normal limits. Appendix appears normal. No evidence of bowel wall thickening, distention, or inflammatory changes. Vascular/Lymphatic: Aortic atherosclerosis without aneurysmal dilatation. No enlarged abdominal or pelvic lymph nodes. Reproductive: Uterus and adnexa are unremarkable. Asymmetric, mild fluid distention of the LEFT vaginal fornix. Other: No abdominal wall hernia. No abdominopelvic ascites. Musculoskeletal: Multifocal subcutaneous calcifications within the LEFT gluteus soft tissues, likely injection granulomas. No acute or significant osseous findings. Review of the MIP images confirms the above findings. IMPRESSION: 1. No segmental or larger pulmonary embolus. 2. Apical-predominant centrilobular emphysematous lung change with superimposed bibasilar tree-in-bud nodularities. Superimposed infection can not be excluded. 3. 5.7 cm heterogeneously dense LEFT adrenal mass. Consider non-emergent, outpatient multiphasic MRI for further evaluation. 4. No acute abdominopelvic process. 5. Aortic Atherosclerosis (ICD10-I70.0) and Emphysema (ICD10-J43.9). Additional incidental, chronic and senescent  findings as above. Electronically Signed   By: JMichaelle BirksM.D.   On: 03/20/2022 16:42   CT Head Wo Contrast  Result Date: 03/20/2022 CLINICAL DATA:  Neuro deficit, acute, stroke suspected. EXAM: CT HEAD WITHOUT CONTRAST TECHNIQUE: Contiguous axial images were obtained from the base of the skull through the vertex without intravenous contrast. RADIATION DOSE REDUCTION: This exam was performed according to the departmental dose-optimization program which includes automated exposure control, adjustment of the mA and/or kV according to patient size and/or use of iterative reconstruction technique. COMPARISON:  Head CT 07/10/2017.  MRI brain 07/31/2013. FINDINGS: Brain: Interval development of encephalomalacia in the medial right occipital lobe, consistent with prior infarct. 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 No acute intracranial hemorrhage. No hydrocephalus or extra-axial collection. No mass effect or midline shift. Vascular: No hyperdense vessel or unexpected calcification. Skull: No calvarial fracture or  suspicious bone lesion. Skull base is unremarkable. Sinuses/Orbits: Chronic left sphenoid sinusitis. Mild mucosal disease in the left maxillary and right sphenoid sinuses. Orbits are unremarkable. Other: None. IMPRESSION: 1. No acute intracranial hemorrhage. 2. Interval development of encephalomalacia in the medial right occipital lobe, consistent with remote prior infarct. 3. 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 Electronically Signed   By: Emmit Alexanders M.D.   On: 03/20/2022 16:22   (Echo, Carotid, EGD, Colonoscopy, ERCP)    Subjective: Patient seen and examined.  Seen with multiple family members at the bedside.  Husband.  Son.  Daughter-in-law's.  Multiple questions answered.  Patient denies any complaints.  She wants to leave as soon as possible.  I think she is  craving for smoking.   Discharge Exam: Vitals:   03/22/22 1133 03/22/22 1635  BP: 120/78 132/82  Pulse: (!) 107 (!) 103  Resp:  20  Temp: 98.4 F (36.9 C) 98.2 F (36.8 C)  SpO2: 97% 99%   Vitals:   03/22/22 0859 03/22/22 0900 03/22/22 1133 03/22/22 1635  BP:   120/78 132/82  Pulse:  (!) 107 (!) 107 (!) 103  Resp:  20  20  Temp:   98.4 F (36.9 C) 98.2 F (36.8 C)  TempSrc:   Oral Oral  SpO2: 98% 98% 97% 99%  Weight:      Height:        General: Pt is alert, awake, not in acute distress Cardiovascular: RRR, S1/S2 +, no rubs, no gallops Respiratory: CTA bilaterally, no wheezing, no rhonchi Abdominal: Soft, NT, ND, bowel sounds + Extremities: no edema, no cyanosis No neurological deficits.  Walking around in the hallway.   The results of significant diagnostics from this hospitalization (including imaging, microbiology, ancillary and laboratory) are listed below for reference.     Microbiology: Recent Results (from the past 240 hour(s))  Blood culture (routine x 2)     Status: None (Preliminary result)   Collection Time: 03/20/22  3:00 PM   Specimen: BLOOD  Result Value Ref Range Status   Specimen Description   Final    BLOOD LEFT ANTECUBITAL Performed at [facility], [address] [place], [place] [address]    Special Requests   Final    Blood Culture adequate volume BOTTLES DRAWN AEROBIC AND ANAEROBIC Performed at [facility], [address] [place], [place] [address]    Culture   Final    NO GROWTH 2 DAYS Performed at [facility], [place] [address] [place], [place] [address]    Report Status PENDING  Incomplete  Blood culture (routine x 2)     Status: None (Preliminary result)   Collection Time: 03/20/22  3:07 PM   Specimen: BLOOD RIGHT FOREARM  Result Value Ref Range Status   Specimen Description   Final    BLOOD RIGHT FOREARM Performed at [facility], [place] [address], [place], [place] [address]    Special  Requests   Final    Blood Culture adequate volume BOTTLES DRAWN AEROBIC AND ANAEROBIC Performed at [facility], [address] [place], [place] [address]    Culture   Final    NO GROWTH 2 DAYS Performed at [facility], [place] [address], [place], [place] [address]    Report Status PENDING  Incomplete  Resp panel by RT-PCR (RSV, Flu A&B, Covid) Anterior Nasal Swab  Status: None   Collection Time: 03/20/22  4:38 PM   Specimen: Anterior Nasal Swab  Result Value Ref Range Status   SARS Coronavirus 2 by RT PCR NEGATIVE NEGATIVE Final    Comment: (NOTE) SARS-CoV-2 target nucleic acids are NOT DETECTED.  The SARS-CoV-2 RNA is generally detectable in upper respiratory specimens during the acute phase of infection. The lowest concentration of SARS-CoV-2 viral copies this assay can detect is 138 copies/mL. A negative result does not preclude SARS-Cov-2 infection and should not be used as the sole basis for treatment or other patient management decisions. A negative result may occur with  improper specimen collection/handling, submission of specimen other than nasopharyngeal swab, presence of viral mutation(s) within the areas targeted by this assay, and inadequate number of viral copies(<138 copies/mL). A negative result must be combined with clinical observations, patient history, and epidemiological information. The expected result is Negative.  Fact Sheet for Patients:  EntrepreneurPulse.com.au  Fact Sheet for Healthcare Providers:  IncredibleEmployment.be  This test is no t yet approved or cleared by the [place] FDA and  has been authorized for detection and/or diagnosis of SARS-CoV-2 by FDA under an Emergency Use Authorization (EUA). This EUA will remain  in effect (meaning this test can be used) for the duration of the COVID-19 declaration under Section 564(b)(1) of the Act, 21 U.S.C.section 360bbb-3(b)(1), unless the  authorization is terminated  or revoked sooner.       Influenza A by PCR NEGATIVE NEGATIVE Final   Influenza B by PCR NEGATIVE NEGATIVE Final    Comment: (NOTE) The Xpert Xpress SARS-CoV-2/FLU/RSV plus assay is intended as an aid in the diagnosis of influenza from Nasopharyngeal swab specimens and should not be used as a sole basis for treatment. Nasal washings and aspirates are unacceptable for Xpert Xpress SARS-CoV-2/FLU/RSV testing.  Fact Sheet for Patients: EntrepreneurPulse.com.au  Fact Sheet for Healthcare Providers: IncredibleEmployment.be  This test is not yet approved or cleared by the [place] FDA and has been authorized for detection and/or diagnosis of SARS-CoV-2 by FDA under an Emergency Use Authorization (EUA). This EUA will remain in effect (meaning this test can be used) for the duration of the COVID-19 declaration under Section 564(b)(1) of the Act, 21 U.S.C. section 360bbb-3(b)(1), unless the authorization is terminated or revoked.     Resp Syncytial Virus by PCR NEGATIVE NEGATIVE Final    Comment: (NOTE) Fact Sheet for Patients: EntrepreneurPulse.com.au  Fact Sheet for Healthcare Providers: IncredibleEmployment.be  This test is not yet approved or cleared by the [place] FDA and has been authorized for detection and/or diagnosis of SARS-CoV-2 by FDA under an Emergency Use Authorization (EUA). This EUA will remain in effect (meaning this test can be used) for the duration of the COVID-19 declaration under Section 564(b)(1) of the Act, 21 U.S.C. section 360bbb-3(b)(1), unless the authorization is terminated or revoked.  Performed at [facility], [address] [place], [place] [address]      Labs: BNP (last 3 results) Recent Labs    03/23/21 1631 03/20/22 1507  BNP 139.2* 0000000   Basic Metabolic Panel: Recent Labs  Lab 03/20/22 1507 03/20/22 1515  03/21/22 0257 03/22/22 0320  NA 130* 133* 137 134*  K 3.7 4.9 3.4* 4.3  CL 97*  --  102 104  CO2 22  --  22 17*  GLUCOSE 313*  --  130* 237*  BUN 14  --  8 10  CREATININE 0.81  --  0.65 0.80  CALCIUM 9.0  --  9.0 9.1  MG  --   --  1.8 1.9  PHOS  --   --  3.7 3.9   Liver Function Tests: Recent Labs  Lab 03/20/22 1507 03/21/22 0257 03/22/22 0320  AST 12* 10* 17  ALT '11 9 11  '$ ALKPHOS 94 82 82  BILITOT 0.6 0.5 1.2  PROT 7.1 6.1* 6.5  ALBUMIN 3.5 3.0* 3.1*   No results for input(s): "LIPASE", "AMYLASE" in the last 168 hours. No results for input(s): "AMMONIA" in the last 168 hours. CBC: Recent Labs  Lab 03/20/22 1507 03/20/22 1515 03/20/22 2058 03/21/22 0257 03/22/22 0320  WBC 18.1*  --  19.2* 13.6* 11.0*  NEUTROABS 14.3*  --   --   --  6.0  HGB 15.2* 16.0* 13.1 13.7 13.9  HCT 44.8 47.0* 39.6 40.3 42.2  MCV 94.3  --  94.7 94.4 95.0  PLT 298  --  260 247 184   Cardiac Enzymes: No results for input(s): "CKTOTAL", "CKMB", "CKMBINDEX", "TROPONINI" in the last 168 hours. BNP: Invalid input(s): "POCBNP" CBG: Recent Labs  Lab 03/21/22 1616 03/21/22 2132 03/22/22 0610 03/22/22 1155 03/22/22 1631  GLUCAP 239* 311* 283* 237* 201*   D-Dimer No results for input(s): "DDIMER" in the last 72 hours. Hgb A1c Recent Labs    03/20/22 1830  HGBA1C 11.4*   Lipid Profile Recent Labs    03/21/22 0257  CHOL 170  HDL 33*  LDLCALC 113*  TRIG 118  CHOLHDL 5.2   Thyroid function studies No results for input(s): "TSH", "T4TOTAL", "T3FREE", "THYROIDAB" in the last 72 hours.  Invalid input(s): "FREET3" Anemia work up No results for input(s): "VITAMINB12", "FOLATE", "FERRITIN", "TIBC", "IRON", "RETICCTPCT" in the last 72 hours. Urinalysis    Component Value Date/Time   COLORURINE YELLOW 03/20/2022 1638   APPEARANCEUR CLOUDY (A) 03/20/2022 1638   LABSPEC 1.015 03/20/2022 1638   PHURINE 6.0 03/20/2022 1638   GLUCOSEU >=500 (A) 03/20/2022 1638   GLUCOSEU NEGATIVE  05/29/2006 1222   HGBUR MODERATE (A) 03/20/2022 1638   BILIRUBINUR NEGATIVE 03/20/2022 1638   KETONESUR NEGATIVE 03/20/2022 1638   PROTEINUR 30 (A) 03/20/2022 1638   UROBILINOGEN 0.2 05/25/2014 1113   NITRITE NEGATIVE 03/20/2022 1638   LEUKOCYTESUR LARGE (A) 03/20/2022 1638   Sepsis Labs Recent Labs  Lab 03/20/22 1507 03/20/22 2058 03/21/22 0257 03/22/22 0320  WBC 18.1* 19.2* 13.6* 11.0*   Microbiology Recent Results (from the past 240 hour(s))  Blood culture (routine x 2)     Status: None (Preliminary result)   Collection Time: 03/20/22  3:00 PM   Specimen: BLOOD  Result Value Ref Range Status   Specimen Description   Final    BLOOD LEFT ANTECUBITAL Performed at [facility], [address] [place], [place] [address]    Special Requests   Final    Blood Culture adequate volume BOTTLES DRAWN AEROBIC AND ANAEROBIC Performed at [facility], [address] [place], [place] [address]    Culture   Final    NO GROWTH 2 DAYS Performed at [facility], [place] [address] [place], [place] [address]    Report Status PENDING  Incomplete  Blood culture (routine x 2)     Status: None (Preliminary result)   Collection Time: 03/20/22  3:07 PM   Specimen: BLOOD RIGHT FOREARM  Result Value Ref Range Status   Specimen Description   Final    BLOOD RIGHT FOREARM Performed at [facility], [place]  [address] [place], [place] [address]    Special Requests   Final    Blood Culture adequate volume BOTTLES DRAWN AEROBIC AND ANAEROBIC Performed at [facility], [address] [place], [place] [address]    Culture   Final    NO GROWTH 2 DAYS Performed at [facility], [place] [address] [place], [place] [address]    Report Status PENDING  Incomplete  Resp panel by RT-PCR (RSV, Flu A&B, Covid) Anterior Nasal Swab     Status: None   Collection Time: 03/20/22  4:38 PM   Specimen: Anterior Nasal Swab  Result Value Ref Range Status   SARS  Coronavirus 2 by RT PCR NEGATIVE NEGATIVE Final    Comment: (NOTE) SARS-CoV-2 target nucleic acids are NOT DETECTED.  The SARS-CoV-2 RNA is generally detectable in upper respiratory specimens during the acute phase of infection. The lowest concentration of SARS-CoV-2 viral copies this assay can detect is 138 copies/mL. A negative result does not preclude SARS-Cov-2 infection and should not be used as the sole basis for treatment or other patient management decisions. A negative result may occur with  improper specimen collection/handling, submission of specimen other than nasopharyngeal swab, presence of viral mutation(s) within the areas targeted by this assay, and inadequate number of viral copies(<138 copies/mL). A negative result must be combined with clinical observations, patient history, and epidemiological information. The expected result is Negative.  Fact Sheet for Patients:  EntrepreneurPulse.com.au  Fact Sheet for Healthcare Providers:  IncredibleEmployment.be  This test is no t yet approved or cleared by the [place] FDA and  has been authorized for detection and/or diagnosis of SARS-CoV-2 by FDA under an Emergency Use Authorization (EUA). This EUA will remain  in effect (meaning this test can be used) for the duration of the COVID-19 declaration under Section 564(b)(1) of the Act, 21 U.S.C.section 360bbb-3(b)(1), unless the authorization is terminated  or revoked sooner.       Influenza A by PCR NEGATIVE NEGATIVE Final   Influenza B by PCR NEGATIVE NEGATIVE Final    Comment: (NOTE) The Xpert Xpress SARS-CoV-2/FLU/RSV plus assay is intended as an aid in the diagnosis of influenza from Nasopharyngeal swab specimens and should not be used as a sole basis for treatment. Nasal washings and aspirates are unacceptable for Xpert Xpress SARS-CoV-2/FLU/RSV testing.  Fact Sheet for  Patients: EntrepreneurPulse.com.au  Fact Sheet for Healthcare Providers: IncredibleEmployment.be  This test is not yet approved or cleared by the [place] FDA and has been authorized for detection and/or diagnosis of SARS-CoV-2 by FDA under an Emergency Use Authorization (EUA). This EUA will remain in effect (meaning this test can be used) for the duration of the COVID-19 declaration under Section 564(b)(1) of the Act, 21 U.S.C. section 360bbb-3(b)(1), unless the authorization is terminated or revoked.     Resp Syncytial Virus by PCR NEGATIVE NEGATIVE Final    Comment: (NOTE) Fact Sheet for Patients: EntrepreneurPulse.com.au  Fact Sheet for Healthcare Providers: IncredibleEmployment.be  This test is not yet approved or cleared by the [place] FDA and has been authorized for detection and/or diagnosis of SARS-CoV-2 by FDA under an Emergency Use Authorization (EUA). This EUA will remain in effect (meaning this test can be used) for the duration of the COVID-19 declaration under Section 564(b)(1) of the Act, 21 U.S.C. section 360bbb-3(b)(1), unless the authorization is terminated or revoked.  Performed at [facility], [address] [place], [place] [address]      Time  coordinating discharge:  35 minutes  SIGNED:   Barb Merino, MD  [facility] 03/22/2022, 5:53 PM

## 2022-03-22 NOTE — Inpatient Diabetes Management (Signed)
Inpatient Diabetes Program Recommendations  AACE/ADA: New Consensus Statement on Inpatient Glycemic Control (2015)  Target Ranges:  Prepandial:   less than 140 mg/dL      Peak postprandial:   less than 180 mg/dL (1-2 hours)      Critically ill patients:  140 - 180 mg/dL   Lab Results  Component Value Date   GLUCAP 237 (H) 03/22/2022   HGBA1C 11.4 (H) 03/20/2022    Latest Reference Range & Units 03/21/22 12:21 03/21/22 16:16 03/21/22 21:32 03/22/22 06:10 03/22/22 11:55  Glucose-Capillary 70 - 99 mg/dL 341 (H) 239 (H) 311 (H) 283 (H) 237 (H)  (H): Data is abnormally high Review of Glycemic Control  Diabetes history: type 2 Outpatient Diabetes medications: 70/30 insulin 12 units BID Current orders for Inpatient glycemic control: Novolog 0-9 units correction scale TID, 0-5 units scale at [facility]  Inpatient Diabetes Program Recommendations:   Noted that blood sugars have been greater than 180 mg/dl. Recommend increasing Semglee to 12 units daily if blood sugars continue to be elevated. Titrate dosages as needed.   Harvel Ricks RN BSN CDE Diabetes Coordinator Pager: [phone]  8am-5pm

## 2022-03-22 NOTE — Progress Notes (Signed)
Discharge instructions provided to patient. All medications, follow up appointments, and discharge instructions provided. IV out. Monitor off CCMD notified. Discharging home with family.

## 2022-03-22 NOTE — Progress Notes (Addendum)
STROKE TEAM PROGRESS NOTE   INTERVAL HISTORY Her husband and children are at the bedside.  Loop recorder placed in 2016 after her TEE and both were unremarkable. She states she was having trouble with breathing Tuesday afternoon, woke up fine but around 1pm went to lay down and felt like she couldn't breath. Son found her having incoherent speech. She also noted right side tingling, numbness, couple of hours. She was initially taken to [facility] and then transferred to [facility] for a stroke workup During her last hospitalization upon  telemetry monitoring on 03/26/2021 patient was found to be in paroxysmal A-fib and started on Cardizem.  Patient refused anticoagulation at discharge as she could not afford Eliquis.  Okay thanks  Vitals:   03/21/22 1957 03/21/22 2036 03/21/22 2337 03/22/22 0333  BP: 128/81  107/71 (!) 111/90  Pulse: (!) 108  (!) 109 76  Resp: '18  16 16  '$ Temp: 98.6 F (37 C)  98.2 F (36.8 C) 98.3 F (36.8 C)  TempSrc: Oral  Oral Oral  SpO2: 96% 96% 99% 94%  Weight:      Height:       CBC:  Recent Labs  Lab 03/20/22 1507 03/20/22 1515 03/21/22 0257 03/22/22 0320  WBC 18.1*   < > 13.6* 11.0*  NEUTROABS 14.3*  --   --  6.0  HGB 15.2*   < > 13.7 13.9  HCT 44.8   < > 40.3 42.2  MCV 94.3   < > 94.4 95.0  PLT 298   < > 247 184   < > = values in this interval not displayed.   Basic Metabolic Panel:  Recent Labs  Lab 03/21/22 0257 03/22/22 0320  NA 137 134*  K 3.4* 4.3  CL 102 104  CO2 22 17*  GLUCOSE 130* 237*  BUN 8 10  CREATININE 0.65 0.80  CALCIUM 9.0 9.1  MG 1.8 1.9  PHOS 3.7 3.9   Lipid Panel:  Recent Labs  Lab 03/21/22 0257  CHOL 170  TRIG 118  HDL 33*  CHOLHDL 5.2  VLDL 24  LDLCALC 113*   HgbA1c:  Recent Labs  Lab 03/20/22 1830  HGBA1C 11.4*   Urine Drug Screen: No results for input(s): "LABOPIA", "COCAINSCRNUR", "LABBENZ", "AMPHETMU", "THCU", "LABBARB" in the last 168 hours.  Alcohol Level No results for input(s): "ETH" in the  last 168 hours.  IMAGING past 24 hours ECHOCARDIOGRAM COMPLETE BUBBLE STUDY  Result Date: 03/21/2022    ECHOCARDIOGRAM REPORT   Patient Name:   [name] Date of Exam: 03/21/2022 Medical Rec #:  [id number]         Height:       66.0 in Accession #:    [id number]        Weight:       119.0 lb Date of Birth:  06-Apr-1965         BSA:          1.604 m Patient Age:    57 years          BP:           121/73 mmHg Patient Gender: F                 HR:           115 bpm. Exam Location:  Inpatient Procedure: 2D Echo and Saline Contrast Bubble Study Indications:    stroke  History:        Patient has prior history  of Echocardiogram examinations, most                 recent 03/24/2021. Stroke; Risk Factors:Diabetes and                 Hypertension.  Sonographer:    Harvie Junior Referring Phys: Kayleen Memos  Sonographer Comments: Technically difficult study due to poor echo windows. IMPRESSIONS  1. Left ventricular ejection fraction, by estimation, is 70 to 75%. The left ventricle has hyperdynamic function. The left ventricle has no regional wall motion abnormalities. Left ventricular diastolic parameters are indeterminate.  2. Right ventricular systolic function is normal. The right ventricular size is normal. There is normal pulmonary artery systolic pressure. The estimated right ventricular systolic pressure is [id number] mmHg.  3. Post-inflammatory apppearance of the mitral valve. Challenging to interpret gradients in the setting of tachycardia. The posterior leaflet does appear fixed, suggesting there is likely at least mild mitral stenosis at a normal heart rate. The mitral valve is abnormal. No evidence of mitral valve regurgitation. No evidence of mitral stenosis.  4. The aortic valve is grossly normal. Aortic valve regurgitation is not visualized. No aortic stenosis is present.  5. The inferior vena cava is normal in size with greater than 50% respiratory variability, suggesting right atrial pressure of 3 mmHg.  6.  Cannot exclude a small PFO by color flow Doppler. Agitated saline contrast bubble study was negative, with no evidence of any right to left interatrial shunt. FINDINGS  Left Ventricle: Left ventricular ejection fraction, by estimation, is 70 to 75%. The left ventricle has hyperdynamic function. The left ventricle has no regional wall motion abnormalities. The left ventricular internal cavity size was normal in size. There is no left ventricular hypertrophy. Left ventricular diastolic parameters are indeterminate. Right Ventricle: The right ventricular size is normal. No increase in right ventricular wall thickness. Right ventricular systolic function is normal. There is normal pulmonary artery systolic pressure. The tricuspid regurgitant velocity is 2.38 m/s, and  with an assumed right atrial pressure of 3 mmHg, the estimated right ventricular systolic pressure is [id number] mmHg. Left Atrium: Left atrial size was normal in size. Right Atrium: Right atrial size was normal in size. Pericardium: There is no evidence of pericardial effusion. Mitral Valve: Post-inflammatory apppearance of the mitral valve. Challenging to interpret gradients in the setting of tachycardia. The posterior leaflet does appear fixed, suggesting there is likely at least mild mitral stenosis at a normal heart rate. The mitral valve is abnormal. No evidence of mitral valve regurgitation. No evidence of mitral valve stenosis. The mean mitral valve gradient is 7.4 mmHg with average heart rate of 115 bpm. Tricuspid Valve: The tricuspid valve is normal in structure. Tricuspid valve regurgitation is mild . No evidence of tricuspid stenosis. Aortic Valve: The aortic valve is grossly normal. Aortic valve regurgitation is not visualized. No aortic stenosis is present. Aortic valve mean gradient measures 5.5 mmHg. Aortic valve peak gradient measures 10.2 mmHg. Aortic valve area, by VTI measures  2.24 cm. Pulmonic Valve: The pulmonic valve was normal in  structure. Pulmonic valve regurgitation is not visualized. No evidence of pulmonic stenosis. Aorta: The aortic root is normal in size and structure. Venous: The inferior vena cava is normal in size with greater than 50% respiratory variability, suggesting right atrial pressure of 3 mmHg. IAS/Shunts: Cannot exclude a small PFO. Agitated saline contrast was given intravenously to evaluate for intracardiac shunting. Agitated saline contrast bubble study was negative, with no evidence of any interatrial  shunt.  LEFT VENTRICLE PLAX 2D LVIDd:         3.65 cm     Diastology LVIDs:         2.15 cm     LV e' medial:    5.06 cm/s LV PW:         0.85 cm     LV E/e' medial:  26.8 LV IVS:        0.85 cm     LV e' lateral:   7.61 cm/s LVOT diam:     1.80 cm     LV E/e' lateral: 17.8 LV SV:         54 LV SV Index:   34 LVOT Area:     2.54 cm                             3D Volume EF: LV Volumes (MOD)           LV EDV:       48 ml LV vol d, MOD A2C: 34.0 ml LV ESV:       22 ml LV vol d, MOD A4C: 77.1 ml LV SV:        26 ml LV vol s, MOD A2C: 11.8 ml LV vol s, MOD A4C: 35.7 ml LV SV MOD A2C:     22.2 ml LV SV MOD A4C:     77.1 ml LV SV MOD BP:      31.1 ml RIGHT VENTRICLE RV Basal diam:  3.00 cm RV Mid diam:    2.00 cm RV S prime:     17.55 cm/s TAPSE (M-mode): 2.0 cm LEFT ATRIUM           Index        RIGHT ATRIUM          Index LA diam:      3.00 cm 1.87 cm/m   RA Area:     7.02 cm LA Vol (A4C): 32.9 ml 20.51 ml/m  RA Volume:   12.40 ml 7.73 ml/m  AORTIC VALVE                     PULMONIC VALVE AV Area (Vmax):    2.34 cm      PV Vmax:       1.39 m/s AV Area (Vmean):   2.31 cm      PV Peak grad:  7.7 mmHg AV Area (VTI):     2.24 cm AV Vmax:           160.00 cm/s AV Vmean:          107.700 cm/s AV VTI:            0.243 m AV Peak Grad:      10.2 mmHg AV Mean Grad:      5.5 mmHg LVOT Vmax:         147.00 cm/s LVOT Vmean:        97.800 cm/s LVOT VTI:          0.214 m LVOT/AV VTI ratio: 0.88  AORTA Ao Root diam: 3.10 cm Ao Asc  diam:  3.30 cm MITRAL VALVE                TRICUSPID VALVE MV Area (PHT): 6.17 cm     TR Peak grad:   22.7 mmHg MV Mean grad:  7.4 mmHg     TR Vmax:  238.00 cm/s MV Decel Time: 123 msec MR Peak grad: 51.0 mmHg     SHUNTS MR Vmax:      357.00 cm/s   Systemic VTI:  0.21 m MV E velocity: 135.50 cm/s  Systemic Diam: 1.80 cm MV A velocity: 226.00 cm/s MV E/A ratio:  0.60 Cherlynn Kaiser MD Electronically signed by Cherlynn Kaiser MD Signature Date/Time: 03/21/2022/10:13:57 PM    Final    CT ANGIO HEAD NECK W WO CM  Result Date: 03/21/2022 CLINICAL DATA:  Transient ischemic attack. EXAM: CT HEAD WITHOUT CONTRAST CT ANGIOGRAPHY OF THE HEAD AND NECK TECHNIQUE: Contiguous axial images were obtained from the base of the skull through the vertex without intravenous contrast. Multidetector CT imaging of the head and neck was performed using the standard protocol during bolus administration of intravenous contrast. Multiplanar CT image reconstructions and MIPs were obtained to evaluate the vascular anatomy. Carotid stenosis measurements (when applicable) are obtained utilizing NASCET criteria, using the distal internal carotid diameter as the denominator. RADIATION DOSE REDUCTION: This exam was performed according to the departmental dose-optimization program which includes automated exposure control, adjustment of the mA and/or kV according to patient size and/or use of iterative reconstruction technique. CONTRAST:  59m OMNIPAQUE IOHEXOL 350 MG/ML SOLN COMPARISON:  MRI brain 03/21/2022.  Head CT 03/20/2022. FINDINGS: CT HEAD Brain: Subtle asymmetric subcortical hypoattenuation along the inferior aspect of the right postcentral gyrus, corresponding to subacute infarct seen on same day brain MRI. No acute intracranial hemorrhage, mass effect or midline shift. Unchanged old infarcts in the bilateral occipital lobes, left posterior insula and left cerebellar hemisphere. Vascular: No hyperdense vessel or unexpected  calcification. Skull: No calvarial fracture or suspicious bone lesion. Skull base is unremarkable. Sinuses/Orbits: Unchanged mild paranasal sinus disease. CTA NECK Aortic arch: Three-vessel arch configuration. Atherosclerotic calcifications of the aortic arch and arch vessel origins. Arch vessel origins are patent. Right carotid system: The common and internal carotid arteries are patent to the skull base without stenosis, aneurysm or dissection. Left carotid system: Mild mixed plaque along the left carotid bulb and proximal left cervical ICA without stenosis. Vertebral arteries:Patent from the origin to the confluence with the basilar without stenosis or dissection. Skeleton: Mild degenerative changes of the lower cervical spine without high-grade spinal canal stenosis. No suspicious bone lesions. Other neck: Moderate emphysema in the lung apices. CTA HEAD Anterior circulation: Intracranial ICAs are patent without stenosis or aneurysm. The proximal ACAs and MCAs are patent without stenosis or aneurysm. Distal branches are symmetric. Posterior circulation: Normal basilar artery. The SCAs, AICAs and PICAs are patent proximally. The PCAs are patent proximally without stenosis or aneurysm. Distal branches are symmetric. Venous sinuses: As permitted by early phase of contrast, patent. Anatomic variants: None. IMPRESSION: 1. Subtle asymmetric subcortical hypoattenuation along the inferior aspect of the right postcentral gyrus, corresponding to subacute infarct seen on same day brain MRI. No acute intracranial hemorrhage or mass effect. 2. No large vessel occlusion or significant stenosis in the head or neck vessels. 3. Mild mixed plaque along the left carotid bulb and proximal left cervical ICA. 4. Aortic Atherosclerosis (ICD10-I70.0). Electronically Signed   By: WEmmit AlexandersM.D.   On: 03/21/2022 19:42    PHYSICAL EXAM Constitutional: Appears well-developed and well-nourished.   Cardiovascular: Normal rate and  regular rhythm.  Respiratory: Effort normal, non-labored breathing  Neuro: Mental Status: Patient is awake, alert, oriented to person, place, month, year, and situation. Patient is able to give a clear and coherent history. No signs of aphasia  or neglect Cranial Nerves: II: Visual Fields are full. Pupils are equal, round, and reactive to light.   III,IV, VI: EOMI without ptosis or diploplia.  V: Facial sensation is symmetric to temperature VII: Facial movement is symmetric resting and smiling VIII: Hearing is intact to voice X: Palate elevates symmetrically XI: Shoulder shrug is symmetric. XII: Tongue protrudes midline without atrophy or fasciculations.  Motor: Tone is normal. Bulk is normal. 5/5 strength was present in all four extremities.  Diminished fine finger movements on the left.  Bedside to the left upper extremity. Sensory: Sensation is symmetric to light touch and temperature in the arms and legs.  Cerebellar: FNF and HKS are intact bilaterally Initially unsteady with standing but able to self correct and ambulate with a standby assist    NIHSS 0 premorbid modified Rankin score 0   ASSESSMENT/PLAN Ms. [name] is a 57 y.o. female with history of PE, DVT who is supposed to be on Eliquis but is noncompliant, diabetes, hypertension, anxiety, depression, prior stroke in [id number] with embolic appearing infarcts worked up with loop recorder placement that has been negative for A-fib 10/27/2016-brought into the hospital for evaluation of an episode of slurred speech that lasted about an hour.   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 Interval development of encephalomalacia in the medial right occipital lobe, consistent with remote prior infarct. 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 CTA  head & neck Mild mixed plaque along the left carotid bulb and proximal left cervical ICA. MRI  Small subacute infarct in the posterior right frontal lobe.  2D Echo 70-75% with LV hyperdynamic function,bubble study negative LDL 113 HgbA1c 11.4 VTE prophylaxis - Eliquis    Diet   Diet heart healthy/carb modified Room service appropriate? Yes; Fluid consistency: Thin   No antithrombotic prior to admission, now on clopidogrel 75 mg daily and eliquis '5mg'$  BID  Therapy recommendations:  Home health PT Disposition:  None   Hypertension Home meds:  None (prescribed cardizem) Stable Permissive hypertension (OK if < 220/120) but gradually normalize in 5-7 days Long-term BP goal normotensive  Hyperlipidemia Home meds: None (prescribed fenofibrate, pravastatin), resumed in hospital LDL 113, goal < 70 Continue statin at discharge  Diabetes type II Uncontrolled Home meds:  Insulin (not taking) HgbA1c 11.4, goal < 7.0 CBGs Recent Labs    03/21/22 1616 03/21/22 2132 03/22/22 0610  GLUCAP 239* 311* 283*    SSI  Other Stroke Risk Factors Cigarette smoker, advised to stop smoking Hx stroke/TIA Stroke in 123456- appeared embolic - loop recorder negative, TEE negative for PFO PVD  Other Active Problems UTI WBC 19.2 - 11.4  Hospital day # 1  Patient seen and examined by NP/APP with MD. MD to update note as needed.   Janine Ores, DNP, FNP-BC [facility] Pager: [phone]  STROKE MD NOTE :  I have personally obtained history,examined this patient, reviewed notes, independently viewed imaging studies, participated in medical decision making and plan of care.ROS completed by me personally and pertinent positives fully documented  I have made any additions or clarifications directly to the above note. Agree with note above.  Patient presented with transient speech difficulties and right hand tingling.  MRI scan shows a subacute right frontal large subcortical infarct.  She  was diagnosed with paroxysmal A-fib in the past but refused anticoagulation with Eliquis.  She is currently on Plavix.  She has had an extensive workup in the past for cryptogenic stroke which was negative including loop recorder monitoring for 4 years.  Plan to check TCD bubble study for PFO.  Patient is allergic to aspirin and continue Plavix alone.  Consider possible participation in the [place] A-fib trial if interested.  Patient was given written information to review at home and decide and discussed with family members.  It was made clear study participation involuntary and patient will continue to get Care is irrespective of whether she participates in the study or not. D/w Dr Sloan Leiter Greater than 50% time during this 50-minute visit was spent in counseling and coordination of care about her embolic stroke and history of paroxysmal A-fib and discussion about risk-benefit of anticoagulation and answering questions. Antony Contras, MD Medical Director [facility] Pager: [phone] 03/22/2022 3:00 PM   To contact Stroke Continuity provider, please refer to [email or web address] After hours, contact General Neurology

## 2022-03-22 NOTE — Progress Notes (Signed)
VASCULAR LAB    Patient was transported to the vascular lab for TCD bubble study.  However, Dr. Leonie Man was called to a code stroke and notified us that the bubble study would have to be postponed until 03/23/22. Patient stating she is going to go home regardless of whether she has the test or not because she was told she could discharge today.     Tytianna Greenley, RVT 03/22/2022, 3:56 PM

## 2022-03-23 ENCOUNTER — Telehealth (HOSPITAL_COMMUNITY): Payer: Self-pay

## 2022-03-23 ENCOUNTER — Other Ambulatory Visit (HOSPITAL_COMMUNITY): Payer: Self-pay

## 2022-03-23 NOTE — Telephone Encounter (Signed)
Pharmacy Patient Advocate Encounter  Insurance verification completed.    The patient is insured through [employer] and [facility]   The patient is currently admitted and ran test claims for the following: Fenofibrate 160 mg.  Copays and coinsurance results were relayed to Inpatient clinical team (6.22)

## 2022-03-25 LAB — CULTURE, BLOOD (ROUTINE X 2)
Culture: NO GROWTH
Culture: NO GROWTH
Special Requests: ADEQUATE
Special Requests: ADEQUATE

## 2022-03-27 ENCOUNTER — Telehealth: Payer: Self-pay | Admitting: *Deleted

## 2022-03-27 NOTE — Transitions of Care (Post Inpatient/ED Visit) (Signed)
   03/27/2022  Name: [name] MRN: [id number] DOB: 14-Dec-1965  Today's TOC FU Call Status: Today's TOC FU Call Status:: Unsuccessul Call (1st Attempt) Unsuccessful Call (1st Attempt) Date: 03/27/22  Attempted to reach the patient regarding the most recent Inpatient/ED visit.  Follow Up Plan: Additional outreach attempts will be made to reach the patient to complete the Transitions of Care (Post Inpatient/ED visit) call.   Lurena Joiner RN, BSN [place]  [employer] manager

## 2022-03-28 ENCOUNTER — Telehealth: Payer: Self-pay | Admitting: *Deleted

## 2022-03-28 NOTE — Transitions of Care (Post Inpatient/ED Visit) (Signed)
   03/28/2022  Name: [name] MRN: [id number] DOB: [birth year]  Today's TOC FU Call Status: Today's TOC FU Call Status:: Unsuccessful Call (2nd Attempt) Unsuccessful Call (2nd Attempt) Date: 03/28/22  Attempted to reach the patient regarding the most recent Inpatient/ED visit.  Follow Up Plan: Additional outreach attempts will be made to reach the patient to complete the Transitions of Care (Post Inpatient/ED visit) call.   Lurena Joiner RN, BSN [place]  [employer] manager

## 2022-03-29 ENCOUNTER — Telehealth: Payer: Self-pay | Admitting: *Deleted

## 2022-03-29 NOTE — Transitions of Care (Post Inpatient/ED Visit) (Signed)
   03/29/2022  Name: VERNIA FREDA MRN: [id number] DOB: 1965/12/12  Today's TOC FU Call Status: Today's TOC FU Call Status:: Unsuccessful Call (3rd Attempt) Unsuccessful Call (3rd Attempt) Date: 03/29/22  Attempted to reach the patient regarding the most recent Inpatient/ED visit.  Follow Up Plan: No further outreach attempts will be made at this time. We have been unable to contact the patient.  Lurena Joiner RN, BSN [facility] RN Care Coordinator [phone]

## 2022-05-21 ENCOUNTER — Other Ambulatory Visit (HOSPITAL_COMMUNITY): Payer: Self-pay

## 2022-10-12 ENCOUNTER — Other Ambulatory Visit: Payer: Self-pay | Admitting: Nurse Practitioner

## 2022-10-12 DIAGNOSIS — Z1211 Encounter for screening for malignant neoplasm of colon: Secondary | ICD-10-CM

## 2022-10-12 DIAGNOSIS — Z1212 Encounter for screening for malignant neoplasm of rectum: Secondary | ICD-10-CM

## 2022-10-17 ENCOUNTER — Other Ambulatory Visit: Payer: Self-pay

## 2022-10-17 ENCOUNTER — Emergency Department (HOSPITAL_BASED_OUTPATIENT_CLINIC_OR_DEPARTMENT_OTHER): Payer: 59

## 2022-10-17 ENCOUNTER — Encounter (HOSPITAL_BASED_OUTPATIENT_CLINIC_OR_DEPARTMENT_OTHER): Payer: Self-pay | Admitting: Pediatrics

## 2022-10-17 ENCOUNTER — Emergency Department (HOSPITAL_BASED_OUTPATIENT_CLINIC_OR_DEPARTMENT_OTHER)
Admission: EM | Admit: 2022-10-17 | Discharge: 2022-10-17 | Disposition: A | Discharge status: Home / Self Care | Payer: 59 | Attending: Emergency Medicine | Admitting: Emergency Medicine

## 2022-10-17 DIAGNOSIS — R197 Diarrhea, unspecified: Secondary | ICD-10-CM | POA: Insufficient documentation

## 2022-10-17 DIAGNOSIS — R0789 Other chest pain: Secondary | ICD-10-CM | POA: Insufficient documentation

## 2022-10-17 DIAGNOSIS — D72829 Elevated white blood cell count, unspecified: Secondary | ICD-10-CM | POA: Diagnosis not present

## 2022-10-17 DIAGNOSIS — I10 Essential (primary) hypertension: Secondary | ICD-10-CM | POA: Diagnosis not present

## 2022-10-17 DIAGNOSIS — Z20822 Contact with and (suspected) exposure to covid-19: Secondary | ICD-10-CM | POA: Insufficient documentation

## 2022-10-17 DIAGNOSIS — N1330 Unspecified hydronephrosis: Secondary | ICD-10-CM | POA: Diagnosis not present

## 2022-10-17 DIAGNOSIS — J439 Emphysema, unspecified: Secondary | ICD-10-CM | POA: Diagnosis not present

## 2022-10-17 DIAGNOSIS — R1084 Generalized abdominal pain: Secondary | ICD-10-CM | POA: Diagnosis not present

## 2022-10-17 DIAGNOSIS — E119 Type 2 diabetes mellitus without complications: Secondary | ICD-10-CM | POA: Diagnosis not present

## 2022-10-17 DIAGNOSIS — R609 Edema, unspecified: Secondary | ICD-10-CM | POA: Diagnosis not present

## 2022-10-17 DIAGNOSIS — R079 Chest pain, unspecified: Secondary | ICD-10-CM

## 2022-10-17 LAB — URINALYSIS, ROUTINE W REFLEX MICROSCOPIC
Bilirubin Urine: NEGATIVE
Glucose, UA: 100 mg/dL — AB
Hgb urine dipstick: NEGATIVE
Ketones, ur: NEGATIVE mg/dL
Leukocytes,Ua: NEGATIVE
Nitrite: NEGATIVE
Protein, ur: NEGATIVE mg/dL
Specific Gravity, Urine: 1.01 (ref 1.005–1.030)
pH: 5.5 (ref 5.0–8.0)

## 2022-10-17 LAB — HEPATIC FUNCTION PANEL
ALT: 14 U/L (ref 0–44)
AST: 18 U/L (ref 15–41)
Albumin: 4.3 g/dL (ref 3.5–5.0)
Alkaline Phosphatase: 84 U/L (ref 38–126)
Bilirubin, Direct: 0.1 mg/dL (ref 0.0–0.2)
Indirect Bilirubin: 0.2 mg/dL — ABNORMAL LOW (ref 0.3–0.9)
Total Bilirubin: 0.3 mg/dL (ref 0.3–1.2)
Total Protein: 7.4 g/dL (ref 6.5–8.1)

## 2022-10-17 LAB — BASIC METABOLIC PANEL
Anion gap: 13 (ref 5–15)
BUN: 15 mg/dL (ref 6–20)
CO2: 19 mmol/L — ABNORMAL LOW (ref 22–32)
Calcium: 9.2 mg/dL (ref 8.9–10.3)
Chloride: 102 mmol/L (ref 98–111)
Creatinine, Ser: 0.89 mg/dL (ref 0.44–1.00)
GFR, Estimated: >60 mL/min (ref >60)
Glucose, Bld: 205 mg/dL — ABNORMAL HIGH (ref 70–99)
Potassium: 4.4 mmol/L (ref 3.5–5.1)
Sodium: 134 mmol/L — ABNORMAL LOW (ref 135–145)

## 2022-10-17 LAB — RESP PANEL BY RT-PCR (RSV, FLU A&B, COVID)  RVPGX2
Influenza A by PCR: NEGATIVE
Influenza B by PCR: NEGATIVE
Resp Syncytial Virus by PCR: NEGATIVE
SARS Coronavirus 2 by RT PCR: NEGATIVE

## 2022-10-17 LAB — CBC
HCT: 48.3 % — ABNORMAL HIGH (ref 36.0–46.0)
Hemoglobin: 16.2 g/dL — ABNORMAL HIGH (ref 12.0–15.0)
MCH: 31.3 pg (ref 26.0–34.0)
MCHC: 33.5 g/dL (ref 30.0–36.0)
MCV: 93.4 fL (ref 80.0–100.0)
Platelets: 242 10*3/uL (ref 150–400)
RBC: 5.17 MIL/uL — ABNORMAL HIGH (ref 3.87–5.11)
RDW: 13.2 % (ref 11.5–15.5)
WBC: 11.6 10*3/uL — ABNORMAL HIGH (ref 4.0–10.5)
nRBC: 0 % (ref 0.0–0.2)

## 2022-10-17 LAB — TROPONIN I (HIGH SENSITIVITY)
Troponin I (High Sensitivity): 3 ng/L (ref <18)
Troponin I (High Sensitivity): 4 ng/L (ref <18)

## 2022-10-17 MED ORDER — IOHEXOL 350 MG/ML SOLN
100.0000 mL | Freq: Once | INTRAVENOUS | Status: AC | PRN
  Administered 2022-10-17: 100 mL via INTRAVENOUS

## 2022-10-17 MED ORDER — ACETAMINOPHEN 500 MG PO TABS
1000.0000 mg | ORAL_TABLET | Freq: Once | ORAL | Status: AC
  Administered 2022-10-17: 1000 mg via ORAL
  Filled 2022-10-17: qty 2

## 2022-10-17 MED ORDER — KETOROLAC TROMETHAMINE 15 MG/ML IJ SOLN
15.0000 mg | Freq: Once | INTRAMUSCULAR | Status: AC
  Administered 2022-10-17: 15 mg via INTRAVENOUS
  Filled 2022-10-17: qty 1

## 2022-10-17 MED ORDER — OXYCODONE HCL 5 MG PO TABS
5.0000 mg | ORAL_TABLET | Freq: Once | ORAL | Status: AC
  Administered 2022-10-17: 5 mg via ORAL
  Filled 2022-10-17: qty 1

## 2022-10-17 MED ORDER — SODIUM CHLORIDE 0.9 % IV BOLUS
500.0000 mL | Freq: Once | INTRAVENOUS | Status: AC
  Administered 2022-10-17: 500 mL via INTRAVENOUS

## 2022-10-17 NOTE — Discharge Instructions (Signed)
Your CT scan the radiologist thought that your adrenal mass has gotten bigger.  Please let your doctor know about this.  They may want to send you to a surgeon if they have not already.  You also have the early stages of a pressure sore on your bottom.  Please try to keep your weight off of this as best she can.

## 2022-10-17 NOTE — ED Provider Notes (Signed)
Received patient in turnover from Dr. Jacqulyn Bath.  Please see their note for further details of Hx, PE.  Briefly patient is a 57 y.o. female with a Numbness and Chest Pain .  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  Plan for CT imaging of the chest abdomen and pelvis.  Patient's blood work is resulted, no change to renal function trivial leukocytosis.  No significant electrolyte abnormalities.  LFTs unremarkable.  Awaiting CT imaging.  CT scan is resulted without pulmonary embolism.  No occult pneumonia.  Patient has an adrenal mass that she knows about that is gotten a bit bigger.  I encouraged her to follow-up with whichever provider is following up for this.  The other incidental CT read was that she had some signs of skin hypertrophy about the gluteal region.  She tells me she has had an issue there.  On exam it seems more like a pressure sore with anything else.  Tells me she has decreased mobilization is spent a lot of time in a seated position.  I discussed trying to increase her mobilization at home.  Have her follow-up for with her family doctor.    Melene Plan, DO 10/17/22 2229

## 2022-10-17 NOTE — ED Triage Notes (Signed)
C/O bilateral leg numbness 30-40 mins ago, endorsed CP started last night

## 2022-10-17 NOTE — ED Provider Notes (Signed)
Emergency Department Provider Note   I have reviewed the triage vital signs and the nursing notes.   HISTORY  Chief Complaint Numbness and Chest Pain   HPI [name] is a 57 y.o. female past history reviewed below including prior pneumonia, recurrent PE, diabetes, stroke presents to the emergency department with multiple symptoms.  She states over the past 2 days she has had cramping abdominal pain with diarrhea.  She has been unable to keep down any of her home medications.  She notes around that same time that she developed tingling/burning discomfort to the bottoms of both feet with some radiation up the back of both legs.  Denies lower back pain or particular weakness.  She has been managing at home but today developed some central chest pressure "like someone is standing on my chest" and so decided to present to the ED for evaluation. Denies SOB.  She tells me the symptoms feel similar to when she had pneumonia earlier this year.  She does smoke heavily but does not carry a COPD or emphysema diagnosis. She does feel SOB but not worse than her usual.   Past Medical History:  Diagnosis Date   Acid reflux    Adrenal benign tumor    Apparently enlarging and patient "is supposed to have it taken out"   Anxiety    Depression    Diabetes mellitus without complication (HCC)    DVT (deep vein thrombosis) in pregnancy    Hypertension    Nausea vomiting and diarrhea 03/24/2021   PE (pulmonary embolism)    Stroke [facility])     Review of Systems  Constitutional: No fever/chills. Positive fatigue.  Cardiovascular: Positive chest pain. Respiratory: Baseline shortness of breath. Gastrointestinal: Positive abdominal pain. Positive nausea, no vomiting. Positive diarrhea.  Genitourinary: Negative for dysuria. Musculoskeletal: Negative for back pain. Skin: Negative for rash. Neurological: Positive HA and tingling to the bottom of both feet.    ____________________________________________   PHYSICAL EXAM:  VITAL SIGNS: ED Triage Vitals  Encounter Vitals Group     BP 10/17/22 1357 (!) 141/85     Pulse Rate 10/17/22 1357 (!) 119     Resp 10/17/22 1357 20     Temp 10/17/22 1357 98.1 F (36.7 C)     Temp src --      SpO2 10/17/22 1357 95 %     Weight 10/17/22 1402 133 lb 8 oz (60.6 kg)     Height 10/17/22 1402 5\' 6"  (1.676 m)   Constitutional: Alert and oriented. Well appearing and in no acute distress. Eyes: Conjunctivae are normal. Head: Atraumatic. Nose: No congestion/rhinnorhea. Mouth/Throat: Mucous membranes are moist. Neck: No stridor.   Cardiovascular: Normal rate, regular rhythm. Good peripheral circulation. Grossly normal heart sounds. 2+ DP and PT pulses bilaterally.  Respiratory: Normal respiratory effort.  No retractions. Lungs CTAB. Gastrointestinal: Soft with mild diffuse tenderness. No peritonitis. No distention.  Musculoskeletal: No lower extremity tenderness nor edema. No gross deformities of extremities. Neurologic:  Normal speech and language. Normal strength and sensation to the bilateral LEs.  Skin:  Skin is warm, dry and intact. No rash noted.  ____________________________________________   LABS (all labs ordered are listed, but only abnormal results are displayed)  Labs Reviewed  BASIC METABOLIC PANEL - Abnormal; Notable for the following components:      Result Value   Sodium 134 (*)    CO2 19 (*)    Glucose, Bld 205 (*)    All other components within normal  limits  CBC - Abnormal; Notable for the following components:   WBC 11.6 (*)    RBC 5.17 (*)    Hemoglobin 16.2 (*)    HCT 48.3 (*)    All other components within normal limits  HEPATIC FUNCTION PANEL - Abnormal; Notable for the following components:   Indirect Bilirubin 0.2 (*)    All other components within normal limits  URINALYSIS, ROUTINE W REFLEX MICROSCOPIC - Abnormal; Notable for the following components:   Glucose, UA  100 (*)    All other components within normal limits  RESP PANEL BY RT-PCR (RSV, FLU A&B, COVID)  RVPGX2  TROPONIN I (HIGH SENSITIVITY)  TROPONIN I (HIGH SENSITIVITY)   ____________________________________________  EKG   EKG Interpretation Date/Time:  Wednesday October 17 2022 14:00:16 EDT Ventricular Rate:  117 PR Interval:  121 QRS Duration:  120 QT Interval:  342 QTC Calculation: 478 R Axis:   -83  Text Interpretation: Sinus tachycardia Atrial premature complex Right atrial enlargement Right bundle branch block Anterolateral infarct, age indeterminate Similar to March 2023 tracing although RBBB now developing Confirmed by Alona Bene [phone]) on 10/17/2022 2:05:30 PM       ____________________________________________   PROCEDURES  Procedure(s) performed:   Procedures  None  ____________________________________________   INITIAL IMPRESSION / ASSESSMENT AND PLAN / ED COURSE  Pertinent labs & imaging results that were available during my care of the patient were reviewed by me and considered in my medical decision making (see chart for details).   This patient is Presenting for Evaluation of CP, which does require a range of treatment options, and is a complaint that involves a high risk of morbidity and mortality.  The Differential Diagnoses includes but is not exclusive to acute coronary syndrome, aortic dissection, pulmonary embolism, cardiac tamponade, community-acquired pneumonia, pericarditis, musculoskeletal chest wall pain, etc.   Critical Interventions-    Medications  sodium chloride 0.9 % bolus 500 mL (0 mLs Intravenous Stopped 10/17/22 1722)  acetaminophen (TYLENOL) tablet 1,000 mg (1,000 mg Oral Given 10/17/22 1746)  oxyCODONE (Oxy IR/ROXICODONE) immediate release tablet 5 mg (5 mg Oral Given 10/17/22 1746)  ketorolac (TORADOL) 15 MG/ML injection 15 mg (15 mg Intravenous Given 10/17/22 1746)  iohexol (OMNIPAQUE) 350 MG/ML injection 100 mL (100 mLs  Intravenous Contrast Given 10/17/22 1905)    Reassessment after intervention: symptoms slightly improved.    I did obtain Additional Historical Information from husband at bedside.   I decided to review pertinent External Data, and in summary patient's last admit was March 2024 with PNA.   Clinical Laboratory Tests Ordered, included CBC without anemia. Mild leukocytosis. No AKI. LFTs normal.   Radiologic Tests Ordered, included CXR. I independently interpreted the images and agree with radiology interpretation.   Cardiac Monitor Tracing which shows sinus tachycardia.    Social Determinants of Health Risk patient is a smoker.   Medical Decision Making: Summary:  Patient arrives to the emergency department with chest pain along with abdominal discomfort and diarrhea.  Her numbness in the feet seems most consistent with neuropathy.  She does have a history of diabetes.  I do not appreciate any unilateral neurodeficit to strongly suspect stroke.  No back pain or other deficit to suspect acute spine emergency or critical limb ischemia.  She arrives tachycardic but not hypoxemic.  Afebrile here.  Plan to start with chest x-ray along with small IV fluid bolus, labs including troponin and viral panel.   Reevaluation with update and discussion with patient. Care transferred to  Dr. Silverio Lay pending CT imaging.   Considered admission but workup is pending.   Patient's presentation is most consistent with acute presentation with potential threat to life or bodily function.   Disposition: pending  ____________________________________________  FINAL CLINICAL IMPRESSION(S) / ED DIAGNOSES  Final diagnoses:  Nonspecific chest pain     Note:  This document was prepared using Dragon voice recognition software and may include unintentional dictation errors.  Alona Bene, MD, [facility] Emergency Medicine    Marlee Trentman, Arlyss Repress, MD 10/19/22 [phone]

## 2023-10-08 ENCOUNTER — Other Ambulatory Visit: Payer: Self-pay

## 2023-10-16 DIAGNOSIS — M79673 Pain in unspecified foot: Secondary | ICD-10-CM | POA: Diagnosis not present

## 2023-10-16 DIAGNOSIS — R609 Edema, unspecified: Secondary | ICD-10-CM | POA: Diagnosis not present

## 2023-10-16 DIAGNOSIS — R739 Hyperglycemia, unspecified: Secondary | ICD-10-CM | POA: Diagnosis not present

## 2023-10-17 ENCOUNTER — Emergency Department (HOSPITAL_COMMUNITY)

## 2023-10-17 ENCOUNTER — Emergency Department (EMERGENCY_DEPARTMENT_HOSPITAL)

## 2023-10-17 ENCOUNTER — Other Ambulatory Visit: Payer: Self-pay

## 2023-10-17 ENCOUNTER — Emergency Department (HOSPITAL_COMMUNITY)
Admission: EM | Admit: 2023-10-17 | Discharge: 2023-10-17 | Disposition: A | Discharge status: Home / Self Care | Attending: Emergency Medicine | Admitting: Emergency Medicine

## 2023-10-17 ENCOUNTER — Encounter (HOSPITAL_COMMUNITY): Payer: Self-pay | Admitting: Emergency Medicine

## 2023-10-17 DIAGNOSIS — R531 Weakness: Secondary | ICD-10-CM | POA: Diagnosis not present

## 2023-10-17 DIAGNOSIS — R600 Localized edema: Secondary | ICD-10-CM | POA: Insufficient documentation

## 2023-10-17 DIAGNOSIS — R791 Abnormal coagulation profile: Secondary | ICD-10-CM | POA: Insufficient documentation

## 2023-10-17 DIAGNOSIS — M7989 Other specified soft tissue disorders: Secondary | ICD-10-CM

## 2023-10-17 DIAGNOSIS — E119 Type 2 diabetes mellitus without complications: Secondary | ICD-10-CM | POA: Diagnosis not present

## 2023-10-17 DIAGNOSIS — N183 Chronic kidney disease, stage 3 unspecified: Secondary | ICD-10-CM | POA: Diagnosis not present

## 2023-10-17 DIAGNOSIS — R000 Tachycardia, unspecified: Secondary | ICD-10-CM | POA: Diagnosis not present

## 2023-10-17 DIAGNOSIS — R918 Other nonspecific abnormal finding of lung field: Secondary | ICD-10-CM | POA: Diagnosis not present

## 2023-10-17 DIAGNOSIS — I129 Hypertensive chronic kidney disease with stage 1 through stage 4 chronic kidney disease, or unspecified chronic kidney disease: Secondary | ICD-10-CM | POA: Diagnosis not present

## 2023-10-17 DIAGNOSIS — M79672 Pain in left foot: Secondary | ICD-10-CM | POA: Insufficient documentation

## 2023-10-17 DIAGNOSIS — F17200 Nicotine dependence, unspecified, uncomplicated: Secondary | ICD-10-CM | POA: Diagnosis not present

## 2023-10-17 DIAGNOSIS — J439 Emphysema, unspecified: Secondary | ICD-10-CM | POA: Diagnosis not present

## 2023-10-17 DIAGNOSIS — M79671 Pain in right foot: Secondary | ICD-10-CM | POA: Diagnosis not present

## 2023-10-17 DIAGNOSIS — I2510 Atherosclerotic heart disease of native coronary artery without angina pectoris: Secondary | ICD-10-CM | POA: Diagnosis not present

## 2023-10-17 LAB — CBC WITH DIFFERENTIAL/PLATELET
Abs Immature Granulocytes: 0.07 K/uL (ref 0.00–0.07)
Basophils Absolute: 0.1 K/uL (ref 0.0–0.1)
Basophils Relative: 1 %
Eosinophils Absolute: 0.2 K/uL (ref 0.0–0.5)
Eosinophils Relative: 1 %
HCT: 42.4 % (ref 36.0–46.0)
Hemoglobin: 13.8 g/dL (ref 12.0–15.0)
Immature Granulocytes: 1 %
Lymphocytes Relative: 25 %
Lymphs Abs: 3.5 K/uL (ref 0.7–4.0)
MCH: 31.9 pg (ref 26.0–34.0)
MCHC: 32.5 g/dL (ref 30.0–36.0)
MCV: 98.1 fL (ref 80.0–100.0)
Monocytes Absolute: 1.2 K/uL — ABNORMAL HIGH (ref 0.1–1.0)
Monocytes Relative: 9 %
Neutro Abs: 8.9 K/uL — ABNORMAL HIGH (ref 1.7–7.7)
Neutrophils Relative %: 63 %
Platelets: 249 K/uL (ref 150–400)
RBC: 4.32 MIL/uL (ref 3.87–5.11)
RDW: 13.3 % (ref 11.5–15.5)
WBC: 13.9 K/uL — ABNORMAL HIGH (ref 4.0–10.5)
nRBC: 0 % (ref 0.0–0.2)

## 2023-10-17 LAB — BASIC METABOLIC PANEL WITH GFR
Anion gap: 15 (ref 5–15)
BUN: 13 mg/dL (ref 6–20)
CO2: 20 mmol/L — ABNORMAL LOW (ref 22–32)
Calcium: 8.3 mg/dL — ABNORMAL LOW (ref 8.9–10.3)
Chloride: 101 mmol/L (ref 98–111)
Creatinine, Ser: 1 mg/dL (ref 0.44–1.00)
GFR, Estimated: >60 mL/min (ref >60)
Glucose, Bld: 311 mg/dL — ABNORMAL HIGH (ref 70–99)
Potassium: 4.1 mmol/L (ref 3.5–5.1)
Sodium: 136 mmol/L (ref 135–145)

## 2023-10-17 LAB — D-DIMER, QUANTITATIVE: D-Dimer, Quant: 1.16 ug{FEU}/mL — ABNORMAL HIGH (ref 0.00–0.50)

## 2023-10-17 LAB — BRAIN NATRIURETIC PEPTIDE: B Natriuretic Peptide: 162 pg/mL — ABNORMAL HIGH (ref 0.0–100.0)

## 2023-10-17 MED ORDER — SODIUM CHLORIDE 0.9 % IV BOLUS
1000.0000 mL | Freq: Once | INTRAVENOUS | Status: AC
  Administered 2023-10-17: 1000 mL via INTRAVENOUS

## 2023-10-17 MED ORDER — IOHEXOL 350 MG/ML SOLN
75.0000 mL | Freq: Once | INTRAVENOUS | Status: AC | PRN
  Administered 2023-10-17: 75 mL via INTRAVENOUS

## 2023-10-17 NOTE — ED Provider Triage Note (Signed)
 Emergency Medicine Provider Triage Evaluation Note  [name] , a 58 y.o. female  was evaluated in triage.  Pt complains of  Difficulty walking, weakness, bilateral foot/leg pain.  Patient's husband is reportedly admitted to the hospital.  She went home with her daughter-in-law today who noticed her feet were swollen.  Patient states she has swollen feet at baseline but they are worse than usual.  She is not sure if this is due to being on her feet more.  She feels that she is unable to walk at this time.  She states she has been forced to use a wheelchair.  She denies any injury or trauma.  Review of Systems  Positive:  Negative:   Physical Exam  BP 115/76   Pulse (!) 128   Temp 98.7 F (37.1 C) (Oral)   Resp 18   Wt 60.6 kg   LMP  (LMP Unknown) Comment: a few months  SpO2 100%   BMI 21.56 kg/m  Gen:   Awake, no distress   Resp:  Normal effort  MSK:   Moves extremities without difficulty  Other:    Medical Decision Making  Medically screening exam initiated at 12:38 AM.  Appropriate orders placed.  KEMYAH BUSER was informed that the remainder of the evaluation will be completed by another provider, this initial triage assessment does not replace that evaluation, and the importance of remaining in the ED until their evaluation is complete.     Logan Ubaldo [place], [place] 10/17/23 [phone]

## 2023-10-17 NOTE — ED Notes (Signed)
 Patient transported to CT

## 2023-10-17 NOTE — Progress Notes (Signed)
 Bilateral lower extremity venous duplex has been completed.  Results can be found in chart review under CV Proc.  10/17/2023 10:56 AM  Star Cheese Elden Appl, RVT.

## 2023-10-17 NOTE — ED Triage Notes (Signed)
 Pt in from home via [facility] with reported bilat foot pain/edema, cannot walk today; pt's daughter in law states pt's husband is a pt admitted upstairs and she has been visiting him daily, noticed how swollen her feet were tonight when she got home. Given bolus PTA.  VS w/EMS: 120HR > no changes after fluids 138/92 98%RA

## 2023-10-17 NOTE — Discharge Instructions (Signed)
 Your evaluation today has been reassuring, no evidence of blood clots in your lungs or your legs.  You can have swelling in your legs and feet for a variety of different reasons.  I recommend wearing compression stockings to help with swelling and following up closely with your primary care provider.  Your heart rate was noted to be elevated today although this did improve with fluids, recheck with your primary care doctor within the next week.  If you develop new or worsening symptoms such as chest pain, shortness of breath, palpitations worsening edema or other new or concerning symptoms return to the emergency department for reevaluation.

## 2023-10-17 NOTE — ED Provider Notes (Signed)
 [place] EMERGENCY DEPARTMENT AT [facility] Provider Note   CSN: [id number] Arrival date & time: 10/17/23  9981     Patient presents with: Leg Swelling and Tachycardia   ADMIRE BUNNELL is a 58 y.o. female.  Patient with past medical history significant for PE and DVT history, PCA stroke, CKD stage III, type II DM, hypertension who presents to the emergency department via EMS complaining of bilateral foot pain and swelling.  She states she is unable to walk due to pain and swelling.  She has recently been spending most of her day for the past week at the hospital visiting her husband who was admitted in declining health.  She denies shortness of breath, chest pain, nausea, vomiting, abdominal pain, urinary symptoms.  Of note she is tachycardic with a heart rate of 128 upon arrival.   HPI     Prior to Admission medications   Not on File    Allergies: Amoxicillin, Aspirin, Codeine, and Penicillins    Review of Systems  Updated Vital Signs BP 102/68   Pulse (!) 112   Temp 98.6 F (37 C) (Oral)   Resp (!) 29   Wt 60.6 kg   LMP  (LMP Unknown) Comment: a few months  SpO2 98%   BMI 21.56 kg/m   Physical Exam Vitals and nursing note reviewed.  Constitutional:      General: She is not in acute distress.    Appearance: She is well-developed.  HENT:     Head: Normocephalic and atraumatic.  Eyes:     Conjunctiva/sclera: Conjunctivae normal.  Cardiovascular:     Rate and Rhythm: Normal rate and regular rhythm.     Heart sounds: No murmur heard. Pulmonary:     Effort: Pulmonary effort is normal. No respiratory distress.     Breath sounds: Normal breath sounds.  Abdominal:     Palpations: Abdomen is soft.     Tenderness: There is no abdominal tenderness.  Musculoskeletal:        General: No swelling.     Cervical back: Neck supple.     Right lower leg: Edema present.     Left lower leg: Edema present.     Comments: Significant bilateral pedal edema.     Skin:    General: Skin is warm and dry.     Capillary Refill: Capillary refill takes less than 2 seconds.  Neurological:     Mental Status: She is alert.  Psychiatric:        Mood and Affect: Mood normal.     (all labs ordered are listed, but only abnormal results are displayed) Labs Reviewed  CBC WITH DIFFERENTIAL/PLATELET - Abnormal; Notable for the following components:      Result Value   WBC 13.9 (*)    Neutro Abs 8.9 (*)    Monocytes Absolute 1.2 (*)    All other components within normal limits  BASIC METABOLIC PANEL WITH GFR - Abnormal; Notable for the following components:   CO2 20 (*)    Glucose, Bld 311 (*)    Calcium  8.3 (*)    All other components within normal limits  BRAIN NATRIURETIC PEPTIDE - Abnormal; Notable for the following components:   B Natriuretic Peptide 162.0 (*)    All other components within normal limits  D-DIMER, QUANTITATIVE - Abnormal; Notable for the following components:   D-Dimer, Quant 1.16 (*)    All other components within normal limits    EKG: EKG Interpretation Date/Time:  Thursday  October 17 2023 00:31:53 EDT Ventricular Rate:  125 PR Interval:  126 QRS Duration:  100 QT Interval:  334 QTC Calculation: 482 R Axis:   -79  Text Interpretation: Sinus tachycardia Right atrial enlargement Left axis deviation Inferior-posterior infarct , age undetermined Anterolateral infarct , age undetermined Abnormal ECG When compared with ECG of 17-Oct-2022 14:00, PREVIOUS ECG IS PRESENT Confirmed by Trine Likes [phone]) on 10/17/2023 3:31:32 AM  Radiology: [facility] Chest 2 View Result Date: 10/17/2023 CLINICAL DATA:  Weakness EXAM: CHEST - 2 VIEW COMPARISON:  10/17/2022 FINDINGS: The heart size and mediastinal contours are within normal limits. Both lungs are clear. The visualized skeletal structures are unremarkable. Loop recorder is noted. IMPRESSION: No active cardiopulmonary disease. Electronically Signed   By: Oneil Devonshire M.D.   On: 10/17/2023  01:12     Procedures   Medications Ordered in the ED  sodium chloride  0.9 % bolus 1,000 mL (0 mLs Intravenous Stopped 10/17/23 0500)  iohexol  (OMNIPAQUE ) 350 MG/ML injection 75 mL (75 mLs Intravenous Contrast Given 10/17/23 0630)                                    Medical Decision Making Amount and/or Complexity of Data Reviewed Labs: ordered. Radiology: ordered.   This patient presents to the ED for concern of leg pain, this involves an extensive number of treatment options, and is a complaint that carries with it a high risk of complications and morbidity.  The differential diagnosis includes fracture, dislocation, edema, DVT, soft tissue injury, others   Co morbidities / Chronic conditions that complicate the patient evaluation  History of DVT, PE, type II DM   Additional history obtained:  Additional history obtained from EMR External records from outside source obtained and reviewed including previous hospital notes showing patient having decreased mobilization at home   Lab Tests:  I Ordered, and personally interpreted labs.  The pertinent results include: Leukocytosis with a white count of 13,900, BNP 162, glucose 311, D-dimer 1.16   Imaging Studies ordered:  I ordered imaging studies including chest x-ray, CT angio chest PE study, DVT study I independently visualized and interpreted the chest x-ray which showed no acute findings. CT and DVT studies pending I agree with the radiologist interpretation   Cardiac Monitoring: / EKG:  The patient was maintained on a cardiac monitor.  I personally viewed and interpreted the cardiac monitored which showed an underlying rhythm of: Sinus tachycardia   Problem List / ED Course / Critical interventions / Medication management   I ordered medication including normal saline   Reevaluation of the patient after these medicines showed that the patient had decreased tachycardia   Social Determinants of Health:  Patient  is a daily smoker, has Medicaid for primary health insurance type   Test / Admission - Considered:  Patient with persistent tachycardia and elevated D-dimer.  Patient will require CT angio PE study as well as DVT studies.  The studies have been ordered.  Plan to transfer care to Larraine Christen, PA-C at shift handoff.  Disposition pending results of studies and reassessment.      Final diagnoses:  None    ED Discharge Orders     None          Logan Ubaldo KATHEE DEVONNA 10/17/23 9366    Trine Likes Moder, MD 10/18/23 [phone]

## 2023-10-17 NOTE — ED Provider Notes (Signed)
 Care assumed from PA Ubaldo High at shift change, please see their note for full detail, but in brief [name] is a 58 y.o. female presents with bilateral foot pain and swelling.  Unable to walk due to the pain and swelling.  Has been at the hospital frequently visiting her husband who is doing poorly.  Denies associated shortness of breath, chest pain, nausea, vomiting or abdominal pain.  Noted to be persistently tachycardic on arrival..   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  CTPA and DVT ultrasound pending at shift change.  Patient has had some improvement in tachycardia with fluids initially in the 130s now in the 110s.  Does not appear to be on a beta-blocker and is noncompliant with medications in general.   Plan: Follow-up on pending imaging studies, if negative anticipate discharge.  BP 102/68   Pulse (!) 112   Temp 98.6 F (37 C) (Oral)   Resp (!) 29   Wt 60.6 kg   LMP  (LMP Unknown) Comment: a few months  SpO2 98%   BMI 21.56 kg/m    Procedures  Procedures  ED Course / MDM   Labs Reviewed  CBC WITH DIFFERENTIAL/PLATELET - Abnormal; Notable for the following components:      Result Value   WBC 13.9 (*)    Neutro Abs 8.9 (*)    Monocytes Absolute 1.2 (*)    All other components within normal limits  BASIC METABOLIC PANEL WITH GFR - Abnormal; Notable for the following components:   CO2 20 (*)    Glucose, Bld 311 (*)    Calcium  8.3 (*)    All other components within normal limits  BRAIN NATRIURETIC PEPTIDE - Abnormal; Notable for the following components:   B Natriuretic Peptide 162.0 (*)    All other components within normal limits  D-DIMER, QUANTITATIVE - Abnormal; Notable for the following components:   D-Dimer, Quant 1.16 (*)    All other components within normal limits   VAS US  LOWER EXTREMITY VENOUS (DVT) (7a-7p) Result Date: 10/17/2023  Lower Venous DVT Study Patient Name:  SHANICA CASTELLANOS  Date of Exam:    10/17/2023 Medical Rec #: [id number]          Accession #:    [id number] Date of Birth: Jan 01, 1966          Patient Gender: F Patient Age:   61 years Exam Location:  [facility] Procedure:      VAS US  LOWER EXTREMITY VENOUS (DVT) Referring Phys: UBALDO HIGH --------------------------------------------------------------------------------  Indications: Swelling, Edema, Pain, and H/O P.E, DVT and stroke.  Comparison Study: Previous study on 10.20.2022. Performing Technologist: Edilia Elden Appl  Examination Guidelines: A complete evaluation includes B-mode imaging, spectral Doppler, color Doppler, and power Doppler as needed of all accessible portions of each vessel. Bilateral testing is considered an integral part of a complete examination. Limited examinations for reoccurring indications may be performed as noted. The reflux portion of the exam is performed with the patient in reverse Trendelenburg.  +---------+---------------+---------+-----------+----------+--------------+ RIGHT    CompressibilityPhasicitySpontaneityPropertiesThrombus Aging +---------+---------------+---------+-----------+----------+--------------+ CFV      Full           Yes      Yes                                 +---------+---------------+---------+-----------+----------+--------------+ SFJ      Full  Yes      Yes                                 +---------+---------------+---------+-----------+----------+--------------+ FV Prox  Full                                                        +---------+---------------+---------+-----------+----------+--------------+ FV Mid   Full                                                        +---------+---------------+---------+-----------+----------+--------------+ FV DistalFull                                                        +---------+---------------+---------+-----------+----------+--------------+ PFV      Full                                                         +---------+---------------+---------+-----------+----------+--------------+ POP      Full           Yes      Yes                                 +---------+---------------+---------+-----------+----------+--------------+ PTV      Full                                                        +---------+---------------+---------+-----------+----------+--------------+ PERO     Full                                                        +---------+---------------+---------+-----------+----------+--------------+   +---------+---------------+---------+-----------+----------+--------------+ LEFT     CompressibilityPhasicitySpontaneityPropertiesThrombus Aging +---------+---------------+---------+-----------+----------+--------------+ CFV      Full           Yes      Yes                                 +---------+---------------+---------+-----------+----------+--------------+ SFJ      Full           Yes      Yes                                 +---------+---------------+---------+-----------+----------+--------------+ FV Prox  Full                                                        +---------+---------------+---------+-----------+----------+--------------+  FV Mid   Full                                                        +---------+---------------+---------+-----------+----------+--------------+ FV DistalFull                                                        +---------+---------------+---------+-----------+----------+--------------+ PFV      Full                                                        +---------+---------------+---------+-----------+----------+--------------+ POP      Full           Yes      Yes                                 +---------+---------------+---------+-----------+----------+--------------+ PTV      Full                                                         +---------+---------------+---------+-----------+----------+--------------+ PERO     Full                                                        +---------+---------------+---------+-----------+----------+--------------+     Summary: BILATERAL: - No evidence of deep vein thrombosis seen in the lower extremities, bilaterally. -No evidence of popliteal cyst, bilaterally.   *See table(s) above for measurements and observations. Electronically signed by Debby Robertson on 10/17/2023 at 11:52:57 AM.    Final    CT Angio Chest PE W and/or Wo Contrast Result Date: 10/17/2023 EXAM: CTA of the Chest with contrast for PE 10/17/2023 06:28:53 AM TECHNIQUE: CTA of the chest was performed after the administration of 75 mL of iohexol  (OMNIPAQUE ) 350 MG/ML injection. Multiplanar reformatted images are provided for review. MIP images are provided for review. Automated exposure control, iterative reconstruction, and/or weight based adjustment of the mA/kV was utilized to reduce the radiation dose to as low as reasonably achievable. COMPARISON: Chest radiographs 10/17/2023 00:51 hours. Chest CT 10/17/2022. CLINICAL HISTORY: 58 year old female. Pulmonary embolism (PE) suspected, low to intermediate probability, positive D-dimer. Swollen lower extremities. Elevated D-dimer. FINDINGS: PULMONARY ARTERIES: Good pulmonary artery contrast timing. No pulmonary artery filling defect. Main pulmonary artery is normal in caliber. MEDIASTINUM: Heart size remains normal. No pericardial effusion. Calcified coronary artery and soft and calcified aortic atherosclerosis. LYMPH NODES: No mediastinal, hilar or axillary lymphadenopathy. LUNGS AND PLEURA: Mild respiratory motion. Chronic emphysema. Major airways are patent with mild generalized bronchial wall and airway thickening which is more conspicuous than last year. No pleural  effusion or consolidation. No areas of acute lung inflammation. Tiny calcified granulomas in the left lower lobe are  benign (no follow up imaging recommended). 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 In 2007 this was characterized on MRI as benign adenoma, however, has been slowly enlarging over this series of exams. The lesion is stable from last year. Surgical consultation again recommended if not already done. SOFT TISSUES AND BONES: No acute bone or soft tissue abnormality. IMPRESSION: 1. No pulmonary embolism. 2. Chronic Emphysema with generalized bronchial wall / airway thickening, more conspicuous than last year. Consider acute or chronic Bronchitis. 3. Chronic heterogeneous left adrenal mass, 5.5 cm, stable from last year. Non-emergent Surgical consultation recommended if not already done last year. Electronically signed by: Helayne Hurst MD 10/17/2023 06:46 AM EDT RP Workstation: [id number]   DG Chest 2 View Result Date: 10/17/2023 CLINICAL DATA:  Weakness EXAM: CHEST - 2 VIEW COMPARISON:  10/17/2022 FINDINGS: The heart size and mediastinal contours are within normal limits. Both lungs are clear. The visualized skeletal structures are unremarkable. Loop recorder is noted. IMPRESSION: No active cardiopulmonary disease. Electronically Signed   By: Oneil Devonshire M.D.   On: 10/17/2023 01:12    CT PE study viewed and interpreted independently, agree with radiologist findings, no evidence of pulmonary embolism, evidence of chronic emphysema slightly more pronounced than previous.  Stable 5.5 cm left adrenal mass unchanged from last year.  Bilateral DVT ultrasounds without any evidence of blood clot.  I discussed these results with patient at bedside, provided reassurance.  Recommend compression stockings, DME prescription provided, discussed close follow-up with primary care provider.  There could be a variety of reasons for increased swelling, patient has been more immobile and using a wheelchair more often, could be having some venous insufficiency, also discussed dietary changes as patient  has been in the hospital frequently.   At this time there does not appear to be any evidence of an acute emergency medical condition requiring further emergent evaluation and the patient appears stable for discharge with appropriate outpatient follow up. Diagnosis and return precautions discussed with patient who verbalizes understanding and is agreeable to discharge.        Alva Larraine FALCON, PA-C 10/23/23 1319    Ula Prentice SAUNDERS, MD 10/29/23 [phone]

## 2023-11-11 ENCOUNTER — Ambulatory Visit (INDEPENDENT_AMBULATORY_CARE_PROVIDER_SITE_OTHER): Admitting: Primary Care

## 2023-11-28 ENCOUNTER — Emergency Department (HOSPITAL_COMMUNITY)

## 2023-11-28 ENCOUNTER — Other Ambulatory Visit: Payer: Self-pay

## 2023-11-28 ENCOUNTER — Inpatient Hospital Stay (HOSPITAL_COMMUNITY)
Admission: EM | Admit: 2023-11-28 | Discharge: 2023-12-03 | DRG: 853 | Disposition: A | Discharge status: Home / Self Care

## 2023-11-28 ENCOUNTER — Encounter (HOSPITAL_COMMUNITY): Payer: Self-pay | Admitting: *Deleted

## 2023-11-28 ENCOUNTER — Encounter (HOSPITAL_COMMUNITY): Admission: EM | Disposition: A | Discharge status: Home / Self Care | Payer: Self-pay | Source: Home / Self Care

## 2023-11-28 DIAGNOSIS — Z86718 Personal history of other venous thrombosis and embolism: Secondary | ICD-10-CM

## 2023-11-28 DIAGNOSIS — Z794 Long term (current) use of insulin: Secondary | ICD-10-CM

## 2023-11-28 DIAGNOSIS — K55069 Acute infarction of intestine, part and extent unspecified: Secondary | ICD-10-CM | POA: Diagnosis present

## 2023-11-28 DIAGNOSIS — J439 Emphysema, unspecified: Secondary | ICD-10-CM | POA: Diagnosis not present

## 2023-11-28 DIAGNOSIS — T383X6A Underdosing of insulin and oral hypoglycemic [antidiabetic] drugs, initial encounter: Secondary | ICD-10-CM | POA: Diagnosis not present

## 2023-11-28 DIAGNOSIS — E876 Hypokalemia: Secondary | ICD-10-CM | POA: Diagnosis present

## 2023-11-28 DIAGNOSIS — K460 Unspecified abdominal hernia with obstruction, without gangrene: Secondary | ICD-10-CM | POA: Diagnosis present

## 2023-11-28 DIAGNOSIS — R197 Diarrhea, unspecified: Secondary | ICD-10-CM | POA: Diagnosis not present

## 2023-11-28 DIAGNOSIS — Z8673 Personal history of transient ischemic attack (TIA), and cerebral infarction without residual deficits: Secondary | ICD-10-CM

## 2023-11-28 DIAGNOSIS — E871 Hypo-osmolality and hyponatremia: Secondary | ICD-10-CM | POA: Diagnosis present

## 2023-11-28 DIAGNOSIS — K219 Gastro-esophageal reflux disease without esophagitis: Secondary | ICD-10-CM | POA: Diagnosis not present

## 2023-11-28 DIAGNOSIS — R739 Hyperglycemia, unspecified: Secondary | ICD-10-CM | POA: Diagnosis not present

## 2023-11-28 DIAGNOSIS — Z86711 Personal history of pulmonary embolism: Secondary | ICD-10-CM

## 2023-11-28 DIAGNOSIS — K567 Ileus, unspecified: Secondary | ICD-10-CM | POA: Diagnosis not present

## 2023-11-28 DIAGNOSIS — D3502 Benign neoplasm of left adrenal gland: Secondary | ICD-10-CM | POA: Diagnosis present

## 2023-11-28 DIAGNOSIS — K5669 Other partial intestinal obstruction: Secondary | ICD-10-CM | POA: Diagnosis not present

## 2023-11-28 DIAGNOSIS — Z8249 Family history of ischemic heart disease and other diseases of the circulatory system: Secondary | ICD-10-CM

## 2023-11-28 DIAGNOSIS — E43 Unspecified severe protein-calorie malnutrition: Secondary | ICD-10-CM | POA: Diagnosis present

## 2023-11-28 DIAGNOSIS — E1165 Type 2 diabetes mellitus with hyperglycemia: Secondary | ICD-10-CM

## 2023-11-28 DIAGNOSIS — Z5971 Insufficient health insurance coverage: Secondary | ICD-10-CM

## 2023-11-28 DIAGNOSIS — J95821 Acute postprocedural respiratory failure: Secondary | ICD-10-CM | POA: Diagnosis not present

## 2023-11-28 DIAGNOSIS — Z885 Allergy status to narcotic agent status: Secondary | ICD-10-CM

## 2023-11-28 DIAGNOSIS — N183 Chronic kidney disease, stage 3 unspecified: Secondary | ICD-10-CM | POA: Diagnosis present

## 2023-11-28 DIAGNOSIS — I10 Essential (primary) hypertension: Secondary | ICD-10-CM | POA: Diagnosis not present

## 2023-11-28 DIAGNOSIS — D696 Thrombocytopenia, unspecified: Secondary | ICD-10-CM | POA: Diagnosis not present

## 2023-11-28 DIAGNOSIS — K56691 Other complete intestinal obstruction: Secondary | ICD-10-CM | POA: Diagnosis not present

## 2023-11-28 DIAGNOSIS — A419 Sepsis, unspecified organism: Secondary | ICD-10-CM | POA: Diagnosis not present

## 2023-11-28 DIAGNOSIS — R579 Shock, unspecified: Secondary | ICD-10-CM

## 2023-11-28 DIAGNOSIS — E440 Moderate protein-calorie malnutrition: Secondary | ICD-10-CM | POA: Insufficient documentation

## 2023-11-28 DIAGNOSIS — K660 Peritoneal adhesions (postprocedural) (postinfection): Secondary | ICD-10-CM | POA: Diagnosis not present

## 2023-11-28 DIAGNOSIS — F418 Other specified anxiety disorders: Secondary | ICD-10-CM | POA: Diagnosis not present

## 2023-11-28 DIAGNOSIS — Z880 Allergy status to penicillin: Secondary | ICD-10-CM

## 2023-11-28 DIAGNOSIS — E1122 Type 2 diabetes mellitus with diabetic chronic kidney disease: Secondary | ICD-10-CM | POA: Diagnosis not present

## 2023-11-28 DIAGNOSIS — N179 Acute kidney failure, unspecified: Secondary | ICD-10-CM | POA: Diagnosis not present

## 2023-11-28 DIAGNOSIS — F17210 Nicotine dependence, cigarettes, uncomplicated: Secondary | ICD-10-CM | POA: Diagnosis not present

## 2023-11-28 DIAGNOSIS — K55022 Diffuse acute infarction of small intestine: Secondary | ICD-10-CM | POA: Diagnosis not present

## 2023-11-28 DIAGNOSIS — Z91141 Patient's other noncompliance with medication regimen due to financial hardship: Secondary | ICD-10-CM

## 2023-11-28 DIAGNOSIS — R1111 Vomiting without nausea: Secondary | ICD-10-CM | POA: Diagnosis not present

## 2023-11-28 DIAGNOSIS — E1110 Type 2 diabetes mellitus with ketoacidosis without coma: Secondary | ICD-10-CM | POA: Diagnosis not present

## 2023-11-28 DIAGNOSIS — R6521 Severe sepsis with septic shock: Secondary | ICD-10-CM | POA: Diagnosis present

## 2023-11-28 DIAGNOSIS — Z6821 Body mass index (BMI) 21.0-21.9, adult: Secondary | ICD-10-CM

## 2023-11-28 DIAGNOSIS — K56609 Unspecified intestinal obstruction, unspecified as to partial versus complete obstruction: Secondary | ICD-10-CM | POA: Diagnosis not present

## 2023-11-28 DIAGNOSIS — I129 Hypertensive chronic kidney disease with stage 1 through stage 4 chronic kidney disease, or unspecified chronic kidney disease: Secondary | ICD-10-CM | POA: Diagnosis present

## 2023-11-28 DIAGNOSIS — J9601 Acute respiratory failure with hypoxia: Secondary | ICD-10-CM | POA: Diagnosis not present

## 2023-11-28 DIAGNOSIS — J9589 Other postprocedural complications and disorders of respiratory system, not elsewhere classified: Secondary | ICD-10-CM | POA: Diagnosis not present

## 2023-11-28 DIAGNOSIS — Z886 Allergy status to analgesic agent status: Secondary | ICD-10-CM

## 2023-11-28 DIAGNOSIS — R1084 Generalized abdominal pain: Secondary | ICD-10-CM | POA: Diagnosis not present

## 2023-11-28 DIAGNOSIS — R000 Tachycardia, unspecified: Secondary | ICD-10-CM | POA: Diagnosis not present

## 2023-11-28 HISTORY — PX: BOWEL RESECTION: SHX1257

## 2023-11-28 HISTORY — PX: LAPAROTOMY: SHX154

## 2023-11-28 LAB — COMPREHENSIVE METABOLIC PANEL WITH GFR
ALT: 45 U/L — ABNORMAL HIGH (ref 0–44)
AST: 52 U/L — ABNORMAL HIGH (ref 15–41)
Albumin: 3.5 g/dL (ref 3.5–5.0)
Alkaline Phosphatase: 101 U/L (ref 38–126)
Anion gap: 20 — ABNORMAL HIGH (ref 5–15)
BUN: 24 mg/dL — ABNORMAL HIGH (ref 6–20)
CO2: 18 mmol/L — ABNORMAL LOW (ref 22–32)
Calcium: 8.9 mg/dL (ref 8.9–10.3)
Chloride: 95 mmol/L — ABNORMAL LOW (ref 98–111)
Creatinine, Ser: 1.23 mg/dL — ABNORMAL HIGH (ref 0.44–1.00)
GFR, Estimated: 51 mL/min — ABNORMAL LOW (ref >60)
Glucose, Bld: 477 mg/dL — ABNORMAL HIGH (ref 70–99)
Potassium: 4.6 mmol/L (ref 3.5–5.1)
Sodium: 133 mmol/L — ABNORMAL LOW (ref 135–145)
Total Bilirubin: 1.3 mg/dL — ABNORMAL HIGH (ref 0.0–1.2)
Total Protein: 6.9 g/dL (ref 6.5–8.1)

## 2023-11-28 LAB — I-STAT CHEM 8, ED
BUN: 29 mg/dL — ABNORMAL HIGH (ref 6–20)
Calcium, Ion: 0.99 mmol/L — ABNORMAL LOW (ref 1.15–1.40)
Chloride: 101 mmol/L (ref 98–111)
Creatinine, Ser: 1 mg/dL (ref 0.44–1.00)
Glucose, Bld: 467 mg/dL — ABNORMAL HIGH (ref 70–99)
HCT: 56 % — ABNORMAL HIGH (ref 36.0–46.0)
Hemoglobin: 19 g/dL — ABNORMAL HIGH (ref 12.0–15.0)
Potassium: 4.7 mmol/L (ref 3.5–5.1)
Sodium: 134 mmol/L — ABNORMAL LOW (ref 135–145)
TCO2: 23 mmol/L (ref 22–32)

## 2023-11-28 LAB — CBC WITH DIFFERENTIAL/PLATELET
Abs Immature Granulocytes: 0.03 K/uL (ref 0.00–0.07)
Basophils Absolute: 0 K/uL (ref 0.0–0.1)
Basophils Relative: 0 %
Eosinophils Absolute: 0 K/uL (ref 0.0–0.5)
Eosinophils Relative: 0 %
HCT: 54.1 % — ABNORMAL HIGH (ref 36.0–46.0)
Hemoglobin: 17.8 g/dL — ABNORMAL HIGH (ref 12.0–15.0)
Immature Granulocytes: 0 %
Lymphocytes Relative: 4 %
Lymphs Abs: 0.4 K/uL — ABNORMAL LOW (ref 0.7–4.0)
MCH: 31.3 pg (ref 26.0–34.0)
MCHC: 32.9 g/dL (ref 30.0–36.0)
MCV: 95.2 fL (ref 80.0–100.0)
Monocytes Absolute: 0.7 K/uL (ref 0.1–1.0)
Monocytes Relative: 7 %
Neutro Abs: 9 K/uL — ABNORMAL HIGH (ref 1.7–7.7)
Neutrophils Relative %: 89 %
Platelets: 197 K/uL (ref 150–400)
RBC: 5.68 MIL/uL — ABNORMAL HIGH (ref 3.87–5.11)
RDW: 13 % (ref 11.5–15.5)
WBC: 10.1 K/uL (ref 4.0–10.5)
nRBC: 0 % (ref 0.0–0.2)

## 2023-11-28 LAB — I-STAT VENOUS BLOOD GAS, ED
Acid-base deficit: 3 mmol/L — ABNORMAL HIGH (ref 0.0–2.0)
Bicarbonate: 23.1 mmol/L (ref 20.0–28.0)
Calcium, Ion: 1.04 mmol/L — ABNORMAL LOW (ref 1.15–1.40)
HCT: 55 % — ABNORMAL HIGH (ref 36.0–46.0)
Hemoglobin: 18.7 g/dL — ABNORMAL HIGH (ref 12.0–15.0)
O2 Saturation: 77 %
Potassium: 4.6 mmol/L (ref 3.5–5.1)
Sodium: 134 mmol/L — ABNORMAL LOW (ref 135–145)
TCO2: 24 mmol/L (ref 22–32)
pCO2, Ven: 42 mmHg — ABNORMAL LOW (ref 44–60)
pH, Ven: 7.348 (ref 7.25–7.43)
pO2, Ven: 44 mmHg (ref 32–45)

## 2023-11-28 LAB — BETA-HYDROXYBUTYRIC ACID
Beta-Hydroxybutyric Acid: 1.5 mmol/L — ABNORMAL HIGH (ref 0.05–0.27)
Beta-Hydroxybutyric Acid: 1.95 mmol/L — ABNORMAL HIGH (ref 0.05–0.27)

## 2023-11-28 LAB — BASIC METABOLIC PANEL WITH GFR
Anion gap: 21 — ABNORMAL HIGH (ref 5–15)
BUN: 27 mg/dL — ABNORMAL HIGH (ref 6–20)
CO2: 17 mmol/L — ABNORMAL LOW (ref 22–32)
Calcium: 8.8 mg/dL — ABNORMAL LOW (ref 8.9–10.3)
Chloride: 94 mmol/L — ABNORMAL LOW (ref 98–111)
Creatinine, Ser: 1.32 mg/dL — ABNORMAL HIGH (ref 0.44–1.00)
GFR, Estimated: 47 mL/min — ABNORMAL LOW (ref >60)
Glucose, Bld: 553 mg/dL (ref 70–99)
Potassium: 4.6 mmol/L (ref 3.5–5.1)
Sodium: 132 mmol/L — ABNORMAL LOW (ref 135–145)

## 2023-11-28 LAB — CBG MONITORING, ED
Glucose-Capillary: 433 mg/dL — ABNORMAL HIGH (ref 70–99)
Glucose-Capillary: 456 mg/dL — ABNORMAL HIGH (ref 70–99)
Glucose-Capillary: 539 mg/dL (ref 70–99)
Glucose-Capillary: 457 mg/dL — ABNORMAL HIGH (ref 70–99)
Glucose-Capillary: 380 mg/dL — ABNORMAL HIGH (ref 70–99)
Glucose-Capillary: 402 mg/dL — ABNORMAL HIGH (ref 70–99)

## 2023-11-28 LAB — LIPASE, BLOOD: Lipase: 24 U/L (ref 11–51)

## 2023-11-28 LAB — TROPONIN I (HIGH SENSITIVITY): Troponin I (High Sensitivity): 6 ng/L (ref <18)

## 2023-11-28 SURGERY — LAPAROTOMY, EXPLORATORY
Anesthesia: General | Site: Abdomen

## 2023-11-28 MED ORDER — ONDANSETRON HCL 4 MG/2ML IJ SOLN
4.0000 mg | Freq: Once | INTRAMUSCULAR | Status: AC
  Administered 2023-11-28: 4 mg via INTRAVENOUS
  Filled 2023-11-28: qty 2

## 2023-11-28 MED ORDER — MIDAZOLAM HCL 2 MG/2ML IJ SOLN
INTRAMUSCULAR | Status: AC
  Filled 2023-11-28: qty 2

## 2023-11-28 MED ORDER — DEXTROSE IN LACTATED RINGERS 5 % IV SOLN: INTRAVENOUS | Status: AC

## 2023-11-28 MED ORDER — LIDOCAINE 2% (20 MG/ML) 5 ML SYRINGE
INTRAMUSCULAR | Status: AC
  Filled 2023-11-28: qty 5

## 2023-11-28 MED ORDER — FENTANYL CITRATE (PF) 250 MCG/5ML IJ SOLN
INTRAMUSCULAR | Status: AC
  Filled 2023-11-28: qty 5

## 2023-11-28 MED ORDER — METRONIDAZOLE 500 MG/100ML IV SOLN
500.0000 mg | Freq: Once | INTRAVENOUS | Status: AC
  Administered 2023-11-29: 500 mg via INTRAVENOUS
  Filled 2023-11-28: qty 100

## 2023-11-28 MED ORDER — ONDANSETRON HCL 4 MG/2ML IJ SOLN
INTRAMUSCULAR | Status: AC
Start: 2023-11-28 — End: 2023-11-28
  Filled 2023-11-28: qty 2

## 2023-11-28 MED ORDER — PROPOFOL 10 MG/ML IV BOLUS
INTRAVENOUS | Status: AC
  Filled 2023-11-28: qty 20

## 2023-11-28 MED ORDER — INSULIN REGULAR(HUMAN) IN NACL 100-0.9 UT/100ML-% IV SOLN
INTRAVENOUS | Status: DC
  Administered 2023-11-28: 9 [IU]/h via INTRAVENOUS
  Filled 2023-11-28: qty 100

## 2023-11-28 MED ORDER — DEXTROSE 50 % IV SOLN: 0.0000 mL | INTRAVENOUS | Status: DC | PRN

## 2023-11-28 MED ORDER — LACTATED RINGERS IV BOLUS
20.0000 mL/kg | Freq: Once | INTRAVENOUS | Status: AC
  Administered 2023-11-28: 1206 mL via INTRAVENOUS

## 2023-11-28 MED ORDER — SODIUM CHLORIDE 0.9 % IV SOLN
2.0000 g | Freq: Once | INTRAVENOUS | Status: AC
  Administered 2023-11-29: 2 g via INTRAVENOUS
  Filled 2023-11-28: qty 20

## 2023-11-28 MED ORDER — SODIUM CHLORIDE 0.9 % IV BOLUS: 1000.0000 mL | Freq: Once | INTRAVENOUS | Status: DC

## 2023-11-28 MED ORDER — LACTATED RINGERS IV SOLN: INTRAVENOUS | Status: DC

## 2023-11-28 MED ORDER — KETAMINE HCL 50 MG/5ML IJ SOSY
PREFILLED_SYRINGE | INTRAMUSCULAR | Status: AC
  Filled 2023-11-28: qty 5

## 2023-11-28 MED ORDER — SUCCINYLCHOLINE CHLORIDE 200 MG/10ML IV SOSY
PREFILLED_SYRINGE | INTRAVENOUS | Status: AC
Start: 2023-11-28 — End: 2023-11-28
  Filled 2023-11-28: qty 10

## 2023-11-28 MED ORDER — IOHEXOL 350 MG/ML SOLN
75.0000 mL | Freq: Once | INTRAVENOUS | Status: AC | PRN
  Administered 2023-11-28: 75 mL via INTRAVENOUS

## 2023-11-28 MED ORDER — HYDROMORPHONE HCL 1 MG/ML IJ SOLN
1.0000 mg | Freq: Once | INTRAMUSCULAR | Status: AC
  Administered 2023-11-28: 1 mg via INTRAVENOUS
  Filled 2023-11-28: qty 1

## 2023-11-28 MED ORDER — POTASSIUM CHLORIDE 10 MEQ/100ML IV SOLN
10.0000 meq | INTRAVENOUS | Status: AC
  Administered 2023-11-28 (×2): 10 meq via INTRAVENOUS
  Filled 2023-11-28 (×2): qty 100

## 2023-11-28 SURGICAL SUPPLY — 43 items
BAG COUNTER SPONGE SURGICOUNT (BAG) ×3 IMPLANT
BLADE CLIPPER SURG (BLADE) IMPLANT
CANISTER SUCTION 3000ML PPV (SUCTIONS) ×3 IMPLANT
CANISTER WOUND CARE 500ML ATS (WOUND CARE) IMPLANT
CHLORAPREP W/TINT 26 (MISCELLANEOUS) ×3 IMPLANT
COVER SURGICAL LIGHT HANDLE (MISCELLANEOUS) ×3 IMPLANT
DRAPE LAPAROSCOPIC ABDOMINAL (DRAPES) ×3 IMPLANT
DRAPE WARM FLUID 44X44 (DRAPES) ×3 IMPLANT
DRSG OPSITE POSTOP 4X10 (GAUZE/BANDAGES/DRESSINGS) IMPLANT
DRSG OPSITE POSTOP 4X8 (GAUZE/BANDAGES/DRESSINGS) IMPLANT
ELECT BLADE 6.5 EXT (BLADE) IMPLANT
ELECT CAUTERY BLADE 6.4 (BLADE) ×3 IMPLANT
ELECTRODE REM PT RTRN 9FT ADLT (ELECTROSURGICAL) ×3 IMPLANT
GLOVE BIO SURGEON STRL SZ8 (GLOVE) ×3 IMPLANT
GLOVE BIOGEL PI IND STRL 8 (GLOVE) ×3 IMPLANT
GLOVE ECLIPSE 6.5 STRL STRAW (GLOVE) IMPLANT
GLOVE SS BIOGEL STRL SZ 7.5 (GLOVE) IMPLANT
GLOVE SURG SS PI 8.0 STRL IVOR (GLOVE) IMPLANT
GOWN STRL REUS W/ TWL LRG LVL3 (GOWN DISPOSABLE) ×3 IMPLANT
GOWN STRL REUS W/ TWL XL LVL3 (GOWN DISPOSABLE) ×3 IMPLANT
HANDLE SUCTION POOLE (INSTRUMENTS) ×3 IMPLANT
KIT BASIN OR (CUSTOM PROCEDURE TRAY) ×3 IMPLANT
KIT TURNOVER KIT B (KITS) ×3 IMPLANT
LIGASURE IMPACT 36 18CM CVD LR (INSTRUMENTS) IMPLANT
PACK GENERAL/GYN (CUSTOM PROCEDURE TRAY) ×3 IMPLANT
PAD ARMBOARD POSITIONER FOAM (MISCELLANEOUS) ×3 IMPLANT
RELOAD STAPLE 75 3.8 BLU REG (ENDOMECHANICALS) IMPLANT
SOLN 0.9% NACL POUR BTL 1000ML (IV SOLUTION) ×6 IMPLANT
SPECIMEN JAR LARGE (MISCELLANEOUS) IMPLANT
SPONGE ABD ABTHERA ADVANCE (MISCELLANEOUS) IMPLANT
SPONGE T-LAP 18X18 ~~LOC~~+RFID (SPONGE) IMPLANT
STAPLER GUN LINEAR PROX 60 (STAPLE) IMPLANT
STAPLER PROXIMATE 75MM BLUE (STAPLE) IMPLANT
STAPLER SKIN PROX 35W (STAPLE) ×3 IMPLANT
SUT PDS AB 1 TP1 96 (SUTURE) ×6 IMPLANT
SUT SILK 2 0 SH CR/8 (SUTURE) ×3 IMPLANT
SUT SILK 2 0 TIES 10X30 (SUTURE) ×3 IMPLANT
SUT SILK 3 0 SH CR/8 (SUTURE) ×3 IMPLANT
SUT SILK 3 0 TIES 10X30 (SUTURE) ×3 IMPLANT
TOWEL GREEN STERILE (TOWEL DISPOSABLE) ×3 IMPLANT
TRAY FOLEY MTR SLVR 14FR STAT (SET/KITS/TRAYS/PACK) IMPLANT
TRAY FOLEY MTR SLVR 16FR STAT (SET/KITS/TRAYS/PACK) IMPLANT
YANKAUER SUCT BULB TIP NO VENT (SUCTIONS) IMPLANT

## 2023-11-28 NOTE — ED Provider Triage Note (Signed)
 Emergency Medicine Provider Triage Evaluation Note  [name] , a 58 y.o. female  was evaluated in triage.  Pt complains of diabetic, noncompliant.  Review of Systems  Positive: Nausea, vomiting, diarrhea, abdominal pain, family members with same Negative: Fever, chest pain, shortness of breath  Physical Exam  BP (!) 141/97   Pulse (!) 118   Temp (!) 97.4 F (36.3 C) (Oral)   Resp 16   LMP  (LMP Unknown) Comment: a few months  SpO2 100%  Gen:   Awake, no distress, uncomfortable appearing Resp:  Normal effort  MSK:   Moves extremities without difficulty  Other:    Medical Decision Making  Medically screening exam initiated at 6:03 PM.  Appropriate orders placed.  [name] was informed that the remainder of the evaluation will be completed by another provider, this initial triage assessment does not replace that evaluation, and the importance of remaining in the ED until their evaluation is complete.  Labs ordered    Francis Ileana SAILOR, PA-C 11/28/23 1805

## 2023-11-28 NOTE — ED Triage Notes (Addendum)
 BIB [facility] from home for abd pain, abd distention, NVD, and elevated BS> BS 401 with EMS. NSL 20g L wrist, NS IVF bolus and zofran  4mg  given. Alert, NAD, calm, interactive, resps e/u. Abd is distended. VSS: HR 110, 142/90, RR 20 99% RA. States, didn't know I was suppose to take insulin . Non-adherent with meds. Viral illnes in family last week. Sx onset Saturday, 5d ago. Sitting in w/c. EDPA present on arrival. H/o CVA with R sided deficits. Ambulatory.

## 2023-11-28 NOTE — Consult Note (Signed)
 Reason for Consult:SBO with ischemic bowel Referring Physician: [name]  [name] is an 58 y.o. female.  HPI: 58yo F with PMHx DM and stroke brought by EMS to the ED with 3d HX abdominal pain and distention. CBG was 539 and that is being treated. CT A/P shows internal hernia with small bowel ischemia and infarction. I was called for surgical management. No history of abdominal surgery. She is not a good historian.  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;  Surgeon: Shari Easter, MD;  Location: [facility] OR;  Service: Orthopedics;  Laterality: Right;  needs 60 min   LOOP RECORDER IMPLANT N/A 03/25/2014   Procedure: LOOP RECORDER IMPLANT;  Surgeon: Elspeth JAYSON Sage, MD;  Location: [facility] CATH LAB;  Service: Cardiovascular;  Laterality: N/A;   NO PAST SURGERIES     TEE WITHOUT CARDIOVERSION N/A 03/25/2014   Procedure: TRANSESOPHAGEAL ECHOCARDIOGRAM (TEE);  Surgeon: Vinie JAYSON Maxcy, MD;  Location: [facility] ENDOSCOPY;  Service: Cardiovascular;  Laterality: N/A;    Family History  Problem Relation Age of Onset   Heart attack Father 79    Social History:  reports that she has been smoking cigarettes. She has a 60 pack-year smoking history. She has never used smokeless tobacco. She reports that she does not drink alcohol and does not use drugs.  Allergies:  Allergies  Allergen Reactions   Amoxicillin Hives   Aspirin Hives   Codeine Hives   Penicillins Hives    Has  patient had a PCN reaction causing immediate rash, facial/tongue/throat swelling, SOB or lightheadedness with hypotension: Yes Has patient had a PCN reaction causing severe rash involving mucus membranes or skin necrosis: No Has patient had a PCN reaction that required hospitalization No Has patient had a PCN reaction occurring within the last 10 years: Yes If all of the above answers are NO, then may proceed with Cephalosporin use.     Medications: I have reviewed the patient's current medications.  Results for orders placed or performed during the hospital encounter of 11/28/23 (from the past 48 hours)  CBG monitoring, ED     Status: Abnormal   Collection Time: 11/28/23  6:05 PM  Result Value Ref Range   Glucose-Capillary 433 (H) 70 - 99 mg/dL    Comment: Glucose reference range applies only to samples taken after fasting for at least 8 hours.  Comprehensive metabolic panel     Status: Abnormal   Collection Time: 11/28/23  6:10 PM  Result Value Ref Range   Sodium 133 (L) 135 - 145 mmol/L   Potassium 4.6 3.5 - 5.1 mmol/L   Chloride 95 (L) 98 - 111 mmol/L   CO2 18 (L) 22 - 32 mmol/L   Glucose, Bld 477 (H) 70 - 99 mg/dL    Comment: Glucose reference range applies only to samples taken after fasting for at least 8 hours.   BUN 24 (H) 6 -  20 mg/dL   Creatinine, Ser 8.76 (H) 0.44 - 1.00 mg/dL   Calcium  8.9 8.9 - 10.3 mg/dL   Total Protein 6.9 6.5 - 8.1 g/dL   Albumin  3.5 3.5 - 5.0 g/dL   AST 52 (H) 15 - 41 U/L   ALT 45 (H) 0 - 44 U/L   Alkaline Phosphatase 101 38 - 126 U/L   Total Bilirubin 1.3 (H) 0.0 - 1.2 mg/dL   GFR, Estimated 51 (L) >60 mL/min    Comment: (NOTE) Calculated using the CKD-EPI Creatinine Equation (2021)    Anion gap 20 (H) 5 - 15    Comment: Performed at [facility], [address], [place], [place] [address]  Lipase, blood     Status: None   Collection Time: 11/28/23  6:10 PM  Result Value Ref Range   Lipase 24 11 - 51 U/L    Comment: Performed at  [facility], [address] [place], [place] [address]  CBC with Differential     Status: Abnormal   Collection Time: 11/28/23  6:10 PM  Result Value Ref Range   WBC 10.1 4.0 - 10.5 K/uL   RBC 5.68 (H) 3.87 - 5.11 MIL/uL   Hemoglobin 17.8 (H) 12.0 - 15.0 g/dL   HCT 45.8 (H) 63.9 - 53.9 %   MCV 95.2 80.0 - 100.0 fL   MCH 31.3 26.0 - 34.0 pg   MCHC 32.9 30.0 - 36.0 g/dL   RDW 86.9 88.4 - 84.4 %   Platelets 197 150 - 400 K/uL   nRBC 0.0 0.0 - 0.2 %   Neutrophils Relative % 89 %   Neutro Abs 9.0 (H) 1.7 - 7.7 K/uL   Lymphocytes Relative 4 %   Lymphs Abs 0.4 (L) 0.7 - 4.0 K/uL   Monocytes Relative 7 %   Monocytes Absolute 0.7 0.1 - 1.0 K/uL   Eosinophils Relative 0 %   Eosinophils Absolute 0.0 0.0 - 0.5 K/uL   Basophils Relative 0 %   Basophils Absolute 0.0 0.0 - 0.1 K/uL   Immature Granulocytes 0 %   Abs Immature Granulocytes 0.03 0.00 - 0.07 K/uL    Comment: Performed at [facility], [address], [place], [place] [address]  Beta-hydroxybutyric acid     Status: Abnormal   Collection Time: 11/28/23  6:10 PM  Result Value Ref Range   Beta-Hydroxybutyric Acid 1.50 (H) 0.05 - 0.27 mmol/L    Comment: Performed at [facility], [address], [place], [place] [address]  I-Stat venous blood gas, [facility] ED, [facility], [facility])     Status: Abnormal   Collection Time: 11/28/23  6:30 PM  Result Value Ref Range   pH, Ven 7.348 7.25 - 7.43   pCO2, Ven 42.0 (L) 44 - 60 mmHg   pO2, Ven 44 32 - 45 mmHg   Bicarbonate 23.1 20.0 - 28.0 mmol/L   TCO2 24 22 - 32 mmol/L   O2 Saturation 77 %   Acid-base deficit 3.0 (H) 0.0 - 2.0 mmol/L   Sodium 134 (L) 135 - 145 mmol/L   Potassium 4.6 3.5 - 5.1 mmol/L   Calcium , Ion 1.04 (L) 1.15 - 1.40 mmol/L   HCT 55.0 (H) 36.0 - 46.0 %   Hemoglobin 18.7 (H) 12.0 - 15.0 g/dL   Sample type VENOUS   I-stat chem 8, ED (not at [facility], [facility] or [facility])     Status: Abnormal   Collection Time: 11/28/23  6:30 PM  Result  Value Ref Range   Sodium 134 (L) 135 -  145 mmol/L   Potassium 4.7 3.5 - 5.1 mmol/L   Chloride 101 98 - 111 mmol/L   BUN 29 (H) 6 - 20 mg/dL   Creatinine, Ser 8.99 0.44 - 1.00 mg/dL   Glucose, Bld 532 (H) 70 - 99 mg/dL    Comment: Glucose reference range applies only to samples taken after fasting for at least 8 hours.   Calcium , Ion 0.99 (L) 1.15 - 1.40 mmol/L   TCO2 23 22 - 32 mmol/L   Hemoglobin 19.0 (H) 12.0 - 15.0 g/dL   HCT 43.9 (H) 63.9 - 53.9 %  Troponin I (High Sensitivity)     Status: None   Collection Time: 11/28/23  8:19 PM  Result Value Ref Range   Troponin I (High Sensitivity) 6 <18 ng/L    Comment: (NOTE) Elevated high sensitivity troponin I (hsTnI) values and significant  changes across serial measurements may suggest ACS but many other  chronic and acute conditions are known to elevate hsTnI results.  Refer to the Links section for chest pain algorithms and additional  guidance. Performed at [facility], [address], [place], [place] [address]   Basic metabolic panel     Status: Abnormal   Collection Time: 11/28/23  8:19 PM  Result Value Ref Range   Sodium 132 (L) 135 - 145 mmol/L   Potassium 4.6 3.5 - 5.1 mmol/L   Chloride 94 (L) 98 - 111 mmol/L   CO2 17 (L) 22 - 32 mmol/L   Glucose, Bld 553 (HH) 70 - 99 mg/dL    Comment: CRITICAL RESULT CALLED TO, READ BACK BY AND VERIFIED WITH C BOSTIAN RN [id number] 11/28/2023 BONDW Glucose reference range applies only to samples taken after fasting for at least 8 hours.    BUN 27 (H) 6 - 20 mg/dL   Creatinine, Ser 8.67 (H) 0.44 - 1.00 mg/dL   Calcium  8.8 (L) 8.9 - 10.3 mg/dL   GFR, Estimated 47 (L) >60 mL/min    Comment: (NOTE) Calculated using the CKD-EPI Creatinine Equation (2021)    Anion gap 21 (H) 5 - 15    Comment: ELECTROLYTES REPEATED TO VERIFY Performed at [facility], [address], [place], [place] [address]   Beta-hydroxybutyric acid     Status: Abnormal   Collection Time: 11/28/23  8:19 PM  Result Value Ref Range    Beta-Hydroxybutyric Acid 1.95 (H) 0.05 - 0.27 mmol/L    Comment: Performed at [facility], [address] [place], [place] [address]  CBG monitoring, ED     Status: Abnormal   Collection Time: 11/28/23  8:33 PM  Result Value Ref Range   Glucose-Capillary 539 (HH) 70 - 99 mg/dL    Comment: Glucose reference range applies only to samples taken after fasting for at least 8 hours.   Comment 1 Notify RN   CBG monitoring, ED     Status: Abnormal   Collection Time: 11/28/23  9:17 PM  Result Value Ref Range   Glucose-Capillary 456 (H) 70 - 99 mg/dL    Comment: Glucose reference range applies only to samples taken after fasting for at least 8 hours.  CBG monitoring, ED     Status: Abnormal   Collection Time: 11/28/23  9:56 PM  Result Value Ref Range   Glucose-Capillary 457 (H) 70 - 99 mg/dL    Comment: Glucose reference range applies only to samples taken after fasting for at  least 8 hours.  CBG monitoring, ED     Status: Abnormal   Collection Time: 11/28/23 10:31 PM  Result Value Ref Range   Glucose-Capillary 402 (H) 70 - 99 mg/dL    Comment: Glucose reference range applies only to samples taken after fasting for at least 8 hours.    CT ABDOMEN PELVIS W CONTRAST Result Date: 11/28/2023 EXAM: CTA CHEST PE WITH AND WITHOUT CONTRAST CT ABDOMEN AND PELVIS WITH AND WITHOUT CONTRAST 11/28/2023 09:45:00 PM TECHNIQUE: CTA of the chest was performed after the administration of 75 mL of iohexol  (OMNIPAQUE ) 350 MG/ML injection. Multiplanar reformatted images are provided for review. MIP images are provided for review. CT of the abdomen and pelvis was performed with and without the administration of intravenous contrast. Automated exposure control, iterative reconstruction, and/or weight based adjustment of the mA/kV was utilized to reduce the radiation dose to as low as reasonably achievable. COMPARISON: None available. CLINICAL HISTORY: Known history of PE. Tachycardia with chest pain. FINDINGS:  CHEST: PULMONARY ARTERIES: Pulmonary arteries are adequately opacified for evaluation. No intraluminal filling defect to suggest pulmonary embolism. Main pulmonary artery is normal in caliber. MEDIASTINUM: Mild coronary artery calcification. Global cardiac size within normal limits. No pericardial effusion. No mediastinal lymphadenopathy. Mild atherosclerotic calcification within the thoracic aorta. No aortic aneurysm. LUNGS AND PLEURA: Moderate emphysema. No focal consolidation or pulmonary edema. No pleural effusion or pneumothorax. SOFT TISSUES AND BONES: Osseous structures are age appropriate. No acute bone abnormality. No lytic or blastic bone lesion. No acute soft tissue abnormality. 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 No enhancing intrahepatic mass. GALLBLADDER AND BILE DUCTS: Gallbladder is unremarkable. No biliary ductal dilatation. SPLEEN: Spleen demonstrates no acute abnormality. PANCREAS: Pancreas demonstrates no acute abnormality. ADRENAL GLANDS: Heterogeneously enhancing mixed attenuation mass within the left adrenal gland is again identified in keeping with a slow growing adrenal adenoma that are characterized on multiple prior examinations demonstrating interval growth since remote prior examination is now measuring 5.9 cm in greatest dimension (previously measuring 4.3 cm in 2014). Right adrenal gland is unremarkable. KIDNEYS, URETERS AND BLADDER: No stones in the kidneys or ureters. No hydronephrosis. No perinephric or periureteral stranding. Urinary bladder is unremarkable. GI AND BOWEL: There is an internal hernia present with severe extrinsic compression of several large jejunal veins and marked mass effect on the superior mesenteric vein at the point of entry within the hernia which is essentially at the mesenteric root. There is resultant mesenteric edema secondary to mesenteric venous  obstruction. The herniated bowel involves almost the entire length of small bowel which is markedly dilated and fluid filled. There is, additionally, evidence of mesenteric ischemia involving at least 1 loop of bowel within the left upper quadrant best seen on coronal image 62 and axial image 36. 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 The stomach is fluid-filled and distended with fluid within the distal esophagus in keeping with changes of gastroesophageal reflux. Mild sigmoid diverticulosis. The large bowel is otherwise unremarkable. Appendix normal. REPRODUCTIVE: Reproductive organs are unremarkable. PERITONEUM AND RETROPERITONEUM: Mild ascites. No gross free intraperitoneal gas. LYMPH NODES: No lymphadenopathy. BONES AND SOFT TISSUES: Osseous structures are age appropriate. No acute bone abnormality. No lytic or blastic bone lesion. No focal soft tissue abnormality. Mild aortoiliac atherosclerotic calcification. No aortic aneurysm. IMPRESSION: 1. No pulmonary embolism. 2. Internal hernia with severe extrinsic compression  of jejunal veins and marked mass effect on the superior mesenteric vein, resulting in mesenteric edema and bowel ischemia secondary to venous outflow obstruction; findings of segmental bowel infarction with pneumatosis and portal venous gas. While a single loop of bowel appears infarcted, the vast majority of small bowel appears viable at this point. As noted above, the herniated bowel involves nearly the entire length of small bowel. Emergent surgical evaluation recommended. 3. Moderate pulmonary emphysema. Given emphysema as an independent lung cancer risk factor, consider evaluation for low-dose CT lung cancer screening if the patient is 58 years old and meets eligibility criteria. 4. Enlarging left adrenal mass now 5.9 cm, previously 4.3 cm in 2014. Recommend surgical consultation and biochemical evaluation  for a functioning adrenal lesion (e.g., pheochromocytoma) prior to considering resection. - the critical findings of this study were discussed directly with Dr. Simon by myself at 10:35 pm. Electronically signed by: Dorethia Molt MD 11/28/2023 10:38 PM EST RP Workstation: [id number]   CT Angio Chest PE W and/or Wo Contrast Result Date: 11/28/2023 EXAM: CTA CHEST PE WITH AND WITHOUT CONTRAST CT ABDOMEN AND PELVIS WITH AND WITHOUT CONTRAST 11/28/2023 09:45:00 PM TECHNIQUE: CTA of the chest was performed after the administration of 75 mL of iohexol  (OMNIPAQUE ) 350 MG/ML injection. Multiplanar reformatted images are provided for review. MIP images are provided for review. CT of the abdomen and pelvis was performed with and without the administration of intravenous contrast. Automated exposure control, iterative reconstruction, and/or weight based adjustment of the mA/kV was utilized to reduce the radiation dose to as low as reasonably achievable. COMPARISON: None available. CLINICAL HISTORY: Known history of PE. Tachycardia with chest pain. FINDINGS: CHEST: PULMONARY ARTERIES: Pulmonary arteries are adequately opacified for evaluation. No intraluminal filling defect to suggest pulmonary embolism. Main pulmonary artery is normal in caliber. MEDIASTINUM: Mild coronary artery calcification. Global cardiac size within normal limits. No pericardial effusion. No mediastinal lymphadenopathy. Mild atherosclerotic calcification within the thoracic aorta. No aortic aneurysm. LUNGS AND PLEURA: Moderate emphysema. No focal consolidation or pulmonary edema. No pleural effusion or pneumothorax. SOFT TISSUES AND BONES: Osseous structures are age appropriate. No acute bone abnormality. No lytic or blastic bone lesion. No acute soft tissue abnormality. 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 No enhancing intrahepatic mass. GALLBLADDER  AND BILE DUCTS: Gallbladder is unremarkable. No biliary ductal dilatation. SPLEEN: Spleen demonstrates no acute abnormality. PANCREAS: Pancreas demonstrates no acute abnormality. ADRENAL GLANDS: Heterogeneously enhancing mixed attenuation mass within the left adrenal gland is again identified in keeping with a slow growing adrenal adenoma that are characterized on multiple prior examinations demonstrating interval growth since remote prior examination is now measuring 5.9 cm in greatest dimension (previously measuring 4.3 cm in 2014). Right adrenal gland is unremarkable. KIDNEYS, URETERS AND BLADDER: No stones in the kidneys or ureters. No hydronephrosis. No perinephric or periureteral stranding. Urinary bladder is unremarkable. GI AND BOWEL: There is an internal hernia present with severe extrinsic compression of several large jejunal veins and marked mass effect on the superior mesenteric vein at the point of entry within the hernia which is essentially at the mesenteric root. 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 The herniated bowel involves almost the entire length of small bowel which is markedly dilated and fluid filled. There is, additionally, evidence of mesenteric ischemia involving at least 1 loop of bowel within the left upper quadrant best seen on coronal image 62 and  axial image 36. 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 The stomach is fluid-filled and distended with fluid within the distal esophagus in keeping with changes of gastroesophageal reflux. Mild sigmoid diverticulosis. The large bowel is otherwise unremarkable. Appendix normal. REPRODUCTIVE: Reproductive organs are unremarkable. PERITONEUM AND RETROPERITONEUM: Mild ascites. No gross free intraperitoneal gas. LYMPH NODES: No lymphadenopathy. BONES AND SOFT TISSUES: Osseous structures are age appropriate. No acute bone  abnormality. No lytic or blastic bone lesion. No focal soft tissue abnormality. Mild aortoiliac atherosclerotic calcification. No aortic aneurysm. IMPRESSION: 1. No pulmonary embolism. 2. 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 While a single loop of bowel appears infarcted, the vast majority of small bowel appears viable at this point. As noted above, the herniated bowel involves nearly the entire length of small bowel. Emergent surgical evaluation recommended. 3. Moderate pulmonary emphysema. Given emphysema as an independent lung cancer risk factor, consider evaluation for low-dose CT lung cancer screening if the patient is 58 years old and meets eligibility criteria. 4. Enlarging left adrenal mass now 5.9 cm, previously 4.3 cm in 2014. Recommend surgical consultation and biochemical evaluation for a functioning adrenal lesion (e.g., pheochromocytoma) prior to considering resection. - the critical findings of this study were discussed directly with Dr. Simon by myself at 10:35 pm. Electronically signed by: Dorethia Molt MD 11/28/2023 10:38 PM EST RP Workstation: [id number]    Review of Systems  Constitutional:  Positive for appetite change.  HENT: Negative.    Eyes: Negative.   Respiratory: Negative.    Cardiovascular: Negative.   Gastrointestinal:  Positive for abdominal distention and abdominal pain.  Endocrine: Negative.   Genitourinary: Negative.   Musculoskeletal: Negative.   Allergic/Immunologic: Negative.   Neurological: Negative.   Hematological: Negative.   Psychiatric/Behavioral: Negative.     Blood pressure 135/87, pulse (!) 117, temperature 98.3 F (36.8 C), temperature source Oral, resp. rate (!) 21, weight 60.3 kg, SpO2  99%. Physical Exam HENT:     Head: Normocephalic.  Cardiovascular:     Rate and Rhythm: Tachycardia present.  Pulmonary:     Effort: Pulmonary effort is normal.     Breath sounds: Normal breath sounds.  Abdominal:     General: There is distension.     Tenderness: There is abdominal tenderness in the right lower quadrant, epigastric area and periumbilical area. There is no guarding or rebound.  Skin:    General: Skin is dry.     Capillary Refill: Capillary refill takes 2 to 3 seconds.  Neurological:     Mental Status: She is alert and oriented to person, place, and time.  Psychiatric:        Mood and Affect: Mood normal.     Assessment/Plan: SBO from internal hernia with bowel compromise and evidence of infarction - to OR for emergent ex lap and small bowel resection. It is likely she will need to have the remaining viable bowel left in discontinuity and have an additional surgery. We called her son in the room and I discussed the CT findings and the plan including the risks and benefits. He agrees and we documented phone consent. The patient is also in agreement but has received dilaudid  so cannot consent. We will proceed emergently and I will  consult CCM for ongoing post-op care. Rocephin  and flagyl  IV.  Dann FORBES Hummer 11/28/2023, 11:06 PM

## 2023-11-28 NOTE — ED Provider Notes (Signed)
 [place] EMERGENC[facility] DEPARTMENT AT [facility] Provider Note   CSN: [id number] Arrival date & time: 11/28/23  1758     Patient presents with: Abdominal Pain   [name] is a 58 y.o. female.    Abdominal Pain    Presents with abdominal pain.  Nausea and vomiting.  Lower abdominal pain.  Progressed over the past couple days.  Still passing flatulence.  Last bowel movement was yesterday morning.  Patient Dors is some chest pain as well.  She states it feels more GERD related.  Denies any recent abdominal surgeries.  No fever no chills.  No sick contacts.  No dysuria.  Patient states that she has not been compliant with insulin .  Patient states that she lost her insurance and subsequently regained insurance but since not been able to fill her insulin .  No headache.  No pleuritic chest pain or hemoptysis.  Previous medical history reviewed : Patient last EGD in October 2025.  CTA negative.  Bilateral DVT ultrasound negative.    Allergies: Amoxicillin, Aspirin, Codeine, and Penicillins    Review of Systems  Gastrointestinal:  Positive for abdominal pain.    Updated Vital Signs BP 135/87   Pulse (!) 117   Temp 98.3 F (36.8 C) (Oral)   Resp (!) 21   Wt 60.3 kg   LMP  (LMP Unknown) Comment: a few months  SpO2 99%   BMI 21.47 kg/m   Physical Exam Vitals and nursing note reviewed.  Constitutional:      General: She is not in acute distress.    Appearance: She is well-developed.  HENT:     Head: Normocephalic and atraumatic.  Eyes:     Conjunctiva/sclera: Conjunctivae normal.  Cardiovascular:     Rate and Rhythm: Normal rate and regular rhythm.     Heart sounds: No murmur heard. Pulmonary:     Effort: Pulmonary effort is normal. No respiratory distress.     Breath sounds: Normal breath sounds.  Abdominal:     Palpations: Abdomen is soft.     Tenderness: There is abdominal tenderness.  Musculoskeletal:        General: No swelling.     Cervical  back: Neck supple.  Skin:    General: Skin is warm and dry.     Capillary Refill: Capillary refill takes less than 2 seconds.  Neurological:     Mental Status: She is alert.  Psychiatric:        Mood and Affect: Mood normal.     (all labs ordered are listed, but only abnormal results are displayed) Labs Reviewed  COMPREHENSIVE METABOLIC PANEL WITH GFR - Abnormal; Notable for the following components:      Result Value   Sodium 133 (*)    Chloride 95 (*)    CO2 18 (*)    Glucose, Bld 477 (*)    BUN 24 (*)    Creatinine, Ser 1.23 (*)    AST 52 (*)    ALT 45 (*)    Total Bilirubin 1.3 (*)    GFR, Estimated 51 (*)    Anion gap 20 (*)    All other components within normal limits  CBC WITH DIFFERENTIAL/PLATELET - Abnormal; Notable for the following components:   RBC 5.68 (*)    Hemoglobin 17.8 (*)    HCT 54.1 (*)    Neutro Abs 9.0 (*)    Lymphs Abs 0.4 (*)    All other components within normal limits  BETA-HYDROXYBUTYRIC ACID -  Abnormal; Notable for the following components:   Beta-Hydroxybutyric Acid 1.50 (*)    All other components within normal limits  BASIC METABOLIC PANEL WITH GFR - Abnormal; Notable for the following components:   Sodium 132 (*)    Chloride 94 (*)    CO2 17 (*)    Glucose, Bld 553 (*)    BUN 27 (*)    Creatinine, Ser 1.32 (*)    Calcium  8.8 (*)    GFR, Estimated 47 (*)    Anion gap 21 (*)    All other components within normal limits  BETA-HYDROXYBUTYRIC ACID - Abnormal; Notable for the following components:   Beta-Hydroxybutyric Acid 1.95 (*)    All other components within normal limits  CBG MONITORING, ED - Abnormal; Notable for the following components:   Glucose-Capillary 433 (*)    All other components within normal limits  CBG MONITORING, ED - Abnormal; Notable for the following components:   Glucose-Capillary 539 (*)    All other components within normal limits  I-STAT VENOUS BLOOD GAS, ED - Abnormal; Notable for the following  components:   pCO2, Ven 42.0 (*)    Acid-base deficit 3.0 (*)    Sodium 134 (*)    Calcium , Ion 1.04 (*)    HCT 55.0 (*)    Hemoglobin 18.7 (*)    All other components within normal limits  I-STAT CHEM 8, ED - Abnormal; Notable for the following components:   Sodium 134 (*)    BUN 29 (*)    Glucose, Bld 467 (*)    Calcium , Ion 0.99 (*)    Hemoglobin 19.0 (*)    HCT 56.0 (*)    All other components within normal limits  CBG MONITORING, ED - Abnormal; Notable for the following components:   Glucose-Capillary 456 (*)    All other components within normal limits  CBG MONITORING, ED - Abnormal; Notable for the following components:   Glucose-Capillary 457 (*)    All other components within normal limits  CBG MONITORING, ED - Abnormal; Notable for the following components:   Glucose-Capillary 402 (*)    All other components within normal limits  CBG MONITORING, ED - Abnormal; Notable for the following components:   Glucose-Capillary 380 (*)    All other components within normal limits  CULTURE, BLOOD (ROUTINE X 2)  CULTURE, BLOOD (ROUTINE X 2)  LIPASE, BLOOD  URINALYSIS, ROUTINE W REFLEX MICROSCOPIC  BASIC METABOLIC PANEL WITH GFR  BASIC METABOLIC PANEL WITH GFR  BASIC METABOLIC PANEL WITH GFR  BETA-HYDROXYBUTYRIC ACID  BETA-HYDROXYBUTYRIC ACID  BETA-HYDROXYBUTYRIC ACID  I-STAT CG4 LACTIC ACID, ED  TROPONIN I (HIGH SENSITIVITY)  TROPONIN I (HIGH SENSITIVITY)    EKG: EKG Interpretation Date/Time:  Thursday November 28 2023 18:14:33 EST Ventricular Rate:  117 PR Interval:  120 QRS Duration:  96 QT Interval:  344 QTC Calculation: 479 R Axis:   -78  Text Interpretation: Sinus tachycardia with occasional Premature ventricular complexes Right atrial enlargement Left axis deviation Incomplete right bundle branch block Possible Lateral infarct , age undetermined Inferior infarct , age undetermined ST & T wave abnormality, consider anterior ischemia Abnormal ECG When compared  with ECG of 17-Oct-2023 00:31, PREVIOUS ECG IS PRESENT Confirmed by Simon Rea [phone]) on 11/28/2023 8:14:10 PM  Radiology: CT ABDOMEN PELVIS W CONTRAST Result Date: 11/28/2023 EXAM: CTA CHEST PE WITH AND WITHOUT CONTRAST CT ABDOMEN AND PELVIS WITH AND WITHOUT CONTRAST 11/28/2023 09:45:00 PM TECHNIQUE: CTA of the chest was performed after the administration of 75  mL of iohexol  (OMNIPAQUE ) 350 MG/ML injection. Multiplanar reformatted images are provided for review. MIP images are provided for review. CT of the abdomen and pelvis was performed with and without the administration of intravenous contrast. Automated exposure control, iterative reconstruction, and/or weight based adjustment of the mA/kV was utilized to reduce the radiation dose to as low as reasonably achievable. COMPARISON: None available. CLINICAL HISTORY: Known history of PE. Tachycardia with chest pain. FINDINGS: CHEST: PULMONARY ARTERIES: Pulmonary arteries are adequately opacified for evaluation. No intraluminal filling defect to suggest pulmonary embolism. Main pulmonary artery is normal in caliber. MEDIASTINUM: Mild coronary artery calcification. Global cardiac size within normal limits. No pericardial effusion. No mediastinal lymphadenopathy. Mild atherosclerotic calcification within the thoracic aorta. No aortic aneurysm. LUNGS AND PLEURA: Moderate emphysema. No focal consolidation or pulmonary edema. No pleural effusion or pneumothorax. SOFT TISSUES AND BONES: Osseous structures are age appropriate. No acute bone abnormality. No lytic or blastic bone lesion. No acute soft tissue abnormality. 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 No enhancing intrahepatic mass. GALLBLADDER AND BILE DUCTS: Gallbladder is unremarkable. No biliary ductal dilatation. SPLEEN: Spleen demonstrates no acute abnormality. PANCREAS: Pancreas demonstrates no acute  abnormality. ADRENAL GLANDS: Heterogeneously enhancing mixed attenuation mass within the left adrenal gland is again identified in keeping with a slow growing adrenal adenoma that are characterized on multiple prior examinations demonstrating interval growth since remote prior examination is now measuring 5.9 cm in greatest dimension (previously measuring 4.3 cm in 2014). Right adrenal gland is unremarkable. KIDNEYS, URETERS AND BLADDER: No stones in the kidneys or ureters. No hydronephrosis. No perinephric or periureteral stranding. Urinary bladder is unremarkable. GI AND BOWEL: There is an internal hernia present with severe extrinsic compression of several large jejunal veins and marked mass effect on the superior mesenteric vein at the point of entry within the hernia which is essentially at the mesenteric root. 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 The herniated bowel involves almost the entire length of small bowel which is markedly dilated and fluid filled. There is, additionally, evidence of mesenteric ischemia involving at least 1 loop of bowel within the left upper quadrant best seen on coronal image 62 and axial image 36. 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 The stomach is fluid-filled and distended with fluid within the distal esophagus in keeping with changes of gastroesophageal reflux. Mild sigmoid diverticulosis. The large bowel is otherwise unremarkable. Appendix normal. REPRODUCTIVE: Reproductive organs are unremarkable. PERITONEUM AND RETROPERITONEUM: Mild ascites. No gross free intraperitoneal gas. LYMPH NODES: No lymphadenopathy. BONES AND SOFT TISSUES: Osseous structures are age appropriate. No acute bone abnormality. No lytic or blastic bone lesion. No focal soft tissue abnormality. Mild aortoiliac atherosclerotic calcification. No aortic aneurysm. IMPRESSION: 1. No  pulmonary embolism. 2. 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 While a single loop of bowel appears infarcted, the vast majority of small bowel appears viable at this point. As noted above, the herniated bowel involves nearly the entire length of small bowel. Emergent surgical evaluation recommended. 3. Moderate pulmonary emphysema. Given emphysema as an independent lung cancer risk factor, consider evaluation for low-dose CT lung cancer screening if the  patient is 58 years old and meets eligibility criteria. 4. Enlarging left adrenal mass now 5.9 cm, previously 4.3 cm in 2014. Recommend surgical consultation and biochemical evaluation for a functioning adrenal lesion (e.g., pheochromocytoma) prior to considering resection. - the critical findings of this study were discussed directly with Dr. Simon by myself at 10:35 pm. Electronically signed by: Dorethia Molt MD 11/28/2023 10:38 PM EST RP Workstation: [id number]   CT Angio Chest PE W and/or Wo Contrast Result Date: 11/28/2023 EXAM: CTA CHEST PE WITH AND WITHOUT CONTRAST CT ABDOMEN AND PELVIS WITH AND WITHOUT CONTRAST 11/28/2023 09:45:00 PM TECHNIQUE: CTA of the chest was performed after the administration of 75 mL of iohexol  (OMNIPAQUE ) 350 MG/ML injection. Multiplanar reformatted images are provided for review. MIP images are provided for review. CT of the abdomen and pelvis was performed with and without the administration of intravenous contrast. Automated exposure control, iterative reconstruction, and/or weight based adjustment of the mA/kV was utilized to reduce the radiation dose to as low as reasonably achievable. COMPARISON: None available. CLINICAL HISTORY: Known history of PE. Tachycardia with chest  pain. FINDINGS: CHEST: PULMONARY ARTERIES: Pulmonary arteries are adequately opacified for evaluation. No intraluminal filling defect to suggest pulmonary embolism. Main pulmonary artery is normal in caliber. MEDIASTINUM: Mild coronary artery calcification. Global cardiac size within normal limits. No pericardial effusion. No mediastinal lymphadenopathy. Mild atherosclerotic calcification within the thoracic aorta. No aortic aneurysm. LUNGS AND PLEURA: Moderate emphysema. No focal consolidation or pulmonary edema. No pleural effusion or pneumothorax. SOFT TISSUES AND BONES: Osseous structures are age appropriate. No acute bone abnormality. No lytic or blastic bone lesion. No acute soft tissue abnormality. 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 No enhancing intrahepatic mass. GALLBLADDER AND BILE DUCTS: Gallbladder is unremarkable. No biliary ductal dilatation. SPLEEN: Spleen demonstrates no acute abnormality. PANCREAS: Pancreas demonstrates no acute abnormality. ADRENAL GLANDS: Heterogeneously enhancing mixed attenuation mass within the left adrenal gland is again identified in keeping with a slow growing adrenal adenoma that are characterized on multiple prior examinations demonstrating interval growth since remote prior examination is now measuring 5.9 cm in greatest dimension (previously measuring 4.3 cm in 2014). Right adrenal gland is unremarkable. KIDNEYS, URETERS AND BLADDER: No stones in the kidneys or ureters. No hydronephrosis. No perinephric or periureteral stranding. Urinary bladder is unremarkable. GI AND BOWEL: There is an internal hernia present with severe extrinsic compression of several large jejunal veins and marked mass effect on the superior mesenteric vein at the point of entry within the hernia which is essentially at the mesenteric root. There is resultant mesenteric edema secondary to  mesenteric venous obstruction. The herniated bowel involves almost the entire length of small bowel which is markedly dilated and fluid filled. There is, additionally, evidence of mesenteric ischemia involving at least 1 loop of bowel within the left upper quadrant best seen on coronal image 62 and axial image 36. 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 The stomach is fluid-filled and distended with fluid within the distal esophagus in keeping with changes of gastroesophageal reflux. Mild sigmoid diverticulosis. The large bowel is otherwise unremarkable. Appendix normal. REPRODUCTIVE: Reproductive organs are unremarkable. PERITONEUM AND RETROPERITONEUM: Mild ascites. No gross free intraperitoneal gas. LYMPH NODES: No lymphadenopathy. BONES AND SOFT TISSUES: Osseous structures are age appropriate. No acute bone abnormality. No lytic or blastic bone lesion. No focal soft tissue abnormality.  Mild aortoiliac atherosclerotic calcification. No aortic aneurysm. IMPRESSION: 1. No pulmonary embolism. 2. 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 While a single loop of bowel appears infarcted, the vast majority of small bowel appears viable at this point. As noted above, the herniated bowel involves nearly the entire length of small bowel. Emergent surgical evaluation recommended. 3. Moderate pulmonary emphysema. Given emphysema as an independent lung cancer risk factor, consider evaluation for low-dose CT lung cancer screening if the patient is 59 years old and meets eligibility criteria. 4. Enlarging left adrenal mass now 5.9 cm, previously 4.3 cm in 2014. Recommend surgical consultation and  biochemical evaluation for a functioning adrenal lesion (e.g., pheochromocytoma) prior to considering resection. - the critical findings of this study were discussed directly with Dr. Simon by myself at 10:35 pm. Electronically signed by: Dorethia Molt MD 11/28/2023 10:38 PM EST RP Workstation: [id number]     Procedures   Medications Ordered in the ED  insulin  regular, human (MYXREDLIN ) 100 units/ 100 mL infusion (9 Units/hr Intravenous Rate/Dose Change 11/28/23 2308)  lactated ringers  infusion ( Intravenous New Bag/Given 11/28/23 2041)  dextrose  5 % in lactated ringers  infusion (0 mLs Intravenous Hold 11/28/23 2045)  dextrose  50 % solution 0-50 mL (has no administration in time range)  cefTRIAXone  (ROCEPHIN ) 2 g in sodium chloride  0.9 % 100 mL IVPB (has no administration in time range)  metroNIDAZOLE  (FLAGYL ) IVPB 500 mg (has no administration in time range)  lactated ringers  bolus 1,206 mL (1,206 mLs Intravenous New Bag/Given 11/28/23 2101)  potassium chloride  10 mEq in 100 mL IVPB (10 mEq Intravenous New Bag/Given 11/28/23 2219)  iohexol  (OMNIPAQUE ) 350 MG/ML injection 75 mL (75 mLs Intravenous Contrast Given 11/28/23 2146)  ondansetron  (ZOFRAN ) injection 4 mg (4 mg Intravenous Given 11/28/23 2159)  HYDROmorphone  (DILAUDID ) injection 1 mg (1 mg Intravenous Given 11/28/23 2159)    Clinical Course as of 11/28/23 2322  Thu Nov 28, 2023  2231 Radiology: Bowel ischemia. Internal hernia. Right at mesenteric root. All of small bowel is in internal hernia. Portal venous gas. Majority of bowel still viable.  [TL]    Clinical Course User Index [TL] Simon Lavonia SAILOR, MD                                 Medical Decision Making Amount and/or Complexity of Data Reviewed Labs: ordered. Radiology: ordered.  Risk Prescription drug management.     HPI:    Presents with abdominal pain.  Nausea and vomiting.  Lower abdominal pain.  Progressed over the past couple days.  Still passing  flatulence.  Last bowel movement was yesterday morning.  Patient Dors is some chest pain as well.  She states it feels more GERD related.  Denies any recent abdominal surgeries.  No fever no chills.  No sick contacts.  No dysuria.  Patient states that she has not been compliant with insulin .  Patient states that she lost her insurance and subsequently regained insurance but since not been able to fill her insulin .  No headache.  No pleuritic chest pain or hemoptysis.  Previous medical history reviewed : Patient last EGD in October 2025.  CTA negative.  Bilateral DVT ultrasound negative.    MDM:   Upon exam, patient  able to tell me her name, place but not time.  Patient has a history of CVA Persaud appears to be at patient baseline without Account or new focal deficits.  Patient tachycardic.  Slightly hypertensive but otherwise O2 saturation are percent on room air.  Patient does have a history of PE. .  Given chest pain, will obtain CT of the chest to rule out PE.  Patient has lower abdominal pain with some rebound or guarding.  Will obtain CT scan of the abdomen.  Rule out diverticulitis.  Rule out obvious ileus or obstruction as well.  Rule out appendicitis.  Patient labs show elevated anion gap, elevated glucose to 477.  Acidotic with bicarb of 18.  Elevated beta hydroxybutyrate.  Type 2 diabetes.  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  Therefore, we will start patient on DKA protocol given that her glucose is only 477.  Reevaluation:   Upon reexamination, patient hemodynamically stable.  Remains A&O x 3 with GCS 15.  CT scan shows internal hernia with small bowel obstruction with possible developing bowel ischemia.  Started patient on ceftriaxone  and Flagyl .  Allergy to penicillins so cannot start patient on Zosyn.  Immediately consulted general surgery.  Dr. Sebastian will come see the patient.  Ongoing fluid resuscitation as well as treatment of patient's DKA.  Fluid resuscitation includes  1200 cc of lactated ringer  followed by lactated ringer  infusion at 125 cc/h   Patient taken directly to the OR.  Interventions: insulin  drip, 1200 cc LR, 125 cc/hr LR.   EKG Interpreted by Me: sinus    Cardiac Tele Interpreted by Me: sinus    I have independently interpreted the CT  images and agree with the radiologist finding   Social Determinant of Health: denies drugs/alcohol    Disposition and Follow Up: admit     CRITICAL CARE Performed by: Lavonia LOISE Pat   Total critical care time: 75 minutes  Critical care time was exclusive of separately billable procedures and treating other patients.  Critical care was necessary to treat or prevent imminent or life-threatening deterioration.  Critical care was time spent personally by me on the following activities: development of treatment plan with patient and/or surrogate as well as nursing, discussions with consultants, evaluation of patient's response to treatment, examination of patient, obtaining history from patient or surrogate, ordering and performing treatments and interventions, ordering and review of laboratory studies, ordering and review of radiographic studies, pulse oximetry and re-evaluation of patient's condition.      Final diagnoses:  Small bowel obstruction (HCC)  Diabetic ketoacidosis without coma associated with type 2 diabetes mellitus [facility])    ED Discharge Orders     None          Pat Lavonia LOISE, MD 11/28/23 2322

## 2023-11-28 NOTE — ED Notes (Signed)
 Pharmacy at bedside for infiltration of Ivs.

## 2023-11-28 NOTE — ED Notes (Signed)
 Surgeon at bedside.

## 2023-11-28 NOTE — Progress Notes (Signed)
 VAST consult. Arrived to room. RN at bedside placing PIV. Powell Bowler, RN VAST

## 2023-11-28 NOTE — Anesthesia Preprocedure Evaluation (Signed)
 Anesthesia Evaluation  Patient identified by MRN, date of birth, ID band Patient awake    Reviewed: Allergy & Precautions, NPO status , Patient's Chart, lab work & pertinent test resultsPreop documentation limited or incomplete due to emergent nature of procedure.  Airway Mallampati: II  TM Distance: >3 FB Neck ROM: Full    Dental  (+) Poor Dentition, Loose   Pulmonary Current Smoker, PE   Pulmonary exam normal        Cardiovascular hypertension, + DVT  Normal cardiovascular exam     Neuro/Psych  PSYCHIATRIC DISORDERS Anxiety Depression    TIACVA    GI/Hepatic negative GI ROS, Neg liver ROS,,,  Endo/Other  diabetes    Renal/GU Renal disease     Musculoskeletal negative musculoskeletal ROS (+)    Abdominal   Peds  Hematology negative hematology ROS (+)   Anesthesia Other Findings small bowel obstruction dead bowel  Reproductive/Obstetrics                              Anesthesia Physical Anesthesia Plan  ASA: 4 and emergent  Anesthesia Plan: General   Post-op Pain Management:    Induction: Intravenous and Rapid sequence  PONV Risk Score and Plan: 2 and Ondansetron , Dexamethasone , Midazolam  and Treatment may vary due to age or medical condition  Airway Management Planned: Oral ETT  Additional Equipment: Arterial line, Ultrasound Guidance Line Placement and CVP  Intra-op Plan:   Post-operative Plan: Post-operative intubation/ventilation  Informed Consent: I have reviewed the patients History and Physical, chart, labs and discussed the procedure including the risks, benefits and alternatives for the proposed anesthesia with the patient or authorized representative who has indicated his/her understanding and acceptance.     Dental advisory given  Plan Discussed with: CRNA  Anesthesia Plan Comments:         Anesthesia Quick Evaluation

## 2023-11-29 ENCOUNTER — Inpatient Hospital Stay (HOSPITAL_COMMUNITY): Admitting: Certified Registered Nurse Anesthetist

## 2023-11-29 ENCOUNTER — Encounter (HOSPITAL_COMMUNITY): Payer: Self-pay | Admitting: General Surgery

## 2023-11-29 ENCOUNTER — Inpatient Hospital Stay (HOSPITAL_COMMUNITY)

## 2023-11-29 DIAGNOSIS — A419 Sepsis, unspecified organism: Secondary | ICD-10-CM | POA: Diagnosis not present

## 2023-11-29 DIAGNOSIS — K56609 Unspecified intestinal obstruction, unspecified as to partial versus complete obstruction: Secondary | ICD-10-CM

## 2023-11-29 DIAGNOSIS — J9601 Acute respiratory failure with hypoxia: Secondary | ICD-10-CM | POA: Diagnosis not present

## 2023-11-29 DIAGNOSIS — I10 Essential (primary) hypertension: Secondary | ICD-10-CM | POA: Diagnosis not present

## 2023-11-29 DIAGNOSIS — E876 Hypokalemia: Secondary | ICD-10-CM

## 2023-11-29 DIAGNOSIS — F17210 Nicotine dependence, cigarettes, uncomplicated: Secondary | ICD-10-CM

## 2023-11-29 DIAGNOSIS — R579 Shock, unspecified: Secondary | ICD-10-CM

## 2023-11-29 DIAGNOSIS — Z4682 Encounter for fitting and adjustment of non-vascular catheter: Secondary | ICD-10-CM | POA: Diagnosis not present

## 2023-11-29 DIAGNOSIS — E1165 Type 2 diabetes mellitus with hyperglycemia: Secondary | ICD-10-CM

## 2023-11-29 DIAGNOSIS — K55022 Diffuse acute infarction of small intestine: Secondary | ICD-10-CM | POA: Diagnosis not present

## 2023-11-29 DIAGNOSIS — K559 Vascular disorder of intestine, unspecified: Secondary | ICD-10-CM | POA: Diagnosis not present

## 2023-11-29 DIAGNOSIS — F418 Other specified anxiety disorders: Secondary | ICD-10-CM | POA: Diagnosis not present

## 2023-11-29 DIAGNOSIS — R6521 Severe sepsis with septic shock: Secondary | ICD-10-CM | POA: Diagnosis not present

## 2023-11-29 DIAGNOSIS — K5730 Diverticulosis of large intestine without perforation or abscess without bleeding: Secondary | ICD-10-CM | POA: Diagnosis not present

## 2023-11-29 DIAGNOSIS — K658 Other peritonitis: Secondary | ICD-10-CM | POA: Diagnosis not present

## 2023-11-29 LAB — BASIC METABOLIC PANEL WITH GFR
Anion gap: 13 (ref 5–15)
Anion gap: 12 (ref 5–15)
Anion gap: 11 (ref 5–15)
BUN: 21 mg/dL — ABNORMAL HIGH (ref 6–20)
BUN: 22 mg/dL — ABNORMAL HIGH (ref 6–20)
BUN: 23 mg/dL — ABNORMAL HIGH (ref 6–20)
CO2: 21 mmol/L — ABNORMAL LOW (ref 22–32)
CO2: 23 mmol/L (ref 22–32)
CO2: 22 mmol/L (ref 22–32)
Calcium: 7.9 mg/dL — ABNORMAL LOW (ref 8.9–10.3)
Calcium: 7.8 mg/dL — ABNORMAL LOW (ref 8.9–10.3)
Calcium: 8 mg/dL — ABNORMAL LOW (ref 8.9–10.3)
Chloride: 104 mmol/L (ref 98–111)
Chloride: 99 mmol/L (ref 98–111)
Chloride: 105 mmol/L (ref 98–111)
Creatinine, Ser: 0.73 mg/dL (ref 0.44–1.00)
Creatinine, Ser: 0.77 mg/dL (ref 0.44–1.00)
Creatinine, Ser: 0.89 mg/dL (ref 0.44–1.00)
GFR, Estimated: >60 mL/min (ref >60)
GFR, Estimated: >60 mL/min (ref >60)
GFR, Estimated: >60 mL/min (ref >60)
Glucose, Bld: 147 mg/dL — ABNORMAL HIGH (ref 70–99)
Glucose, Bld: 162 mg/dL — ABNORMAL HIGH (ref 70–99)
Glucose, Bld: 147 mg/dL — ABNORMAL HIGH (ref 70–99)
Potassium: 3.2 mmol/L — ABNORMAL LOW (ref 3.5–5.1)
Potassium: 4.2 mmol/L (ref 3.5–5.1)
Potassium: 4.4 mmol/L (ref 3.5–5.1)
Sodium: 138 mmol/L (ref 135–145)
Sodium: 134 mmol/L — ABNORMAL LOW (ref 135–145)
Sodium: 138 mmol/L (ref 135–145)

## 2023-11-29 LAB — GLUCOSE, CAPILLARY
Glucose-Capillary: 104 mg/dL — ABNORMAL HIGH (ref 70–99)
Glucose-Capillary: 119 mg/dL — ABNORMAL HIGH (ref 70–99)
Glucose-Capillary: 128 mg/dL — ABNORMAL HIGH (ref 70–99)
Glucose-Capillary: 128 mg/dL — ABNORMAL HIGH (ref 70–99)
Glucose-Capillary: 144 mg/dL — ABNORMAL HIGH (ref 70–99)
Glucose-Capillary: 148 mg/dL — ABNORMAL HIGH (ref 70–99)
Glucose-Capillary: 148 mg/dL — ABNORMAL HIGH (ref 70–99)
Glucose-Capillary: 151 mg/dL — ABNORMAL HIGH (ref 70–99)
Glucose-Capillary: 155 mg/dL — ABNORMAL HIGH (ref 70–99)
Glucose-Capillary: 156 mg/dL — ABNORMAL HIGH (ref 70–99)
Glucose-Capillary: 159 mg/dL — ABNORMAL HIGH (ref 70–99)
Glucose-Capillary: 161 mg/dL — ABNORMAL HIGH (ref 70–99)
Glucose-Capillary: 164 mg/dL — ABNORMAL HIGH (ref 70–99)
Glucose-Capillary: 170 mg/dL — ABNORMAL HIGH (ref 70–99)
Glucose-Capillary: 174 mg/dL — ABNORMAL HIGH (ref 70–99)
Glucose-Capillary: 174 mg/dL — ABNORMAL HIGH (ref 70–99)
Glucose-Capillary: 179 mg/dL — ABNORMAL HIGH (ref 70–99)
Glucose-Capillary: 187 mg/dL — ABNORMAL HIGH (ref 70–99)
Glucose-Capillary: 205 mg/dL — ABNORMAL HIGH (ref 70–99)
Glucose-Capillary: 208 mg/dL — ABNORMAL HIGH (ref 70–99)
Glucose-Capillary: 263 mg/dL — ABNORMAL HIGH (ref 70–99)

## 2023-11-29 LAB — URINALYSIS, ROUTINE W REFLEX MICROSCOPIC
Bilirubin Urine: NEGATIVE
Glucose, UA: 150 mg/dL — AB
Hgb urine dipstick: NEGATIVE
Ketones, ur: NEGATIVE mg/dL
Nitrite: NEGATIVE
Protein, ur: 30 mg/dL — AB
Specific Gravity, Urine: >1.046 — ABNORMAL HIGH (ref 1.005–1.030)
pH: 5 (ref 5.0–8.0)

## 2023-11-29 LAB — CBC
HCT: 44.5 % (ref 36.0–46.0)
Hemoglobin: 15.1 g/dL — ABNORMAL HIGH (ref 12.0–15.0)
MCH: 32 pg (ref 26.0–34.0)
MCHC: 33.9 g/dL (ref 30.0–36.0)
MCV: 94.3 fL (ref 80.0–100.0)
Platelets: 162 K/uL (ref 150–400)
RBC: 4.72 MIL/uL (ref 3.87–5.11)
RDW: 13.1 % (ref 11.5–15.5)
WBC: 9.3 K/uL (ref 4.0–10.5)
nRBC: 0 % (ref 0.0–0.2)

## 2023-11-29 LAB — POCT I-STAT 7, (LYTES, BLD GAS, ICA,H+H)
Acid-base deficit: 4 mmol/L — ABNORMAL HIGH (ref 0.0–2.0)
Acid-base deficit: 1 mmol/L (ref 0.0–2.0)
Bicarbonate: 22.7 mmol/L (ref 20.0–28.0)
Bicarbonate: 24.3 mmol/L (ref 20.0–28.0)
Calcium, Ion: 1.17 mmol/L (ref 1.15–1.40)
Calcium, Ion: 1.13 mmol/L — ABNORMAL LOW (ref 1.15–1.40)
HCT: 43 % (ref 36.0–46.0)
HCT: 43 % (ref 36.0–46.0)
Hemoglobin: 14.6 g/dL (ref 12.0–15.0)
Hemoglobin: 14.6 g/dL (ref 12.0–15.0)
O2 Saturation: 100 %
O2 Saturation: 100 %
Patient temperature: 36.4
Patient temperature: 97.2
Potassium: 3.3 mmol/L — ABNORMAL LOW (ref 3.5–5.1)
Potassium: 3.7 mmol/L (ref 3.5–5.1)
Sodium: 138 mmol/L (ref 135–145)
Sodium: 138 mmol/L (ref 135–145)
TCO2: 24 mmol/L (ref 22–32)
TCO2: 26 mmol/L (ref 22–32)
pCO2 arterial: 44 mmHg (ref 32–48)
pCO2 arterial: 40.8 mmHg (ref 32–48)
pH, Arterial: 7.318 — ABNORMAL LOW (ref 7.35–7.45)
pH, Arterial: 7.379 (ref 7.35–7.45)
pO2, Arterial: 317 mmHg — ABNORMAL HIGH (ref 83–108)
pO2, Arterial: 253 mmHg — ABNORMAL HIGH (ref 83–108)

## 2023-11-29 LAB — BETA-HYDROXYBUTYRIC ACID
Beta-Hydroxybutyric Acid: 0.57 mmol/L — ABNORMAL HIGH (ref 0.05–0.27)
Beta-Hydroxybutyric Acid: <0.05 mmol/L — ABNORMAL LOW (ref 0.05–0.27)
Beta-Hydroxybutyric Acid: 1.09 mmol/L — ABNORMAL HIGH (ref 0.05–0.27)

## 2023-11-29 LAB — MAGNESIUM: Magnesium: 1.6 mg/dL — ABNORMAL LOW (ref 1.7–2.4)

## 2023-11-29 LAB — POCT I-STAT, CHEM 8
BUN: 25 mg/dL — ABNORMAL HIGH (ref 6–20)
Calcium, Ion: 1.22 mmol/L (ref 1.15–1.40)
Chloride: 102 mmol/L (ref 98–111)
Creatinine, Ser: 0.8 mg/dL (ref 0.44–1.00)
Glucose, Bld: 180 mg/dL — ABNORMAL HIGH (ref 70–99)
HCT: 46 % (ref 36.0–46.0)
Hemoglobin: 15.6 g/dL — ABNORMAL HIGH (ref 12.0–15.0)
Potassium: 3.5 mmol/L (ref 3.5–5.1)
Sodium: 139 mmol/L (ref 135–145)
TCO2: 27 mmol/L (ref 22–32)

## 2023-11-29 LAB — LACTIC ACID, PLASMA
Lactic Acid, Venous: 1.4 mmol/L (ref 0.5–1.9)
Lactic Acid, Venous: 0.9 mmol/L (ref 0.5–1.9)

## 2023-11-29 LAB — PHOSPHORUS: Phosphorus: 2.5 mg/dL (ref 2.5–4.6)

## 2023-11-29 LAB — MRSA NEXT GEN BY PCR, NASAL: MRSA by PCR Next Gen: NOT DETECTED

## 2023-11-29 MED ORDER — CHLORHEXIDINE GLUCONATE CLOTH 2 % EX PADS
6.0000 | MEDICATED_PAD | Freq: Every day | CUTANEOUS | Status: DC
  Administered 2023-11-29 – 2023-11-30 (×2): 6 via TOPICAL

## 2023-11-29 MED ORDER — EPHEDRINE 5 MG/ML INJ
INTRAVENOUS | Status: AC
Start: 2023-11-29 — End: 2023-11-29
  Filled 2023-11-29: qty 5

## 2023-11-29 MED ORDER — KETAMINE HCL 10 MG/ML IJ SOLN
INTRAMUSCULAR | Status: DC | PRN
  Administered 2023-11-29: 20 mg via INTRAVENOUS
  Administered 2023-11-29: 30 mg via INTRAVENOUS

## 2023-11-29 MED ORDER — ORAL CARE MOUTH RINSE: 15.0000 mL | OROMUCOSAL | Status: DC | PRN

## 2023-11-29 MED ORDER — DEXMEDETOMIDINE HCL IN NACL 80 MCG/20ML IV SOLN
INTRAVENOUS | Status: DC | PRN
  Administered 2023-11-29: 12 ug via INTRAVENOUS

## 2023-11-29 MED ORDER — METRONIDAZOLE 500 MG/100ML IV SOLN
500.0000 mg | Freq: Two times a day (BID) | INTRAVENOUS | Status: DC
  Administered 2023-11-29 – 2023-11-30 (×2): 500 mg via INTRAVENOUS
  Filled 2023-11-29 (×2): qty 100

## 2023-11-29 MED ORDER — PHENOL 1.4 % MT LIQD
1.0000 | OROMUCOSAL | Status: DC | PRN
  Filled 2023-11-29: qty 177

## 2023-11-29 MED ORDER — FENTANYL BOLUS VIA INFUSION
25.0000 ug | INTRAVENOUS | Status: DC | PRN
  Administered 2023-11-29: 50 ug via INTRAVENOUS
  Administered 2023-11-29 (×7): 100 ug via INTRAVENOUS

## 2023-11-29 MED ORDER — PANTOPRAZOLE SODIUM 40 MG IV SOLR
40.0000 mg | Freq: Every day | INTRAVENOUS | Status: DC
  Administered 2023-11-29 – 2023-11-30 (×2): 40 mg via INTRAVENOUS
  Filled 2023-11-29 (×2): qty 10

## 2023-11-29 MED ORDER — PHENYLEPHRINE HCL-NACL 20-0.9 MG/250ML-% IV SOLN
INTRAVENOUS | Status: DC | PRN
  Administered 2023-11-29: 40 ug/min via INTRAVENOUS

## 2023-11-29 MED ORDER — PHENYLEPHRINE 80 MCG/ML (10ML) SYRINGE FOR IV PUSH (FOR BLOOD PRESSURE SUPPORT)
PREFILLED_SYRINGE | INTRAVENOUS | Status: AC
  Filled 2023-11-29: qty 10

## 2023-11-29 MED ORDER — LIDOCAINE HCL (CARDIAC) PF 100 MG/5ML IV SOSY
PREFILLED_SYRINGE | INTRAVENOUS | Status: DC | PRN
  Administered 2023-11-29: 60 mg via INTRAVENOUS

## 2023-11-29 MED ORDER — FENTANYL 2500MCG IN NS 250ML (10MCG/ML) PREMIX INFUSION
0.0000 ug/h | INTRAVENOUS | Status: DC
  Administered 2023-11-29: 100 ug/h via INTRAVENOUS
  Filled 2023-11-29: qty 250

## 2023-11-29 MED ORDER — MIDAZOLAM HCL 5 MG/5ML IJ SOLN
INTRAMUSCULAR | Status: DC | PRN
  Administered 2023-11-29: 2 mg via INTRAVENOUS

## 2023-11-29 MED ORDER — FENTANYL CITRATE (PF) 50 MCG/ML IJ SOSY
25.0000 ug | PREFILLED_SYRINGE | INTRAMUSCULAR | Status: DC | PRN
Start: 2023-11-29 — End: 2023-12-02
  Administered 2023-11-29 – 2023-12-01 (×10): 50 ug via INTRAVENOUS
  Administered 2023-12-01: 25 ug via INTRAVENOUS
  Filled 2023-11-29 (×11): qty 1

## 2023-11-29 MED ORDER — PROPOFOL 10 MG/ML IV BOLUS
INTRAVENOUS | Status: DC | PRN
  Administered 2023-11-29: 50 mg via INTRAVENOUS

## 2023-11-29 MED ORDER — PROPOFOL 1000 MG/100ML IV EMUL
0.0000 ug/kg/min | INTRAVENOUS | Status: DC
  Administered 2023-11-29: 50 ug/kg/min via INTRAVENOUS
  Administered 2023-11-29: 40 ug/kg/min via INTRAVENOUS
  Filled 2023-11-29: qty 100

## 2023-11-29 MED ORDER — POTASSIUM CHLORIDE 10 MEQ/100ML IV SOLN
10.0000 meq | INTRAVENOUS | Status: AC
  Administered 2023-11-29 (×5): 10 meq via INTRAVENOUS
  Filled 2023-11-29 (×5): qty 100

## 2023-11-29 MED ORDER — LACTATED RINGERS IV SOLN: INTRAVENOUS | Status: DC | PRN

## 2023-11-29 MED ORDER — ORAL CARE MOUTH RINSE
15.0000 mL | OROMUCOSAL | Status: DC
  Administered 2023-11-29 (×7): 15 mL via OROMUCOSAL

## 2023-11-29 MED ORDER — ALBUMIN HUMAN 5 % IV SOLN: INTRAVENOUS | Status: DC | PRN

## 2023-11-29 MED ORDER — PROPOFOL 500 MG/50ML IV EMUL
INTRAVENOUS | Status: DC | PRN
  Administered 2023-11-29: 50 ug/kg/min via INTRAVENOUS

## 2023-11-29 MED ORDER — PHENYLEPHRINE HCL (PRESSORS) 10 MG/ML IV SOLN
INTRAVENOUS | Status: DC | PRN
  Administered 2023-11-29: 160 ug via INTRAVENOUS

## 2023-11-29 MED ORDER — 0.9 % SODIUM CHLORIDE (POUR BTL) OPTIME
TOPICAL | Status: DC | PRN
  Administered 2023-11-29: 2000 mL

## 2023-11-29 MED ORDER — DOCUSATE SODIUM 50 MG/5ML PO LIQD: 100.0000 mg | Freq: Two times a day (BID) | ORAL | Status: DC

## 2023-11-29 MED ORDER — INSULIN ASPART 100 UNIT/ML IJ SOLN
0.0000 [IU] | INTRAMUSCULAR | Status: DC
  Administered 2023-11-30 (×2): 3 [IU] via SUBCUTANEOUS
  Administered 2023-11-30: 2 [IU] via SUBCUTANEOUS
  Filled 2023-11-29: qty 3
  Filled 2023-11-29 (×2): qty 2

## 2023-11-29 MED ORDER — LACTATED RINGERS IV SOLN
INTRAVENOUS | Status: DC | PRN
Start: 2023-11-29 — End: 2023-11-29

## 2023-11-29 MED ORDER — ROCURONIUM BROMIDE 100 MG/10ML IV SOLN
INTRAVENOUS | Status: DC | PRN
  Administered 2023-11-29: 80 mg via INTRAVENOUS

## 2023-11-29 MED ORDER — SUCCINYLCHOLINE CHLORIDE 200 MG/10ML IV SOSY
PREFILLED_SYRINGE | INTRAVENOUS | Status: DC | PRN
  Administered 2023-11-29: 60 mg via INTRAVENOUS

## 2023-11-29 MED ORDER — POLYETHYLENE GLYCOL 3350 17 G PO PACK: 17.0000 g | PACK | Freq: Every day | ORAL | Status: DC

## 2023-11-29 MED ORDER — FENTANYL CITRATE (PF) 50 MCG/ML IJ SOSY
25.0000 ug | PREFILLED_SYRINGE | Freq: Once | INTRAMUSCULAR | Status: AC
  Administered 2023-11-29: 50 ug via INTRAVENOUS

## 2023-11-29 MED ORDER — FENTANYL CITRATE (PF) 100 MCG/2ML IJ SOLN
INTRAMUSCULAR | Status: DC | PRN
  Administered 2023-11-29: 100 ug via INTRAVENOUS
  Administered 2023-11-29: 150 ug via INTRAVENOUS

## 2023-11-29 MED ORDER — NOREPINEPHRINE 4 MG/250ML-% IV SOLN
0.0000 ug/min | INTRAVENOUS | Status: DC
Start: 2023-11-29 — End: 2023-11-30
  Administered 2023-11-29 – 2023-11-30 (×2): 2 ug/min via INTRAVENOUS
  Filled 2023-11-29 (×2): qty 250

## 2023-11-29 MED ORDER — INSULIN REGULAR(HUMAN) IN NACL 100-0.9 UT/100ML-% IV SOLN
INTRAVENOUS | Status: DC
  Administered 2023-11-29: 2 [IU]/h via INTRAVENOUS

## 2023-11-29 MED ORDER — SODIUM CHLORIDE 0.9 % IV SOLN
2.0000 g | Freq: Three times a day (TID) | INTRAVENOUS | Status: DC
  Administered 2023-11-29 – 2023-11-30 (×4): 2 g via INTRAVENOUS
  Filled 2023-11-29 (×4): qty 12.5

## 2023-11-29 NOTE — Anesthesia Procedure Notes (Signed)
 Central Venous Catheter Insertion Performed by: Patrisha Bernardino SQUIBB, MD, anesthesiologist Start/End11/21/2025 12:30 AM, 11/29/2023 12:45 AM Patient location: OR. Preanesthetic checklist: patient identified, IV checked, site marked, risks and benefits discussed, surgical consent, monitors and equipment checked, pre-op evaluation, timeout performed and anesthesia consent Position: Trendelenburg Hand hygiene performed  and maximum sterile barriers used  Catheter size: 8 Fr Total catheter length 16. Central line was placed.Double lumen Procedure performed using ultrasound to evaluate access site. Ultrasound Notes:relevant anatomy identified, ultrasound used to visualize needle entry, vessel patent under ultrasound and image(s) printed for medical record. Attempts: 1 Following insertion, dressing applied, line sutured and Biopatch. Post procedure assessment: blood return through all ports, free fluid flow and no air  Patient tolerated the procedure well with no immediate complications.

## 2023-11-29 NOTE — Anesthesia Postprocedure Evaluation (Signed)
 Anesthesia Post Note  Patient: [name]  Procedure(s) Performed: LAPAROTOMY, EXPLORATORY EXCISION, SMALL INTESTINE (Abdomen)     Patient location during evaluation: SICU Anesthesia Type: General Level of consciousness: sedated Pain management: pain level controlled Vital Signs Assessment: post-procedure vital signs reviewed and stable Respiratory status: patient remains intubated per anesthesia plan Cardiovascular status: stable Postop Assessment: no apparent nausea or vomiting Anesthetic complications: no   No notable events documented.  Last Vitals:  Vitals:   11/28/23 2200 11/29/23 0200  BP: 135/87   Pulse: (!) 117   Resp: (!) 21   Temp: 36.8 C (!) 36.2 C  SpO2: 99%     Last Pain:  Vitals:   11/29/23 0200  TempSrc: Axillary  PainSc:                  Bernardino SQUIBB Jenni Thew

## 2023-11-29 NOTE — Anesthesia Procedure Notes (Signed)
 Procedure Name: Intubation Date/Time: 11/29/2023 12:16 AM  Performed by: Aavya Shafer T, CRNAPre-anesthesia Checklist: Patient identified, Emergency Drugs available, Suction available and Patient being monitored Patient Re-evaluated:Patient Re-evaluated prior to induction Oxygen Delivery Method: Circle system utilized Preoxygenation: Pre-oxygenation with 100% oxygen Induction Type: IV induction, Rapid sequence and Cricoid Pressure applied Ventilation: Mask ventilation without difficulty Laryngoscope Size: Mac and 4 Grade View: Grade I Tube type: Oral Tube size: 7.0 mm Number of attempts: 1 Airway Equipment and Method: Stylet and Oral airway Placement Confirmation: ETT inserted through vocal cords under direct vision, positive ETCO2 and breath sounds checked- equal and bilateral Secured at: 23 cm Tube secured with: Tape Dental Injury: Teeth and Oropharynx as per pre-operative assessment

## 2023-11-29 NOTE — Progress Notes (Addendum)
 eLink Physician-Brief Progress Note Patient Name: [name] DOB: September 18, 1965 MRN: [id number]   Date of Service  11/29/2023  HPI/Events of Note  58 year old female with a history of CVA, DVTs, PE, and type 2 diabetes mellitus who presented with abdominal pain and distention found to have small bowel obstruction with ischemia status post exploratory laparotomy and small bowel resection.  Patient is tachycardic but otherwise normal vitals saturating 100% mechanically ventilated with 60% FiO2.  Currently on infusion of insulin , phenylephrine , propofol .  Results show metabolic acidosis, hyperglycemia, and imaging reviewed.  eICU Interventions  Continue Foley catheter  Increase sedation to maintain RASS/CPOT scores, fentanyl  infusion  Maintain insulin  drip, switch to hypoglycemia protocol, Anion gap and bicarb within appropriate range.  Serial labs ordered, may be able to switch to sliding scale insulin  pending trend.   Continue dextrose  infusion at 50 cc an hour  Therapeutic antibiotics  Kcl  DVT prophylaxis with SCDs GI prophylaxis with pantoprazole    0406 - Add Norepi infusion  Intervention Category Evaluation Type: New Patient Evaluation  [name] 11/29/2023, 2:33 AM

## 2023-11-29 NOTE — Anesthesia Procedure Notes (Signed)
 Arterial Line Insertion Start/End11/21/2025 12:30 AM, 11/29/2023 12:35 AM Performed by: Belva Koziel T, SCIENTIST, CLINICAL (HISTOCOMPATIBILITY AND IMMUNOGENETICS), CRNA  Preanesthetic checklist: patient identified, IV checked, site marked, risks and benefits discussed, surgical consent, monitors and equipment checked, pre-op evaluation, timeout performed and anesthesia consent Patient sedated Left, radial was placed Catheter size: 20 G Hand hygiene performed  and maximum sterile barriers used   Attempts: 1 Following insertion, dressing applied and Biopatch. Post procedure assessment: normal  Patient tolerated the procedure well with no immediate complications.

## 2023-11-29 NOTE — Progress Notes (Signed)
 General Surgery Follow Up Note  Subjective:    Overnight Issues:   Objective:  Vital signs for last 24 hours: Temp:  [97.1 F (36.2 C)-98.3 F (36.8 C)] 98 F (36.7 C) (11/21 0800) Pulse Rate:  [87-119] 94 (11/21 0747) Resp:  [12-29] 15 (11/21 0900) BP: (84-152)/(57-114) 97/70 (11/21 0900) SpO2:  [95 %-100 %] 100 % (11/21 0747) Arterial Line BP: (79-238)/(36-170) 114/53 (11/21 0900) FiO2 (%):  [40 %-60 %] 40 % (11/21 0747) Weight:  [60.3 kg-60.7 kg] 60.7 kg (11/21 0630)  Hemodynamic parameters for last 24 hours:    Intake/Output from previous day: 11/20 0701 - 11/21 0700 In: 5795.6 [I.V.:4743.3; IV Piggyback:1052.2] Out: 2160 [Urine:310; Blood:50]  Intake/Output this shift: Total I/O In: 304.8 [I.V.:202.7; IV Piggyback:102.2] Out: -   Vent settings for last 24 hours: Vent Mode: PRVC FiO2 (%):  [40 %-60 %] 40 % Set Rate:  [16 bmp] 16 bmp Vt Set:  [470 mL] 470 mL PEEP:  [5 cmH20] 5 cmH20 Plateau Pressure:  [12 cmH20-13 cmH20] 12 cmH20  Physical Exam:  Gen: comfortable, no distress Neuro: sedated on exam HEENT: PERRL Neck: supple CV: RRR Pulm: unlabored breathing on mechanical ventilation Abd: soft, NT, incision clean, dry, intact  GU: urine clear and yellow, +Foley Extr: wwp, no edema  Results for orders placed or performed during the hospital encounter of 11/28/23 (from the past 24 hours)  CBG monitoring, ED     Status: Abnormal   Collection Time: 11/28/23  6:05 PM  Result Value Ref Range   Glucose-Capillary 433 (H) 70 - 99 mg/dL  Comprehensive metabolic panel     Status: Abnormal   Collection Time: 11/28/23  6:10 PM  Result Value Ref Range   Sodium 133 (L) 135 - 145 mmol/L   Potassium 4.6 3.5 - 5.1 mmol/L   Chloride 95 (L) 98 - 111 mmol/L   CO2 18 (L) 22 - 32 mmol/L   Glucose, Bld 477 (H) 70 - 99 mg/dL   BUN 24 (H) 6 - 20 mg/dL   Creatinine, Ser 8.76 (H) 0.44 - 1.00 mg/dL   Calcium  8.9 8.9 - 10.3 mg/dL   Total Protein 6.9 6.5 - 8.1 g/dL    Albumin  3.5 3.5 - 5.0 g/dL   AST 52 (H) 15 - 41 U/L   ALT 45 (H) 0 - 44 U/L   Alkaline Phosphatase 101 38 - 126 U/L   Total Bilirubin 1.3 (H) 0.0 - 1.2 mg/dL   GFR, Estimated 51 (L) >60 mL/min   Anion gap 20 (H) 5 - 15  Lipase, blood     Status: None   Collection Time: 11/28/23  6:10 PM  Result Value Ref Range   Lipase 24 11 - 51 U/L  CBC with Differential     Status: Abnormal   Collection Time: 11/28/23  6:10 PM  Result Value Ref Range   WBC 10.1 4.0 - 10.5 K/uL   RBC 5.68 (H) 3.87 - 5.11 MIL/uL   Hemoglobin 17.8 (H) 12.0 - 15.0 g/dL   HCT 45.8 (H) 63.9 - 53.9 %   MCV 95.2 80.0 - 100.0 fL   MCH 31.3 26.0 - 34.0 pg   MCHC 32.9 30.0 - 36.0 g/dL   RDW 86.9 88.4 - 84.4 %   Platelets 197 150 - 400 K/uL   nRBC 0.0 0.0 - 0.2 %   Neutrophils Relative % 89 %   Neutro Abs 9.0 (H) 1.7 - 7.7 K/uL   Lymphocytes Relative 4 %   Lymphs  Abs 0.4 (L) 0.7 - 4.0 K/uL   Monocytes Relative 7 %   Monocytes Absolute 0.7 0.1 - 1.0 K/uL   Eosinophils Relative 0 %   Eosinophils Absolute 0.0 0.0 - 0.5 K/uL   Basophils Relative 0 %   Basophils Absolute 0.0 0.0 - 0.1 K/uL   Immature Granulocytes 0 %   Abs Immature Granulocytes 0.03 0.00 - 0.07 K/uL  Beta-hydroxybutyric acid     Status: Abnormal   Collection Time: 11/28/23  6:10 PM  Result Value Ref Range   Beta-Hydroxybutyric Acid 1.50 (H) 0.05 - 0.27 mmol/L  I-Stat venous blood gas, ([facility], [facility], [facility])     Status: Abnormal   Collection Time: 11/28/23  6:30 PM  Result Value Ref Range   pH, Ven 7.348 7.25 - 7.43   pCO2, Ven 42.0 (L) 44 - 60 mmHg   pO2, Ven 44 32 - 45 mmHg   Bicarbonate 23.1 20.0 - 28.0 mmol/L   TCO2 24 22 - 32 mmol/L   O2 Saturation 77 %   Acid-base deficit 3.0 (H) 0.0 - 2.0 mmol/L   Sodium 134 (L) 135 - 145 mmol/L   Potassium 4.6 3.5 - 5.1 mmol/L   Calcium , Ion 1.04 (L) 1.15 - 1.40 mmol/L   HCT 55.0 (H) 36.0 - 46.0 %   Hemoglobin 18.7 (H) 12.0 - 15.0 g/dL   Sample type VENOUS   I-stat chem 8, ED (not at [facility], [facility] or [facility])      Status: Abnormal   Collection Time: 11/28/23  6:30 PM  Result Value Ref Range   Sodium 134 (L) 135 - 145 mmol/L   Potassium 4.7 3.5 - 5.1 mmol/L   Chloride 101 98 - 111 mmol/L   BUN 29 (H) 6 - 20 mg/dL   Creatinine, Ser 8.99 0.44 - 1.00 mg/dL   Glucose, Bld 532 (H) 70 - 99 mg/dL   Calcium , Ion 0.99 (L) 1.15 - 1.40 mmol/L   TCO2 23 22 - 32 mmol/L   Hemoglobin 19.0 (H) 12.0 - 15.0 g/dL   HCT 43.9 (H) 63.9 - 53.9 %  Troponin I (High Sensitivity)     Status: None   Collection Time: 11/28/23  8:19 PM  Result Value Ref Range   Troponin I (High Sensitivity) 6 <18 ng/L  Basic metabolic panel     Status: Abnormal   Collection Time: 11/28/23  8:19 PM  Result Value Ref Range   Sodium 132 (L) 135 - 145 mmol/L   Potassium 4.6 3.5 - 5.1 mmol/L   Chloride 94 (L) 98 - 111 mmol/L   CO2 17 (L) 22 - 32 mmol/L   Glucose, Bld 553 (HH) 70 - 99 mg/dL   BUN 27 (H) 6 - 20 mg/dL   Creatinine, Ser 8.67 (H) 0.44 - 1.00 mg/dL   Calcium  8.8 (L) 8.9 - 10.3 mg/dL   GFR, Estimated 47 (L) >60 mL/min   Anion gap 21 (H) 5 - 15  Beta-hydroxybutyric acid     Status: Abnormal   Collection Time: 11/28/23  8:19 PM  Result Value Ref Range   Beta-Hydroxybutyric Acid 1.95 (H) 0.05 - 0.27 mmol/L  CBG monitoring, ED     Status: Abnormal   Collection Time: 11/28/23  8:33 PM  Result Value Ref Range   Glucose-Capillary 539 (HH) 70 - 99 mg/dL   Comment 1 Notify RN   CBG monitoring, ED     Status: Abnormal   Collection Time: 11/28/23  9:17 PM  Result Value Ref Range  Glucose-Capillary 456 (H) 70 - 99 mg/dL  CBG monitoring, ED     Status: Abnormal   Collection Time: 11/28/23  9:56 PM  Result Value Ref Range   Glucose-Capillary 457 (H) 70 - 99 mg/dL  CBG monitoring, ED     Status: Abnormal   Collection Time: 11/28/23 10:31 PM  Result Value Ref Range   Glucose-Capillary 402 (H) 70 - 99 mg/dL  CBG monitoring, ED     Status: Abnormal   Collection Time: 11/28/23 11:07 PM  Result Value Ref Range   Glucose-Capillary 380  (H) 70 - 99 mg/dL   Comment 1 Notify RN    Comment 2 Document in Chart   Glucose, capillary     Status: Abnormal   Collection Time: 11/28/23 11:47 PM  Result Value Ref Range   Glucose-Capillary 263 (H) 70 - 99 mg/dL  I-STAT 7, (LYTES, BLD GAS, ICA, H+H)     Status: Abnormal   Collection Time: 11/29/23  1:02 AM  Result Value Ref Range   pH, Arterial 7.318 (L) 7.35 - 7.45   pCO2 arterial 44.0 32 - 48 mmHg   pO2, Arterial 317 (H) 83 - 108 mmHg   Bicarbonate 22.7 20.0 - 28.0 mmol/L   TCO2 24 22 - 32 mmol/L   O2 Saturation 100 %   Acid-base deficit 4.0 (H) 0.0 - 2.0 mmol/L   Sodium 138 135 - 145 mmol/L   Potassium 3.3 (L) 3.5 - 5.1 mmol/L   Calcium , Ion 1.17 1.15 - 1.40 mmol/L   HCT 43.0 36.0 - 46.0 %   Hemoglobin 14.6 12.0 - 15.0 g/dL   Patient temperature 63.5 C    Sample type ARTERIAL   I-STAT, chem 8     Status: Abnormal   Collection Time: 11/29/23  1:10 AM  Result Value Ref Range   Sodium 139 135 - 145 mmol/L   Potassium 3.5 3.5 - 5.1 mmol/L   Chloride 102 98 - 111 mmol/L   BUN 25 (H) 6 - 20 mg/dL   Creatinine, Ser 9.19 0.44 - 1.00 mg/dL   Glucose, Bld 819 (H) 70 - 99 mg/dL   Calcium , Ion 1.22 1.15 - 1.40 mmol/L   TCO2 27 22 - 32 mmol/L   Hemoglobin 15.6 (H) 12.0 - 15.0 g/dL   HCT 53.9 63.9 - 53.9 %  Glucose, capillary     Status: Abnormal   Collection Time: 11/29/23  2:17 AM  Result Value Ref Range   Glucose-Capillary 148 (H) 70 - 99 mg/dL  MRSA Next Gen by PCR, Nasal     Status: None   Collection Time: 11/29/23  2:18 AM   Specimen: Nasal Mucosa; Nasal Swab  Result Value Ref Range   MRSA by PCR Next Gen NOT DETECTED NOT DETECTED  Urinalysis, Routine w reflex microscopic -Urine, Clean Catch     Status: Abnormal   Collection Time: 11/29/23  3:24 AM  Result Value Ref Range   Color, Urine YELLOW YELLOW   APPearance CLEAR CLEAR   Specific Gravity, Urine >1.046 (H) 1.005 - 1.030   pH 5.0 5.0 - 8.0   Glucose, UA 150 (A) NEGATIVE mg/dL   Hgb urine dipstick NEGATIVE  NEGATIVE   Bilirubin Urine NEGATIVE NEGATIVE   Ketones, ur NEGATIVE NEGATIVE mg/dL   Protein, ur 30 (A) NEGATIVE mg/dL   Nitrite NEGATIVE NEGATIVE   Leukocytes,Ua TRACE (A) NEGATIVE   RBC / HPF 0-5 0 - 5 RBC/hpf   WBC, UA 0-5 0 - 5 WBC/hpf   Bacteria, UA  MANY (A) NONE SEEN   Squamous Epithelial / HPF 0-5 0 - 5 /HPF   Mucus PRESENT   Basic metabolic panel     Status: Abnormal   Collection Time: 11/29/23  3:24 AM  Result Value Ref Range   Sodium 138 135 - 145 mmol/L   Potassium 3.2 (L) 3.5 - 5.1 mmol/L   Chloride 104 98 - 111 mmol/L   CO2 21 (L) 22 - 32 mmol/L   Glucose, Bld 147 (H) 70 - 99 mg/dL   BUN 21 (H) 6 - 20 mg/dL   Creatinine, Ser 9.26 0.44 - 1.00 mg/dL   Calcium  7.9 (L) 8.9 - 10.3 mg/dL   GFR, Estimated >39 >39 mL/min   Anion gap 13 5 - 15  Beta-hydroxybutyric acid     Status: Abnormal   Collection Time: 11/29/23  3:24 AM  Result Value Ref Range   Beta-Hydroxybutyric Acid 0.57 (H) 0.05 - 0.27 mmol/L  Glucose, capillary     Status: Abnormal   Collection Time: 11/29/23  3:42 AM  Result Value Ref Range   Glucose-Capillary 104 (H) 70 - 99 mg/dL  I-STAT 7, (LYTES, BLD GAS, ICA, H+H)     Status: Abnormal   Collection Time: 11/29/23  3:58 AM  Result Value Ref Range   pH, Arterial 7.379 7.35 - 7.45   pCO2 arterial 40.8 32 - 48 mmHg   pO2, Arterial 253 (H) 83 - 108 mmHg   Bicarbonate 24.3 20.0 - 28.0 mmol/L   TCO2 26 22 - 32 mmol/L   O2 Saturation 100 %   Acid-base deficit 1.0 0.0 - 2.0 mmol/L   Sodium 138 135 - 145 mmol/L   Potassium 3.7 3.5 - 5.1 mmol/L   Calcium , Ion 1.13 (L) 1.15 - 1.40 mmol/L   HCT 43.0 36.0 - 46.0 %   Hemoglobin 14.6 12.0 - 15.0 g/dL   Patient temperature 02.7 F    Sample type ARTERIAL   Glucose, capillary     Status: Abnormal   Collection Time: 11/29/23  4:39 AM  Result Value Ref Range   Glucose-Capillary 148 (H) 70 - 99 mg/dL  Blood culture (routine x 2)     Status: None (Preliminary result)   Collection Time: 11/29/23  5:22 AM    Specimen: BLOOD RIGHT HAND  Result Value Ref Range   Specimen Description BLOOD RIGHT HAND    Special Requests      BOTTLES DRAWN AEROBIC ONLY Blood Culture results may not be optimal due to an inadequate volume of blood received in culture bottles   Culture      NO GROWTH < 12 HOURS Performed at [facility], [address], [place], [place] [address]    Report Status PENDING   Glucose, capillary     Status: Abnormal   Collection Time: 11/29/23  5:40 AM  Result Value Ref Range   Glucose-Capillary 174 (H) 70 - 99 mg/dL  Glucose, capillary     Status: Abnormal   Collection Time: 11/29/23  6:51 AM  Result Value Ref Range   Glucose-Capillary 161 (H) 70 - 99 mg/dL  Glucose, capillary     Status: Abnormal   Collection Time: 11/29/23  7:57 AM  Result Value Ref Range   Glucose-Capillary 174 (H) 70 - 99 mg/dL  CBC     Status: Abnormal   Collection Time: 11/29/23  8:11 AM  Result Value Ref Range   WBC 9.3 4.0 - 10.5 K/uL   RBC 4.72 3.87 - 5.11 MIL/uL  Hemoglobin 15.1 (H) 12.0 - 15.0 g/dL   HCT 55.4 63.9 - 53.9 %   MCV 94.3 80.0 - 100.0 fL   MCH 32.0 26.0 - 34.0 pg   MCHC 33.9 30.0 - 36.0 g/dL   RDW 86.8 88.4 - 84.4 %   Platelets 162 150 - 400 K/uL   nRBC 0.0 0.0 - 0.2 %  Glucose, capillary     Status: Abnormal   Collection Time: 11/29/23  9:00 AM  Result Value Ref Range   Glucose-Capillary 144 (H) 70 - 99 mg/dL    Assessment & Plan: The plan of care was discussed with the bedside nurse for the day, who is in agreement with this plan and no additional concerns were raised.   Present on Admission:  Bowel obstruction (HCC)    LOS: 1 day   Additional comments:I reviewed the patient's new clinical lab test results.   and I reviewed the patients new imaging test results.    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 Sebastian. AROBF. To aid in anastomotic healing, would prefer reduction in vasopressor needs, potentially  aided by an alternate sedative than propofol . On an insulin  drip, improved BG control will aid wound healing also.  VDRF - management per CCM Uncontrolled DM - management per CCM, insulin  gtt, BG much improved UA with many bacteria and trace LE - recommend urine culture FEN - strict NPO, NGT for decompression, no TF until ROBF DVT - SCDs, LMWH okay to start from my standpoint ID - okay for periop/24h post-op abx and then d/c  Dispo - ICU    Dreama GEANNIE Hanger, MD Trauma & General Surgery Please use [email or web address] to contact on call provider  11/29/2023  *Care during the described time interval was provided by me. I have reviewed this patient's available data, including medical history, events of note, physical examination and test results as part of my evaluation.

## 2023-11-29 NOTE — Op Note (Signed)
 11/28/2023 - 11/29/2023  1:45 AM  PATIENT:  [name]  58 y.o. female  PRE-OPERATIVE DIAGNOSIS:  small bowel obstruction dead bowel  POST-OPERATIVE DIAGNOSIS:  small bowel obstruction with segment of dead bowel  PROCEDURE:  Procedure(s): LAPAROTOMY, EXPLORATORY SMALL BOWEL RESECTION  SURGEON:  Surgeon(s): Sebastian Moles, MD  ASSISTANTS: none   ANESTHESIA:   general  EBL:  Total I/O In: 2250 [I.V.:2000; IV Piggyback:250] Out: 1800 [Other:1800]  BLOOD ADMINISTERED:none  DRAINS: none   SPECIMEN:  Excision  DISPOSITION OF SPECIMEN:  PATHOLOGY  COUNTS:  NO there was an extra forcep not initially counted. X-ray done neg.  DICTATION: .Nechama Dictation Findings: Single band causing small bowel obstruction and essentially an internal hernia obstructing the distal ileum about 15 cm from the ileocecal valve  Procedure in detail: Informed consent was obtained.  She received intravenous antibiotics.  She was brought to the operating room emergently.  General endotracheal anesthesia was administered by the anesthesia staff after they placed an NG tube and evacuated her stomach.  Foley catheter was placed by nursing.  Her abdomen was prepped and draped in a sterile fashion.  I did midline incision.  Subcutaneous tissues were dissected down revealing the anterior fascia.  I divided this sharply along the midline and carefully entered the peritoneal cavity under direct vision.  The fascia was opened to the length of the incision.  The abdomen was explored.  The small bowel was very dilated and moderately dusky in the proximal segments.  As I traced the bowel distally identified the cause of the obstruction being a single thick band of adhesion across the distal ileum about 15 cm from the ileocecal valve.  It was causing an obstruction and essentially an internal hernia at that location.  I divided the band.  This freed up the bowel and I was able to oriented back anatomically.  The  segment that had been obstructed was dead.  It was not quite perforated.  I divided the ileum distal to the necrotic segment.  I made a small intentional enterotomy in the proximal portion and evacuated some of the bowel contents to decrease the distention using suction.  I then divided the proximal bowel proximal to this area.  I then milked back some of the dilated proximal small bowel into the stomach and that was evacuated by anesthesia.  I reinspected the small bowel running it from the ligament of Treitz down to the stapled off end.  The bowel pinked up and looked viable without any evidence of ongoing ischemia.  I decided it would be safe to make an anastomosis.  I mobilized the terminal ileum a bit and then did a side-to-side anastomosis of the distal ileum with the terminal ileum with a GIA 75 stapler.  Common defect was closed with a TA 60.  Multiple 3-0 silk sutures were placed to reinforce the staple line and closed the mesenteric defect.  Apical sutures of 2-0 silk x 2 were placed.  The anastomosis was widely patent and pink.  There was good hemostasis.  I changed my gloves.  The abdomen was copiously irrigated.  Small bowel was reinspected and all remained viable.  It was placed back in anatomic position and the omentum was brought over the top.  NG tube was confirmed in good position.  The fascia was closed with running #1 looped PDS x 2 the skin was irrigated.  The count was incorrect due to an additional forcep that was not initially counted.  In light  of this, we did an x-ray according to policy to confirm no intra-abdominal foreign bodies.  That was negative.  The skin was closed with staples.  Sterile dressing was applied.  There were no apparent complications.  She was taken directly to the intensive care unit on the ventilator in critical condition. PATIENT DISPOSITION:  ICU, intubated, critical condition   Delay start of Pharmacological VTE agent (>24hrs) due to surgical blood loss or risk  of bleeding:  no  Dann Hummer, MD, MPH, FACS Pager: [phone]  11/21/20251:45 AM

## 2023-11-29 NOTE — Plan of Care (Signed)
  Problem: Education: Goal: Knowledge of General Education information will improve Description: Including pain rating scale, medication(s)/side effects and non-pharmacologic comfort measures Outcome: Progressing   Problem: Clinical Measurements: Goal: Ability to maintain clinical measurements within normal limits will improve Outcome: Progressing Goal: Respiratory complications will improve Outcome: Progressing Goal: Cardiovascular complication will be avoided Outcome: Progressing   Problem: Coping: Goal: Level of anxiety will decrease Outcome: Progressing   Problem: Pain Managment: Goal: General experience of comfort will improve and/or be controlled Outcome: Progressing   Problem: Safety: Goal: Ability to remain free from injury will improve Outcome: Progressing   Problem: Nutrition: Goal: Adequate nutrition will be maintained Outcome: Not Progressing

## 2023-11-29 NOTE — Consult Note (Signed)
 NAME:  [name], MRN:  [id number], DOB:  [birth year], LOS: 1 ADMISSION DATE:  11/28/2023, CONSULTATION DATE:  11/29/23 REFERRING MD:  Dann, CHIEF COMPLAINT:  post operative resp insuff   History of Present Illness:  58 yo female presented to ed today 2/2 abdominal pain, distention and n/v/d.  Symptoms began 5 days ago.  They have not improved. Reportedly there are additional family members whom have had a viral illness the past week as well. Denied f/c no cp/sob/ha/dizziness. Unable to take po. Non adherent to medication regimen.   Pt was found to have internal hernia with bowel ischemia noted. Surgery was called to eval and they are taking pt to OR for resection with anticipation pt will remain intubated post operatively they have requested ccm follow for medical management. Pt has no history of abdominal surgeries.   Pertinent  Medical History  H/o cva with R deficits H/o PE H/o dvt Hypertension H/o tobacco use Anxiety  Depression T2dm with hyperglycemia  Significant Hospital Events: Including procedures, antibiotic start and stop dates in addition to other pertinent events   Taken to OR 11/21 Admitted to ICU post OR 11/21  Interim History / Subjective:    Objective    Blood pressure 135/87, pulse (!) 117, temperature 98.3 F (36.8 C), temperature source Oral, resp. rate (!) 21, weight 60.3 kg, SpO2 99%.        Intake/Output Summary (Last 24 hours) at 11/29/2023 0014 Last data filed at 11/28/2023 1816 Gross per 24 hour  Intake 500 ml  Output --  Net 500 ml   Filed Weights   11/28/23 1822  Weight: 60.3 kg    Examination: General: sedated on vent, arouses and tracks with eyes HENT: ncat, eomi, perrla, mmmp Lungs: ctab Cardiovascular: tachycardic Abdomen: soft, dressing in place with some blood at bottom of dressing. Bs severely diminished, no rebound or guarding illicited Extremities: no c/c/e Neuro: difficult to fully assess of sedation but does awaken  and track with eyes GU: deferred    Resolved problem list   Assessment and Plan  Sepsis 2/2 bowel obstruction with internal hernia and subsequent bowel ischemia Shock 2/2 sepsis Acute post operative hypoxic resp failure req mechanical ventilation Hypokalemia T2dm with hyperglycemia -titrate vasopressor to map >65 -abx ongoing with gram neg and anerobic coverage -f/u cx as well -titrate vent, vap procotol -as pt is able to follow commands and was able to closed in OR so reasonable to work toward that this morning -replace potassium -monitor BS, cont endotool -post operative care per surgery  -npo, wound care, bowel care etc   Labs   CBC: Recent Labs  Lab 11/28/23 1810 11/28/23 1830  WBC 10.1  --   NEUTROABS 9.0*  --   HGB 17.8* 18.7*  19.0*  HCT 54.1* 55.0*  56.0*  MCV 95.2  --   PLT 197  --     Basic Metabolic Panel: Recent Labs  Lab 11/28/23 1810 11/28/23 1830 11/28/23 2019  NA 133* 134*  134* 132*  K 4.6 4.6  4.7 4.6  CL 95* 101 94*  CO2 18*  --  17*  GLUCOSE 477* 467* 553*  BUN 24* 29* 27*  CREATININE 1.23* 1.00 1.32*  CALCIUM  8.9  --  8.8*   GFR: CrCl cannot be calculated (Unknown ideal weight.). Recent Labs  Lab 11/28/23 1810  WBC 10.1    Liver Function Tests: Recent Labs  Lab 11/28/23 1810  AST 52*  ALT 45*  ALKPHOS 101  BILITOT  1.3*  PROT 6.9  ALBUMIN  3.5   Recent Labs  Lab 11/28/23 1810  LIPASE 24   No results for input(s): AMMONIA in the last 168 hours.  ABG    Component Value Date/Time   PHART 7.42 03/24/2021 1046   PCO2ART 26 (L) 03/24/2021 1046   PO2ART 64 (L) 03/24/2021 1046   HCO3 23.1 11/28/2023 1830   TCO2 24 11/28/2023 1830   TCO2 23 11/28/2023 1830   ACIDBASEDEF 3.0 (H) 11/28/2023 1830   O2SAT 77 11/28/2023 1830     Coagulation Profile: No results for input(s): INR, PROTIME in the last 168 hours.  Cardiac Enzymes: No results for input(s): CKTOTAL, CKMB, CKMBINDEX, TROPONINI in the last  168 hours.  HbA1C: Hgb A1c MFr Bld  Date/Time Value Ref Range Status  03/20/2022 06:30 PM 11.4 (H) 4.8 - 5.6 % Final    Comment:    (NOTE)         Prediabetes: 5.7 - 6.4         Diabetes: >6.4         Glycemic control for adults with diabetes: <7.0   03/24/2021 03:04 AM >15.5 (H) 4.8 - 5.6 % Final    Comment:    (NOTE) **Verified by repeat analysis**         Prediabetes: 5.7 - 6.4         Diabetes: >6.4         Glycemic control for adults with diabetes: <7.0   d  CBG: Recent Labs  Lab 11/28/23 2033 11/28/23 2117 11/28/23 2156 11/28/23 2231 11/28/23 2307  GLUCAP 539* 456* 457* 402* 380*    Review of Systems:   Unobtainable 2/2 intubated status  Past Medical History:  She,  has a past medical history of Acid reflux, Adrenal benign tumor, Anxiety, Depression, Diabetes mellitus without complication (HCC), DVT (deep vein thrombosis) in pregnancy, Hypertension, Nausea vomiting and diarrhea (03/24/2021), PE (pulmonary embolism), and Stroke (HCC).   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;  Surgeon: Shari Easter, MD;  Location: [facility] OR;  Service: Orthopedics;  Laterality: Right;  needs 60 min   LOOP RECORDER IMPLANT N/A 03/25/2014   Procedure: LOOP RECORDER IMPLANT;  Surgeon: Elspeth JAYSON Sage, MD;  Location: [facility] CATH LAB;  Service: Cardiovascular;  Laterality: N/A;   NO PAST SURGERIES     TEE WITHOUT CARDIOVERSION N/A 03/25/2014   Procedure: TRANSESOPHAGEAL ECHOCARDIOGRAM (TEE);  Surgeon: Vinie JAYSON Maxcy, MD;  Location: [facility] ENDOSCOPY;  Service: Cardiovascular;  Laterality: N/A;     Social History:   reports that she has been smoking cigarettes. She has a 60 pack-year smoking history. She has never used smokeless tobacco. She reports that she does not drink alcohol and does not use drugs.   Family  History:  Her family history includes Heart attack (age of onset: 27) in her father.   Allergies Allergies  Allergen Reactions   Amoxicillin Hives   Aspirin Hives   Codeine Hives   Penicillins Hives    Has patient had a PCN reaction causing immediate rash, facial/tongue/throat swelling, SOB or lightheadedness with hypotension: Yes Has patient had a PCN reaction causing severe rash involving mucus membranes or skin necrosis: No Has patient had a PCN reaction that required hospitalization No Has patient had a PCN reaction occurring within the last 10 years: Yes If all of the above  answers are NO, then may proceed with Cephalosporin use.      Home Medications  Prior to Admission medications   Not on File     Critical care time: 

## 2023-11-29 NOTE — Progress Notes (Addendum)
 Initial Nutrition Assessment  DOCUMENTATION CODES:   Severe malnutrition in context of acute illness/injury  INTERVENTION:  If indicated and appropriate, initiate tube feeding via OGT: -Vital 1.5 at 45 ml/h (1080 ml per day) Initiate at 25ml/hr and increase by 10ml/hr q8h until goal rate achieved -Prosource TF20 60 ml once daily  Provides 1700 kcal, 93 gm protein, 825 ml free water daily   Monitor magnesium , potassium, and phosphorus daily for at least 3 days, MD to replete as needed, as pt is at risk for refeeding syndrome given severe malnutrition dx and inability to discern intake PTA.   Add Thiamine  100 mg daily for 5 days when able to use enteral access point  Add on PHOS/Mg to this morning's lab draw  Needs new A1c draw  NUTRITION DIAGNOSIS:   Severe Malnutrition related to acute illness as evidenced by severe muscle depletion, moderate fat depletion.   GOAL:  Patient will meet greater than or equal to 90% of their needs   MONITOR:  Diet advancement, Labs, Weight trends  REASON FOR ASSESSMENT:   Ventilator    ASSESSMENT:   Pt with PMH significant for: T2DM, HTN, PE, stroke, and depression. Presented with abdominal pain, distention, and N/V/D x5 days. Found to have internal hernia w/ bowel ischemia. Emergently taken to OR for resection.  11/20 - admitted 11/21 - OR(exlap): LOA and small bowel resection; extubated  Patient found to have single band of adhesion causing small bowel obstruction and internal hernia obstructing the distal ileum approximately 15cm from the ileocecal valve. She is in the process of SBT and will likely extubate today, per RN. NGT to LIS with no fluid yet in cannister, but green, bilious appearing fluid in tube noted.   NGT to remain in place to LIS and as potential enteral access, if needed. Will leave recommendations above should nutrition support be indicated over the weekend. Per surgery note, no tube feedings until return of bowel  function. Discussed with RN. Would recommend exchange NGT for Cortrak on Monday should she require longer term nutrition support while diet being advanced.   No family at bedside to report on nutrition history and patient unable to do so as she is currently intubated, but awake and alert. Nutrition-focused physical exam completed and she meets criteria for severe malnutrition in the context of acute illness as evidenced by muscle and fat depletions.  Admit Weight: 60.3 kg Current Weight: 60.7 kg  Per chart review, no recent weight history. Weight approximately the same weight one year ago. She is unable to report UBW. No significant edema on exam.   Drains/Lines: NGT (gastric) Left radial: a-line (placed 11/21) R internal jugular: CVC, double lumen Foley catheter UOP: 310 ml x12 hours  Has required potassium repletion today. Will monitor for refeeding given inability to confirm patient's nutrition history. Will add Mg/PHOS draw on to this morning's labs as she has received dextrose . Add thiamine .   Meds: pantoprazole , IV ABX Drips: Propofol  stopped @0941  on 11/21 Levo stopped @1204  on 11/21 Insulin  gtt 10 mEq KCl x6  Labs:  Na+ 134 (L) K+ 3.2>3.7>4.2 (wdl) Hgb 15.1 (H) CBGs 147-553 x24 hours A1c no new A1c x1 year - needs updated draw to assess long-term blood sugar control   NUTRITION - FOCUSED PHYSICAL EXAM:  Flowsheet Row Most Recent Value  Orbital Region Unable to assess  [ETT holder]  Upper Arm Region Moderate depletion  Thoracic and Lumbar Region Moderate depletion  Buccal Region Unable to assess  [ETT holder]  [place]  Region Moderate depletion  Clavicle Bone Region Severe depletion  Clavicle and Acromion Bone Region Severe depletion  Scapular Bone Region Moderate depletion  Dorsal Hand No depletion  Patellar Region Moderate depletion  Anterior Thigh Region Severe depletion  Posterior Calf Region Moderate depletion  Edema (RD Assessment) None  Hair Reviewed   Eyes Reviewed  Mouth Unable to assess  Skin Reviewed  Nails Reviewed     Diet Order:   Diet Order             Diet NPO time specified  Diet effective now             EDUCATION NEEDS:  Not appropriate for education at this time  Skin:  Skin Assessment: Reviewed RN Assessment  Last BM:  PTA  Height:  Ht Readings from Last 1 Encounters:  11/29/23 5' 6 (1.676 m)   Weight:  Wt Readings from Last 1 Encounters:  11/30/23 62.4 kg   Ideal Body Weight:  59.1 kg  BMI:  Body mass index is 22.2 kg/m.  Estimated Nutritional Needs:   Kcal:  1700-1900 kcals  Protein:  90-105g  Fluid:  1.7-1.9L/day  Blair Deaner MS, RD, LDN Registered Dietitian Clinical Nutrition RD Inpatient Contact Info in Amion

## 2023-11-29 NOTE — Transfer of Care (Signed)
 Immediate Anesthesia Transfer of Care Note  Patient: [name]  Procedure(s) Performed: LAPAROTOMY, EXPLORATORY EXCISION, SMALL INTESTINE (Abdomen)  Patient Location: ICU  Anesthesia Type:General  Level of Consciousness: Patient remains intubated per anesthesia plan  Airway & Oxygen Therapy: Patient remains intubated per anesthesia plan and Patient placed on Ventilator (see vital sign flow sheet for setting)  Post-op Assessment: Report given to RN and Post -op Vital signs reviewed and stable  Post vital signs: Reviewed and stable  Last Vitals:  Vitals Value Taken Time  BP    Temp 36.2 C 11/29/23 02:00  Pulse 97 11/29/23 02:21  Resp 13 11/29/23 02:21  SpO2 100 % 11/29/23 02:21  Vitals shown include unfiled device data.  Last Pain:  Vitals:   11/29/23 0200  TempSrc: Axillary  PainSc:          Complications: No notable events documented.

## 2023-11-29 NOTE — Progress Notes (Signed)
 Both of pt's sons updated at bedside. All answers/concerns addressed. Son's contact info added to chart. Would like to be updated with any significant changes.

## 2023-11-29 NOTE — Progress Notes (Signed)
 57 year old female who was admitted overnight with abdominal pain, noted to have small bowel obstruction and ischemia underwent laparotomy and resection of small bowel remain on mechanical ventilation  Patient is awake, following commands, continue to complain of surgical site pain  Tolerating spontaneous breathing trial  Off sedation, will try to extubate her  Continue IV antibiotics, still requiring vasopressor support, titrate MAP goal 65   Valinda Novas, MD

## 2023-11-29 NOTE — Procedures (Signed)
 Extubation Procedure Note  Patient Details:   Name: [name] DOB: 04-03-1965 MRN: [id number]   Airway Documentation:    Vent end date: 11/29/23 Vent end time: 1048   Evaluation  O2 sats: stable throughout Complications: No apparent complications Patient did tolerate procedure well. Bilateral Breath Sounds: Clear, Diminished   Yes  [name] 11/29/2023, 10:48 AM

## 2023-11-29 NOTE — Progress Notes (Signed)
 eLink Physician-Brief Progress Note Patient Name: [name] DOB: 1965-12-09 MRN: [id number]   Date of Service  11/29/2023  HPI/Events of Note  D5 LR infusion has expired, on hyperglycemia protocol insulin .  CBGs have been stable, low-dose insulin  infusion  eICU Interventions  Transition to SSI every 4 hours, strict n.p.o.     Intervention Category Major Interventions: Hyperglycemia - active titration of insulin  therapy  Eurydice Calixto 11/29/2023, 11:20 PM

## 2023-11-30 DIAGNOSIS — E1110 Type 2 diabetes mellitus with ketoacidosis without coma: Secondary | ICD-10-CM

## 2023-11-30 DIAGNOSIS — J9589 Other postprocedural complications and disorders of respiratory system, not elsewhere classified: Secondary | ICD-10-CM | POA: Diagnosis not present

## 2023-11-30 DIAGNOSIS — R6521 Severe sepsis with septic shock: Secondary | ICD-10-CM | POA: Diagnosis not present

## 2023-11-30 DIAGNOSIS — E440 Moderate protein-calorie malnutrition: Secondary | ICD-10-CM | POA: Insufficient documentation

## 2023-11-30 DIAGNOSIS — K56609 Unspecified intestinal obstruction, unspecified as to partial versus complete obstruction: Secondary | ICD-10-CM | POA: Diagnosis not present

## 2023-11-30 LAB — CBC
HCT: 42.2 % (ref 36.0–46.0)
Hemoglobin: 13.8 g/dL (ref 12.0–15.0)
MCH: 31.4 pg (ref 26.0–34.0)
MCHC: 32.7 g/dL (ref 30.0–36.0)
MCV: 96.1 fL (ref 80.0–100.0)
Platelets: 100 K/uL — ABNORMAL LOW (ref 150–400)
RBC: 4.39 MIL/uL (ref 3.87–5.11)
RDW: 13.2 % (ref 11.5–15.5)
WBC: 10.3 K/uL (ref 4.0–10.5)
nRBC: 0 % (ref 0.0–0.2)

## 2023-11-30 LAB — MAGNESIUM: Magnesium: 1.8 mg/dL (ref 1.7–2.4)

## 2023-11-30 LAB — BASIC METABOLIC PANEL WITH GFR
Anion gap: 10 (ref 5–15)
BUN: 23 mg/dL — ABNORMAL HIGH (ref 6–20)
CO2: 23 mmol/L (ref 22–32)
Calcium: 7.8 mg/dL — ABNORMAL LOW (ref 8.9–10.3)
Chloride: 104 mmol/L (ref 98–111)
Creatinine, Ser: 0.92 mg/dL (ref 0.44–1.00)
GFR, Estimated: >60 mL/min (ref >60)
Glucose, Bld: 193 mg/dL — ABNORMAL HIGH (ref 70–99)
Potassium: 4 mmol/L (ref 3.5–5.1)
Sodium: 137 mmol/L (ref 135–145)

## 2023-11-30 LAB — GLUCOSE, CAPILLARY
Glucose-Capillary: 118 mg/dL — ABNORMAL HIGH (ref 70–99)
Glucose-Capillary: 124 mg/dL — ABNORMAL HIGH (ref 70–99)
Glucose-Capillary: 151 mg/dL — ABNORMAL HIGH (ref 70–99)
Glucose-Capillary: 198 mg/dL — ABNORMAL HIGH (ref 70–99)
Glucose-Capillary: 84 mg/dL (ref 70–99)
Glucose-Capillary: 89 mg/dL (ref 70–99)

## 2023-11-30 LAB — HEMOGLOBIN A1C
Hgb A1c MFr Bld: 9.7 % — ABNORMAL HIGH (ref 4.8–5.6)
Mean Plasma Glucose: 231.69 mg/dL

## 2023-11-30 LAB — PHOSPHORUS: Phosphorus: 2.3 mg/dL — ABNORMAL LOW (ref 2.5–4.6)

## 2023-11-30 MED ORDER — ACETAMINOPHEN 500 MG PO TABS
1000.0000 mg | ORAL_TABLET | Freq: Four times a day (QID) | ORAL | Status: DC
  Administered 2023-11-30 – 2023-12-03 (×12): 1000 mg via ORAL
  Filled 2023-11-30 (×13): qty 2

## 2023-11-30 MED ORDER — OXYCODONE HCL 5 MG PO TABS
5.0000 mg | ORAL_TABLET | ORAL | Status: DC | PRN
  Administered 2023-11-30: 5 mg via ORAL
  Administered 2023-11-30 (×2): 7.5 mg via ORAL
  Administered 2023-11-30: 5 mg via ORAL
  Administered 2023-12-01 (×3): 7.5 mg via ORAL
  Administered 2023-12-02 – 2023-12-03 (×2): 5 mg via ORAL
  Filled 2023-11-30: qty 2
  Filled 2023-11-30 (×2): qty 1
  Filled 2023-11-30: qty 2
  Filled 2023-11-30: qty 1
  Filled 2023-11-30: qty 2
  Filled 2023-11-30: qty 1
  Filled 2023-11-30 (×2): qty 2

## 2023-11-30 MED ORDER — METHOCARBAMOL 500 MG PO TABS
1000.0000 mg | ORAL_TABLET | Freq: Three times a day (TID) | ORAL | Status: DC
  Administered 2023-11-30 – 2023-12-01 (×4): 1000 mg via ORAL
  Filled 2023-11-30 (×4): qty 2

## 2023-11-30 MED ORDER — ENOXAPARIN SODIUM 40 MG/0.4ML IJ SOSY
40.0000 mg | PREFILLED_SYRINGE | INTRAMUSCULAR | Status: DC
  Administered 2023-11-30 – 2023-12-03 (×4): 40 mg via SUBCUTANEOUS
  Filled 2023-11-30 (×4): qty 0.4

## 2023-11-30 MED ORDER — LACTATED RINGERS IV BOLUS
1000.0000 mL | Freq: Once | INTRAVENOUS | Status: AC
  Administered 2023-11-30: 1000 mL via INTRAVENOUS

## 2023-11-30 MED ORDER — MAGNESIUM SULFATE 2 GM/50ML IV SOLN
2.0000 g | Freq: Once | INTRAVENOUS | Status: AC
  Administered 2023-11-30: 2 g via INTRAVENOUS
  Filled 2023-11-30: qty 50

## 2023-11-30 MED ORDER — SODIUM PHOSPHATES 45 MMOLE/15ML IV SOLN
30.0000 mmol | Freq: Once | INTRAVENOUS | Status: AC
  Administered 2023-11-30: 30 mmol via INTRAVENOUS
  Filled 2023-11-30: qty 10

## 2023-11-30 NOTE — Progress Notes (Signed)
 Reached out to E-link regarding D/C of maintenance fluids (D5LR gtt expired w/ insulin  gtt). Bps have been soft w/ systolic in the 80s/90s, MAPs >34. UOP: ~70ml/hr, Pt A&Ox4. No new orders at this time.

## 2023-11-30 NOTE — Plan of Care (Signed)
  Problem: Education: Goal: Knowledge of General Education information will improve Description: Including pain rating scale, medication(s)/side effects and non-pharmacologic comfort measures Outcome: Progressing   Problem: Clinical Measurements: Goal: Ability to maintain clinical measurements within normal limits will improve Outcome: Progressing Goal: Diagnostic test results will improve Outcome: Progressing Goal: Respiratory complications will improve Outcome: Progressing Goal: Cardiovascular complication will be avoided Outcome: Progressing   Problem: Activity: Goal: Risk for activity intolerance will decrease Outcome: Progressing   Problem: Nutrition: Goal: Adequate nutrition will be maintained Outcome: Progressing   Problem: Pain Managment: Goal: General experience of comfort will improve and/or be controlled Outcome: Progressing   Problem: Safety: Goal: Ability to remain free from injury will improve Outcome: Progressing

## 2023-11-30 NOTE — Consult Note (Signed)
 NAME:  [name], MRN:  [id number], DOB:  03/15/1965, LOS: 2 ADMISSION DATE:  11/28/2023, CONSULTATION DATE:  11/29/23 REFERRING MD:  Dann, CHIEF COMPLAINT:  post operative resp insuff   History of Present Illness:  58 yo female presented to ed today 2/2 abdominal pain, distention and n/v/d.  Symptoms began 5 days ago.  They have not improved. Reportedly there are additional family members whom have had a viral illness the past week as well. Denied f/c no cp/sob/ha/dizziness. Unable to take po. Non adherent to medication regimen.   Pt was found to have internal hernia with bowel ischemia noted. Surgery was called to eval and they are taking pt to OR for resection with anticipation pt will remain intubated post operatively they have requested ccm follow for medical management. Pt has no history of abdominal surgeries.   Pertinent  Medical History  H/o cva with R deficits H/o PE H/o dvt Hypertension H/o tobacco use Anxiety  Depression T2dm with hyperglycemia  Significant Hospital Events: Including procedures, antibiotic start and stop dates in addition to other pertinent events   Taken to OR 11/21, admitted to ICU post OR 11/21  Interim History / Subjective:  Patient was successfully extubated yesterday Remains on low-dose vasopressor support with Levophed  2 mics even though MAP is in 80s Continue to complain of abdominal pain, denies nausea and vomiting Remained afebrile  Objective    Blood pressure 100/68, pulse 95, temperature 99.8 F (37.7 C), temperature source Axillary, resp. rate 14, height 5' 6 (1.676 m), weight 62.4 kg, SpO2 97%.    Vent Mode: Stand-by   Intake/Output Summary (Last 24 hours) at 11/30/2023 1002 Last data filed at 11/30/2023 0900 Gross per 24 hour  Intake 2451.79 ml  Output 1325 ml  Net 1126.79 ml   Filed Weights   11/28/23 1822 11/29/23 0630 11/30/23 0630  Weight: 60.3 kg 60.7 kg 62.4 kg    Examination: General: Acutely ill-appearing  female, lying on the bed HEENT: [place]/AT, eyes anicteric.  Dry mucus membranes, NGT in place Neuro: Alert, awake following commands Chest: Coarse breath sounds, no wheezes or rhonchi Heart: Regular rate and rhythm, no murmurs or gallops Abdomen: Soft, tender all over, sluggish bowel sounds present  Labs reviewed  Patient Lines/Drains/Airways Status     Active Line/Drains/Airways     Name Placement date Placement time Site Days   Peripheral IV 11/28/23 20 G 1.88 Anterior;Right Forearm 11/28/23  2215  Forearm  2   Peripheral IV 11/28/23 20 G 2.5 Anterior;Distal;Left;Upper Arm 11/28/23  2236  Arm  2   CVC Double Lumen 11/29/23 Right Internal jugular 16 cm 11/29/23  0109  -- 1   NG/OG Vented/Dual Lumen 16 Fr. Right nare External length of tube 37 cm 11/29/23  0012  Right nare  1   Wound 11/29/23 0140 Surgical Closed Surgical Incision Abdomen Other (Comment) 11/29/23  0140  Abdomen  1         Resolved problem list   Assessment and Plan  Sepsis with septic shock due to bowel obstruction due to internal hernia/bowel ischemia status post ex lap and resection of small bowel  Acute respiratory insufficiency, postprocedure Hypokalemia/hypomagnesemia/hypophosphatemia Poorly controlled diabetes type 2 presented with DKA Patient came off of vasopressor support Sepsis indices improved Antibiotics were stopped per general surgery Continue NGT, general surgery recommend clamping NGT later today Continue as needed fentanyl  and oxycodone  for pain control Patient was successfully extubated yesterday Continue aggressive electrolyte replacement Insulin  infusion was transition to sliding scale  Monitor fingersticks goal 140-180 Hemoglobin A1c is 9.7   Labs   CBC: Recent Labs  Lab 11/28/23 1810 11/28/23 1830 11/29/23 0102 11/29/23 0110 11/29/23 0358 11/29/23 0811 11/30/23 0415  WBC 10.1  --   --   --   --  9.3 10.3  NEUTROABS 9.0*  --   --   --   --   --   --   HGB 17.8*   < > 14.6  15.6* 14.6 15.1* 13.8  HCT 54.1*   < > 43.0 46.0 43.0 44.5 42.2  MCV 95.2  --   --   --   --  94.3 96.1  PLT 197  --   --   --   --  162 100*   < > = values in this interval not displayed.    Basic Metabolic Panel: Recent Labs  Lab 11/28/23 1810 11/28/23 1830 11/28/23 2019 11/29/23 0102 11/29/23 0110 11/29/23 0324 11/29/23 0358 11/29/23 0811 11/29/23 1216 11/30/23 0415  NA 133*   < > 132*   < > 139 138 138 134* 138  --   K 4.6   < > 4.6   < > 3.5 3.2* 3.7 4.2 4.4  --   CL 95*   < > 94*  --  102 104  --  99 105  --   CO2 18*  --  17*  --   --  21*  --  23 22  --   GLUCOSE 477*   < > 553*  --  180* 147*  --  162* 147*  --   BUN 24*   < > 27*  --  25* 21*  --  22* 23*  --   CREATININE 1.23*   < > 1.32*  --  0.80 0.73  --  0.77 0.89  --   CALCIUM  8.9  --  8.8*  --   --  7.9*  --  7.8* 8.0*  --   MG  --   --   --   --   --   --   --   --  1.6* 1.8  PHOS  --   --   --   --   --   --   --   --  2.5 2.3*   < > = values in this interval not displayed.   GFR: Estimated Creatinine Clearance: 63.7 mL/min (by C-G formula based on SCr of 0.89 mg/dL). Recent Labs  Lab 11/28/23 1810 11/29/23 0811 11/29/23 0943 11/29/23 1230 11/30/23 0415  WBC 10.1 9.3  --   --  10.3  LATICACIDVEN  --   --  1.4 0.9  --     Liver Function Tests: Recent Labs  Lab 11/28/23 1810  AST 52*  ALT 45*  ALKPHOS 101  BILITOT 1.3*  PROT 6.9  ALBUMIN  3.5   Recent Labs  Lab 11/28/23 1810  LIPASE 24   No results for input(s): AMMONIA in the last 168 hours.  ABG    Component Value Date/Time   PHART 7.379 11/29/2023 0358   PCO2ART 40.8 11/29/2023 0358   PO2ART 253 (H) 11/29/2023 0358   HCO3 24.3 11/29/2023 0358   TCO2 26 11/29/2023 0358   ACIDBASEDEF 1.0 11/29/2023 0358   O2SAT 100 11/29/2023 0358     Coagulation Profile: No results for input(s): INR, PROTIME in the last 168 hours.  Cardiac Enzymes: No results for input(s): CKTOTAL, CKMB, CKMBINDEX, TROPONINI in the last 168  hours.  HbA1C:  Hgb A1c MFr Bld  Date/Time Value Ref Range Status  11/30/2023 04:15 AM 9.7 (H) 4.8 - 5.6 % Final    Comment:    (NOTE) Diagnosis of Diabetes The following HbA1c ranges recommended by the American Diabetes Association (ADA) may be used as an aid in the diagnosis of diabetes mellitus.  Hemoglobin             Suggested A1C NGSP%              Diagnosis  <5.7                   Non Diabetic  5.7-6.4                Pre-Diabetic  >6.4                   Diabetic  <7.0                   Glycemic control for                       adults with diabetes.    03/20/2022 06:30 PM 11.4 (H) 4.8 - 5.6 % Final    Comment:    (NOTE)         Prediabetes: 5.7 - 6.4         Diabetes: >6.4         Glycemic control for adults with diabetes: <7.0   d  CBG: Recent Labs  Lab 11/29/23 2121 11/29/23 2232 11/29/23 2350 11/30/23 0332 11/30/23 0759  GLUCAP 128* 151* 119* 198* 151*     Valinda Novas, MD [place] [facility] See Amion for pager If no response to pager, please call [phone] until 7pm After 7pm, Please call E-link [phone]

## 2023-11-30 NOTE — Progress Notes (Signed)
 Trauma/Critical Care Follow Up Note  Subjective:    Overnight Issues:   Objective:  Vital signs for last 24 hours: Temp:  [97.1 F (36.2 C)-99.5 F (37.5 C)] 99.2 F (37.3 C) (11/22 0000) Pulse Rate:  [87-122] 111 (11/21 2250) Resp:  [5-25] 14 (11/21 2250) BP: (80-132)/(56-93) 90/66 (11/21 2250) SpO2:  [92 %-100 %] 94 % (11/21 2250) Arterial Line BP: (18-187)/(-41-129) 89/22 (11/21 0915) FiO2 (%):  [40 %] 40 % (11/21 0747) Weight:  [60.7 kg] 60.7 kg (11/21 0630)  Hemodynamic parameters for last 24 hours:    Intake/Output from previous day: 11/21 0701 - 11/22 0700 In: 1974 [I.V.:1271.7; IV Piggyback:702.2] Out: 1055 [Urine:555; Emesis/NG output:500]  Intake/Output this shift: Total I/O In: 395.3 [I.V.:295.2; IV Piggyback:100.1] Out: 305 [Urine:205; Emesis/NG output:100]  Vent settings for last 24 hours: Vent Mode: Stand-by FiO2 (%):  [40 %] 40 % Set Rate:  [16 bmp] 16 bmp Vt Set:  [470 mL] 470 mL PEEP:  [5 cmH20] 5 cmH20 Plateau Pressure:  [12 cmH20] 12 cmH20  Physical Exam:  Gen: comfortable, no distress Neuro: follows commands, alert, communicative HEENT: PERRL Neck: supple CV: RRR Pulm: unlabored breathing on RA Abd: soft, NT, incision clean, dry, intact , no recent BM GU: urine clear and yellow, +Foley Extr: wwp, no edema  Results for orders placed or performed during the hospital encounter of 11/28/23 (from the past 24 hours)  I-STAT 7, (LYTES, BLD GAS, ICA, H+H)     Status: Abnormal   Collection Time: 11/29/23  3:58 AM  Result Value Ref Range   pH, Arterial 7.379 7.35 - 7.45   pCO2 arterial 40.8 32 - 48 mmHg   pO2, Arterial 253 (H) 83 - 108 mmHg   Bicarbonate 24.3 20.0 - 28.0 mmol/L   TCO2 26 22 - 32 mmol/L   O2 Saturation 100 %   Acid-base deficit 1.0 0.0 - 2.0 mmol/L   Sodium 138 135 - 145 mmol/L   Potassium 3.7 3.5 - 5.1 mmol/L   Calcium , Ion 1.13 (L) 1.15 - 1.40 mmol/L   HCT 43.0 36.0 - 46.0 %   Hemoglobin 14.6 12.0 - 15.0 g/dL   Patient  temperature 97.2 F    Sample type ARTERIAL   Glucose, capillary     Status: Abnormal   Collection Time: 11/29/23  4:39 AM  Result Value Ref Range   Glucose-Capillary 148 (H) 70 - 99 mg/dL  Blood culture (routine x 2)     Status: None (Preliminary result)   Collection Time: 11/29/23  5:22 AM   Specimen: BLOOD RIGHT HAND  Result Value Ref Range   Specimen Description BLOOD RIGHT HAND    Special Requests      BOTTLES DRAWN AEROBIC ONLY Blood Culture results may not be optimal due to an inadequate volume of blood received in culture bottles   Culture      NO GROWTH < 12 HOURS Performed at [facility], [address], [place], [place] [address]    Report Status PENDING   Glucose, capillary     Status: Abnormal   Collection Time: 11/29/23  5:40 AM  Result Value Ref Range   Glucose-Capillary 174 (H) 70 - 99 mg/dL  Glucose, capillary     Status: Abnormal   Collection Time: 11/29/23  6:51 AM  Result Value Ref Range   Glucose-Capillary 161 (H) 70 - 99 mg/dL  Glucose, capillary     Status: Abnormal   Collection Time: 11/29/23  7:57 AM  Result Value  Ref Range   Glucose-Capillary 174 (H) 70 - 99 mg/dL  Basic metabolic panel     Status: Abnormal   Collection Time: 11/29/23  8:11 AM  Result Value Ref Range   Sodium 134 (L) 135 - 145 mmol/L   Potassium 4.2 3.5 - 5.1 mmol/L   Chloride 99 98 - 111 mmol/L   CO2 23 22 - 32 mmol/L   Glucose, Bld 162 (H) 70 - 99 mg/dL   BUN 22 (H) 6 - 20 mg/dL   Creatinine, Ser 9.22 0.44 - 1.00 mg/dL   Calcium  7.8 (L) 8.9 - 10.3 mg/dL   GFR, Estimated >39 >39 mL/min   Anion gap 12 5 - 15  Beta-hydroxybutyric acid     Status: Abnormal   Collection Time: 11/29/23  8:11 AM  Result Value Ref Range   Beta-Hydroxybutyric Acid <0.05 (L) 0.05 - 0.27 mmol/L  CBC     Status: Abnormal   Collection Time: 11/29/23  8:11 AM  Result Value Ref Range   WBC 9.3 4.0 - 10.5 K/uL   RBC 4.72 3.87 - 5.11 MIL/uL   Hemoglobin 15.1 (H) 12.0 - 15.0 g/dL   HCT 55.4 63.9 -  53.9 %   MCV 94.3 80.0 - 100.0 fL   MCH 32.0 26.0 - 34.0 pg   MCHC 33.9 30.0 - 36.0 g/dL   RDW 86.8 88.4 - 84.4 %   Platelets 162 150 - 400 K/uL   nRBC 0.0 0.0 - 0.2 %  Glucose, capillary     Status: Abnormal   Collection Time: 11/29/23  9:00 AM  Result Value Ref Range   Glucose-Capillary 144 (H) 70 - 99 mg/dL  Lactic acid, plasma     Status: None   Collection Time: 11/29/23  9:43 AM  Result Value Ref Range   Lactic Acid, Venous 1.4 0.5 - 1.9 mmol/L  Glucose, capillary     Status: Abnormal   Collection Time: 11/29/23  9:45 AM  Result Value Ref Range   Glucose-Capillary 155 (H) 70 - 99 mg/dL  Glucose, capillary     Status: Abnormal   Collection Time: 11/29/23 10:53 AM  Result Value Ref Range   Glucose-Capillary 128 (H) 70 - 99 mg/dL  Glucose, capillary     Status: Abnormal   Collection Time: 11/29/23 12:14 PM  Result Value Ref Range   Glucose-Capillary 159 (H) 70 - 99 mg/dL  Basic metabolic panel     Status: Abnormal   Collection Time: 11/29/23 12:16 PM  Result Value Ref Range   Sodium 138 135 - 145 mmol/L   Potassium 4.4 3.5 - 5.1 mmol/L   Chloride 105 98 - 111 mmol/L   CO2 22 22 - 32 mmol/L   Glucose, Bld 147 (H) 70 - 99 mg/dL   BUN 23 (H) 6 - 20 mg/dL   Creatinine, Ser 9.10 0.44 - 1.00 mg/dL   Calcium  8.0 (L) 8.9 - 10.3 mg/dL   GFR, Estimated >39 >39 mL/min   Anion gap 11 5 - 15  Beta-hydroxybutyric acid     Status: Abnormal   Collection Time: 11/29/23 12:16 PM  Result Value Ref Range   Beta-Hydroxybutyric Acid 1.09 (H) 0.05 - 0.27 mmol/L  Phosphorus     Status: None   Collection Time: 11/29/23 12:16 PM  Result Value Ref Range   Phosphorus 2.5 2.5 - 4.6 mg/dL  Magnesium      Status: Abnormal   Collection Time: 11/29/23 12:16 PM  Result Value Ref Range   Magnesium  1.6 (L) 1.7 -  2.4 mg/dL  Lactic acid, plasma     Status: None   Collection Time: 11/29/23 12:30 PM  Result Value Ref Range   Lactic Acid, Venous 0.9 0.5 - 1.9 mmol/L  Glucose, capillary     Status:  Abnormal   Collection Time: 11/29/23  1:48 PM  Result Value Ref Range   Glucose-Capillary 179 (H) 70 - 99 mg/dL  Glucose, capillary     Status: Abnormal   Collection Time: 11/29/23  3:13 PM  Result Value Ref Range   Glucose-Capillary 164 (H) 70 - 99 mg/dL  Glucose, capillary     Status: Abnormal   Collection Time: 11/29/23  3:58 PM  Result Value Ref Range   Glucose-Capillary 208 (H) 70 - 99 mg/dL  Glucose, capillary     Status: Abnormal   Collection Time: 11/29/23  4:53 PM  Result Value Ref Range   Glucose-Capillary 205 (H) 70 - 99 mg/dL  Glucose, capillary     Status: Abnormal   Collection Time: 11/29/23  6:20 PM  Result Value Ref Range   Glucose-Capillary 187 (H) 70 - 99 mg/dL  Glucose, capillary     Status: Abnormal   Collection Time: 11/29/23  7:28 PM  Result Value Ref Range   Glucose-Capillary 156 (H) 70 - 99 mg/dL  Glucose, capillary     Status: Abnormal   Collection Time: 11/29/23  8:34 PM  Result Value Ref Range   Glucose-Capillary 170 (H) 70 - 99 mg/dL  Glucose, capillary     Status: Abnormal   Collection Time: 11/29/23  9:21 PM  Result Value Ref Range   Glucose-Capillary 128 (H) 70 - 99 mg/dL  Glucose, capillary     Status: Abnormal   Collection Time: 11/29/23 10:32 PM  Result Value Ref Range   Glucose-Capillary 151 (H) 70 - 99 mg/dL  Glucose, capillary     Status: Abnormal   Collection Time: 11/29/23 11:50 PM  Result Value Ref Range   Glucose-Capillary 119 (H) 70 - 99 mg/dL  Glucose, capillary     Status: Abnormal   Collection Time: 11/30/23  3:32 AM  Result Value Ref Range   Glucose-Capillary 198 (H) 70 - 99 mg/dL    Assessment & Plan: The plan of care was discussed with the bedside nurse for the night, who is in agreement with this plan and no additional concerns were raised.   Present on Admission:  Bowel obstruction (HCC)    LOS: 2 days   Additional comments:I reviewed the patient's new clinical lab test results.   and I reviewed the patients new  imaging test results.    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 Sebastian. AcROBF. Pain regimen adjusted VDRF - extubated 11/21, doing well Uncontrolled DM - management per CCM, insulin  gtt, BG much improved UA with many bacteria and trace LE - recommend urine culture FEN - NGT for decompression, okay for CLD, clamp NG later today DVT - SCDs, LMWH okay to start from my standpoint ID - okay to d/c abx from my standpoint Dispo - per primary    Dreama GEANNIE Hanger, MD Trauma & General Surgery Please use [email or web address] to contact on call provider  11/30/2023  *Care during the described time interval was provided by me. I have reviewed this patient's available data, including medical history, events of note, physical examination and test results as part of my evaluation.

## 2023-11-30 NOTE — Evaluation (Signed)
 Physical Therapy Evaluation Patient Details Name: [name] MRN: [id number] DOB: 1965-03-15 Today's Date: 11/30/2023  History of Present Illness  Pt is a 58 y.o. female who presented 11/28/23 for abd pain, N/V/D, and elevated CBG. CT scan shows internal hernia with small bowel obstruction with possible developing bowel ischemia. 11/21 exp lap with small bowel resection; PMH: recurrent PE, CVA with Rt sided deficits, DVT, HTN  Clinical Impression   Patient is s/p above surgery resulting in functional limitations due to the deficits listed below (see PT Problem List). Patient pre-medicated for pain, yet reports 10/10 pre- and post-mobility. Patient only able to take steps from bed to chair. Anticipate will quickly progress to ambulation with RW (which is her baseline). Currently no followup PT needs anticipated. Patient will benefit from acute skilled PT to increase their independence and safety with mobility to facilitate discharge.         If plan is discharge home, recommend the following: A little help with walking and/or transfers;A little help with bathing/dressing/bathroom;Assistance with cooking/housework;Assist for transportation;Help with stairs or ramp for entrance   Can travel by private vehicle        Equipment Recommendations None recommended by PT  Recommendations for Other Services       Functional Status Assessment Patient has had a recent decline in their functional status and demonstrates the ability to make significant improvements in function in a reasonable and predictable amount of time.     Precautions / Restrictions Precautions Precautions: Fall;Other (comment) Recall of Precautions/Restrictions: Intact Precaution/Restrictions Comments: abdominal; NG tube      Mobility  Bed Mobility Overal bed mobility: Needs Assistance Bed Mobility: Rolling, Sidelying to Sit Rolling: Contact guard assist Sidelying to sit: Contact guard assist       General  bed mobility comments: vc for technique but no physical assist    Transfers Overall transfer level: Needs assistance Equipment used: Rolling walker (2 wheels) Transfers: Sit to/from Stand, Bed to chair/wheelchair/BSC Sit to Stand: Contact guard assist   Step pivot transfers: Contact guard assist       General transfer comment: no instructional cues needed; CGA for lines/safety    Ambulation/Gait                  Stairs            Wheelchair Mobility     Tilt Bed    Modified Rankin (Stroke Patients Only)       Balance Overall balance assessment: Mild deficits observed, not formally tested (uses RW at baseline)                                           Pertinent Vitals/Pain Pain Assessment Pain Assessment: 0-10 Pain Score: 10-Worst pain ever Pain Location: abd Pain Descriptors / Indicators: Constant, Operative site guarding Pain Intervention(s): Limited activity within patient's tolerance, Monitored during session, Premedicated before session, Repositioned, Other (comment) (use of pillow to splint)    Home Living Family/patient expects to be discharged to:: Private residence Living Arrangements: Spouse/significant other;Other relatives Available Help at Discharge: Family;Available 24 hours/day Type of Home: House Home Access: Stairs to enter Entrance Stairs-Rails: None Entrance Stairs-Number of Steps: 2   Home Layout: One level Home Equipment: Agricultural [employer]ant (2 wheels);Shower seat      Prior Function Prior Level of Function : Needs assist  Mobility Comments: uses RW; assist on steps into home one person on each side       Extremity/Trunk Assessment   Upper Extremity Assessment Upper Extremity Assessment: Overall WFL for tasks assessed    Lower Extremity Assessment Lower Extremity Assessment: Overall WFL for tasks assessed    Cervical / Trunk Assessment Cervical / Trunk Assessment: Kyphotic   Communication   Communication Communication: No apparent difficulties    Cognition Arousal: Alert Behavior During Therapy: WFL for tasks assessed/performed   PT - Cognitive impairments: No apparent impairments                         Following commands: Intact       Cueing Cueing Techniques: Verbal cues     General Comments General comments (skin integrity, edema, etc.): VSS per ICU monitor    Exercises     Assessment/Plan    PT Assessment Patient needs continued PT services  PT Problem List Decreased activity tolerance;Decreased balance;Decreased mobility;Decreased knowledge of precautions;Pain       PT Treatment Interventions DME instruction;Gait training;Stair training;Functional mobility training;Therapeutic activities;Balance training;Patient/family education    PT Goals (Current goals can be found in the Care Plan section)  Acute Rehab PT Goals Patient Stated Goal: return home when medically ready PT Goal Formulation: With patient Time For Goal Achievement: 12/14/23 Potential to Achieve Goals: Good    Frequency Min 3X/week     Co-evaluation               AM-PAC PT 6 Clicks Mobility  Outcome Measure Help needed turning from your back to your side while in a flat bed without using bedrails?: A Little Help needed moving from lying on your back to sitting on the side of a flat bed without using bedrails?: A Little Help needed moving to and from a bed to a chair (including a wheelchair)?: A Little Help needed standing up from a chair using your arms (e.g., wheelchair or bedside chair)?: A Little Help needed to walk in hospital room?: Total (<20 ft) Help needed climbing 3-5 steps with a railing? : Total 6 Click Score: 14    End of Session   Activity Tolerance: Patient limited by pain Patient left: in chair;with call bell/phone within reach;with chair alarm set Nurse Communication: Mobility status PT Visit Diagnosis: Unsteadiness on feet  (R26.81)    Time: 8663-8645 PT Time Calculation (min) (ACUTE ONLY): 18 min   Charges:   PT Evaluation $PT Eval Low Complexity: 1 Low   PT General Charges $$ ACUTE PT VISIT: 1 Visit          [name], PT Acute Rehabilitation Services  Office [phone]   [name] 11/30/2023, 3:04 PM

## 2023-12-01 DIAGNOSIS — K56691 Other complete intestinal obstruction: Secondary | ICD-10-CM | POA: Diagnosis not present

## 2023-12-01 DIAGNOSIS — R579 Shock, unspecified: Secondary | ICD-10-CM

## 2023-12-01 LAB — MAGNESIUM: Magnesium: 1.9 mg/dL (ref 1.7–2.4)

## 2023-12-01 LAB — GLUCOSE, CAPILLARY
Glucose-Capillary: 100 mg/dL — ABNORMAL HIGH (ref 70–99)
Glucose-Capillary: 100 mg/dL — ABNORMAL HIGH (ref 70–99)
Glucose-Capillary: 98 mg/dL (ref 70–99)
Glucose-Capillary: 103 mg/dL — ABNORMAL HIGH (ref 70–99)
Glucose-Capillary: 118 mg/dL — ABNORMAL HIGH (ref 70–99)

## 2023-12-01 LAB — CBC
HCT: 39.2 % (ref 36.0–46.0)
Hemoglobin: 12.7 g/dL (ref 12.0–15.0)
MCH: 31.8 pg (ref 26.0–34.0)
MCHC: 32.4 g/dL (ref 30.0–36.0)
MCV: 98.2 fL (ref 80.0–100.0)
Platelets: 89 K/uL — ABNORMAL LOW (ref 150–400)
RBC: 3.99 MIL/uL (ref 3.87–5.11)
RDW: 13.2 % (ref 11.5–15.5)
WBC: 10.3 K/uL (ref 4.0–10.5)
nRBC: 0 % (ref 0.0–0.2)

## 2023-12-01 LAB — BASIC METABOLIC PANEL WITH GFR
Anion gap: 5 (ref 5–15)
BUN: 16 mg/dL (ref 6–20)
CO2: 26 mmol/L (ref 22–32)
Calcium: 7.7 mg/dL — ABNORMAL LOW (ref 8.9–10.3)
Chloride: 103 mmol/L (ref 98–111)
Creatinine, Ser: 0.68 mg/dL (ref 0.44–1.00)
GFR, Estimated: >60 mL/min (ref >60)
Glucose, Bld: 99 mg/dL (ref 70–99)
Potassium: 3.5 mmol/L (ref 3.5–5.1)
Sodium: 134 mmol/L — ABNORMAL LOW (ref 135–145)

## 2023-12-01 LAB — PHOSPHORUS: Phosphorus: 2.8 mg/dL (ref 2.5–4.6)

## 2023-12-01 MED ORDER — PANTOPRAZOLE SODIUM 40 MG PO TBEC
40.0000 mg | DELAYED_RELEASE_TABLET | Freq: Every day | ORAL | Status: DC
  Administered 2023-12-01 – 2023-12-02 (×2): 40 mg via ORAL
  Filled 2023-12-01 (×2): qty 1

## 2023-12-01 MED ORDER — INSULIN ASPART 100 UNIT/ML IJ SOLN
0.0000 [IU] | Freq: Three times a day (TID) | INTRAMUSCULAR | Status: DC
  Administered 2023-12-02 – 2023-12-03 (×3): 2 [IU] via SUBCUTANEOUS
  Filled 2023-12-01 (×4): qty 2

## 2023-12-01 MED ORDER — IBUPROFEN 200 MG PO TABS
400.0000 mg | ORAL_TABLET | Freq: Three times a day (TID) | ORAL | Status: DC
  Administered 2023-12-01 – 2023-12-03 (×7): 400 mg via ORAL
  Filled 2023-12-01 (×7): qty 2

## 2023-12-01 MED ORDER — LIDOCAINE 5 % EX PTCH
2.0000 | MEDICATED_PATCH | CUTANEOUS | Status: DC
  Administered 2023-12-01 – 2023-12-03 (×2): 2 via TRANSDERMAL
  Filled 2023-12-01 (×3): qty 2

## 2023-12-01 MED ORDER — METHOCARBAMOL 500 MG PO TABS
1000.0000 mg | ORAL_TABLET | Freq: Four times a day (QID) | ORAL | Status: DC
  Administered 2023-12-01 – 2023-12-03 (×9): 1000 mg via ORAL
  Filled 2023-12-01 (×9): qty 2

## 2023-12-01 NOTE — Progress Notes (Signed)
 [facility] Progress Note  3 Days Post-Op  Subjective: CC:  Reports incisional pain worse with deep breaths, coughing, moving. Denies nausea/ vomiting. Has had NGT clamped for 24 hours and sipping clears around the tube. +flatus. Denies BM. Has not been OOB.  Objective: Vital signs in last 24 hours: Temp:  [97.2 F (36.2 C)-99.3 F (37.4 C)] 99.3 F (37.4 C) (11/23 0800) Pulse Rate:  [88-102] 92 (11/23 0800) Resp:  [11-18] 17 (11/23 0800) BP: (92-110)/(58-80) 107/70 (11/23 0800) SpO2:  [89 %-99 %] 96 % (11/23 0800) Last BM Date :  (PTA)  Intake/Output from previous day: 11/22 0701 - 11/23 0700 In: 2477.8 [P.O.:1640; I.V.:31.9; IV Piggyback:806] Out: 400 [Urine:300; Emesis/NG output:100] Intake/Output this shift: No intake/output data recorded.  PE: Gen:  Alert, NAD, pleasant Card:  Regular rate and rhythm Pulm:  Normal effort on nasal cannula, deep inspiration limited by pain Abd: Soft, mild distention, staples c/d/I, appropriately tender, no peritonitis Skin: warm and dry, no rashes  Psych: A&Ox3   Lab Results:  Recent Labs    11/30/23 0415 12/01/23 0204  WBC 10.3 10.3  HGB 13.8 12.7  HCT 42.2 39.2  PLT 100* 89*   BMET Recent Labs    11/30/23 0415 12/01/23 0204  NA 137 134*  K 4.0 3.5  CL 104 103  CO2 23 26  GLUCOSE 193* 99  BUN 23* 16  CREATININE 0.92 0.68  CALCIUM  7.8* 7.7*   PT/INR No results for input(s): LABPROT, INR in the last 72 hours. CMP     Component Value Date/Time   NA 134 (L) 12/01/2023 0204   NA 134 05/01/2021 1532   K 3.5 12/01/2023 0204   CL 103 12/01/2023 0204   CO2 26 12/01/2023 0204   GLUCOSE 99 12/01/2023 0204   BUN 16 12/01/2023 0204   BUN 8 05/01/2021 1532   CREATININE 0.68 12/01/2023 0204   CALCIUM  7.7 (L) 12/01/2023 0204   PROT 6.9 11/28/2023 1810   PROT 7.4 05/01/2021 1532   ALBUMIN  3.5 11/28/2023 1810   ALBUMIN  4.4 05/01/2021 1532   AST 52 (H) 11/28/2023 1810   ALT 45 (H) 11/28/2023 1810    ALKPHOS 101 11/28/2023 1810   BILITOT 1.3 (H) 11/28/2023 1810   BILITOT 0.3 05/01/2021 1532   GFRNONAA >60 12/01/2023 0204   GFRAA >60 02/28/2019 2041   Lipase     Component Value Date/Time   LIPASE 24 11/28/2023 1810       Studies/Results: No results found.  Anti-infectives: Anti-infectives (From admission, onward)    Start     Dose/Rate Route Frequency Ordered Stop   11/29/23 1600  metroNIDAZOLE  (FLAGYL ) IVPB 500 mg  Status:  Discontinued        500 mg 100 mL/hr over 60 Minutes Intravenous Every 12 hours 11/29/23 0527 11/30/23 1008   11/29/23 0800  ceFEPIme  (MAXIPIME ) 2 g in sodium chloride  0.9 % 100 mL IVPB  Status:  Discontinued        2 g 200 mL/hr over 30 Minutes Intravenous Every 8 hours 11/29/23 0527 11/30/23 1008   11/28/23 2245  cefTRIAXone  (ROCEPHIN ) 2 g in sodium chloride  0.9 % 100 mL IVPB        2 g 200 mL/hr over 30 Minutes Intravenous  Once 11/28/23 2234 11/29/23 0349   11/28/23 2245  metroNIDAZOLE  (FLAGYL ) IVPB 500 mg        500 mg 100 mL/hr over 60 Minutes Intravenous  Once 11/28/23 2234 11/29/23 0502  Assessment/Plan  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 Sebastian. +flatus. D/C NGT and advance to FLD. VDRF - extubated 11/21, doing well Uncontrolled DM - management per TRH  Pain - requiring a lot of IV fentanyl  for breakthrough; add advil  400 TID and consider Toradol  tomorrow if hgb stable, increased robaxin  to 1000 mg QID, add lidoderm  patches, continue scheduled tylenol , continue PRN oxy, IV fentanyl  for breakthrough FEN - FLD, IVF per primary  DVT - SCDs, daily lovenox  ID - peri-operative, no abx at present Foley - purewick in place, recommend removal and use of bedside commode if possible - discussed with patient Dispo - per primary     LOS: 3 days   I reviewed nursing notes, hospitalist notes, last 24 h vitals and pain scores, last 48 h intake and output, last 24 h labs and trends,  and last 24 h imaging results.  MDM striaghtforward   Almarie Pringle, [facility] Please see Amion for pager number during day hours 7:00am-4:30pm

## 2023-12-01 NOTE — Plan of Care (Signed)

## 2023-12-01 NOTE — Progress Notes (Signed)
 PROGRESS NOTE    [name]  FMW:[id number] DOB: 1965-06-14 DOA: 11/28/2023 PCP: Pcp, No    Brief Narrative:  The patient is a 58 year old female with PMHx of  uncontrolled T2DM, HTN, emphysema, PE/DVT (2010), multiple remote CVAs, CKD stage III, who presented to the ED on 11/28/2023 complaining of abdominal pain, distention, nausea, vomiting, diarrhea for 5 days.  Reportedly,  family members had similar symptoms.   In the ED, T90 7.4 F, RR 16, HR 118, BP 141/97, SpO2 98% on RA.  CBG elevated to 433.  CMP showed bicarb 18, glucose 477, BUN 24, creatinine 1.23, AST 52, ALT 45, T. bili 1.3, AG 20.  Lipase 24.  CBC showed WBC 10.1, Hgb 17.8, PLT 197.  BHB elevated to 1.5.  VBG 7.35/42/23.1.  Troponin 6.  CTA chest was negative for PE, noted moderate emphysema CT abdomen pelvis showed internal hernia involving nearly the entire small bowel with marked compression of the jejunal veins and the SMV, causing venous congestion, mesenteric edema, and segmental bowel infarction with pneumatosis and portal venous gas.  Also noted significantly enlarged 5.9 cm left adrenal mass (increased from 4.3 cm), for which surgical and endocrine evaluation was recommended.  In the ED, the patient received 1.2 L bolus of LR, DKA protocol was started with IV insulin .  Rocephin  and Flagyl  were started.  General surgery was emergently consulted.  She was taken emergently to the OR for ex lap with small bowel resection  Assessment and Plan:       DVT prophylaxis: enoxaparin  (LOVENOX ) injection 40 mg Start: 11/30/23 1100 SCDs Start: 11/30/23 1003   Code Status:   Code Status: Prior  Family Communication: ***  Disposition Plan: *** PT - Follow Up Recommendations: No PT follow up OT -    DME Needs: PT equipment: None recommended by PT     Level of care: Progressive  Consultants:  ***  Procedures:  ***  Antimicrobials: ***   Subjective: ***  Objective: Vitals:   12/01/23 0336  12/01/23 0400 12/01/23 0800 12/01/23 1158  BP: 106/75 100/67 107/70 117/69  Pulse: 97 88 92 94  Resp:   17 19  Temp: 97.9 F (36.6 C)  99.3 F (37.4 C) 99.2 F (37.3 C)  TempSrc: Oral  Oral Oral  SpO2: 98% 96% 96% 98%  Weight:      Height:        Intake/Output Summary (Last 24 hours) at 12/01/2023 1559 Last data filed at 12/01/2023 1210 Gross per 24 hour  Intake 1499.49 ml  Output 300 ml  Net 1199.49 ml   Filed Weights   11/28/23 1822 11/29/23 0630 11/30/23 0630  Weight: 60.3 kg 60.7 kg 62.4 kg    Examination:  Gen: NAD, A&Ox3 HEENT: NCAT, PERRLA, EOMI Neck: Supple, no JVD CV: RRR, no murmurs Resp: normal WOB, CTAB, no w/r/r Abd: Soft, NTND, no guarding, BS normoactive Ext: No LE edema, pulses 2+ b/l Skin: Warm, dry, no rashes/lesions Neuro: CN II-XII grossly intact, strength 5/5 b/l, sensation intact Psych: Calm, cooperative, appropriate affect   Data Reviewed: I have personally reviewed following labs and imaging studies  CBC: Recent Labs  Lab 11/28/23 1810 11/28/23 1830 11/29/23 0110 11/29/23 0358 11/29/23 0811 11/30/23 0415 12/01/23 0204  WBC 10.1  --   --   --  9.3 10.3 10.3  NEUTROABS 9.0*  --   --   --   --   --   --   HGB 17.8*   < > 15.6* 14.6  15.1* 13.8 12.7  HCT 54.1*   < > 46.0 43.0 44.5 42.2 39.2  MCV 95.2  --   --   --  94.3 96.1 98.2  PLT 197  --   --   --  162 100* 89*   < > = values in this interval not displayed.   Basic Metabolic Panel: Recent Labs  Lab 11/29/23 0324 11/29/23 0358 11/29/23 0811 11/29/23 1216 11/30/23 0415 12/01/23 0204  NA 138 138 134* 138 137 134*  K 3.2* 3.7 4.2 4.4 4.0 3.5  CL 104  --  99 105 104 103  CO2 21*  --  23 22 23 26   GLUCOSE 147*  --  162* 147* 193* 99  BUN 21*  --  22* 23* 23* 16  CREATININE 0.73  --  0.77 0.89 0.92 0.68  CALCIUM  7.9*  --  7.8* 8.0* 7.8* 7.7*  MG  --   --   --  1.6* 1.8 1.9  PHOS  --   --   --  2.5 2.3* 2.8   GFR: Estimated Creatinine Clearance: 70.9 mL/min (by C-G  formula based on SCr of 0.68 mg/dL). Liver Function Tests: Recent Labs  Lab 11/28/23 1810  AST 52*  ALT 45*  ALKPHOS 101  BILITOT 1.3*  PROT 6.9  ALBUMIN  3.5   Recent Labs  Lab 11/28/23 1810  LIPASE 24   No results for input(s): AMMONIA in the last 168 hours. Coagulation Profile: No results for input(s): INR, PROTIME in the last 168 hours. Cardiac Enzymes: No results for input(s): CKTOTAL, CKMB, CKMBINDEX, TROPONINI in the last 168 hours. BNP (last 3 results) No results for input(s): PROBNP in the last 8760 hours. HbA1C: Recent Labs    11/30/23 0415  HGBA1C 9.7*   CBG: Recent Labs  Lab 11/30/23 1958 11/30/23 2349 12/01/23 0342 12/01/23 0810 12/01/23 1200  GLUCAP 84 89 100* 100* 98   Lipid Profile: No results for input(s): CHOL, HDL, LDLCALC, TRIG, CHOLHDL, LDLDIRECT in the last 72 hours. Thyroid  Function Tests: No results for input(s): TSH, T4TOTAL, FREET4, T3FREE, THYROIDAB in the last 72 hours. Anemia Panel: No results for input(s): VITAMINB12, FOLATE, FERRITIN, TIBC, IRON, RETICCTPCT in the last 72 hours. Sepsis Labs: Recent Labs  Lab 11/29/23 0943 11/29/23 1230  LATICACIDVEN 1.4 0.9    Recent Results (from the past 240 hours)  MRSA Next Gen by PCR, Nasal     Status: None   Collection Time: 11/29/23  2:18 AM   Specimen: Nasal Mucosa; Nasal Swab  Result Value Ref Range Status   MRSA by PCR Next Gen NOT DETECTED NOT DETECTED Final    Comment: (NOTE) The GeneXpert MRSA Assay (FDA approved for NASAL specimens only), is one component of a comprehensive MRSA colonization surveillance program. It is not intended to diagnose MRSA infection nor to guide or monitor treatment for MRSA infections. Test performance is not FDA approved in patients less than 60 years old. Performed at [facility], [address] [place], [place] [address]   Blood culture (routine x 2)     Status: None (Preliminary  result)   Collection Time: 11/29/23  5:22 AM   Specimen: BLOOD RIGHT HAND  Result Value Ref Range Status   Specimen Description BLOOD RIGHT HAND  Final   Special Requests   Final    BOTTLES DRAWN AEROBIC ONLY Blood Culture results may not be optimal due to an inadequate volume of blood received in culture bottles   Culture   Final  NO GROWTH 1 DAY Performed at [facility], [address], [place], [place] [address]    Report Status PENDING  Incomplete  Blood culture (routine x 2)     Status: None (Preliminary result)   Collection Time: 11/29/23  9:43 AM   Specimen: BLOOD RIGHT HAND  Result Value Ref Range Status   Specimen Description BLOOD RIGHT HAND  Final   Special Requests   Final    BOTTLES DRAWN AEROBIC ONLY Blood Culture results may not be optimal due to an inadequate volume of blood received in culture bottles   Culture   Final    NO GROWTH < 24 HOURS Performed at [facility], [address] [place], [place] [address]    Report Status PENDING  Incomplete     Radiology Studies: No results found.  Scheduled Meds:  acetaminophen   1,000 mg Oral Q6H   enoxaparin  (LOVENOX ) injection  40 mg Subcutaneous Q24H   ibuprofen   400 mg Oral TID   insulin  aspart  0-15 Units Subcutaneous TID AC & HS   lidocaine   2 patch Transdermal Q24H   methocarbamol   1,000 mg Oral QID   pantoprazole   40 mg Oral QHS   Continuous Infusions:   Unresulted Labs (From admission, onward)     Start     Ordered   12/01/23 0500  Basic metabolic panel  Daily,   R     Question:  Specimen collection method  Answer:  Unit=Unit collect   11/30/23 1008   11/30/23 0500  Magnesium   Daily,   R     Question:  Specimen collection method  Answer:  Unit=Unit collect   11/29/23 1502   11/30/23 0500  Phosphorus  Daily,   R     Question:  Specimen collection method  Answer:  Unit=Unit collect   11/29/23 1502             LOS:  LOS: 3 days   Time Spent: *** minutes  Kelliann Pendergraph Al-Sultani,  MD [facility]  If 7PM-7AM, please contact night-coverage  12/01/2023, 3:59 PM

## 2023-12-01 NOTE — Progress Notes (Signed)
 Physical Therapy Treatment Patient Details Name: [name] MRN: [id number] DOB: 11/24/1965 Today's Date: 12/01/2023   History of Present Illness Pt is a 58 y.o. female who presented 11/28/23 for abd pain, N/V/D, and elevated CBG. CT scan shows internal hernia with small bowel obstruction with possible developing bowel ischemia. 11/21 exp lap with small bowel resection; PMH: recurrent PE, CVA with Rt sided deficits, DVT, HTN    PT Comments  Patient eager to get OOB and ambulate. Comes up to long-sitting despite cues to protect her abdomen by rolling and side to sit. CGA for transfers and gait x180 ft with RW and 4L oxygen. Poor pleth while walking and therefore maintained on 4L. On return to room and sitting sats 88% on 4L. Patient moving very well. Anticipate will be ready for discharge home from a mobility standpoint as soon as medically ready.     If plan is discharge home, recommend the following: Assistance with cooking/housework;Assist for transportation;Help with stairs or ramp for entrance   Can travel by private vehicle        Equipment Recommendations  None recommended by PT    Recommendations for Other Services       Precautions / Restrictions Precautions Precautions: Fall;Other (comment) Recall of Precautions/Restrictions: Intact Precaution/Restrictions Comments: abdominal Restrictions Weight Bearing Restrictions Per Provider Order: No     Mobility  Bed Mobility Overal bed mobility: Needs Assistance Bed Mobility: Supine to Sit     Supine to sit: Modified independent (Device/Increase time)     General bed mobility comments: cued to roll and sit up,however pt pulled into long-sitting and then turned to EOB    Transfers Overall transfer level: Needs assistance Equipment used: Rolling walker (2 wheels) Transfers: Sit to/from Stand Sit to Stand: Contact guard assist           General transfer comment: no instructional cues needed; CGA for  lines/safety    Ambulation/Gait Ambulation/Gait assistance: Contact guard assist Gait Distance (Feet): 180 Feet Assistive device: Rolling walker (2 wheels) Gait Pattern/deviations: Step-through pattern, Trunk flexed   Gait velocity interpretation: 1.31 - 2.62 ft/sec, indicative of limited community ambulator   General Gait Details: flexed and pushing RW slightly too far ahead of her; no sustained improvement with cues   Stairs             Wheelchair Mobility     Tilt Bed    Modified Rankin (Stroke Patients Only)       Balance Overall balance assessment: Mild deficits observed, not formally tested (uses RW at baseline)                                          Communication Communication Communication: No apparent difficulties  Cognition Arousal: Alert Behavior During Therapy: WFL for tasks assessed/performed   PT - Cognitive impairments: No apparent impairments                         Following commands: Intact      Cueing Cueing Techniques: Verbal cues  Exercises      General Comments General comments (skin integrity, edema, etc.): on 4L throughout; poor pleth while walking; standing rest sats 100% and by return to room and seated 88%      Pertinent Vitals/Pain Pain Assessment Pain Assessment: Faces Faces Pain Scale: Hurts a little bit Pain Location: abd Pain Descriptors /  Indicators: Constant, Operative site guarding Pain Intervention(s): Limited activity within patient's tolerance, Repositioned, Patient requesting pain meds-RN notified    Home Living                          Prior Function            PT Goals (current goals can now be found in the care plan section) Acute Rehab PT Goals Patient Stated Goal: return home when medically ready Time For Goal Achievement: 12/14/23 Potential to Achieve Goals: Good Progress towards PT goals: Progressing toward goals    Frequency    Min 3X/week       PT Plan      Co-evaluation              AM-PAC PT 6 Clicks Mobility   Outcome Measure  Help needed turning from your back to your side while in a flat bed without using bedrails?: None Help needed moving from lying on your back to sitting on the side of a flat bed without using bedrails?: None Help needed moving to and from a bed to a chair (including a wheelchair)?: A Little Help needed standing up from a chair using your arms (e.g., wheelchair or bedside chair)?: A Little Help needed to walk in hospital room?: A Little Help needed climbing 3-5 steps with a railing? : A Little 6 Click Score: 20    End of Session Equipment Utilized During Treatment: Gait belt;Oxygen Activity Tolerance: Patient tolerated treatment well Patient left: in chair;with call bell/phone within reach;with chair alarm set Nurse Communication: Mobility status PT Visit Diagnosis: Unsteadiness on feet (R26.81)     Time: 1340-1403 PT Time Calculation (min) (ACUTE ONLY): 23 min  Charges:    $Gait Training: 23-37 mins PT General Charges $$ ACUTE PT VISIT: 1 Visit                       [name], PT Acute Rehabilitation Services  Office [phone]    [name] 12/01/2023, 2:53 PM

## 2023-12-01 NOTE — Progress Notes (Incomplete)
 PROGRESS NOTE    [name]  FMW:[id number] DOB: 06-22-1965 DOA: 11/28/2023 PCP: Pcp, No    Brief Narrative:  The patient is a 58 year old female with PMHx of  uncontrolled T2DM, HTN, emphysema, PE/DVT (2010), multiple remote CVAs, CKD stage III, who presented to the ED on 11/28/2023 complaining of abdominal pain, distention, nausea, vomiting, diarrhea for 5 days.  Reportedly,  family members had similar symptoms.   In the ED, T90 7.4 F, RR 16, HR 118, BP 141/97, SpO2 98% on RA.  CBG elevated to 433.  CMP showed bicarb 18, glucose 477, BUN 24, creatinine 1.23, AST 52, ALT 45, T. bili 1.3, AG 20.  Lipase 24.  CBC showed WBC 10.1, Hgb 17.8, PLT 197.  BHB elevated to 1.5.  VBG 7.35/42/23.1.  Troponin 6.  CTA chest was negative for PE, noted moderate emphysema CT abdomen pelvis showed internal hernia involving nearly the entire small bowel with marked compression of the jejunal veins and the SMV, causing venous congestion, mesenteric edema, and segmental bowel infarction with pneumatosis and portal venous gas.  Also noted significantly enlarged 5.9 cm left adrenal mass (increased from 4.3 cm), for which surgical and endocrine evaluation was recommended.  In the ED, the patient received 1.2 L bolus of LR, DKA protocol was started with IV insulin .  Rocephin  and Flagyl  were started.  General surgery was emergently consulted.  She was taken emergently to the OR for ex lap with small bowel resection and then transferred to the ICU intubated on mechanical ventilation with IV insulin  and phenylephrine , switched to Levophed . She was extubated a few hours later on 11/21 AM. She was transitioned off the insulin  drip on 11/21 PM. Levophed  was weaned off by 11/22 AM. NGT clamped 11/22 AM. The patient was downgraded to the progressive unit.  Assessment and Plan:  # SBO from internal hernia with segmental bowel infarction status post emergent ex lap with small bowel resection - POD 3 - Extubated 11/21 -  Off pressors since 11/20 2 AM - NG tube clamped 11/22.  Was tolerating CLD this morning w/o N/V. NGT removed by Surgery - Passing flatus, no BM - Pain control was an issue this morning, scheduled tylenol  and Robaxin , PRN fentanyl  and oxycodone . Surgery added ibuprofen  and lidocaine  patch - Diet advanced to full liquid - Encourage mobilization, OOB, IS - Management per General Surgery  # Mild DKA - resolved # Uncontrolled T2DM - Presented with hyperglycemia (CBG 433-477), AG 20, bicarb 18, BHB 1.5; consistent with mild DKA; treated with insulin  drip - Gap closed and insulin  drip discontinued 11/20 1 PM - Hgb A1c (11/22) - 9.7 - Continue SSI ACHS - Plan to start basal insulin  once p.o. intake is reliable and diet is advanced - ACHS CBGs - Diabetes coordinator consulted  # Postoperative ileus - No BM yet but passing gas, tolerated CLD now on FLD - Likely worsened by high burden of opioids - Continue diet advancement per surgery  # Septic shock - resolved # Concern for intra-abdominal sepsis - resolved - Initial shock likely secondary to bowel infarction. Required phenylephrine  then levophed  in the ICU - All vasopressors discontinued by 11/22 - Intra-abdominal sepsis with bowel infarction, pneumatosis, portal venous gas preoperatively now s/p resection with source tonrol - Abx discontinued per General Surgery - Afebrile, no leukocytosis  # Thrombocytopenia - Platelets trending down - Likely multifactorial secondary to postoperative consumption, inflammatory response, critical illness  - No overt bleeding - Hgb relatively stable - Trend CBC - If <  50 K, would stop Lovenox  and evaluate HIT  # Elevated LFTs - Mild AST/ALT elevation preoperatively  - Repeat LFTs tomorrow  # AKI - resolved - Creatinine at baseline   # 5.9-cm left adrenal mass  - Enlarging, previously 4.3 now 5.9 cmp - Requires outpatient endocrine evaluation for functional workup and surgical  consultation  DVT prophylaxis: enoxaparin  (LOVENOX ) injection 40 mg Start: 11/30/23 1100 SCDs Start: 11/30/23 1003   Code Status:   Code Status: Prior  Family Communication: ***  Disposition Plan: *** PT - Follow Up Recommendations: No PT follow up OT -    DME Needs: PT equipment: None recommended by PT     Level of care: Progressive  Consultants:  ***  Procedures:  ***  Antimicrobials: ***   Subjective: ***  Objective: Vitals:   12/01/23 0336 12/01/23 0400 12/01/23 0800 12/01/23 1158  BP: 106/75 100/67 107/70 117/69  Pulse: 97 88 92 94  Resp:   17 19  Temp: 97.9 F (36.6 C)  99.3 F (37.4 C) 99.2 F (37.3 C)  TempSrc: Oral  Oral Oral  SpO2: 98% 96% 96% 98%  Weight:      Height:        Intake/Output Summary (Last 24 hours) at 12/01/2023 1559 Last data filed at 12/01/2023 1210 Gross per 24 hour  Intake 1499.49 ml  Output 300 ml  Net 1199.49 ml   Filed Weights   11/28/23 1822 11/29/23 0630 11/30/23 0630  Weight: 60.3 kg 60.7 kg 62.4 kg    Examination:  Gen: NAD, A&Ox3 HEENT: NCAT, PERRLA, EOMI Neck: Supple, no JVD CV: RRR, no murmurs Resp: normal WOB, CTAB, no w/r/r Abd: Soft, NTND, no guarding, BS normoactive Ext: No LE edema, pulses 2+ b/l Skin: Warm, dry, no rashes/lesions Neuro: CN II-XII grossly intact, strength 5/5 b/l, sensation intact Psych: Calm, cooperative, appropriate affect   Data Reviewed: I have personally reviewed following labs and imaging studies  CBC: Recent Labs  Lab 11/28/23 1810 11/28/23 1830 11/29/23 0110 11/29/23 0358 11/29/23 0811 11/30/23 0415 12/01/23 0204  WBC 10.1  --   --   --  9.3 10.3 10.3  NEUTROABS 9.0*  --   --   --   --   --   --   HGB 17.8*   < > 15.6* 14.6 15.1* 13.8 12.7  HCT 54.1*   < > 46.0 43.0 44.5 42.2 39.2  MCV 95.2  --   --   --  94.3 96.1 98.2  PLT 197  --   --   --  162 100* 89*   < > = values in this interval not displayed.   Basic Metabolic Panel: Recent Labs  Lab  11/29/23 0324 11/29/23 0358 11/29/23 0811 11/29/23 1216 11/30/23 0415 12/01/23 0204  NA 138 138 134* 138 137 134*  K 3.2* 3.7 4.2 4.4 4.0 3.5  CL 104  --  99 105 104 103  CO2 21*  --  23 22 23 26   GLUCOSE 147*  --  162* 147* 193* 99  BUN 21*  --  22* 23* 23* 16  CREATININE 0.73  --  0.77 0.89 0.92 0.68  CALCIUM  7.9*  --  7.8* 8.0* 7.8* 7.7*  MG  --   --   --  1.6* 1.8 1.9  PHOS  --   --   --  2.5 2.3* 2.8   GFR: Estimated Creatinine Clearance: 70.9 mL/min (by C-G formula based on SCr of 0.68 mg/dL). Liver Function Tests: Recent Labs  Lab 11/28/23 1810  AST 52*  ALT 45*  ALKPHOS 101  BILITOT 1.3*  PROT 6.9  ALBUMIN  3.5   Recent Labs  Lab 11/28/23 1810  LIPASE 24   No results for input(s): AMMONIA in the last 168 hours. Coagulation Profile: No results for input(s): INR, PROTIME in the last 168 hours. Cardiac Enzymes: No results for input(s): CKTOTAL, CKMB, CKMBINDEX, TROPONINI in the last 168 hours. BNP (last 3 results) No results for input(s): PROBNP in the last 8760 hours. HbA1C: Recent Labs    11/30/23 0415  HGBA1C 9.7*   CBG: Recent Labs  Lab 11/30/23 1958 11/30/23 2349 12/01/23 0342 12/01/23 0810 12/01/23 1200  GLUCAP 84 89 100* 100* 98   Lipid Profile: No results for input(s): CHOL, HDL, LDLCALC, TRIG, CHOLHDL, LDLDIRECT in the last 72 hours. Thyroid  Function Tests: No results for input(s): TSH, T4TOTAL, FREET4, T3FREE, THYROIDAB in the last 72 hours. Anemia Panel: No results for input(s): VITAMINB12, FOLATE, FERRITIN, TIBC, IRON, RETICCTPCT in the last 72 hours. Sepsis Labs: Recent Labs  Lab 11/29/23 0943 11/29/23 1230  LATICACIDVEN 1.4 0.9    Recent Results (from the past 240 hours)  MRSA Next Gen by PCR, Nasal     Status: None   Collection Time: 11/29/23  2:18 AM   Specimen: Nasal Mucosa; Nasal Swab  Result Value Ref Range Status   MRSA by PCR Next Gen NOT DETECTED NOT DETECTED Final     Comment: (NOTE) The GeneXpert MRSA Assay (FDA approved for NASAL specimens only), is one component of a comprehensive MRSA colonization surveillance program. It is not intended to diagnose MRSA infection nor to guide or monitor treatment for MRSA infections. Test performance is not FDA approved in patients less than 81 years old. Performed at [facility], [address] [place], [place] [address]   Blood culture (routine x 2)     Status: None (Preliminary result)   Collection Time: 11/29/23  5:22 AM   Specimen: BLOOD RIGHT HAND  Result Value Ref Range Status   Specimen Description BLOOD RIGHT HAND  Final   Special Requests   Final    BOTTLES DRAWN AEROBIC ONLY Blood Culture results may not be optimal due to an inadequate volume of blood received in culture bottles   Culture   Final    NO GROWTH 1 DAY Performed at [facility], [address], [place], [place] [address]    Report Status PENDING  Incomplete  Blood culture (routine x 2)     Status: None (Preliminary result)   Collection Time: 11/29/23  9:43 AM   Specimen: BLOOD RIGHT HAND  Result Value Ref Range Status   Specimen Description BLOOD RIGHT HAND  Final   Special Requests   Final    BOTTLES DRAWN AEROBIC ONLY Blood Culture results may not be optimal due to an inadequate volume of blood received in culture bottles   Culture   Final    NO GROWTH < 24 HOURS Performed at [facility], [address] [place], [place] [address]    Report Status PENDING  Incomplete     Radiology Studies: No results found.  Scheduled Meds: . acetaminophen   1,000 mg Oral Q6H  . enoxaparin  (LOVENOX ) injection  40 mg Subcutaneous Q24H  . ibuprofen   400 mg Oral TID  . insulin  aspart  0-15 Units Subcutaneous TID AC & HS  . lidocaine   2 patch Transdermal Q24H  . methocarbamol   1,000 mg Oral QID  .  pantoprazole   40 mg Oral QHS   Continuous Infusions:   Unresulted Labs (From admission, onward)     Start      Ordered   12/01/23 0500  Basic metabolic panel  Daily,   R     Question:  Specimen collection method  Answer:  Unit=Unit collect   11/30/23 1008   11/30/23 0500  Magnesium   Daily,   R     Question:  Specimen collection method  Answer:  Unit=Unit collect   11/29/23 1502   11/30/23 0500  Phosphorus  Daily,   R     Question:  Specimen collection method  Answer:  Unit=Unit collect   11/29/23 1502             LOS:  LOS: 3 days   Time Spent: *** minutes  Alette Kataoka Al-Sultani, MD [facility]  If 7PM-7AM, please contact night-coverage  12/01/2023, 3:59 PM

## 2023-12-02 DIAGNOSIS — E43 Unspecified severe protein-calorie malnutrition: Secondary | ICD-10-CM | POA: Insufficient documentation

## 2023-12-02 DIAGNOSIS — R579 Shock, unspecified: Secondary | ICD-10-CM | POA: Diagnosis not present

## 2023-12-02 DIAGNOSIS — K56691 Other complete intestinal obstruction: Secondary | ICD-10-CM | POA: Diagnosis not present

## 2023-12-02 LAB — BASIC METABOLIC PANEL WITH GFR
Anion gap: 12 (ref 5–15)
BUN: 7 mg/dL (ref 6–20)
CO2: 23 mmol/L (ref 22–32)
Calcium: 7.6 mg/dL — ABNORMAL LOW (ref 8.9–10.3)
Chloride: 103 mmol/L (ref 98–111)
Creatinine, Ser: 0.62 mg/dL (ref 0.44–1.00)
GFR, Estimated: >60 mL/min (ref >60)
Glucose, Bld: 126 mg/dL — ABNORMAL HIGH (ref 70–99)
Potassium: 3.5 mmol/L (ref 3.5–5.1)
Sodium: 138 mmol/L (ref 135–145)

## 2023-12-02 LAB — COMPREHENSIVE METABOLIC PANEL WITH GFR
ALT: 21 U/L (ref 0–44)
AST: 13 U/L — ABNORMAL LOW (ref 15–41)
Albumin: 2.4 g/dL — ABNORMAL LOW (ref 3.5–5.0)
Alkaline Phosphatase: 65 U/L (ref 38–126)
Anion gap: 16 — ABNORMAL HIGH (ref 5–15)
BUN: 9 mg/dL (ref 6–20)
CO2: 20 mmol/L — ABNORMAL LOW (ref 22–32)
Calcium: 7.8 mg/dL — ABNORMAL LOW (ref 8.9–10.3)
Chloride: 95 mmol/L — ABNORMAL LOW (ref 98–111)
Creatinine, Ser: 0.73 mg/dL (ref 0.44–1.00)
GFR, Estimated: >60 mL/min (ref >60)
Glucose, Bld: 96 mg/dL (ref 70–99)
Potassium: 3.5 mmol/L (ref 3.5–5.1)
Sodium: 131 mmol/L — ABNORMAL LOW (ref 135–145)
Total Bilirubin: 1.3 mg/dL — ABNORMAL HIGH (ref 0.0–1.2)
Total Protein: 5.4 g/dL — ABNORMAL LOW (ref 6.5–8.1)

## 2023-12-02 LAB — LACTIC ACID, PLASMA
Lactic Acid, Venous: 2.1 mmol/L (ref 0.5–1.9)
Lactic Acid, Venous: 1.3 mmol/L (ref 0.5–1.9)

## 2023-12-02 LAB — CBC
HCT: 38.8 % (ref 36.0–46.0)
Hemoglobin: 13 g/dL (ref 12.0–15.0)
MCH: 31.6 pg (ref 26.0–34.0)
MCHC: 33.5 g/dL (ref 30.0–36.0)
MCV: 94.4 fL (ref 80.0–100.0)
Platelets: 132 K/uL — ABNORMAL LOW (ref 150–400)
RBC: 4.11 MIL/uL (ref 3.87–5.11)
RDW: 12.8 % (ref 11.5–15.5)
WBC: 9.4 K/uL (ref 4.0–10.5)
nRBC: 0 % (ref 0.0–0.2)

## 2023-12-02 LAB — GLUCOSE, CAPILLARY
Glucose-Capillary: 113 mg/dL — ABNORMAL HIGH (ref 70–99)
Glucose-Capillary: 143 mg/dL — ABNORMAL HIGH (ref 70–99)
Glucose-Capillary: 148 mg/dL — ABNORMAL HIGH (ref 70–99)
Glucose-Capillary: 114 mg/dL — ABNORMAL HIGH (ref 70–99)

## 2023-12-02 LAB — PHOSPHORUS: Phosphorus: 1.8 mg/dL — ABNORMAL LOW (ref 2.5–4.6)

## 2023-12-02 LAB — MAGNESIUM: Magnesium: 1.8 mg/dL (ref 1.7–2.4)

## 2023-12-02 MED ORDER — ADULT MULTIVITAMIN W/MINERALS CH
1.0000 | ORAL_TABLET | Freq: Every day | ORAL | Status: DC
  Administered 2023-12-03: 1 via ORAL
  Filled 2023-12-02: qty 1

## 2023-12-02 MED ORDER — POLYETHYLENE GLYCOL 3350 17 G PO PACK
17.0000 g | PACK | Freq: Every day | ORAL | Status: DC
  Administered 2023-12-02 – 2023-12-03 (×2): 17 g via ORAL
  Filled 2023-12-02 (×2): qty 1

## 2023-12-02 MED ORDER — THIAMINE MONONITRATE 100 MG PO TABS
100.0000 mg | ORAL_TABLET | Freq: Every day | ORAL | Status: DC
  Administered 2023-12-02 – 2023-12-03 (×2): 100 mg via ORAL
  Filled 2023-12-02 (×2): qty 1

## 2023-12-02 MED ORDER — SODIUM CHLORIDE 0.9 % IV SOLN: INTRAVENOUS | Status: DC

## 2023-12-02 MED ORDER — POTASSIUM PHOSPHATES 15 MMOLE/5ML IV SOLN
30.0000 mmol | Freq: Once | INTRAVENOUS | Status: AC
  Administered 2023-12-02: 30 mmol via INTRAVENOUS
  Filled 2023-12-02: qty 10

## 2023-12-02 NOTE — Discharge Instructions (Signed)

## 2023-12-02 NOTE — Progress Notes (Signed)
 PROGRESS NOTE    [name]  FMW:[id number] DOB: 09-13-1965 DOA: 11/28/2023 PCP: Pcp, No    Brief Narrative:  The patient is a 58 year old female with PMHx of  uncontrolled T2DM, HTN, emphysema, PE/DVT (2010), multiple remote CVAs, CKD stage III, who presented to the ED on 11/28/2023 complaining of abdominal pain, distention, nausea, vomiting, diarrhea for 5 days.  Reportedly,  family members had similar symptoms.   In the ED, T90 7.4 F, RR 16, HR 118, BP 141/97, SpO2 98% on RA.  CBG elevated to 433.  CMP showed bicarb 18, glucose 477, BUN 24, creatinine 1.23, AST 52, ALT 45, T. bili 1.3, AG 20.  Lipase 24.  CBC showed WBC 10.1, Hgb 17.8, PLT 197.  BHB elevated to 1.5.  VBG 7.35/42/23.1.  Troponin 6.  CTA chest was negative for PE, noted moderate emphysema CT abdomen pelvis showed internal hernia involving nearly the entire small bowel with marked compression of the jejunal veins and the SMV, causing venous congestion, mesenteric edema, and segmental bowel infarction with pneumatosis and portal venous gas.  Also noted significantly enlarged 5.9 cm left adrenal mass (increased from 4.3 cm), for which surgical and endocrine evaluation was recommended.  In the ED, the patient received 1.2 L bolus of LR, DKA protocol was started with IV insulin .  Rocephin  and Flagyl  were started.  General surgery was emergently consulted.  She was taken emergently to the OR for ex lap with small bowel resection and then transferred to the ICU intubated on mechanical ventilation with IV insulin  and phenylephrine , switched to Levophed . She was extubated a few hours later on 11/21 AM. She was transitioned off the insulin  drip on 11/21 PM. Levophed  was weaned off by 11/22 AM. NGT clamped 11/22 AM. The patient was downgraded to the medical floor.   Assessment and Plan:  # SBO from internal hernia with segmental bowel infarction status post emergent ex lap with small bowel resection - POD 4 - Extubated 11/21 -  Off pressors since 11/22 AM - NG tube clamped 11/22. NGT removed 11/23 - Passing flatus, no BM - Tolerating FLD, advance to soft diet after BM per surgery - Pain well-controled now. Scheduled tylenol , ibuprofen , and Robaxin , lidocaine  patch, PRN oxycodone . IV fentanyl  discontinued - Encourage mobilization, OOB, IS - Management per General Surgery  # Hypophosphatemia - Repleted  # Hyponatremia - Na 131 this morning - NS 100 mL/hr for 10 hours  # HAGMA 2/2 Lactic acidosis - Bicarb 20, AG 20 (corrected for albumin ) - Lactic acid 2.1 - NS 100 mL/hr for 10 hours  # Mild DKA - resolved # Uncontrolled T2DM - Presented with hyperglycemia (CBG 433-477), AG 20, bicarb 18, BHB 1.5; consistent with mild DKA; treated with insulin  drip - Gap closed and insulin  drip discontinued 11/20 1 PM - Hgb A1c (11/22) - 9.7 - Continue SSI ACHS - ACHS CBGs - Hypoglycemia protocol - Diabetes coordinator consulted  # Postoperative ileus - No BM yet but passing gas, tolerated CLD now on FLD - OOB - Continue diet advancement per surgery  # Acute postoperative respiratory insufficiency - improving - Remained intubated postoperatively after emergent ex lap.  Extubated 11/20 1 AM - Since extubation, required intermittent supplemental O2 via Success. Now on RA - SpO2 goal >88-92% given COPD history - Continue IS, early mobilization, multimodal pain control  # Septic shock - resolved # Concern for intra-abdominal sepsis - resolved - Initial shock likely secondary to bowel infarction. Required phenylephrine  then levophed  in the ICU -  All vasopressors discontinued by 11/22 - Intra-abdominal sepsis with bowel infarction, pneumatosis, portal venous gas preoperatively now s/p resection with source tonrol - Abx discontinued per General Surgery - Afebrile, no leukocytosis  # Thrombocytopenia - Platelets improved to 132 - Likely multifactorial secondary to postoperative consumption, inflammatory response, critical  illness  - No overt bleeding - Hgb relatively stable - Trend CBC - If < 50 K, would stop Lovenox  and evaluate HIT  # Elevated LFTs - resolved - Mild AST/ALT elevation preoperatively   # AKI - resolved - Creatinine at baseline  # 5.9-cm left adrenal mass  - Enlarging, previously 4.3 now 5.9 cmp - Requires outpatient endocrine evaluation for functional workup and surgical consultation  DVT prophylaxis: enoxaparin  (LOVENOX ) injection 40 mg Start: 11/30/23 1100 SCDs Start: 11/30/23 1003   Code Status:   Code Status: Prior  Family Communication: None  Disposition Plan: Pending clinical improvement PT - Follow Up Recommendations: No PT follow up OT -    DME Needs: PT equipment: None recommended by PT     Level of care: Progressive  Consultants:  General surgery, PCCM  Procedures:  Ex-lap with SB resection - 11/21  Antimicrobials: None   Subjective: Patient examined at bedside. Less pain this morning, did not require IV meds. No N/V. Passing flatus. No BM. Patient tearful, wants to go home to see her husband who is having health issues himself.   Later notified by RN that patient might be leaving AMA. Ultimately, spoke to patient's son, Toribio, who said him and his father would attempt to talk the patient into staying. She ultimately stayed.   Objective: Vitals:   12/01/23 2000 12/01/23 2330 12/02/23 0425 12/02/23 0739  BP: 101/71 (!) 107/56 112/74 117/74  Pulse: 99 100 96   Resp:  20  19  Temp: 97.7 F (36.5 C) 98.7 F (37.1 C) 98.8 F (37.1 C) 98.7 F (37.1 C)  TempSrc: Oral Oral Oral Oral  SpO2: 95% 92% 91% 92%  Weight:      Height:        Intake/Output Summary (Last 24 hours) at 12/02/2023 0800 Last data filed at 12/02/2023 0203 Gross per 24 hour  Intake 240 ml  Output 2450 ml  Net -2210 ml   Filed Weights   11/28/23 1822 11/29/23 0630 11/30/23 0630  Weight: 60.3 kg 60.7 kg 62.4 kg    Examination:  Gen: NAD, A&Ox3 HEENT: NCAT, EOMI Neck:  Supple, no JVD CV: RRR, no murmurs Resp: normal WOB, CTAB, no w/r/r Abd: Soft, TTP LLQ, mildly distended, no guarding, BS normoactive, honeycomb dressing overlying midline incision with some dry blood  Ext: No LE edema, pulses 2+ b/l Skin: Warm, dry, no rashes/lesions Neuro: CN II-XII grossly intact, strength 5/5 b/l, sensation intact Psych: Calm, cooperative, appropriate affect   Data Reviewed: I have personally reviewed following labs and imaging studies  CBC: Recent Labs  Lab 11/28/23 1810 11/28/23 1830 11/29/23 0358 11/29/23 0811 11/30/23 0415 12/01/23 0204 12/02/23 0220  WBC 10.1  --   --  9.3 10.3 10.3 9.4  NEUTROABS 9.0*  --   --   --   --   --   --   HGB 17.8*   < > 14.6 15.1* 13.8 12.7 13.0  HCT 54.1*   < > 43.0 44.5 42.2 39.2 38.8  MCV 95.2  --   --  94.3 96.1 98.2 94.4  PLT 197  --   --  162 100* 89* 132*   < > = values  in this interval not displayed.   Basic Metabolic Panel: Recent Labs  Lab 11/29/23 0324 11/29/23 0358 11/29/23 0811 11/29/23 1216 11/30/23 0415 12/01/23 0204 12/02/23 0220  NA 138 138 134* 138 137 134*  --   K 3.2* 3.7 4.2 4.4 4.0 3.5  --   CL 104  --  99 105 104 103  --   CO2 21*  --  23 22 23 26   --   GLUCOSE 147*  --  162* 147* 193* 99  --   BUN 21*  --  22* 23* 23* 16  --   CREATININE 0.73  --  0.77 0.89 0.92 0.68  --   CALCIUM  7.9*  --  7.8* 8.0* 7.8* 7.7*  --   MG  --   --   --  1.6* 1.8 1.9 1.8  PHOS  --   --   --  2.5 2.3* 2.8 1.8*   GFR: Estimated Creatinine Clearance: 70.9 mL/min (by C-G formula based on SCr of 0.68 mg/dL). Liver Function Tests: Recent Labs  Lab 11/28/23 1810  AST 52*  ALT 45*  ALKPHOS 101  BILITOT 1.3*  PROT 6.9  ALBUMIN  3.5   Recent Labs  Lab 11/28/23 1810  LIPASE 24   No results for input(s): AMMONIA in the last 168 hours. Coagulation Profile: No results for input(s): INR, PROTIME in the last 168 hours. Cardiac Enzymes: No results for input(s): CKTOTAL, CKMB, CKMBINDEX,  TROPONINI in the last 168 hours. BNP (last 3 results) No results for input(s): PROBNP in the last 8760 hours. HbA1C: Recent Labs    11/30/23 0415  HGBA1C 9.7*   CBG: Recent Labs  Lab 12/01/23 0810 12/01/23 1200 12/01/23 1616 12/01/23 2139 12/02/23 0559  GLUCAP 100* 98 103* 118* 113*   Lipid Profile: No results for input(s): CHOL, HDL, LDLCALC, TRIG, CHOLHDL, LDLDIRECT in the last 72 hours. Thyroid  Function Tests: No results for input(s): TSH, T4TOTAL, FREET4, T3FREE, THYROIDAB in the last 72 hours. Anemia Panel: No results for input(s): VITAMINB12, FOLATE, FERRITIN, TIBC, IRON, RETICCTPCT in the last 72 hours. Sepsis Labs: Recent Labs  Lab 11/29/23 0943 11/29/23 1230  LATICACIDVEN 1.4 0.9    Recent Results (from the past 240 hours)  MRSA Next Gen by PCR, Nasal     Status: None   Collection Time: 11/29/23  2:18 AM   Specimen: Nasal Mucosa; Nasal Swab  Result Value Ref Range Status   MRSA by PCR Next Gen NOT DETECTED NOT DETECTED Final    Comment: (NOTE) The GeneXpert MRSA Assay (FDA approved for NASAL specimens only), is one component of a comprehensive MRSA colonization surveillance program. It is not intended to diagnose MRSA infection nor to guide or monitor treatment for MRSA infections. Test performance is not FDA approved in patients less than 67 years old. Performed at [facility], [address], [place], [place] [address]   Blood culture (routine x 2)     Status: None (Preliminary result)   Collection Time: 11/29/23  5:22 AM   Specimen: BLOOD RIGHT HAND  Result Value Ref Range Status   Specimen Description BLOOD RIGHT HAND  Final   Special Requests   Final    BOTTLES DRAWN AEROBIC ONLY Blood Culture results may not be optimal due to an inadequate volume of blood received in culture bottles   Culture   Final    NO GROWTH 3 DAYS Performed at [facility], [address], [place], [place] [address]  Report Status PENDING  Incomplete  Blood culture (routine x 2)     Status: None (Preliminary result)   Collection Time: 11/29/23  9:43 AM   Specimen: BLOOD RIGHT HAND  Result Value Ref Range Status   Specimen Description BLOOD RIGHT HAND  Final   Special Requests   Final    BOTTLES DRAWN AEROBIC ONLY Blood Culture results may not be optimal due to an inadequate volume of blood received in culture bottles   Culture   Final    NO GROWTH 3 DAYS Performed at [facility], [address] [place], [place] [address]    Report Status PENDING  Incomplete     Radiology Studies: No results found.  Scheduled Meds:  acetaminophen   1,000 mg Oral Q6H   enoxaparin  (LOVENOX ) injection  40 mg Subcutaneous Q24H   ibuprofen   400 mg Oral TID   insulin  aspart  0-15 Units Subcutaneous TID AC & HS   lidocaine   2 patch Transdermal Q24H   methocarbamol   1,000 mg Oral QID   pantoprazole   40 mg Oral QHS   polyethylene glycol  17 g Oral Daily   Continuous Infusions:  potassium PHOSPHATE  IVPB (in mmol)       Unresulted Labs (From admission, onward)     Start     Ordered   12/03/23 0500  Comprehensive metabolic panel with GFR  Daily,   R     Question:  Specimen collection method  Answer:  Lab=Lab collect   12/02/23 0001   12/02/23 0500  CBC  Daily,   R     Question:  Specimen collection method  Answer:  Lab=Lab collect   12/01/23 2324             LOS:  LOS: 4 days   Time Spent: 45 minutes  Dilara Navarrete Al-Sultani, MD [facility]  If 7PM-7AM, please contact night-coverage  12/02/2023, 8:00 AM

## 2023-12-02 NOTE — Progress Notes (Signed)
 General Surgery Follow Up Note  Subjective:    Overnight Issues:   Objective:  Vital signs for last 24 hours: Temp:  [97.7 F (36.5 C)-99.3 F (37.4 C)] 98.8 F (37.1 C) (11/24 0425) Pulse Rate:  [85-100] 96 (11/24 0425) Resp:  [16-20] 20 (11/23 2330) BP: (101-117)/(56-75) 112/74 (11/24 0425) SpO2:  [91 %-100 %] 91 % (11/24 0425)  Hemodynamic parameters for last 24 hours:    Intake/Output from previous day: 11/23 0701 - 11/24 0700 In: 240 [P.O.:240] Out: 2450 [Urine:2450]  Intake/Output this shift: Total I/O In: 120 [P.O.:120] Out: 1250 [Urine:1250]  Vent settings for last 24 hours:    Physical Exam:  Gen: comfortable, no distress Neuro: follows commands, alert, communicative HEENT: PERRL Neck: supple CV: RRR Pulm: unlabored breathing on RA Abd: soft, NT, incision clean, dry, intact  GU: urine clear and yellow, +spontaneous void Extr: wwp, no edema  Results for orders placed or performed during the hospital encounter of 11/28/23 (from the past 24 hours)  Glucose, capillary     Status: Abnormal   Collection Time: 12/01/23  8:10 AM  Result Value Ref Range   Glucose-Capillary 100 (H) 70 - 99 mg/dL  Glucose, capillary     Status: None   Collection Time: 12/01/23 12:00 PM  Result Value Ref Range   Glucose-Capillary 98 70 - 99 mg/dL  Glucose, capillary     Status: Abnormal   Collection Time: 12/01/23  4:16 PM  Result Value Ref Range   Glucose-Capillary 103 (H) 70 - 99 mg/dL  Glucose, capillary     Status: Abnormal   Collection Time: 12/01/23  9:39 PM  Result Value Ref Range   Glucose-Capillary 118 (H) 70 - 99 mg/dL  Magnesium      Status: None   Collection Time: 12/02/23  2:20 AM  Result Value Ref Range   Magnesium  1.8 1.7 - 2.4 mg/dL  Phosphorus     Status: Abnormal   Collection Time: 12/02/23  2:20 AM  Result Value Ref Range   Phosphorus 1.8 (L) 2.5 - 4.6 mg/dL  CBC     Status: Abnormal   Collection Time: 12/02/23  2:20 AM  Result Value Ref Range    WBC 9.4 4.0 - 10.5 K/uL   RBC 4.11 3.87 - 5.11 MIL/uL   Hemoglobin 13.0 12.0 - 15.0 g/dL   HCT 61.1 63.9 - 53.9 %   MCV 94.4 80.0 - 100.0 fL   MCH 31.6 26.0 - 34.0 pg   MCHC 33.5 30.0 - 36.0 g/dL   RDW 87.1 88.4 - 84.4 %   Platelets 132 (L) 150 - 400 K/uL   nRBC 0.0 0.0 - 0.2 %  Glucose, capillary     Status: Abnormal   Collection Time: 12/02/23  5:59 AM  Result Value Ref Range   Glucose-Capillary 113 (H) 70 - 99 mg/dL    Assessment & Plan:  Present on Admission:  Bowel obstruction (HCC)    LOS: 4 days   Additional comments:I reviewed the patient's new clinical lab test results.   and I reviewed the patients new imaging test results.    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 Sebastian. +flatus, no BM. Tol FLD without n/v VDRF - extubated 11/21, doing well Uncontrolled DM - management per TRH   Pain - improved, required no IV pain meds since yest AM, d/c order  FEN - FLD, IVF per primary, advance to soft after BM DVT - SCDs,  daily lovenox  ID - peri-operative, no abx at present Foley - purewick in place, recommend removal and use of bedside commode if possible - discussed with patient Dispo - per primary     Dreama GEANNIE Hanger, MD Trauma & General Surgery Please use [email or web address] to contact on call provider  12/02/2023  *Care during the described time interval was provided by me. I have reviewed this patient's available data, including medical history, events of note, physical examination and test results as part of my evaluation.

## 2023-12-02 NOTE — Progress Notes (Signed)
 Nutrition Follow-up  DOCUMENTATION CODES:   Severe malnutrition in context of acute illness/injury  INTERVENTION:  Add Magic cup TID with meals, each supplement provides 290 kcal and 9 grams of protein    Monitor magnesium , potassium, and phosphorus daily for at least 1-2 more days, MD to replete as needed, as pt is at risk for refeeding syndrome given severe malnutrition dx and inability to discern intake PTA.    Add Thiamine  100 mg daily for 5 days  Add MVI w/ minerals  NUTRITION DIAGNOSIS:   Severe Malnutrition related to acute illness as evidenced by severe muscle depletion, moderate fat depletion.  GOAL:  Patient will meet greater than or equal to 90% of their needs  MONITOR:  Diet advancement, Labs, Weight trends  REASON FOR ASSESSMENT:  Ventilator    ASSESSMENT:   Pt with PMH significant for: T2DM, HTN, PE, stroke, and depression. Presented with abdominal pain, distention, and N/V/D x5 days. Found to have internal hernia w/ bowel ischemia. Emergently taken to OR for resection.  11/20 - admitted 11/21 - OR(exlap): LOA and small bowel resection; extubated 11/22 - advanced to clear liquid diet 11/23 - advanced to full liquid diet; NGT removed   Patient found to have single band of adhesion causing small bowel obstruction and internal hernia obstructing the distal ileum approximately 15cm from the ileocecal valve. NGT removed over the weekend and diet advanced. Small bowel movement this morning noted. On full liquids with guidance from surgery to advance to soft when patient has had a bowel movement.   Average Meal Intake 11/24: 100% x1 documented meal  Met with patient at bedside this morning. Lunch tray at bedside untouched, but had just been delivered. She is unwilling to engage in conversation with RD and answers in short phrases, usually consisting of nope or I don't care. Unable to obtain detailed nutrition history. Discussed appetite. She reports not eating  much, but will not share any items that sound good to her or she would like. Discussed use of ONS, which she states she will not accept. Is documented with 100% consumption of breakfast this morning. This consisted of: vanilla ice cream, grits, 2% milk, grape juice and coffee. Will add vanilla Magic Cup and re-assess for willingness to receive on follow up assessment.  Verbalizes frustration around MD denying her request for PureWick catheter. Also becomes tearful as she would like to be discharged by Thanksgiving. Her husband is in a facility with liver cirrhosis and she wants to be able to take care of him. Discussed importance of eating enough to facilitate progress toward discharge to which she states, I don't care anymore.    Admit Weight: 60.3 kg Current Weight: 62.4 kg   Weight with slight trend up this admission. Continue to monitor trend. No significant edema noted, but unable to assess this morning. Unable to complete repeat NFPE at time of bedside visit due to patient refusal. Will re-assess at next bedside visit. While she currently meets criteria for severe malnutrition in the context of acute illness, she may be more appropriate for severe designation in context of chronic illness with repeat exam and when nutrition history obtained. UOP good w/ 2.4L noted in last 24 hours.   Sodium low. Receiving phosphorus repletion today. Has not required dextrose  in last two days, but also not receiving insulin  as parameters for administration not met. Magnesium  found to be low on 11/21 with add-on draw. Has required potassium, phosphorus, and magnesium  repletion this admission. Thiamine  added now that  diet advanced. Continue to monitor electrolytes.  Meds: SS Novolog , pantoprazole , Miralax , K PHOS  x1  Labs:  Na+ 131 (L) K+ 3.5 (wdl) PHOS 1.8 (L) Mg 1.8 (wdl) Hgb 15.1 (H) CBGs 147-553 x24 hours A1c 9.7 (11/2023)  Diet Order:   Diet Order             Diet full liquid Fluid consistency:  Thin  Diet effective now            EDUCATION NEEDS:  Not appropriate for education at this time  Skin:  Skin Assessment: Reviewed RN Assessment  Last BM:  PTA  Height:  Ht Readings from Last 1 Encounters:  11/29/23 5' 6 (1.676 m)   Weight:  Wt Readings from Last 1 Encounters:  11/30/23 62.4 kg    Ideal Body Weight:  59.1 kg  BMI:  Body mass index is 22.2 kg/m.  Estimated Nutritional Needs:   Kcal:  1700-1900 kcals  Protein:  90-105g  Fluid:  1.7-1.9L/day  Blair Deaner MS, RD, LDN Registered Dietitian Clinical Nutrition RD Inpatient Contact Info in Amion

## 2023-12-02 NOTE — Plan of Care (Signed)

## 2023-12-02 NOTE — TOC Initial Note (Signed)
 Transition of Care [facility]) - Initial/Assessment Note    Patient Details  Name: [name] MRN: [id number] Date of Birth: 12/17/1965  Transition of Care [facility]) CM/SW Contact:    Lauraine FORBES Saa, LCSWA Phone Number: 12/02/2023, 3:46 PM  Clinical Narrative:                  3:47 PM CSW introduced self and role to patient. Patient confirmed that she does not have a PCP and expressed interest in becoming establish with a PCP. Patient expressed preference in PCP appointments at 2:00PM or 2:30PM any day during the week. CSW made RNCM aware. Per chart review, therapy did not have discharge recommendations for patient. Patient resides at home with spouse and other relative(s). Patient has insurance and does not have SNF/HH history but has DME (cane, walker, shower seat) history. Patient's preferred pharmacy is [facility] [phone] [place]. TOC will continue to follow.  Expected Discharge Plan: Home/Self Care Barriers to Discharge: Continued Medical Work up   Patient Goals and CMS Choice Patient states their goals for this hospitalization and ongoing recovery are:: to return home          Expected Discharge Plan and Services   Discharge Planning Services: CM Consult   Living arrangements for the past 2 months: Single Family Home                                      Prior Living Arrangements/Services Living arrangements for the past 2 months: Single Family Home Lives with:: Spouse, Relatives Patient language and need for interpreter reviewed:: Yes Do you feel safe going back to the place where you live?: Yes      Need for Family Participation in Patient Care: No (Comment) Care giver support system in place?: Yes (comment) Current home services: DME Criminal Activity/Legal Involvement Pertinent to Current Situation/Hospitalization: No - Comment as needed  Activities of Daily Living   ADL Screening (condition at time of admission) Independently performs  ADLs?: Yes (appropriate for developmental age) Is the patient deaf or have difficulty hearing?: No Does the patient have difficulty seeing, even when wearing glasses/contacts?: No Does the patient have difficulty concentrating, remembering, or making decisions?: No  Permission Sought/Granted Permission sought to share information with : Family Supports Permission granted to share information with : No (Contact information on chart)  Share Information with NAME: Corena Tilson     Permission granted to share info w Relationship: Spouse  Permission granted to share info w Contact Information: [phone]  Emotional Assessment Appearance:: Appears stated age Attitude/Demeanor/Rapport: Engaged Affect (typically observed): Accepting, Appropriate, Adaptable, Calm, Stable, Pleasant Orientation: : Oriented to Self, Oriented to Place, Oriented to  Time, Oriented to Situation Alcohol / Substance Use: Not Applicable Psych Involvement: No (comment)  Admission diagnosis:  Bowel obstruction (HCC) [K56.609] Small bowel obstruction (HCC) [K56.609] Diabetic ketoacidosis without coma associated with type 2 diabetes mellitus (HCC) [E11.10] Patient Active Problem List   Diagnosis Date Noted   Protein-calorie malnutrition, severe 12/02/2023   Malnutrition of moderate degree 11/30/2023   Shock (HCC) 11/29/2023   Bowel obstruction (HCC) 11/28/2023   Sepsis (HCC) 03/20/2022   Pressure injury of skin 03/28/2021   SIRS (systemic inflammatory response syndrome) (HCC) 03/24/2021   Type 2 diabetes mellitus with hyperglycemia (HCC) 03/24/2021   Nausea vomiting and diarrhea 03/24/2021   Recurrent pulmonary embolism (HCC) 03/24/2021   Chest pain 03/24/2021   CKD (  chronic kidney disease), stage III (HCC) 05/17/2016   Type 2 diabetes mellitus with vascular disease (HCC) 05/17/2016   Essential hypertension 05/17/2016   DVT (deep venous thrombosis) (HCC) 05/16/2016   Chronic ischemic left PCA stroke  07/30/2013   TIA (transient ischemic attack) 07/30/2013   Posterior circulation stroke of uncertain pathology (HCC) 07/30/2013   PCP:  Freddrick No Pharmacy:   [facility] [address] [place] - [address] [place] [address] Phone: [phone] Fax: [phone]     Social Drivers of Health (SDOH) Social History: SDOH Screenings   Food Insecurity: Patient Unable To Answer (11/29/2023)  Housing: Unknown (11/29/2023)  Transportation Needs: No Transportation Needs (03/21/2022)  Utilities: Not At Risk (03/21/2022)  Depression (PHQ2-9): High Risk (05/01/2021)  Tobacco Use: High Risk (11/28/2023)   SDOH Interventions:     Readmission Risk Interventions    03/22/2022    4:41 PM  Readmission Risk Prevention Plan  Post Dischage Appt Complete  Medication Screening Complete  Transportation Screening Complete

## 2023-12-02 NOTE — Plan of Care (Signed)
   Problem: Health Behavior/Discharge Planning: Goal: Ability to manage health-related needs will improve Outcome: Progressing

## 2023-12-02 NOTE — Inpatient Diabetes Management (Addendum)
 Inpatient Diabetes Program Recommendations  AACE/ADA: New Consensus Statement on Inpatient Glycemic Control (2015)  Target Ranges:  Prepandial:   less than 140 mg/dL      Peak postprandial:   less than 180 mg/dL (1-2 hours)      Critically ill patients:  140 - 180 mg/dL   Lab Results  Component Value Date   GLUCAP 113 (H) 12/02/2023   HGBA1C 9.7 (H) 11/30/2023    Review of Glycemic Control  Latest Reference Range & Units 12/01/23 03:42 12/01/23 08:10 12/01/23 12:00 12/01/23 16:16 12/01/23 21:39 12/02/23 05:59  Glucose-Capillary 70 - 99 mg/dL 899 (H) 899 (H) 98 896 (H) 118 (H) 113 (H)   Diabetes history:  DM 2 Outpatient Diabetes medications:  Previously was on 70/30- 12 units bid Current orders for Inpatient glycemic control:  Novolog  0-15 units tid with meals and HS  Inpatient Diabetes Program Recommendations:    Will speak to patient regarding A1C of 9.7%.  Unclear if patient was taking insulin  prior to admit?  Addendum: 1430 Spoke to patient regarding elevated A1C.  She states that she was not taking insulin  prior to admit but has taken it in the past.  We discussed elevated A1c and need for insulin  at discharge.  Patient verbalized understanding.  At this point, blood sugars are well controlled.  Patient understands that she needs control of CBG's and also needs close f/u with PCP.    Thanks,  Randall Bullocks, RN, BC-ADM Inpatient Diabetes Coordinator Pager [phone]  (8a-5p)

## 2023-12-02 NOTE — Progress Notes (Signed)
 Mobility Specialist Progress Note;   12/02/23 0940  Mobility  Activity Ambulated with assistance (from bathroom)  Level of Assistance Contact guard assist, steadying assist  Assistive Device Front wheel walker  Distance Ambulated (ft) 10 ft  Activity Response Tolerated well  Mobility Referral Yes  Mobility visit 1 Mobility  Mobility Specialist Start Time (ACUTE ONLY) 0940  Mobility Specialist Stop Time (ACUTE ONLY) 0947  Mobility Specialist Time Calculation (min) (ACUTE ONLY) 7 min   Received pt in bathroom. Deferred hallway ambulation however agreeable to ambulate in room. Required MinG assistance to safely ambulate in room. Pt on RA throughout, portable pulse ox read 99%, not sure accuracy. RN states pt has been on RA throughout the morning. Pt in bed, left with all needs met. RN in room.   Lauraine Erm Mobility Specialist Please contact via SecureChat or [employer] [phone]

## 2023-12-02 NOTE — TOC Progression Note (Signed)
 Transition of Care [facility]) - Progression Note    Patient Details  Name: [name] MRN: [id number] Date of Birth: May 21, 1965  [facility] CM/SW Contact  [name], RN Phone Number: 12/02/2023, 3:48 PM  Clinical Narrative:    Sent referral to CMA to make pcp apt.   Expected Discharge Plan: Home/Self Care Barriers to Discharge: Continued Medical Work up               Expected Discharge Plan and Services   Discharge Planning Services: CM Consult   Living arrangements for the past 2 months: Single Family Home                                       Social Drivers of Health (SDOH) Interventions SDOH Screenings   Food Insecurity: Patient Unable To Answer (11/29/2023)  Housing: Unknown (11/29/2023)  Transportation Needs: No Transportation Needs (03/21/2022)  Utilities: Not At Risk (03/21/2022)  Depression (PHQ2-9): High Risk (05/01/2021)  Tobacco Use: High Risk (11/28/2023)    Readmission Risk Interventions    03/22/2022    4:41 PM  Readmission Risk Prevention Plan  Post Dischage Appt Complete  Medication Screening Complete  Transportation Screening Complete

## 2023-12-03 DIAGNOSIS — K56691 Other complete intestinal obstruction: Secondary | ICD-10-CM | POA: Diagnosis not present

## 2023-12-03 DIAGNOSIS — D3502 Benign neoplasm of left adrenal gland: Secondary | ICD-10-CM | POA: Diagnosis present

## 2023-12-03 LAB — COMPREHENSIVE METABOLIC PANEL WITH GFR
ALT: 17 U/L (ref 0–44)
AST: 16 U/L (ref 15–41)
Albumin: 2.5 g/dL — ABNORMAL LOW (ref 3.5–5.0)
Alkaline Phosphatase: 74 U/L (ref 38–126)
Anion gap: 12 (ref 5–15)
BUN: 6 mg/dL (ref 6–20)
CO2: 22 mmol/L (ref 22–32)
Calcium: 8 mg/dL — ABNORMAL LOW (ref 8.9–10.3)
Chloride: 105 mmol/L (ref 98–111)
Creatinine, Ser: 0.62 mg/dL (ref 0.44–1.00)
GFR, Estimated: >60 mL/min (ref >60)
Glucose, Bld: 110 mg/dL — ABNORMAL HIGH (ref 70–99)
Potassium: 3.6 mmol/L (ref 3.5–5.1)
Sodium: 139 mmol/L (ref 135–145)
Total Bilirubin: 0.8 mg/dL (ref 0.0–1.2)
Total Protein: 5.4 g/dL — ABNORMAL LOW (ref 6.5–8.1)

## 2023-12-03 LAB — GLUCOSE, CAPILLARY
Glucose-Capillary: 124 mg/dL — ABNORMAL HIGH (ref 70–99)
Glucose-Capillary: 94 mg/dL (ref 70–99)

## 2023-12-03 LAB — BASIC METABOLIC PANEL WITH GFR
Anion gap: 12 (ref 5–15)
BUN: 7 mg/dL (ref 6–20)
CO2: 23 mmol/L (ref 22–32)
Calcium: 7.8 mg/dL — ABNORMAL LOW (ref 8.9–10.3)
Chloride: 104 mmol/L (ref 98–111)
Creatinine, Ser: 0.62 mg/dL (ref 0.44–1.00)
GFR, Estimated: >60 mL/min (ref >60)
Glucose, Bld: 128 mg/dL — ABNORMAL HIGH (ref 70–99)
Potassium: 3.6 mmol/L (ref 3.5–5.1)
Sodium: 139 mmol/L (ref 135–145)

## 2023-12-03 LAB — CBC
HCT: 43.3 % (ref 36.0–46.0)
Hemoglobin: 14.6 g/dL (ref 12.0–15.0)
MCH: 31.7 pg (ref 26.0–34.0)
MCHC: 33.7 g/dL (ref 30.0–36.0)
MCV: 94.1 fL (ref 80.0–100.0)
Platelets: 170 K/uL (ref 150–400)
RBC: 4.6 MIL/uL (ref 3.87–5.11)
RDW: 13.2 % (ref 11.5–15.5)
WBC: 10.6 K/uL — ABNORMAL HIGH (ref 4.0–10.5)
nRBC: 0 % (ref 0.0–0.2)

## 2023-12-03 LAB — MAGNESIUM
Magnesium: 1.6 mg/dL — ABNORMAL LOW (ref 1.7–2.4)
Magnesium: 1.8 mg/dL (ref 1.7–2.4)

## 2023-12-03 LAB — PHOSPHORUS: Phosphorus: 2.3 mg/dL — ABNORMAL LOW (ref 2.5–4.6)

## 2023-12-03 LAB — TROPONIN I (HIGH SENSITIVITY): Troponin I (High Sensitivity): 6 ng/L (ref <18)

## 2023-12-03 MED ORDER — METFORMIN HCL 500 MG PO TABS: 500.0000 mg | ORAL_TABLET | Freq: Two times a day (BID) | ORAL | 0 refills | Status: AC

## 2023-12-03 MED ORDER — K PHOS MONO-SOD PHOS DI & MONO 155-852-130 MG PO TABS
500.0000 mg | ORAL_TABLET | Freq: Once | ORAL | Status: AC
  Administered 2023-12-03: 500 mg via ORAL
  Filled 2023-12-03: qty 2

## 2023-12-03 MED ORDER — BLOOD GLUCOSE TEST VI STRP: 1.0000 | ORAL_STRIP | 0 refills | Status: DC

## 2023-12-03 MED ORDER — PANTOPRAZOLE SODIUM 40 MG PO TBEC: 40.0000 mg | DELAYED_RELEASE_TABLET | Freq: Every day | ORAL | 0 refills | Status: AC

## 2023-12-03 MED ORDER — METHOCARBAMOL 750 MG PO TABS: 750.0000 mg | ORAL_TABLET | Freq: Four times a day (QID) | ORAL | 0 refills | Status: AC

## 2023-12-03 MED ORDER — POTASSIUM CHLORIDE CRYS ER 20 MEQ PO TBCR
40.0000 meq | EXTENDED_RELEASE_TABLET | Freq: Once | ORAL | Status: AC
  Administered 2023-12-03: 40 meq via ORAL
  Filled 2023-12-03: qty 2

## 2023-12-03 MED ORDER — IBUPROFEN 400 MG PO TABS: 400.0000 mg | ORAL_TABLET | Freq: Four times a day (QID) | ORAL | 0 refills | Status: DC | PRN

## 2023-12-03 MED ORDER — POLYETHYLENE GLYCOL 3350 17 G PO PACK: 17.0000 g | PACK | Freq: Every day | ORAL | 0 refills | Status: AC | PRN

## 2023-12-03 MED ORDER — LIVING WELL WITH DIABETES BOOK
Freq: Once | Status: AC
  Filled 2023-12-03: qty 1

## 2023-12-03 MED ORDER — ADULT MULTIVITAMIN W/MINERALS CH: 1.0000 | ORAL_TABLET | Freq: Every day | ORAL | 0 refills | Status: AC

## 2023-12-03 MED ORDER — BLOOD GLUCOSE MONITORING SUPPL DEVI: 1.0000 | 0 refills | Status: DC

## 2023-12-03 MED ORDER — METHOCARBAMOL 1000 MG PO TABS: 1000.0000 mg | ORAL_TABLET | Freq: Four times a day (QID) | ORAL | 0 refills | Status: DC

## 2023-12-03 MED ORDER — ACETAMINOPHEN 500 MG PO TABS: 1000.0000 mg | ORAL_TABLET | Freq: Four times a day (QID) | ORAL | 0 refills | Status: AC | PRN

## 2023-12-03 MED ORDER — LANCETS MISC: 1.0000 | 0 refills | Status: DC

## 2023-12-03 MED ORDER — VITAMIN B-1 100 MG PO TABS: 100.0000 mg | ORAL_TABLET | Freq: Every day | ORAL | 0 refills | Status: AC

## 2023-12-03 MED ORDER — LANCET DEVICE MISC: 1.0000 | 0 refills | Status: DC

## 2023-12-03 MED ORDER — OXYCODONE HCL 5 MG PO TABS
5.0000 mg | ORAL_TABLET | Freq: Four times a day (QID) | ORAL | 0 refills | Status: AC | PRN
Start: 2023-12-03 — End: 2023-12-08

## 2023-12-03 NOTE — Progress Notes (Signed)
 Physical Therapy Treatment Patient Details Name: [name] MRN: [id number] DOB: [birth year] Today's Date: 12/03/2023   History of Present Illness 58 y.o. F adm 11/28/23 for abd pain, N/V/D,  internal hernia with SBO. 11/21 exp lap with small bowel resection. PMH: recurrent PE, CVA with Rt sided deficits, DVT, HTN    PT Comments  Pt pleasant and slow to initiate mobility reporting fatigue and pain but eager to progress to return home. Pt states she has family assist for stairs as she is very afraid of heights. Pt reliant on RW and able to increase gait distance, transfers and up to chair. Encouraged continued mobility for strength and bowel function. Pt mobilizing appropriately for return home.     If plan is discharge home, recommend the following: Assistance with cooking/housework;Assist for transportation;Help with stairs or ramp for entrance   Can travel by private vehicle        Equipment Recommendations  None recommended by PT    Recommendations for Other Services       Precautions / Restrictions Precautions Precautions: Fall;Other (comment) Recall of Precautions/Restrictions: Intact Precaution/Restrictions Comments: abdominal incision     Mobility  Bed Mobility Overal bed mobility: Modified Independent Bed Mobility: Rolling, Sidelying to Sit Rolling: Supervision, Used rails         General bed mobility comments: used rail with HOB 40 degrees to roll to right and rise to sitting, pt able to don slippers EOB    Transfers Overall transfer level: Needs assistance   Transfers: Sit to/from Stand Sit to Stand: Contact guard assist           General transfer comment: cues for hand placement and not ditching AD. Pt rose from bed x 2 , toilet and to recliner    Ambulation/Gait Ambulation/Gait assistance: Supervision Gait Distance (Feet): 300 Feet Assistive device: Rolling walker (2 wheels) Gait Pattern/deviations: Step-through pattern, Trunk flexed    Gait velocity interpretation: 1.31 - 2.62 ft/sec, indicative of limited community ambulator   General Gait Details: flexed and pushing RW slightly too far ahead of her; no sustained improvement with cues, pt refused adjusting RW height   Stairs Stairs: Yes Stairs assistance: Supervision Stair Management: One rail Right, Step to pattern Number of Stairs: 1 General stair comments: pt able to ascend one step with rail, adamantly denied attempting 3 steps stating I'm deathly afraid of heights. Pt states family helps her up stairs at entry and she always avoids steps and elevators   Wheelchair Mobility     Tilt Bed    Modified Rankin (Stroke Patients Only)       Balance Overall balance assessment: Mild deficits observed, not formally tested                                          Communication Communication Communication: No apparent difficulties  Cognition Arousal: Alert Behavior During Therapy: WFL for tasks assessed/performed   PT - Cognitive impairments: No apparent impairments                         Following commands: Intact      Cueing Cueing Techniques: Verbal cues  Exercises      General Comments        Pertinent Vitals/Pain Pain Assessment Pain Assessment: 0-10 Pain Score: 6  Pain Location: incision Pain Descriptors / Indicators: Constant, Operative site guarding Pain  Intervention(s): Limited activity within patient's tolerance, Monitored during session, Patient requesting pain meds-RN notified, Repositioned    Home Living                          Prior Function            PT Goals (current goals can now be found in the care plan section) Progress towards PT goals: Progressing toward goals    Frequency    Min 2X/week      PT Plan      Co-evaluation              AM-PAC PT 6 Clicks Mobility   Outcome Measure  Help needed turning from your back to your side while in a flat bed without  using bedrails?: None Help needed moving from lying on your back to sitting on the side of a flat bed without using bedrails?: None Help needed moving to and from a bed to a chair (including a wheelchair)?: A Little Help needed standing up from a chair using your arms (e.g., wheelchair or bedside chair)?: A Little Help needed to walk in hospital room?: A Little Help needed climbing 3-5 steps with a railing? : A Little 6 Click Score: 20    End of Session   Activity Tolerance: Patient tolerated treatment well Patient left: in chair;with call bell/phone within reach;with chair alarm set;with nursing/sitter in room Nurse Communication: Mobility status PT Visit Diagnosis: Other abnormalities of gait and mobility (R26.89)     Time: 8940-8874 PT Time Calculation (min) (ACUTE ONLY): 26 min  Charges:    $Gait Training: 8-22 mins $Therapeutic Activity: 8-22 mins PT General Charges $$ ACUTE PT VISIT: 1 Visit                     Lenoard SQUIBB, PT Acute Rehabilitation Services Office: [phone]    Lenoard NOVAK Jermond Burkemper 12/03/2023, 12:50 PM

## 2023-12-03 NOTE — Progress Notes (Signed)
 5 Days Post-Op   Subjective/Chief Complaint: PT doing well tol PO  + BMs   Objective: Vital signs in last 24 hours: Temp:  [98.2 F (36.8 C)-99.2 F (37.3 C)] 98.2 F (36.8 C) (11/25 0410) Pulse Rate:  [97-103] 103 (11/25 0410) Resp:  [18-20] 20 (11/25 0410) BP: (98-133)/(56-94) 116/69 (11/25 0410) SpO2:  [92 %-100 %] 96 % (11/25 0410) Last BM Date : 12/02/23  Intake/Output from previous day: 11/24 0701 - 11/25 0700 In: 903 [P.O.:660; IV Piggyback:243] Out: 800 [Urine:800] Intake/Output this shift: No intake/output data recorded.  General appearance: alert and cooperative GI: soft, non-tender; bowel sounds normal; no masses,  no organomegaly and inc c/d/i  Lab Results:  Recent Labs    12/02/23 0220 12/03/23 0452  WBC 9.4 10.6*  HGB 13.0 14.6  HCT 38.8 43.3  PLT 132* 170   BMET Recent Labs    12/02/23 2347 12/03/23 0452  NA 139 139  K 3.6 3.6  CL 104 105  CO2 23 22  GLUCOSE 128* 110*  BUN 7 6  CREATININE 0.62 0.62  CALCIUM  7.8* 8.0*   PT/INR No results for input(s): LABPROT, INR in the last 72 hours. ABG No results for input(s): PHART, HCO3 in the last 72 hours.  Invalid input(s): PCO2, PO2  Studies/Results: No results found.  Anti-infectives: Anti-infectives (From admission, onward)    Start     Dose/Rate Route Frequency Ordered Stop   11/29/23 1600  metroNIDAZOLE  (FLAGYL ) IVPB 500 mg  Status:  Discontinued        500 mg 100 mL/hr over 60 Minutes Intravenous Every 12 hours 11/29/23 0527 11/30/23 1008   11/29/23 0800  ceFEPIme  (MAXIPIME ) 2 g in sodium chloride  0.9 % 100 mL IVPB  Status:  Discontinued        2 g 200 mL/hr over 30 Minutes Intravenous Every 8 hours 11/29/23 0527 11/30/23 1008   11/28/23 2245  cefTRIAXone  (ROCEPHIN ) 2 g in sodium chloride  0.9 % 100 mL IVPB        2 g 200 mL/hr over 30 Minutes Intravenous  Once 11/28/23 2234 11/29/23 0349   11/28/23 2245  metroNIDAZOLE  (FLAGYL ) IVPB 500 mg        500 mg 100 mL/hr  over 60 Minutes Intravenous  Once 11/28/23 2234 11/29/23 0502       Assessment/Plan: 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 Sebastian. +flatus, + BM. Tol soft without n/v VDRF - extubated 11/21, doing well Uncontrolled DM - management per TRH   Pain - improved, required no IV pain meds since yest AM, d/c order  FEN - SOFT , IVF per primary, advance to reg diet DVT - SCDs, daily lovenox  ID - peri-operative, no abx at present Foley - purewick in place, recommend removal and use of bedside commode if possible - discussed with patient Dispo - per primary  but OK for home per surgery  Will need f/u next week for staple removal  LOS: 5 days    [name] 12/03/2023

## 2023-12-03 NOTE — Inpatient Diabetes Management (Addendum)
 Inpatient Diabetes Program Recommendations  AACE/ADA: New Consensus Statement on Inpatient Glycemic Control (2015)  Target Ranges:  Prepandial:   less than 140 mg/dL      Peak postprandial:   less than 180 mg/dL (1-2 hours)      Critically ill patients:  140 - 180 mg/dL   Lab Results  Component Value Date   GLUCAP 94 12/03/2023   HGBA1C 9.7 (H) 11/30/2023    Review of Glycemic Control  Diabetes history: DM2 Outpatient Diabetes medications: none; had previously been on 70/30 12 units BID Current orders for Inpatient glycemic control: Novolog  0-15 units correction scale TID & HS  Inpatient Diabetes Program Recommendations:   Spoke with patient at the bedside. Patient states that she does need a home blood glucose meter, strips, and lancets. She has not taken insulin  for several years. Her blood sugars here in the hospital have been fairly normal and has not required much insulin . She does have a PCP appointment scheduled for after discharge. Added references on diet, care of type 2 diabetes to discharge papers.  She will probably need some diet instruction with PCP. Ordered Living Well with Diabetes booklet for patient from pharmacy.   Discharge Recommendations: Other recommendations: Metformin  500 mg BID, follow up with PCP Supply/Referral recommendations: Glucometer Test strips Lancet device Lancets   Use Adult Diabetes Insulin  Treatment Post Discharge order set.  Marjorie Lunger RN BSN CDE Diabetes Coordinator Pager: [phone]  8am-5pm

## 2023-12-03 NOTE — Discharge Summary (Signed)
 Physician Discharge Summary   Patient: [name] MRN: [id number] DOB: [birth year]  Admit date:     11/28/2023  Discharge date: 12/03/23  Discharge Physician: Duffy Al-Sultani   PCP: Pcp, No   Recommendations at discharge:   Follow up with PCP for management of T2DM and HTN Follow up with general surgery for routine post-op evaluation Follow up with endocrinology for close management of T2DM and further evaluation of the adrenal mass  Discharge Diagnoses: Principal Problem:   Bowel obstruction (HCC) Active Problems:   Shock (HCC)   Malnutrition of moderate degree   Protein-calorie malnutrition, severe   Adrenal adenoma, left  Resolved Problems:   * No resolved hospital problems. [facility] Course: The patient is a 58 year old female with PMHx of  uncontrolled T2DM, HTN, emphysema, PE/DVT (2010), multiple remote CVAs, CKD stage III, who presented to the ED on 11/28/2023 complaining of abdominal pain, distention, nausea, vomiting, diarrhea for 5 days.  Reportedly,  family members had similar symptoms.    In the ED, T90 7.4 F, RR 16, HR 118, BP 141/97, SpO2 98% on RA.  CBG elevated to 433.  CMP showed bicarb 18, glucose 477, BUN 24, creatinine 1.23, AST 52, ALT 45, T. bili 1.3, AG 20.  Lipase 24.  CBC showed WBC 10.1, Hgb 17.8, PLT 197.  BHB elevated to 1.5.  VBG 7.35/42/23.1.  Troponin 6.   CTA chest was negative for PE, noted moderate emphysema CT abdomen pelvis showed internal hernia involving nearly the entire small bowel with marked compression of the jejunal veins and the SMV, causing venous congestion, mesenteric edema, and segmental bowel infarction with pneumatosis and portal venous gas.  Also noted significantly enlarged 5.9 cm left adrenal mass (increased from 4.3 cm), for which surgical and endocrine evaluation was recommended.   In the ED, the patient received 1.2 L bolus of LR, DKA protocol was started with IV insulin .  Rocephin  and Flagyl  were started.  General  surgery was emergently consulted.  She was taken emergently to the OR for ex lap with small bowel resection and then transferred to the ICU intubated on mechanical ventilation with IV insulin  and phenylephrine , switched to Levophed . She was extubated a few hours later on 11/21 AM. She was transitioned off the insulin  drip on 11/21 PM. Levophed  was weaned off by 11/22 AM. NGT clamped 11/22 AM. The patient was downgraded to the medical floor.    Assessment and Plan:   # SBO from internal hernia with segmental bowel infarction status post emergent ex lap with small bowel resection # Postoperative ileus - resolved - Extubated 11/21 - Off pressors since 11/22 AM - NG tube clamped 11/22. NGT removed 11/23 - Pain well-controled now on oral regimen - Passed flatus and had multiple bowel movements.  - Diet was advanced and tolerated soft diet - Postop follow up scheduled by general surgery team on discharge  # Septic shock - resolved # Concern for intra-abdominal sepsis - resolved - Initial shock likely secondary to bowel infarction. Required phenylephrine  then levophed  in the ICU - All vasopressors discontinued by 11/22 - Intra-abdominal sepsis with bowel infarction, pneumatosis, portal venous gas preoperatively now s/p resection with source tonrol - Abx discontinued per General Surgery - Afebrile, no leukocytosis  # Hypophosphatemia - Repleted   # Hyponatremia - resolved   # HAGMA 2/2 Lactic acidosis - resolved   # Mild DKA - resolved # Uncontrolled T2DM - Presented with hyperglycemia (CBG 433-477), AG 20, bicarb 18, BHB 1.5; consistent  with mild DKA; treated with insulin  drip - Gap closed and insulin  drip discontinued 11/20 1 PM - Hgb A1c (11/22) - 9.7 - Managed with SSI ACHS while inpatient  - Diabetes coordinator consulted - recommended metformin  500 mg BID  - Follow up at PCP appointment on 12/15 with Christopher Bohr for continued management   # Acute postoperative respiratory  insufficiency - resolved - Remained intubated postoperatively after emergent ex lap.  Extubated 11/20 1 AM - Since extubation, required intermittent supplemental O2 via [place]. Now on RA - SpO2 goal >88-92% given COPD history   # Thrombocytopenia - resolved - Likely multifactorial secondary to postoperative consumption, inflammatory response, critical illness    # Elevated LFTs - resolved - Mild AST/ALT elevation preoperatively    # AKI - resolved - Creatinine at baseline   # 5.9-cm left adrenal mass  - Enlarging, previously 4.3 now 5.9 cm - Requires outpatient endocrine evaluation for functional workup and surgical consultation      Pain control -  [place]  Controlled Substance Reporting System database was reviewed. and patient was instructed, not to drive, operate heavy machinery, perform activities at heights, swimming or participation in water activities or provide baby-sitting services while on Pain, Sleep and Anxiety Medications; until their outpatient Physician has advised to do so again. Also recommended to not to take more than prescribed Pain, Sleep and Anxiety Medications.  Consultants: general surgery Procedures performed: ex lap with small bowel resection  Disposition: Home  DISCHARGE MEDICATION: Allergies as of 12/03/2023       Reactions   Amoxicillin Hives   Aspirin Hives   Codeine Hives   Penicillins Hives        Medication List     TAKE these medications    acetaminophen  500 MG tablet Commonly known as: TYLENOL  Take 2 tablets (1,000 mg total) by mouth every 6 (six) hours as needed for mild pain (pain score 1-3).   Blood Glucose Monitoring Suppl Devi 1 each by Does not apply route as directed. Dispense based on patient and insurance preference. Use up to four times daily as directed. (FOR ICD-10 E10.9, E11.9).   BLOOD GLUCOSE TEST STRIPS Strp 1 each by Does not apply route as directed. Dispense based on patient and insurance preference. Use up to  four times daily as directed. (FOR ICD-10 E10.9, E11.9).   ibuprofen  400 MG tablet Commonly known as: ADVIL  Take 1 tablet (400 mg total) by mouth every 6 (six) hours as needed for moderate pain (pain score 4-6).   Lancet Device Misc 1 each by Does not apply route as directed. Dispense based on patient and insurance preference. Use up to four times daily as directed. (FOR ICD-10 E10.9, E11.9).   Lancets Misc 1 each by Does not apply route as directed. Dispense based on patient and insurance preference. Use up to four times daily as directed. (FOR ICD-10 E10.9, E11.9).   metFORMIN  500 MG tablet Commonly known as: GLUCOPHAGE  Take 1 tablet (500 mg total) by mouth 2 (two) times daily with a meal.   methocarbamol  750 MG tablet Commonly known as: ROBAXIN  Take 1 tablet (750 mg total) by mouth 4 (four) times daily for 5 days.   multivitamin with minerals Tabs tablet Take 1 tablet by mouth daily.   oxyCODONE  5 MG immediate release tablet Commonly known as: Roxicodone  Take 1 tablet (5 mg total) by mouth every 6 (six) hours as needed for up to 5 days for moderate pain (pain score 4-6).   pantoprazole  40 MG  tablet Commonly known as: PROTONIX  Take 1 tablet (40 mg total) by mouth at bedtime.   polyethylene glycol 17 g packet Commonly known as: MIRALAX  / GLYCOLAX  Take 17 g by mouth daily as needed for mild constipation.   thiamine  100 MG tablet Commonly known as: Vitamin B-1 Take 1 tablet (100 mg total) by mouth daily for 5 days.               Discharge Care Instructions  (From admission, onward)           Start     Ordered   12/03/23 0000  Discharge wound care:       Comments: Please refer to wound care instructions by general surgery   12/03/23 1503            Follow-up Information     Surgery, [facility]. Go on 12/13/2023.   Specialty: General Surgery Why: Staple removal scheduled for 10:30 AM. This is an RN visit. Please arrive 30 min prior to check  in. Contact information: [address] [place] [address] [phone]         Sebastian Moles, MD. Go on 12/25/2023.   Specialty: General Surgery Why: 11:40 AM, please arrive 15 min prior to appointment time to check in. Contact information: [address] [place] [address] [phone]         Celestia Harder, NP Follow up.   Specialty: Nurse Practitioner Why: TIME : 4:30 PM   PLEASE ARRIVE AT 4:00 PM DATE ; DEDCEMBER 15, 2025 MONDAY  PLEASE BRING ALL CURRENT MEDICATION, ID and INS CARD, CO-PAY Contact information: [address] [place] [address] [place] [address] [phone]                Discharge Exam: Fredricka Weights   11/28/23 1822 11/29/23 0630 11/30/23 0630  Weight: 60.3 kg 60.7 kg 62.4 kg   Gen: NAD, A&Ox3 HEENT: NCAT, EOMI Neck: Supple, no JVD CV: RRR, no murmurs Resp: normal WOB, CTAB, no w/r/r Abd: Soft, TTP LLQ, mildly distended, no guarding, BS normoactive, honeycomb dressing overlying midline incision with some dry blood  Ext: No LE edema, pulses 2+ b/l Skin: Warm, dry, no rashes/lesions Neuro: CN II-XII grossly intact, strength 5/5 b/l, sensation intact Psych: Calm, cooperative, appropriate affect  Condition at discharge: good  The results of significant diagnostics from this hospitalization (including imaging, microbiology, ancillary and laboratory) are listed below for reference.   Imaging Studies: DG Abd 1 View Result Date: 11/29/2023 EXAM: 1 VIEW XRAY OF THE ABDOMEN 11/29/2023 01:35:34 AM COMPARISON: None available. CLINICAL HISTORY: Suture check FINDINGS: LINES, TUBES AND DEVICES: Enteric tube partially visualized coiled within the stomach. Foley catheter in place overlying the expected location of the urinary bladder. No unexpected radiopaque foreign body identified within the abdomen and pelvis. Anastomotic staple line noted within the right lower quadrant. BOWEL: Multiple loops of dilated small  bowel compatible with small bowel obstruction. Multiple colonic diverticula noted. SOFT TISSUES: Pneumoperitoneum, likely postsurgical. No opaque urinary calculi. BONES: No acute osseous abnormality. IMPRESSION: 1. Small bowel obstruction. 2. Pneumoperitoneum, likely postsurgical. 3. Multiple colonic diverticula without evidence of diverticulitis. 4. I personally called these findings to the operating room at 1:48 am Electronically signed by: Dorethia Molt MD 11/29/2023 01:50 AM EST RP Workstation: [id number]   CT ABDOMEN PELVIS W CONTRAST Result Date: 11/28/2023 EXAM: CTA CHEST PE WITH AND WITHOUT CONTRAST CT ABDOMEN AND PELVIS WITH AND WITHOUT CONTRAST 11/28/2023 09:45:00 PM TECHNIQUE: CTA of  the chest was performed after the administration of 75 mL of iohexol  (OMNIPAQUE ) 350 MG/ML injection. Multiplanar reformatted images are provided for review. MIP images are provided for review. CT of the abdomen and pelvis was performed with and without the administration of intravenous contrast. Automated exposure control, iterative reconstruction, and/or weight based adjustment of the mA/kV was utilized to reduce the radiation dose to as low as reasonably achievable. COMPARISON: None available. CLINICAL HISTORY: Known history of PE. Tachycardia with chest pain. FINDINGS: CHEST: PULMONARY ARTERIES: Pulmonary arteries are adequately opacified for evaluation. No intraluminal filling defect to suggest pulmonary embolism. Main pulmonary artery is normal in caliber. MEDIASTINUM: Mild coronary artery calcification. Global cardiac size within normal limits. No pericardial effusion. No mediastinal lymphadenopathy. Mild atherosclerotic calcification within the thoracic aorta. No aortic aneurysm. LUNGS AND PLEURA: Moderate emphysema. No focal consolidation or pulmonary edema. No pleural effusion or pneumothorax. SOFT TISSUES AND BONES: Osseous structures are age appropriate. No acute bone abnormality. No lytic or blastic bone  lesion. No acute soft tissue abnormality. 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 No enhancing intrahepatic mass. GALLBLADDER AND BILE DUCTS: Gallbladder is unremarkable. No biliary ductal dilatation. SPLEEN: Spleen demonstrates no acute abnormality. PANCREAS: Pancreas demonstrates no acute abnormality. ADRENAL GLANDS: Heterogeneously enhancing mixed attenuation mass within the left adrenal gland is again identified in keeping with a slow growing adrenal adenoma that are characterized on multiple prior examinations demonstrating interval growth since remote prior examination is now measuring 5.9 cm in greatest dimension (previously measuring 4.3 cm in 2014). Right adrenal gland is unremarkable. KIDNEYS, URETERS AND BLADDER: No stones in the kidneys or ureters. No hydronephrosis. No perinephric or periureteral stranding. Urinary bladder is unremarkable. GI AND BOWEL: There is an internal hernia present with severe extrinsic compression of several large jejunal veins and marked mass effect on the superior mesenteric vein at the point of entry within the hernia which is essentially at the mesenteric root. 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 The herniated bowel involves almost the entire length of small bowel which is markedly dilated and fluid filled. There is, additionally, evidence of mesenteric ischemia involving at least 1 loop of bowel within the left upper quadrant best seen on coronal image 62 and axial image 36. 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 The stomach is fluid-filled and distended with fluid within the distal esophagus in keeping with changes of gastroesophageal reflux. Mild sigmoid diverticulosis. The large bowel is otherwise unremarkable. Appendix normal. REPRODUCTIVE:  Reproductive organs are unremarkable. PERITONEUM AND RETROPERITONEUM: Mild ascites. No gross free intraperitoneal gas. LYMPH NODES: No lymphadenopathy. BONES AND SOFT TISSUES: Osseous structures are age appropriate. No acute bone abnormality. No lytic or blastic bone lesion. No focal soft tissue abnormality. Mild aortoiliac atherosclerotic calcification. No aortic aneurysm. IMPRESSION: 1. No pulmonary embolism. 2. 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 While a single loop of bowel appears infarcted, the vast majority of small bowel appears viable at this point. As noted above, the herniated bowel involves nearly the entire length of small bowel. Emergent surgical evaluation recommended. 3. Moderate pulmonary emphysema. Given emphysema as an independent lung cancer risk factor, consider  evaluation for low-dose CT lung cancer screening if the patient is 59 years old and meets eligibility criteria. 4. Enlarging left adrenal mass now 5.9 cm, previously 4.3 cm in 2014. Recommend surgical consultation and biochemical evaluation for a functioning adrenal lesion (e.g., pheochromocytoma) prior to considering resection. - the critical findings of this study were discussed directly with Dr. Simon by myself at 10:35 pm. Electronically signed by: Dorethia Molt MD 11/28/2023 10:38 PM EST RP Workstation: [id number]   CT Angio Chest PE W and/or Wo Contrast Result Date: 11/28/2023 EXAM: CTA CHEST PE WITH AND WITHOUT CONTRAST CT ABDOMEN AND PELVIS WITH AND WITHOUT CONTRAST 11/28/2023 09:45:00 PM TECHNIQUE: CTA of the chest was performed after the administration of 75 mL of iohexol  (OMNIPAQUE ) 350 MG/ML injection. Multiplanar reformatted images are provided for review. MIP images  are provided for review. CT of the abdomen and pelvis was performed with and without the administration of intravenous contrast. Automated exposure control, iterative reconstruction, and/or weight based adjustment of the mA/kV was utilized to reduce the radiation dose to as low as reasonably achievable. COMPARISON: None available. CLINICAL HISTORY: Known history of PE. Tachycardia with chest pain. FINDINGS: CHEST: PULMONARY ARTERIES: Pulmonary arteries are adequately opacified for evaluation. No intraluminal filling defect to suggest pulmonary embolism. Main pulmonary artery is normal in caliber. MEDIASTINUM: Mild coronary artery calcification. Global cardiac size within normal limits. No pericardial effusion. No mediastinal lymphadenopathy. Mild atherosclerotic calcification within the thoracic aorta. No aortic aneurysm. LUNGS AND PLEURA: Moderate emphysema. No focal consolidation or pulmonary edema. No pleural effusion or pneumothorax. SOFT TISSUES AND BONES: Osseous structures are age appropriate. No acute bone abnormality. No lytic or blastic bone lesion. No acute soft tissue abnormality. 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 No enhancing intrahepatic mass. GALLBLADDER AND BILE DUCTS: Gallbladder is unremarkable. No biliary ductal dilatation. SPLEEN: Spleen demonstrates no acute abnormality. PANCREAS: Pancreas demonstrates no acute abnormality. ADRENAL GLANDS: Heterogeneously enhancing mixed attenuation mass within the left adrenal gland is again identified in keeping with a slow growing adrenal adenoma that are characterized on multiple prior examinations demonstrating interval growth since remote prior examination is now measuring 5.9 cm in greatest dimension (previously measuring 4.3 cm in 2014). Right adrenal gland is unremarkable. KIDNEYS, URETERS AND BLADDER: No stones in the kidneys or ureters. No  hydronephrosis. No perinephric or periureteral stranding. Urinary bladder is unremarkable. GI AND BOWEL: There is an internal hernia present with severe extrinsic compression of several large jejunal veins and marked mass effect on the superior mesenteric vein at the point of entry within the hernia which is essentially at the mesenteric root. 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 The herniated bowel involves almost the entire length of small bowel which is markedly dilated and fluid filled. There is, additionally, evidence of mesenteric ischemia involving at least 1 loop of bowel within the left upper quadrant best seen on coronal image 62 and axial image 36. 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 The stomach is fluid-filled and distended with fluid within the distal esophagus in keeping with changes of gastroesophageal reflux. Mild sigmoid diverticulosis. The large bowel is otherwise unremarkable. Appendix normal. REPRODUCTIVE: Reproductive organs are unremarkable. PERITONEUM AND RETROPERITONEUM: Mild ascites. No gross free intraperitoneal gas. LYMPH NODES: No lymphadenopathy. BONES AND SOFT TISSUES: Osseous structures are age appropriate. No acute bone abnormality. No lytic  or blastic bone lesion. No focal soft tissue abnormality. Mild aortoiliac atherosclerotic calcification. No aortic aneurysm. IMPRESSION: 1. No pulmonary embolism. 2. 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 While a single loop of bowel appears infarcted, the vast majority of small bowel appears viable at this point. As noted above, the herniated bowel involves nearly the  entire length of small bowel. Emergent surgical evaluation recommended. 3. Moderate pulmonary emphysema. Given emphysema as an independent lung cancer risk factor, consider evaluation for low-dose CT lung cancer screening if the patient is 59 years old and meets eligibility criteria. 4. Enlarging left adrenal mass now 5.9 cm, previously 4.3 cm in 2014. Recommend surgical consultation and biochemical evaluation for a functioning adrenal lesion (e.g., pheochromocytoma) prior to considering resection. - the critical findings of this study were discussed directly with Dr. Simon by myself at 10:35 pm. Electronically signed by: Dorethia Molt MD 11/28/2023 10:38 PM EST RP Workstation: [id number]    Microbiology: Results for orders placed or performed during the hospital encounter of 11/28/23  MRSA Next Gen by PCR, Nasal     Status: None   Collection Time: 11/29/23  2:18 AM   Specimen: Nasal Mucosa; Nasal Swab  Result Value Ref Range Status   MRSA by PCR Next Gen NOT DETECTED NOT DETECTED Final    Comment: (NOTE) The GeneXpert MRSA Assay (FDA approved for NASAL specimens only), is one component of a comprehensive MRSA colonization surveillance program. It is not intended to diagnose MRSA infection nor to guide or monitor treatment for MRSA infections. Test performance is not FDA approved in patients less than 51 years old. Performed at [facility], [address] [place], [place] [address]   Blood culture (routine x 2)     Status: None (Preliminary result)   Collection Time: 11/29/23  5:22 AM   Specimen: BLOOD RIGHT HAND  Result Value Ref Range Status   Specimen Description BLOOD RIGHT HAND  Final   Special Requests   Final    BOTTLES DRAWN AEROBIC ONLY Blood Culture results may not be optimal due to an inadequate volume of blood received in culture bottles   Culture   Final    NO GROWTH 4 DAYS Performed at [facility], [address] [place], [place] [address]    Report  Status PENDING  Incomplete  Blood culture (routine x 2)     Status: None (Preliminary result)   Collection Time: 11/29/23  9:43 AM   Specimen: BLOOD RIGHT HAND  Result Value Ref Range Status   Specimen Description BLOOD RIGHT HAND  Final   Special Requests   Final    BOTTLES DRAWN AEROBIC ONLY Blood Culture results may not be optimal due to an inadequate volume of blood received in culture bottles   Culture   Final    NO GROWTH 4 DAYS Performed at [facility], [address] [place], [place] [address]    Report Status PENDING  Incomplete    Labs: CBC: Recent Labs  Lab 11/28/23 1810 11/28/23 1830 11/29/23 0811 11/30/23 0415 12/01/23 0204 12/02/23 0220 12/03/23 0452  WBC 10.1  --  9.3 10.3 10.3 9.4 10.6*  NEUTROABS 9.0*  --   --   --   --   --   --  HGB 17.8*   < > 15.1* 13.8 12.7 13.0 14.6  HCT 54.1*   < > 44.5 42.2 39.2 38.8 43.3  MCV 95.2  --  94.3 96.1 98.2 94.4 94.1  PLT 197  --  162 100* 89* 132* 170   < > = values in this interval not displayed.   Basic Metabolic Panel: Recent Labs  Lab 11/29/23 1216 11/30/23 0415 12/01/23 0204 12/02/23 0216 12/02/23 0220 12/02/23 1811 12/02/23 2347 12/03/23 0452  NA 138 137 134* 131*  --  138 139 139  K 4.4 4.0 3.5 3.5  --  3.5 3.6 3.6  CL 105 104 103 95*  --  103 104 105  CO2 22 23 26  20*  --  23 23 22   GLUCOSE 147* 193* 99 96  --  126* 128* 110*  BUN 23* 23* 16 9  --  7 7 6   CREATININE 0.89 0.92 0.68 0.73  --  0.62 0.62 0.62  CALCIUM  8.0* 7.8* 7.7* 7.8*  --  7.6* 7.8* 8.0*  MG 1.6* 1.8 1.9  --  1.8  --  1.6* 1.8  PHOS 2.5 2.3* 2.8  --  1.8*  --   --  2.3*   Liver Function Tests: Recent Labs  Lab 11/28/23 1810 12/02/23 0216 12/03/23 0452  AST 52* 13* 16  ALT 45* 21 17  ALKPHOS 101 65 74  BILITOT 1.3* 1.3* 0.8  PROT 6.9 5.4* 5.4*  ALBUMIN  3.5 2.4* 2.5*   CBG: Recent Labs  Lab 12/02/23 1151 12/02/23 1617 12/02/23 2026 12/03/23 0745 12/03/23 1128  GLUCAP 143* 148* 114* 124* 94    Discharge  time spent: 35 minutes  Signed: Duffy Larch, MD [facility] 12/03/2023

## 2023-12-04 LAB — CULTURE, BLOOD (ROUTINE X 2)
Culture: NO GROWTH
Culture: NO GROWTH

## 2023-12-04 LAB — SURGICAL PATHOLOGY

## 2023-12-06 ENCOUNTER — Emergency Department (HOSPITAL_COMMUNITY)
Admission: EM | Admit: 2023-12-06 | Discharge: 2023-12-06 | Disposition: A | Discharge status: Home / Self Care | Attending: Emergency Medicine | Admitting: Emergency Medicine

## 2023-12-06 DIAGNOSIS — I129 Hypertensive chronic kidney disease with stage 1 through stage 4 chronic kidney disease, or unspecified chronic kidney disease: Secondary | ICD-10-CM | POA: Diagnosis not present

## 2023-12-06 DIAGNOSIS — N189 Chronic kidney disease, unspecified: Secondary | ICD-10-CM | POA: Diagnosis not present

## 2023-12-06 DIAGNOSIS — E119 Type 2 diabetes mellitus without complications: Secondary | ICD-10-CM | POA: Diagnosis not present

## 2023-12-06 DIAGNOSIS — Z4800 Encounter for change or removal of nonsurgical wound dressing: Secondary | ICD-10-CM | POA: Insufficient documentation

## 2023-12-06 DIAGNOSIS — K5900 Constipation, unspecified: Secondary | ICD-10-CM | POA: Insufficient documentation

## 2023-12-06 DIAGNOSIS — R109 Unspecified abdominal pain: Secondary | ICD-10-CM | POA: Diagnosis not present

## 2023-12-06 DIAGNOSIS — Z4801 Encounter for change or removal of surgical wound dressing: Secondary | ICD-10-CM | POA: Diagnosis not present

## 2023-12-06 DIAGNOSIS — R110 Nausea: Secondary | ICD-10-CM | POA: Diagnosis not present

## 2023-12-06 DIAGNOSIS — Z5189 Encounter for other specified aftercare: Secondary | ICD-10-CM

## 2023-12-06 DIAGNOSIS — R197 Diarrhea, unspecified: Secondary | ICD-10-CM | POA: Diagnosis not present

## 2023-12-06 DIAGNOSIS — Z7984 Long term (current) use of oral hypoglycemic drugs: Secondary | ICD-10-CM | POA: Diagnosis not present

## 2023-12-06 DIAGNOSIS — R531 Weakness: Secondary | ICD-10-CM | POA: Diagnosis not present

## 2023-12-06 MED ORDER — ONDANSETRON 4 MG PO TBDP: 4.0000 mg | ORAL_TABLET | Freq: Three times a day (TID) | ORAL | 0 refills | Status: DC | PRN

## 2023-12-06 MED ORDER — OXYCODONE-ACETAMINOPHEN 5-325 MG PO TABS
1.0000 | ORAL_TABLET | Freq: Once | ORAL | Status: AC
  Administered 2023-12-06: 1 via ORAL
  Filled 2023-12-06: qty 1

## 2023-12-06 MED ORDER — ONDANSETRON 4 MG PO TBDP
4.0000 mg | ORAL_TABLET | Freq: Once | ORAL | Status: AC
  Administered 2023-12-06: 4 mg via ORAL
  Filled 2023-12-06: qty 1

## 2023-12-06 NOTE — Discharge Instructions (Signed)
 Your history, exam, and evaluation today revealed a well-appearing surgical wound that does not show evidence of infection or dehiscence.  I spoke to the surgical team who felt that it was time for that previous dressing to be removed anyway and we were able to redress it and clean it.  Please keep the wound clean and dry and follow-up with your surgical team for staple removal  as they recommend.  Due to the nausea been having, please fill the prescription for the nausea medication and use your home pain medicine as well.  We had a shared decision making conversation and agreed to hold on extensive lab or imaging workup today and given her well appearance we agree with plan for discharge.  If any symptoms change or worsen acutely, please return to the nearest emergency department and please follow-up with your primary team and surgical team.

## 2023-12-06 NOTE — ED Triage Notes (Signed)
 Pt to ED via [facility] from home. Surgery to repair bowel obstruction. Staples. Pt was cleaning herself and got stool under bandage. Honeycomb  dressing was over wound before changed by EMS. Ems did not have the exact dressing supplies so patient wanted to come to get dressing changed here.  HR: 110 (pt says its normal for her) other vitals WNL with ems.

## 2023-12-06 NOTE — ED Notes (Signed)
 Pending discharge pt waiting for ride.

## 2023-12-06 NOTE — ED Provider Notes (Signed)
 [place] EMERGENCY DEPARTMENT AT [facility] Provider Note   CSN: [id number] Arrival date & time: 12/06/23  2052     Patient presents with: Dressing Change and Wound Check   [name] is a 58 y.o. female.   The history is provided by the patient and medical records. No language interpreter was used.  Wound Check This is a new problem. The current episode started more than 1 week ago. The problem occurs constantly. The problem has not changed since onset.Associated symptoms include abdominal pain (since the surgery). Pertinent negatives include no chest pain, no headaches and no shortness of breath. Nothing aggravates the symptoms. Nothing relieves the symptoms. She has tried nothing for the symptoms. The treatment provided no relief.       Prior to Admission medications   Medication Sig Start Date End Date Taking? Authorizing Provider  acetaminophen  (TYLENOL ) 500 MG tablet Take 2 tablets (1,000 mg total) by mouth every 6 (six) hours as needed for mild pain (pain score 1-3). 12/03/23   Al-Sultani, Anmar, MD  Blood Glucose Monitoring Suppl DEVI 1 each by Does not apply route as directed. Dispense based on patient and insurance preference. Use up to four times daily as directed. (FOR ICD-10 E10.9, E11.9). 12/03/23   Al-Sultani, Anmar, MD  Glucose Blood (BLOOD GLUCOSE TEST STRIPS) STRP 1 each by Does not apply route as directed. Dispense based on patient and insurance preference. Use up to four times daily as directed. (FOR ICD-10 E10.9, E11.9). 12/03/23   Al-Sultani, Anmar, MD  ibuprofen  (ADVIL ) 400 MG tablet Take 1 tablet (400 mg total) by mouth every 6 (six) hours as needed for moderate pain (pain score 4-6). 12/03/23   Al-Sultani, Anmar, MD  Lancet Device MISC 1 each by Does not apply route as directed. Dispense based on patient and insurance preference. Use up to four times daily as directed. (FOR ICD-10 E10.9, E11.9). 12/03/23   Al-Sultani, Anmar, MD  Lancets MISC 1  each by Does not apply route as directed. Dispense based on patient and insurance preference. Use up to four times daily as directed. (FOR ICD-10 E10.9, E11.9). 12/03/23   Al-Sultani, Anmar, MD  metFORMIN  (GLUCOPHAGE ) 500 MG tablet Take 1 tablet (500 mg total) by mouth 2 (two) times daily with a meal. 12/03/23   Al-Sultani, Anmar, MD  methocarbamol  (ROBAXIN ) 750 MG tablet Take 1 tablet (750 mg total) by mouth 4 (four) times daily for 5 days. 12/03/23 12/08/23  Al-Sultani, Anmar, MD  Multiple Vitamin (MULTIVITAMIN WITH MINERALS) TABS tablet Take 1 tablet by mouth daily. 12/03/23   Al-Sultani, Anmar, MD  oxyCODONE  (ROXICODONE ) 5 MG immediate release tablet Take 1 tablet (5 mg total) by mouth every 6 (six) hours as needed for up to 5 days for moderate pain (pain score 4-6). 12/03/23 12/08/23  Al-Sultani, Anmar, MD  pantoprazole  (PROTONIX ) 40 MG tablet Take 1 tablet (40 mg total) by mouth at bedtime. 12/03/23 01/02/24  Al-Sultani, Anmar, MD  polyethylene glycol (MIRALAX  / GLYCOLAX ) 17 g packet Take 17 g by mouth daily as needed for mild constipation. 12/03/23   Al-Sultani, Anmar, MD  thiamine  (VITAMIN B-1) 100 MG tablet Take 1 tablet (100 mg total) by mouth daily for 5 days. 12/03/23 12/08/23  Al-Sultani, Anmar, MD    Allergies: Amoxicillin, Aspirin, Codeine, and Penicillins    Review of Systems  Constitutional:  Negative for chills, fatigue and fever.  HENT:  Negative for congestion.   Respiratory:  Negative for cough, chest tightness and shortness of breath.  Cardiovascular:  Negative for chest pain.  Gastrointestinal:  Positive for abdominal pain (since the surgery), diarrhea and nausea. Negative for abdominal distention and constipation.  Genitourinary:  Negative for dysuria and flank pain.  Musculoskeletal:  Negative for back pain, neck pain and neck stiffness.  Skin:  Positive for wound. Negative for rash.  Neurological:  Negative for weakness, light-headedness, numbness and headaches.   Psychiatric/Behavioral:  Negative for agitation and confusion.   All other systems reviewed and are negative.   Updated Vital Signs Temp (!) 97.5 F (36.4 C) (Oral)   LMP  (LMP Unknown) Comment: a few months  Physical Exam Vitals and nursing note reviewed.  Constitutional:      General: She is not in acute distress.    Appearance: She is well-developed. She is not ill-appearing, toxic-appearing or diaphoretic.  HENT:     Head: Normocephalic and atraumatic.     Nose: No congestion or rhinorrhea.     Mouth/Throat:     Pharynx: No oropharyngeal exudate or posterior oropharyngeal erythema.  Eyes:     Extraocular Movements: Extraocular movements intact.     Conjunctiva/sclera: Conjunctivae normal.     Pupils: Pupils are equal, round, and reactive to light.  Cardiovascular:     Rate and Rhythm: Normal rate and regular rhythm.     Heart sounds: No murmur heard. Pulmonary:     Effort: Pulmonary effort is normal. No respiratory distress.     Breath sounds: Normal breath sounds. No wheezing, rhonchi or rales.  Chest:     Chest wall: No tenderness.  Abdominal:     Palpations: Abdomen is soft.     Tenderness: There is abdominal tenderness. There is no right CVA tenderness, left CVA tenderness, guarding or rebound.     Comments: Patient has an ex lap wound with no dehiscence.  There is some stool near the wound from the reported diarrhea episode however there is no evidence of wound infection at this time.  1 staple had fallen out partially and was not holding onto the skin near the surgical wound.  It was gently removed without any bleeding.  No other significant tenderness.  Musculoskeletal:        General: Tenderness present. No swelling.     Cervical back: Neck supple.  Skin:    General: Skin is warm and dry.     Capillary Refill: Capillary refill takes less than 2 seconds.     Findings: No erythema or rash.  Neurological:     General: No focal deficit present.     Mental  Status: She is alert.     Sensory: No sensory deficit.     Motor: No weakness.  Psychiatric:        Mood and Affect: Mood normal.     (all labs ordered are listed, but only abnormal results are displayed) Labs Reviewed - No data to display  EKG: None  Radiology: No results found.   Procedures   Medications Ordered in the ED - No data to display                                  Medical Decision Making Risk Prescription drug management.    TAKODA SIEDLECKI is a 58 y.o. female with a past medical history significant for CKD, diabetes, hypertension, TIA, previous stroke, previous DVT, and recent bowel resection surgery 8 days ago who presents for wound evaluation.  According to  patient, she had diarrhea as her bowels have been restarting recently and some diarrhea stool got onto her wound and under her surgical dressing that had not yet been changed.  She says she is post to see the surgeons next week but due to the wound she wanted make sure she did not need a special dressing.  Otherwise she reports she has had some abdominal discomfort since her surgery but no acute change in it.  She reports some nausea and occasional vomiting but not vomiting now.  She reports she initially had some constipation but then it changed to more loose.  No blood reported in her stools.  Otherwise she is not feeling ill and does not suspect infection in the wound.  I took the surgical dressing down and patient does have a well-appearing surgical wound.  I do not see dehiscence.  1 staple had partially fallen out and was no longer holding tension on the wound and I gently removed it.  No bleeding.  Some minimal tenderness but no surrounding erythema drainage or evidence of infection at this time.  She did have good bowel sounds.  Patient reports that she has pain medicine at home but does not have nausea medicine.  She request a dose of pain and nausea medicine orally and would like a prescription for  nausea medicine to help with her outpatient management.  This was felt to be reasonable.  I briefly spoke to one of the general surgeons who felt that as the patient is over 1 week from her surgery, she does not need the special honeycomb dressing given the wound well appearance.  He agreed with cleaning the wound and dressing it with nonadherent dressing and she will be able to follow-up with her surgical team given lack of other complicating factors.  Patient agrees with this plan and we gave her some supplies to redress if needed.  She will give prescription for Zofran  and is feeling better.  We had a shared decision-making conversation offering large workup including labs or even possible imaging given the abdominal discomfort however patient does not think she needs it and given her well appearance this is reasonable.  She understood extremely strict return precautions for any new or worsening symptoms and if she had to return she may need further workup.  Patient agrees.  Will discharge for outpatient follow-up and she had other questions or concerns and was discharged.           Final diagnoses:  Visit for wound check  Dressing change    ED Discharge Orders          Ordered    ondansetron  (ZOFRAN -ODT) 4 MG disintegrating tablet  Every 8 hours PRN        12/06/23 2142           Clinical Impression: 1. Visit for wound check   2. Dressing change     Disposition: Discharge  Condition: Good  I have discussed the results, Dx and Tx plan with the pt(& family if present). He/she/they expressed understanding and agree(s) with the plan. Discharge instructions discussed at great length. Strict return precautions discussed and pt &/or family have verbalized understanding of the instructions. No further questions at time of discharge.    New Prescriptions   ONDANSETRON  (ZOFRAN -ODT) 4 MG DISINTEGRATING TABLET    Take 1 tablet (4 mg total) by mouth every 8 (eight) hours as needed  for nausea or vomiting.    Follow Up: your PCP and surgical  teams         [place] Masaki, Lonni PARAS, MD 12/06/23 2211

## 2023-12-09 ENCOUNTER — Other Ambulatory Visit: Payer: Self-pay

## 2023-12-09 ENCOUNTER — Encounter (HOSPITAL_COMMUNITY): Payer: Self-pay

## 2023-12-09 ENCOUNTER — Inpatient Hospital Stay (HOSPITAL_COMMUNITY)
Admission: EM | Admit: 2023-12-09 | Discharge: 2023-12-17 | DRG: 871 | Disposition: A | Attending: Family Medicine | Admitting: Family Medicine

## 2023-12-09 DIAGNOSIS — K56609 Unspecified intestinal obstruction, unspecified as to partial versus complete obstruction: Secondary | ICD-10-CM | POA: Diagnosis not present

## 2023-12-09 DIAGNOSIS — E1165 Type 2 diabetes mellitus with hyperglycemia: Secondary | ICD-10-CM | POA: Diagnosis present

## 2023-12-09 DIAGNOSIS — I2699 Other pulmonary embolism without acute cor pulmonale: Secondary | ICD-10-CM | POA: Diagnosis present

## 2023-12-09 DIAGNOSIS — Z86718 Personal history of other venous thrombosis and embolism: Secondary | ICD-10-CM

## 2023-12-09 DIAGNOSIS — K567 Ileus, unspecified: Secondary | ICD-10-CM

## 2023-12-09 DIAGNOSIS — E876 Hypokalemia: Secondary | ICD-10-CM | POA: Diagnosis present

## 2023-12-09 DIAGNOSIS — K56600 Partial intestinal obstruction, unspecified as to cause: Secondary | ICD-10-CM | POA: Diagnosis present

## 2023-12-09 DIAGNOSIS — I4729 Other ventricular tachycardia: Secondary | ICD-10-CM | POA: Diagnosis present

## 2023-12-09 DIAGNOSIS — I2694 Multiple subsegmental pulmonary emboli without acute cor pulmonale: Secondary | ICD-10-CM

## 2023-12-09 DIAGNOSIS — Z8673 Personal history of transient ischemic attack (TIA), and cerebral infarction without residual deficits: Secondary | ICD-10-CM

## 2023-12-09 DIAGNOSIS — R197 Diarrhea, unspecified: Secondary | ICD-10-CM

## 2023-12-09 DIAGNOSIS — Z720 Tobacco use: Secondary | ICD-10-CM | POA: Diagnosis present

## 2023-12-09 DIAGNOSIS — E278 Other specified disorders of adrenal gland: Secondary | ICD-10-CM | POA: Diagnosis present

## 2023-12-09 DIAGNOSIS — R6510 Systemic inflammatory response syndrome (SIRS) of non-infectious origin without acute organ dysfunction: Secondary | ICD-10-CM | POA: Diagnosis present

## 2023-12-09 LAB — CBC
HCT: 39.3 % (ref 36.0–46.0)
Hemoglobin: 12.6 g/dL (ref 12.0–15.0)
MCH: 31.4 pg (ref 26.0–34.0)
MCHC: 32.1 g/dL (ref 30.0–36.0)
MCV: 98 fL (ref 80.0–100.0)
Platelets: 368 K/uL (ref 150–400)
RBC: 4.01 MIL/uL (ref 3.87–5.11)
RDW: 13.8 % (ref 11.5–15.5)
WBC: 27.4 K/uL — ABNORMAL HIGH (ref 4.0–10.5)
nRBC: 0 % (ref 0.0–0.2)

## 2023-12-09 LAB — COMPREHENSIVE METABOLIC PANEL WITH GFR
ALT: 10 U/L (ref 0–44)
AST: 13 U/L — ABNORMAL LOW (ref 15–41)
Albumin: 2.2 g/dL — ABNORMAL LOW (ref 3.5–5.0)
Alkaline Phosphatase: 64 U/L (ref 38–126)
Anion gap: 14 (ref 5–15)
BUN: 13 mg/dL (ref 6–20)
CO2: 19 mmol/L — ABNORMAL LOW (ref 22–32)
Calcium: 7 mg/dL — ABNORMAL LOW (ref 8.9–10.3)
Chloride: 106 mmol/L (ref 98–111)
Creatinine, Ser: 0.71 mg/dL (ref 0.44–1.00)
GFR, Estimated: >60 mL/min (ref >60)
Glucose, Bld: 165 mg/dL — ABNORMAL HIGH (ref 70–99)
Potassium: 2.6 mmol/L — CL (ref 3.5–5.1)
Sodium: 139 mmol/L (ref 135–145)
Total Bilirubin: 0.5 mg/dL (ref 0.0–1.2)
Total Protein: 4.7 g/dL — ABNORMAL LOW (ref 6.5–8.1)

## 2023-12-09 LAB — LIPASE, BLOOD: Lipase: 21 U/L (ref 11–51)

## 2023-12-09 MED ORDER — ONDANSETRON HCL 4 MG/2ML IJ SOLN
4.0000 mg | Freq: Once | INTRAMUSCULAR | Status: AC
  Administered 2023-12-09: 4 mg via INTRAVENOUS
  Filled 2023-12-09: qty 2

## 2023-12-09 MED ORDER — SODIUM CHLORIDE 0.9 % IV BOLUS
1000.0000 mL | Freq: Once | INTRAVENOUS | Status: AC
  Administered 2023-12-09: 1000 mL via INTRAVENOUS

## 2023-12-09 MED ORDER — FENTANYL CITRATE (PF) 50 MCG/ML IJ SOSY
50.0000 ug | PREFILLED_SYRINGE | Freq: Once | INTRAMUSCULAR | Status: AC
  Administered 2023-12-09: 50 ug via INTRAVENOUS
  Filled 2023-12-09: qty 1

## 2023-12-09 NOTE — ED Provider Notes (Signed)
 [place] [facility]PARTMENT AT [facility] Provider Note   CSN: [id number] Arrival date & time: 12/09/23  2231     Patient presents with: Nausea and Post-op Problem   [name] is a 58 y.o. female.   The history is provided by the patient and medical records.   58 year old female with hx of PE no longer on anticoagulation, diabetes, depression, hypertension, prior stroke, presenting to the ED with abdominal pain, N/V/D.  Recently underwent ex lap with small bowel resection for SBO secondary to internal hernia with bowel ischemia.  She was discharged 12/03/23.  States since that time she has not been able to eat/drink.  Whenever she tries she just continues vomiting.  Also reports loose stools continuously but have been non-bloody.  Denies fever/chills.  Reports some drainage from her incision-- had ED visit just a few days ago for that as well.  Prior to Admission medications   Medication Sig Start Date End Date Taking? Authorizing Provider  acetaminophen  (TYLENOL ) 500 MG tablet Take 2 tablets (1,000 mg total) by mouth every 6 (six) hours as needed for mild pain (pain score 1-3). 12/03/23   Al-Sultani, Anmar, MD  Blood Glucose Monitoring Suppl DEVI 1 each by Does not apply route as directed. Dispense based on patient and insurance preference. Use up to four times daily as directed. (FOR ICD-10 E10.9, E11.9). 12/03/23   Al-Sultani, Anmar, MD  Glucose Blood (BLOOD GLUCOSE TEST STRIPS) STRP 1 each by Does not apply route as directed. Dispense based on patient and insurance preference. Use up to four times daily as directed. (FOR ICD-10 E10.9, E11.9). 12/03/23   Al-Sultani, Anmar, MD  ibuprofen  (ADVIL ) 400 MG tablet Take 1 tablet (400 mg total) by mouth every 6 (six) hours as needed for moderate pain (pain score 4-6). 12/03/23   Al-Sultani, Anmar, MD  Lancet Device MISC 1 each by Does not apply route as directed. Dispense based on patient and insurance preference. Use up to  four times daily as directed. (FOR ICD-10 E10.9, E11.9). 12/03/23   Al-Sultani, Anmar, MD  Lancets MISC 1 each by Does not apply route as directed. Dispense based on patient and insurance preference. Use up to four times daily as directed. (FOR ICD-10 E10.9, E11.9). 12/03/23   Al-Sultani, Anmar, MD  metFORMIN  (GLUCOPHAGE ) 500 MG tablet Take 1 tablet (500 mg total) by mouth 2 (two) times daily with a meal. 12/03/23   Al-Sultani, Anmar, MD  Multiple Vitamin (MULTIVITAMIN WITH MINERALS) TABS tablet Take 1 tablet by mouth daily. 12/03/23   Al-Sultani, Anmar, MD  ondansetron  (ZOFRAN -ODT) 4 MG disintegrating tablet Take 1 tablet (4 mg total) by mouth every 8 (eight) hours as needed for nausea or vomiting. 12/06/23   Tegeler, Lonni PARAS, MD  pantoprazole  (PROTONIX ) 40 MG tablet Take 1 tablet (40 mg total) by mouth at bedtime. 12/03/23 01/02/24  Al-Sultani, Anmar, MD  polyethylene glycol (MIRALAX  / GLYCOLAX ) 17 g packet Take 17 g by mouth daily as needed for mild constipation. 12/03/23   Al-Sultani, Anmar, MD    Allergies: Amoxicillin, Aspirin, Codeine, and Penicillins    Review of Systems  Gastrointestinal:  Positive for abdominal pain, diarrhea, nausea and vomiting.  All other systems reviewed and are negative.   Updated Vital Signs BP 99/69   Pulse (!) 107   Temp 97.9 F (36.6 C) (Oral)   Resp (!) 27   LMP  (LMP Unknown) Comment: a few months  SpO2 97%   Physical Exam Vitals and nursing note reviewed.  Constitutional:      Appearance: She is well-developed.     Comments: Unwell appearing  HENT:     Head: Normocephalic and atraumatic.     Mouth/Throat:     Comments: Dry mucous membranes Eyes:     Conjunctiva/sclera: Conjunctivae normal.     Pupils: Pupils are equal, round, and reactive to light.  Cardiovascular:     Rate and Rhythm: Normal rate and regular rhythm.     Heart sounds: Normal heart sounds.  Pulmonary:     Effort: Pulmonary effort is normal.     Breath sounds:  Normal breath sounds.  Abdominal:     General: Bowel sounds are normal.     Palpations: Abdomen is soft.     Comments: Midline abdominal incision, staples in place, there is scant amount of serosanguinous drainage present on dressing, otherwise incision appears clean without dehiscence She does appear distended  Musculoskeletal:        General: Normal range of motion.     Cervical back: Normal range of motion.  Skin:    General: Skin is warm and dry.  Neurological:     Mental Status: She is alert and oriented to person, place, and time.     (all labs ordered are listed, but only abnormal results are displayed) Labs Reviewed  COMPREHENSIVE METABOLIC PANEL WITH GFR - Abnormal; Notable for the following components:      Result Value   Potassium 2.6 (*)    CO2 19 (*)    Glucose, Bld 165 (*)    Calcium  7.0 (*)    Total Protein 4.7 (*)    Albumin  2.2 (*)    AST 13 (*)    All other components within normal limits  CBC - Abnormal; Notable for the following components:   WBC 27.4 (*)    All other components within normal limits  URINALYSIS, ROUTINE W REFLEX MICROSCOPIC - Abnormal; Notable for the following components:   APPearance HAZY (*)    Glucose, UA 50 (*)    Ketones, ur 5 (*)    Protein, ur 30 (*)    All other components within normal limits  MAGNESIUM  - Abnormal; Notable for the following components:   Magnesium  1.3 (*)    All other components within normal limits  C DIFFICILE QUICK SCREEN W PCR REFLEX    GASTROINTESTINAL PANEL BY PCR, STOOL (REPLACES STOOL CULTURE)  LIPASE, BLOOD    EKG: None  Radiology: CT Angio Chest PE W and/or Wo Contrast Result Date: 12/10/2023 EXAM: CTA CHEST 12/10/2023 02:11:50 AM TECHNIQUE: CTA of the chest was performed after the administration of 75 mL of iohexol  (OMNIPAQUE ) 350 MG/ML injection. Multiplanar reformatted images are provided for review. MIP images are provided for review. Automated exposure control, iterative reconstruction,  and/or weight based adjustment of the mA/kV was utilized to reduce the radiation dose to as low as reasonably achievable. COMPARISON: CTA chest 11/28/2023. CLINICAL HISTORY: poss filling defect on abdominal CT, rads recommended follow-up im aging. FINDINGS: PULMONARY ARTERIES: Pulmonary arteries are adequately opacified for evaluation. Acute segmental and subsegmental pulmonary emboli in the bilateral lower lobes and left upper lobe pulmonary arteries. The RV measures 3.5 cm and the LV measures 3.9 cm. RV/LV ratio is 0.9. No evidence of right heart strain. Main pulmonary artery is normal in caliber. MEDIASTINUM: The heart and pericardium demonstrate no acute abnormality. There is no acute abnormality of the thoracic aorta. LYMPH NODES: No mediastinal, hilar or axillary lymphadenopathy. LUNGS AND PLEURA: Moderate emphysema. No focal  consolidation or pulmonary edema. No evidence of pleural effusion or pneumothorax. UPPER ABDOMEN: Findings in the upper abdomen not substantially changed from same day CT abdomen and pelvis. See that record for details. SOFT TISSUES AND BONES: No acute bone or soft tissue abnormality. IMPRESSION: 1. Acute segmental and subsegmental pulmonary emboli in the bilateral lower lobes and left upper lobe pulmonary arteries. No evidence of right heart strain (RV/LV ratio 0.9). 2. Moderate emphysema. Given that pulmonary emphysema is an independent risk factor for lung cancer in patients aged 59, consider evaluation for a low-dose CT lung cancer screening program. 3. Critical value/emergent results were called by telephone at the time of interpretation on 12/10/2023 at 2:20 AM to Dr. Jarold, who verbally acknowledged these results. Electronically signed by: Norman Gatlin MD 12/10/2023 02:23 AM EST RP Workstation: [id number]   CT ABDOMEN PELVIS W CONTRAST Result Date: 12/10/2023 EXAM: CT ABDOMEN AND PELVIS WITH CONTRAST 12/10/2023 12:28:18 AM TECHNIQUE: CT of the abdomen and pelvis was  performed with the administration of intravenous contrast. 75 mL of iohexol  (OMNIPAQUE ) 350 MG/ML injection was administered. Multiplanar reformatted images are provided for review. Automated exposure control, iterative reconstruction, and/or weight-based adjustment of the mA/kV was utilized to reduce the radiation dose to as low as reasonably achievable. COMPARISON: CT dated 11/28/2023. CLINICAL HISTORY: Abdominal pain, post-op; recent SBO surgery, increased pain and N/V. FINDINGS: LOWER CHEST: Possible filling defect in a right lower lobe pulmonary artery on series 3 image 1. This may be due to artifact versus pulmonary embolism. Dedicated CTA of the chest is recommended. LIVER: Hypoattenuating focus along the anterior margin of the right hepatic lobe near the falciform ligament may represent focal fat deposition. GALLBLADDER AND BILE DUCTS: Gallbladder is unremarkable. No biliary ductal dilatation. SPLEEN: No acute abnormality. PANCREAS: No acute abnormality. ADRENAL GLANDS: Unchanged heterogeneous left adrenal nodule containing punctate calcifications. KIDNEYS, URETERS AND BLADDER: Cortical renal scarring bilaterally. No stones in the kidneys or ureters. No hydronephrosis. No perinephric or periureteral stranding. Urinary bladder is unremarkable. GI AND BOWEL: Diffuse wall thickening and mucosal hyperenhancement of the colon greatest about the sigmoid colon and rectum. Diffuse dilation of the small bowel with tapering at the small bowel - small bowel anastomosis in the right lower quadrant. Findings may be due to ileus or partial obstruction at the anastomosis. PERITONEUM AND RETROPERITONEUM: Small volume of abdominal pelvic ascites. No free air. VASCULATURE: Aorta is normal in caliber. LYMPH NODES: No lymphadenopathy. REPRODUCTIVE ORGANS: No acute abnormality. BONES AND SOFT TISSUES: No acute osseous abnormality. No focal soft tissue abnormality. IMPRESSION: 1. Possible filling defect in a right lower lobe  pulmonary artery, potentially representing artifact versus pulmonary embolism. Dedicated CTA of the chest is recommended. 2. Diffuse dilation of the small bowel with tapering at the small bowel-small bowel anastomosis in the right lower quadrant, possibly due to ileus or partial obstruction at the anastomosis. 3. Pancolitis greatest about the sigmoid colon and rectum, favored infectious / inflammatory. 4. Small volume of abdominal and pelvic ascites. 5. Large left adrenal heterogeneous mass. As recommended previously, surgical consultation and biochemical evaluation for functioning adrenal region such as pheochromocytoma is recommended. Electronically signed by: Norman Gatlin MD 12/10/2023 12:51 AM EST RP Workstation: [id number]     Procedures   CRITICAL CARE Performed by: Olam CHRISTELLA Jarold   Total critical care time: 65 minutes  Critical care time was exclusive of separately billable procedures and treating other patients.  Critical care was necessary to treat or prevent imminent or life-threatening deterioration.  Critical care  was time spent personally by me on the following activities: development of treatment plan with patient and/or surrogate as well as nursing, discussions with consultants, evaluation of patient's response to treatment, examination of patient, obtaining history from patient or surrogate, ordering and performing treatments and interventions, ordering and review of laboratory studies, ordering and review of radiographic studies, pulse oximetry and re-evaluation of patient's condition.   Medications Ordered in the ED  metroNIDAZOLE  (FLAGYL ) IVPB 500 mg (500 mg Intravenous New Bag/Given 12/10/23 0142)  sodium chloride  0.9 % bolus 1,000 mL (1,000 mLs Intravenous New Bag/Given 12/09/23 2339)  ondansetron  (ZOFRAN ) injection 4 mg (4 mg Intravenous Given 12/09/23 2339)  fentaNYL  (SUBLIMAZE ) injection 50 mcg (50 mcg Intravenous Given 12/09/23 2339)  potassium chloride  10 mEq in 100 mL  IVPB (0 mEq Intravenous Stopped 12/10/23 0154)  iohexol  (OMNIPAQUE ) 350 MG/ML injection 75 mL (75 mLs Intravenous Contrast Given 12/10/23 0028)  fentaNYL  (SUBLIMAZE ) injection 50 mcg (50 mcg Intravenous Given 12/10/23 0044)  cefTRIAXone  (ROCEPHIN ) 2 g in sodium chloride  0.9 % 100 mL IVPB (0 g Intravenous Stopped 12/10/23 0154)  magnesium  sulfate IVPB 2 g 50 mL (2 g Intravenous New Bag/Given 12/10/23 0106)  iohexol  (OMNIPAQUE ) 350 MG/ML injection 75 mL (75 mLs Intravenous Contrast Given 12/10/23 0212)                                    Medical Decision Making Amount and/or Complexity of Data Reviewed Labs: ordered. Radiology: ordered and independent interpretation performed. ECG/medicine tests: ordered and independent interpretation performed.  Risk OTC drugs. Prescription drug management. Decision regarding hospitalization.   58 year old female presenting to the ED with abdominal pain, nausea, vomiting, and diarrhea for the past several days.  Recently underwent ex lap and small bowel resection due to internal hernia and SBO.  Sounds like she has been doing quite poorly since discharge.  She is afebrile and nontoxic here but seems quite weak.  She appears clinically dry.  She is tachycardic with soft BP.  She is mentating appropriately.  Midline incision with staples still in place, no apparent dehiscence.  There is small amount of serosanguineous drainage along midportion of incision.  She does appear quite distended on exam.  Plan for labs, repeat CT.  She is given IV fluids, antiemetics.  Small dose of fentanyl .  Labs as above-- WBC 27K. K+ 2.6-- likely from poor intake.  Given IV replacement as not tolerating PO, add on Mg+.  Normal lipase.  UA without signs of infection.  CT pending.  12:59 AM CT with findings of dilated small bowel, questionable ileus vs partial SBO as well as pancolitis, favored infectious/inflammatory.  Will add on rocephin /flagyl , obtain stool studies as she has been  having water diarrhea here.  Also has incidental finding of possible filling defect on CT, recommended CTA to assess for possible PE as well.  This has been ordered.  Mg+ low at 1.3, given IV replacement for this as well.  1:22 AM Spoke with on call general surgery, Dr. Tanda-- ok for NG tube, correct electrolytes, admit to medicine.  Surgery team can evaluate in the morning for consultation.  CTA confirms acute segmental and subsegmental PE's bilaterally.  No evidence of heart strain.  She remains HD stable on RA currently.  I have discussed multiple imaging abnormalities on CT chest and abdomen/pelvis along with lab abnormalities with patient.  She voiced understanding of this. She is not keen on  the idea of NG tube currently, however she voiced understanding this is needed to try and decompress bowel and avoid another potential surgery.  Will discuss with hospitalist for admission.  Discussed with hospitalist, Dr. Marcene-- will admit for ongoing care.  Final diagnoses:  SBO (small bowel obstruction) (HCC)  Multiple subsegmental pulmonary emboli without acute cor pulmonale (HCC)  Hypokalemia  Hypomagnesemia    ED Discharge Orders     None          Jarold Olam HERO, PA-C 12/10/23 [id number]    Freddi Hamilton, MD 12/16/23 [phone]

## 2023-12-09 NOTE — ED Triage Notes (Signed)
 Pt BIB EMS from home for increased nausea/vomiting and increased drainage at incision site. Pt recently had surgery to for a SBO. Pt is complaining of 10/10 pain upon arrival.

## 2023-12-09 NOTE — ED Notes (Signed)
 In and out cath attempted to obtain urine sample.

## 2023-12-10 ENCOUNTER — Inpatient Hospital Stay (HOSPITAL_COMMUNITY)

## 2023-12-10 ENCOUNTER — Emergency Department (HOSPITAL_COMMUNITY)

## 2023-12-10 ENCOUNTER — Telehealth (HOSPITAL_COMMUNITY): Payer: Self-pay

## 2023-12-10 ENCOUNTER — Other Ambulatory Visit (HOSPITAL_COMMUNITY): Payer: Self-pay

## 2023-12-10 DIAGNOSIS — J9601 Acute respiratory failure with hypoxia: Secondary | ICD-10-CM | POA: Diagnosis not present

## 2023-12-10 DIAGNOSIS — E876 Hypokalemia: Secondary | ICD-10-CM | POA: Diagnosis not present

## 2023-12-10 DIAGNOSIS — E1165 Type 2 diabetes mellitus with hyperglycemia: Secondary | ICD-10-CM | POA: Diagnosis not present

## 2023-12-10 DIAGNOSIS — Z86718 Personal history of other venous thrombosis and embolism: Secondary | ICD-10-CM

## 2023-12-10 DIAGNOSIS — E279 Disorder of adrenal gland, unspecified: Secondary | ICD-10-CM | POA: Diagnosis not present

## 2023-12-10 DIAGNOSIS — Z720 Tobacco use: Secondary | ICD-10-CM | POA: Diagnosis not present

## 2023-12-10 DIAGNOSIS — I2694 Multiple subsegmental pulmonary emboli without acute cor pulmonale: Secondary | ICD-10-CM | POA: Diagnosis not present

## 2023-12-10 DIAGNOSIS — I2699 Other pulmonary embolism without acute cor pulmonale: Secondary | ICD-10-CM

## 2023-12-10 DIAGNOSIS — I2693 Single subsegmental pulmonary embolism without acute cor pulmonale: Secondary | ICD-10-CM | POA: Diagnosis not present

## 2023-12-10 DIAGNOSIS — I10 Essential (primary) hypertension: Secondary | ICD-10-CM | POA: Diagnosis not present

## 2023-12-10 DIAGNOSIS — J810 Acute pulmonary edema: Secondary | ICD-10-CM | POA: Diagnosis not present

## 2023-12-10 DIAGNOSIS — Z66 Do not resuscitate: Secondary | ICD-10-CM | POA: Diagnosis not present

## 2023-12-10 DIAGNOSIS — A419 Sepsis, unspecified organism: Secondary | ICD-10-CM | POA: Diagnosis not present

## 2023-12-10 DIAGNOSIS — I4729 Other ventricular tachycardia: Secondary | ICD-10-CM | POA: Diagnosis present

## 2023-12-10 DIAGNOSIS — R6520 Severe sepsis without septic shock: Secondary | ICD-10-CM | POA: Diagnosis not present

## 2023-12-10 DIAGNOSIS — K567 Ileus, unspecified: Secondary | ICD-10-CM | POA: Diagnosis not present

## 2023-12-10 DIAGNOSIS — R188 Other ascites: Secondary | ICD-10-CM | POA: Diagnosis not present

## 2023-12-10 DIAGNOSIS — Z4682 Encounter for fitting and adjustment of non-vascular catheter: Secondary | ICD-10-CM | POA: Diagnosis not present

## 2023-12-10 DIAGNOSIS — E872 Acidosis, unspecified: Secondary | ICD-10-CM | POA: Diagnosis not present

## 2023-12-10 DIAGNOSIS — Z7901 Long term (current) use of anticoagulants: Secondary | ICD-10-CM | POA: Diagnosis not present

## 2023-12-10 DIAGNOSIS — J439 Emphysema, unspecified: Secondary | ICD-10-CM | POA: Diagnosis not present

## 2023-12-10 DIAGNOSIS — I82411 Acute embolism and thrombosis of right femoral vein: Secondary | ICD-10-CM | POA: Diagnosis not present

## 2023-12-10 DIAGNOSIS — R6510 Systemic inflammatory response syndrome (SIRS) of non-infectious origin without acute organ dysfunction: Secondary | ICD-10-CM | POA: Diagnosis not present

## 2023-12-10 DIAGNOSIS — I472 Ventricular tachycardia, unspecified: Secondary | ICD-10-CM | POA: Diagnosis not present

## 2023-12-10 DIAGNOSIS — K529 Noninfective gastroenteritis and colitis, unspecified: Secondary | ICD-10-CM | POA: Diagnosis not present

## 2023-12-10 DIAGNOSIS — K56609 Unspecified intestinal obstruction, unspecified as to partial versus complete obstruction: Secondary | ICD-10-CM | POA: Diagnosis not present

## 2023-12-10 DIAGNOSIS — R112 Nausea with vomiting, unspecified: Secondary | ICD-10-CM | POA: Diagnosis not present

## 2023-12-10 DIAGNOSIS — K56600 Partial intestinal obstruction, unspecified as to cause: Secondary | ICD-10-CM | POA: Diagnosis not present

## 2023-12-10 DIAGNOSIS — E278 Other specified disorders of adrenal gland: Secondary | ICD-10-CM | POA: Diagnosis not present

## 2023-12-10 DIAGNOSIS — Z8673 Personal history of transient ischemic attack (TIA), and cerebral infarction without residual deficits: Secondary | ICD-10-CM | POA: Diagnosis not present

## 2023-12-10 DIAGNOSIS — J189 Pneumonia, unspecified organism: Secondary | ICD-10-CM | POA: Diagnosis not present

## 2023-12-10 DIAGNOSIS — K9189 Other postprocedural complications and disorders of digestive system: Secondary | ICD-10-CM | POA: Diagnosis not present

## 2023-12-10 DIAGNOSIS — I82431 Acute embolism and thrombosis of right popliteal vein: Secondary | ICD-10-CM | POA: Diagnosis not present

## 2023-12-10 LAB — COMPREHENSIVE METABOLIC PANEL WITH GFR
ALT: 11 U/L (ref 0–44)
ALT: 11 U/L (ref 0–44)
AST: 13 U/L — ABNORMAL LOW (ref 15–41)
AST: 11 U/L — ABNORMAL LOW (ref 15–41)
Albumin: 2.2 g/dL — ABNORMAL LOW (ref 3.5–5.0)
Albumin: 2 g/dL — ABNORMAL LOW (ref 3.5–5.0)
Alkaline Phosphatase: 68 U/L (ref 38–126)
Alkaline Phosphatase: 57 U/L (ref 38–126)
Anion gap: 11 (ref 5–15)
Anion gap: 10 (ref 5–15)
BUN: 10 mg/dL (ref 6–20)
BUN: 10 mg/dL (ref 6–20)
CO2: 22 mmol/L (ref 22–32)
CO2: 19 mmol/L — ABNORMAL LOW (ref 22–32)
Calcium: 6.9 mg/dL — ABNORMAL LOW (ref 8.9–10.3)
Calcium: 6.8 mg/dL — ABNORMAL LOW (ref 8.9–10.3)
Chloride: 104 mmol/L (ref 98–111)
Chloride: 108 mmol/L (ref 98–111)
Creatinine, Ser: 0.75 mg/dL (ref 0.44–1.00)
Creatinine, Ser: 0.72 mg/dL (ref 0.44–1.00)
GFR, Estimated: >60 mL/min (ref >60)
GFR, Estimated: >60 mL/min (ref >60)
Glucose, Bld: 136 mg/dL — ABNORMAL HIGH (ref 70–99)
Glucose, Bld: 119 mg/dL — ABNORMAL HIGH (ref 70–99)
Potassium: 2.6 mmol/L — CL (ref 3.5–5.1)
Potassium: 3 mmol/L — ABNORMAL LOW (ref 3.5–5.1)
Sodium: 137 mmol/L (ref 135–145)
Sodium: 137 mmol/L (ref 135–145)
Total Bilirubin: 0.5 mg/dL (ref 0.0–1.2)
Total Bilirubin: 0.4 mg/dL (ref 0.0–1.2)
Total Protein: 4.9 g/dL — ABNORMAL LOW (ref 6.5–8.1)
Total Protein: 4.5 g/dL — ABNORMAL LOW (ref 6.5–8.1)

## 2023-12-10 LAB — CBC WITH DIFFERENTIAL/PLATELET
Abs Immature Granulocytes: 0.3 K/uL — ABNORMAL HIGH (ref 0.00–0.07)
Basophils Absolute: 0.1 K/uL (ref 0.0–0.1)
Basophils Relative: 0 %
Eosinophils Absolute: 0.3 K/uL (ref 0.0–0.5)
Eosinophils Relative: 1 %
HCT: 38.5 % (ref 36.0–46.0)
Hemoglobin: 12.9 g/dL (ref 12.0–15.0)
Immature Granulocytes: 1 %
Lymphocytes Relative: 11 %
Lymphs Abs: 2.8 K/uL (ref 0.7–4.0)
MCH: 31.9 pg (ref 26.0–34.0)
MCHC: 33.5 g/dL (ref 30.0–36.0)
MCV: 95.1 fL (ref 80.0–100.0)
Monocytes Absolute: 1.4 K/uL — ABNORMAL HIGH (ref 0.1–1.0)
Monocytes Relative: 5 %
Neutro Abs: 21.6 K/uL — ABNORMAL HIGH (ref 1.7–7.7)
Neutrophils Relative %: 82 %
Platelets: 384 K/uL (ref 150–400)
RBC: 4.05 MIL/uL (ref 3.87–5.11)
RDW: 13.9 % (ref 11.5–15.5)
Smear Review: NORMAL
WBC: 26.4 K/uL — ABNORMAL HIGH (ref 4.0–10.5)
nRBC: 0 % (ref 0.0–0.2)

## 2023-12-10 LAB — GASTROINTESTINAL PANEL BY PCR, STOOL (REPLACES STOOL CULTURE)

## 2023-12-10 LAB — C DIFFICILE QUICK SCREEN W PCR REFLEX
C Diff antigen: NEGATIVE
C Diff interpretation: NOT DETECTED
C Diff toxin: NEGATIVE

## 2023-12-10 LAB — BASIC METABOLIC PANEL WITH GFR
Anion gap: 8 (ref 5–15)
BUN: 7 mg/dL (ref 6–20)
CO2: 24 mmol/L (ref 22–32)
Calcium: 7.5 mg/dL — ABNORMAL LOW (ref 8.9–10.3)
Chloride: 108 mmol/L (ref 98–111)
Creatinine, Ser: 0.68 mg/dL (ref 0.44–1.00)
GFR, Estimated: >60 mL/min (ref >60)
Glucose, Bld: 96 mg/dL (ref 70–99)
Potassium: 2.9 mmol/L — ABNORMAL LOW (ref 3.5–5.1)
Sodium: 140 mmol/L (ref 135–145)

## 2023-12-10 LAB — ECHOCARDIOGRAM COMPLETE
Area-P 1/2: 4.21 cm2
S' Lateral: 2.5 cm

## 2023-12-10 LAB — URINALYSIS, ROUTINE W REFLEX MICROSCOPIC
Bacteria, UA: NONE SEEN
Bilirubin Urine: NEGATIVE
Glucose, UA: 50 mg/dL — AB
Hgb urine dipstick: NEGATIVE
Ketones, ur: 5 mg/dL — AB
Leukocytes,Ua: NEGATIVE
Nitrite: NEGATIVE
Protein, ur: 30 mg/dL — AB
Specific Gravity, Urine: 1.015 (ref 1.005–1.030)
pH: 5 (ref 5.0–8.0)

## 2023-12-10 LAB — HEPARIN LEVEL (UNFRACTIONATED)
Heparin Unfractionated: <0.1 [IU]/mL — ABNORMAL LOW (ref 0.30–0.70)
Heparin Unfractionated: 0.1 [IU]/mL — ABNORMAL LOW (ref 0.30–0.70)

## 2023-12-10 LAB — GLUCOSE, CAPILLARY
Glucose-Capillary: 83 mg/dL (ref 70–99)
Glucose-Capillary: 89 mg/dL (ref 70–99)
Glucose-Capillary: 95 mg/dL (ref 70–99)

## 2023-12-10 LAB — MAGNESIUM: Magnesium: 1.3 mg/dL — ABNORMAL LOW (ref 1.7–2.4)

## 2023-12-10 LAB — CBG MONITORING, ED: Glucose-Capillary: 136 mg/dL — ABNORMAL HIGH (ref 70–99)

## 2023-12-10 LAB — LACTIC ACID, PLASMA: Lactic Acid, Venous: 1.1 mmol/L (ref 0.5–1.9)

## 2023-12-10 MED ORDER — HEPARIN (PORCINE) 25000 UT/250ML-% IV SOLN
1450.0000 [IU]/h | INTRAVENOUS | Status: DC
  Administered 2023-12-10: 1300 [IU]/h via INTRAVENOUS
  Administered 2023-12-10: 950 [IU]/h via INTRAVENOUS
  Administered 2023-12-11: 1400 [IU]/h via INTRAVENOUS
  Administered 2023-12-12 – 2023-12-14 (×3): 1450 [IU]/h via INTRAVENOUS
  Filled 2023-12-10 (×7): qty 250

## 2023-12-10 MED ORDER — CHEWING GUM (ORBIT) SUGAR FREE
1.0000 | CHEWING_GUM | ORAL | Status: DC | PRN
  Filled 2023-12-10: qty 1

## 2023-12-10 MED ORDER — IOHEXOL 350 MG/ML SOLN
75.0000 mL | Freq: Once | INTRAVENOUS | Status: AC | PRN
  Administered 2023-12-10: 75 mL via INTRAVENOUS

## 2023-12-10 MED ORDER — HEPARIN BOLUS VIA INFUSION
2000.0000 [IU] | Freq: Once | INTRAVENOUS | Status: AC
  Administered 2023-12-10: 2000 [IU] via INTRAVENOUS
  Filled 2023-12-10: qty 2000

## 2023-12-10 MED ORDER — POTASSIUM CHLORIDE 10 MEQ/100ML IV SOLN
10.0000 meq | INTRAVENOUS | Status: AC
  Administered 2023-12-10 (×4): 10 meq via INTRAVENOUS
  Filled 2023-12-10 (×4): qty 100

## 2023-12-10 MED ORDER — LIDOCAINE VISCOUS HCL 2 % MT SOLN
15.0000 mL | Freq: Once | OROMUCOSAL | Status: AC
  Administered 2023-12-10: 15 mL via OROMUCOSAL
  Filled 2023-12-10: qty 15

## 2023-12-10 MED ORDER — NALOXONE HCL 0.4 MG/ML IJ SOLN: 0.4000 mg | INTRAMUSCULAR | Status: DC | PRN

## 2023-12-10 MED ORDER — MAGNESIUM SULFATE 2 GM/50ML IV SOLN
2.0000 g | Freq: Once | INTRAVENOUS | Status: AC
  Administered 2023-12-10: 2 g via INTRAVENOUS
  Filled 2023-12-10: qty 50

## 2023-12-10 MED ORDER — HEPARIN BOLUS VIA INFUSION
2500.0000 [IU] | Freq: Once | INTRAVENOUS | Status: AC
  Administered 2023-12-10: 2500 [IU] via INTRAVENOUS
  Filled 2023-12-10: qty 2500

## 2023-12-10 MED ORDER — SODIUM CHLORIDE 0.9 % IV SOLN
2.0000 g | INTRAVENOUS | Status: DC
  Administered 2023-12-10 – 2023-12-16 (×7): 2 g via INTRAVENOUS
  Filled 2023-12-10 (×7): qty 20

## 2023-12-10 MED ORDER — FENTANYL CITRATE (PF) 50 MCG/ML IJ SOSY
50.0000 ug | PREFILLED_SYRINGE | Freq: Once | INTRAMUSCULAR | Status: AC
  Administered 2023-12-10: 50 ug via INTRAVENOUS
  Filled 2023-12-10: qty 1

## 2023-12-10 MED ORDER — ACETAMINOPHEN 650 MG RE SUPP
650.0000 mg | Freq: Four times a day (QID) | RECTAL | Status: DC | PRN
  Administered 2023-12-11: 650 mg via RECTAL
  Filled 2023-12-10: qty 1

## 2023-12-10 MED ORDER — LACTATED RINGERS IV BOLUS
500.0000 mL | Freq: Once | INTRAVENOUS | Status: AC
  Administered 2023-12-10: 500 mL via INTRAVENOUS

## 2023-12-10 MED ORDER — METRONIDAZOLE 500 MG/100ML IV SOLN
500.0000 mg | Freq: Once | INTRAVENOUS | Status: AC
  Administered 2023-12-10: 500 mg via INTRAVENOUS
  Filled 2023-12-10: qty 100

## 2023-12-10 MED ORDER — INSULIN ASPART 100 UNIT/ML IJ SOLN
0.0000 [IU] | Freq: Four times a day (QID) | INTRAMUSCULAR | Status: DC
  Administered 2023-12-12 – 2023-12-16 (×7): 1 [IU] via SUBCUTANEOUS
  Administered 2023-12-17: 2 [IU] via SUBCUTANEOUS
  Administered 2023-12-17 (×2): 1 [IU] via SUBCUTANEOUS
  Filled 2023-12-10 (×8): qty 1
  Filled 2023-12-10: qty 2
  Filled 2023-12-10: qty 1

## 2023-12-10 MED ORDER — PANTOPRAZOLE SODIUM 40 MG IV SOLR
40.0000 mg | Freq: Two times a day (BID) | INTRAVENOUS | Status: DC
  Administered 2023-12-10 – 2023-12-17 (×15): 40 mg via INTRAVENOUS
  Filled 2023-12-10 (×15): qty 10

## 2023-12-10 MED ORDER — BENZOCAINE 20 % MT AERO
INHALATION_SPRAY | Freq: Once | OROMUCOSAL | Status: AC
  Filled 2023-12-10: qty 57

## 2023-12-10 MED ORDER — METOPROLOL TARTRATE 5 MG/5ML IV SOLN
2.5000 mg | Freq: Four times a day (QID) | INTRAVENOUS | Status: DC
  Administered 2023-12-10 – 2023-12-13 (×12): 2.5 mg via INTRAVENOUS
  Filled 2023-12-10 (×12): qty 5

## 2023-12-10 MED ORDER — SODIUM CHLORIDE 0.9 % IV SOLN: INTRAVENOUS | Status: DC

## 2023-12-10 MED ORDER — ACETAMINOPHEN 325 MG PO TABS
650.0000 mg | ORAL_TABLET | Freq: Four times a day (QID) | ORAL | Status: DC | PRN
  Administered 2023-12-15: 650 mg via ORAL
  Filled 2023-12-10: qty 2

## 2023-12-10 MED ORDER — METRONIDAZOLE 500 MG/100ML IV SOLN
500.0000 mg | Freq: Two times a day (BID) | INTRAVENOUS | Status: DC
  Administered 2023-12-10 – 2023-12-17 (×15): 500 mg via INTRAVENOUS
  Filled 2023-12-10 (×16): qty 100

## 2023-12-10 MED ORDER — ONDANSETRON HCL 4 MG/2ML IJ SOLN
4.0000 mg | Freq: Four times a day (QID) | INTRAMUSCULAR | Status: DC | PRN
  Administered 2023-12-10 – 2023-12-12 (×2): 4 mg via INTRAVENOUS
  Filled 2023-12-10 (×2): qty 2

## 2023-12-10 MED ORDER — SODIUM CHLORIDE 0.9 % IV SOLN
2.0000 g | Freq: Once | INTRAVENOUS | Status: AC
  Administered 2023-12-10: 2 g via INTRAVENOUS
  Filled 2023-12-10: qty 20

## 2023-12-10 MED ORDER — SODIUM CHLORIDE 0.9 % IV SOLN: INTRAVENOUS | Status: AC

## 2023-12-10 MED ORDER — FENTANYL CITRATE (PF) 50 MCG/ML IJ SOSY
12.5000 ug | PREFILLED_SYRINGE | INTRAMUSCULAR | Status: DC | PRN
  Administered 2023-12-10 – 2023-12-14 (×9): 12.5 ug via INTRAVENOUS
  Filled 2023-12-10 (×10): qty 1

## 2023-12-10 MED ORDER — POTASSIUM CHLORIDE 10 MEQ/100ML IV SOLN
10.0000 meq | INTRAVENOUS | Status: AC
  Administered 2023-12-10 (×2): 10 meq via INTRAVENOUS
  Filled 2023-12-10 (×2): qty 100

## 2023-12-10 MED ORDER — CALCIUM GLUCONATE-NACL 2-0.675 GM/100ML-% IV SOLN
2.0000 g | Freq: Once | INTRAVENOUS | Status: AC
  Administered 2023-12-10: 2000 mg via INTRAVENOUS
  Filled 2023-12-10: qty 100

## 2023-12-10 NOTE — Progress Notes (Signed)
 Subjective: Patient known to our service for recent ex lap with SBR for ischemic bowel from internal hernia by Dr. Sebastian on 11/29/23.  She seemed to recover well and was discharged on POD 5.  She states she has been having diarrhea since her surgery.  This has been progressive and has at least 10 + BMs a day.  She denies any fevers/chills at home.  She had been tolerated a regular diet, but within the last several days has developed increasing abdominal distention with some vomiting.  The patient overall feels terrible and presented to the ED for evaluation.  She was noted to be tachy with some hypotension.  Her WBC is 27.4, potassium 2.6, cr normal.  She had a CT scan that reveals diffuse dilatation of the small bowel, possible ileus along with pancolitis, favored to be infectious/inflammatory with some volume abdominopelvic ascites.  She was also noted to have B PEs, confirmed on CTA.  We have been asked to see her for further evaluation.   Objective: Vital signs in last 24 hours: Temp:  [97.9 F (36.6 C)-98.6 F (37 C)] 98.4 F (36.9 C) (12/02 1056) Pulse Rate:  [96-144] 96 (12/02 1137) Resp:  [11-30] 15 (12/02 1137) BP: (84-106)/(54-71) 101/71 (12/02 1137) SpO2:  [92 %-100 %] 99 % (12/02 1137)    Intake/Output from previous day: 12/01 0701 - 12/02 0700 In: 1200 [IV Piggyback:1200] Out: 300  Intake/Output this shift: No intake/output data recorded.  PE: Gen: unwell appearing female, NAD currently lying in bed Heart: mildly tachy in low 100s, run of Vtach while I was present, asymptomatic Lungs: CTAB Abd: soft, distended, NGT in place with spit output currently, midline wound with some cloudy serous drainage from the mid portion.  Staples removed, top 2/3 of the wound opened up.  Base is pale, but no overt infection.  Fascia is intact.  Some diffuse tenderness, but no guarding/peritonitis.  Lab Results:  Recent Labs    12/09/23 2243 12/10/23 0338  WBC 27.4* 26.4*   HGB 12.6 12.9  HCT 39.3 38.5  PLT 368 384   BMET Recent Labs    12/10/23 0338 12/10/23 0913  NA 137 137  K 2.6* 3.0*  CL 104 108  CO2 22 19*  GLUCOSE 136* 119*  BUN 10 10  CREATININE 0.75 0.72  CALCIUM  6.9* 6.8*   PT/INR No results for input(s): LABPROT, INR in the last 72 hours. CMP     Component Value Date/Time   NA 137 12/10/2023 0913   NA 134 05/01/2021 1532   K 3.0 (L) 12/10/2023 0913   CL 108 12/10/2023 0913   CO2 19 (L) 12/10/2023 0913   GLUCOSE 119 (H) 12/10/2023 0913   BUN 10 12/10/2023 0913   BUN 8 05/01/2021 1532   CREATININE 0.72 12/10/2023 0913   CALCIUM  6.8 (L) 12/10/2023 0913   PROT 4.5 (L) 12/10/2023 0913   PROT 7.4 05/01/2021 1532   ALBUMIN  2.0 (L) 12/10/2023 0913   ALBUMIN  4.4 05/01/2021 1532   AST 11 (L) 12/10/2023 0913   ALT 11 12/10/2023 0913   ALKPHOS 57 12/10/2023 0913   BILITOT 0.4 12/10/2023 0913   BILITOT 0.3 05/01/2021 1532   GFRNONAA >60 12/10/2023 0913   GFRAA >60 02/28/2019 2041   Lipase     Component Value Date/Time   LIPASE 21 12/09/2023 2243       Studies/Results: ECHOCARDIOGRAM COMPLETE Result Date: 12/10/2023    ECHOCARDIOGRAM REPORT   Patient Name:   [facility]  D Irizarry Date of Exam: 12/10/2023 Medical Rec #:  [id number]         Height:       66.0 in Accession #:    [id number]        Weight:       137.6 lb Date of Birth:  10-20-1965         BSA:          1.706 m Patient Age:    58 years          BP:           96/62 mmHg Patient Gender: F                 HR:           103 bpm. Exam Location:  Inpatient Procedure: 2D Echo, Cardiac Doppler and Color Doppler (Both Spectral and Color            Flow Doppler were utilized during procedure). Indications:    Pulmonary Embolus  History:        Patient has prior history of Echocardiogram examinations, most                 recent 03/24/2021. Risk Factors:Diabetes and Hypertension. CKD.  Sonographer:    Philomena Daring Referring Phys: [id number] JUSTIN B HOWERTER IMPRESSIONS  1. Left  ventricular ejection fraction, by estimation, is 60 to 65%. The left ventricle has normal function. The left ventricle has no regional wall motion abnormalities. There is mild concentric left ventricular hypertrophy. Left ventricular diastolic parameters were normal.  2. Right ventricular systolic function is normal. The right ventricular size is normal.  3. The mitral valve is normal in structure. Moderate mitral valve regurgitation. No evidence of mitral stenosis.  4. The aortic valve is normal in structure. Aortic valve regurgitation is not visualized. Aortic valve sclerosis is present, with no evidence of aortic valve stenosis.  5. The inferior vena cava is normal in size with greater than 50% respiratory variability, suggesting right atrial pressure of 3 mmHg. FINDINGS  Left Ventricle: Left ventricular ejection fraction, by estimation, is 60 to 65%. The left ventricle has normal function. The left ventricle has no regional wall motion abnormalities. The left ventricular internal cavity size was normal in size. There is  mild concentric left ventricular hypertrophy. Left ventricular diastolic parameters were normal. Right Ventricle: The right ventricular size is normal. No increase in right ventricular wall thickness. Right ventricular systolic function is normal. Left Atrium: Left atrial size was normal in size. Right Atrium: Right atrial size was normal in size. Pericardium: There is no evidence of pericardial effusion. Mitral Valve: The mitral valve is normal in structure. Moderate mitral valve regurgitation. No evidence of mitral valve stenosis. Tricuspid Valve: The tricuspid valve is normal in structure. Tricuspid valve regurgitation is mild . No evidence of tricuspid stenosis. Aortic Valve: The aortic valve is normal in structure. Aortic valve regurgitation is not visualized. Aortic valve sclerosis is present, with no evidence of aortic valve stenosis. Pulmonic Valve: The pulmonic valve was normal in  structure. Pulmonic valve regurgitation is not visualized. No evidence of pulmonic stenosis. Aorta: The aortic root is normal in size and structure. Venous: The inferior vena cava is normal in size with greater than 50% respiratory variability, suggesting right atrial pressure of 3 mmHg. IAS/Shunts: No atrial level shunt detected by color flow Doppler.  LEFT VENTRICLE PLAX 2D LVIDd:         3.70 cm   Diastology  LVIDs:         2.50 cm   LV e' medial:    7.72 cm/s LV PW:         1.10 cm   LV E/e' medial:  24.9 LV IVS:        1.10 cm   LV e' lateral:   8.27 cm/s LVOT diam:     1.72 cm   LV E/e' lateral: 23.2 LV SV:         57 LV SV Index:   34 LVOT Area:     2.32 cm  RIGHT VENTRICLE             IVC RV Basal diam:  2.96 cm     IVC diam: 1.93 cm RV Mid diam:    2.06 cm RV S prime:     18.00 cm/s TAPSE (M-mode): 1.8 cm LEFT ATRIUM             Index        RIGHT ATRIUM           Index LA diam:        3.73 cm 2.19 cm/m   RA Area:     10.80 cm LA Vol (A2C):   45.4 ml 26.62 ml/m  RA Volume:   18.50 ml  10.85 ml/m LA Vol (A4C):   46.1 ml 27.03 ml/m LA Biplane Vol: 48.3 ml 28.32 ml/m  AORTIC VALVE LVOT Vmax:   149.00 cm/s LVOT Vmean:  99.200 cm/s LVOT VTI:    0.246 m  AORTA Ao Root diam: 2.72 cm Ao Asc diam:  3.54 cm MITRAL VALVE                TRICUSPID VALVE MV Area (PHT): 4.21 cm     TR Peak grad:   37.7 mmHg MV Decel Time: 180 msec     TR Vmax:        307.00 cm/s MV E velocity: 192.00 cm/s MV A velocity: 212.00 cm/s  SHUNTS MV E/A ratio:  0.91         Systemic VTI:  0.25 m                             Systemic Diam: 1.72 cm Kardie Tobb DO Electronically signed by Dub Huntsman DO Signature Date/Time: 12/10/2023/12:16:23 PM    Final    DG Abd Portable 1 View Result Date: 12/10/2023 EXAM: 1 VIEW XRAY OF THE ABDOMEN 12/10/2023 03:30:53 AM COMPARISON: 11/29/2023 CLINICAL HISTORY: NG Tube verification FINDINGS: LINES, TUBES AND DEVICES: Enteric tube in place with side port in the distal esophagus and tip overlying the  expected region of the gastric lumen. Loop recorder device overlying the left chest. Midline surgical staples noted. BOWEL: Partially visualized dilated small bowel loops in the upper abdomen. SOFT TISSUES: No opaque urinary calculi. BONES: No acute osseous abnormality. IMPRESSION: 1. Enteric tube in place with side port in the distal esophagus and tip overlying the expected region of the gastric lumen. Recommended advancement approximately 5 cm. Electronically signed by: Norman Gatlin MD 12/10/2023 03:38 AM EST RP Workstation: [id number]   CT Angio Chest PE W and/or Wo Contrast Result Date: 12/10/2023 EXAM: CTA CHEST 12/10/2023 02:11:50 AM TECHNIQUE: CTA of the chest was performed after the administration of 75 mL of iohexol  (OMNIPAQUE ) 350 MG/ML injection. Multiplanar reformatted images are provided for review. MIP images are provided for review. Automated exposure control, iterative reconstruction, and/or weight based adjustment  of the mA/kV was utilized to reduce the radiation dose to as low as reasonably achievable. COMPARISON: CTA chest 11/28/2023. CLINICAL HISTORY: poss filling defect on abdominal CT, rads recommended follow-up im aging. FINDINGS: PULMONARY ARTERIES: Pulmonary arteries are adequately opacified for evaluation. Acute segmental and subsegmental pulmonary emboli in the bilateral lower lobes and left upper lobe pulmonary arteries. The RV measures 3.5 cm and the LV measures 3.9 cm. RV/LV ratio is 0.9. No evidence of right heart strain. Main pulmonary artery is normal in caliber. MEDIASTINUM: The heart and pericardium demonstrate no acute abnormality. There is no acute abnormality of the thoracic aorta. LYMPH NODES: No mediastinal, hilar or axillary lymphadenopathy. LUNGS AND PLEURA: Moderate emphysema. No focal consolidation or pulmonary edema. No evidence of pleural effusion or pneumothorax. UPPER ABDOMEN: Findings in the upper abdomen not substantially changed from same day CT abdomen and  pelvis. See that record for details. SOFT TISSUES AND BONES: No acute bone or soft tissue abnormality. IMPRESSION: 1. Acute segmental and subsegmental pulmonary emboli in the bilateral lower lobes and left upper lobe pulmonary arteries. No evidence of right heart strain (RV/LV ratio 0.9). 2. Moderate emphysema. Given that pulmonary emphysema is an independent risk factor for lung cancer in patients aged 59, consider evaluation for a low-dose CT lung cancer screening program. 3. Critical value/emergent results were called by telephone at the time of interpretation on 12/10/2023 at 2:20 AM to Dr. Jarold, who verbally acknowledged these results. Electronically signed by: Norman Gatlin MD 12/10/2023 02:23 AM EST RP Workstation: [id number]   CT ABDOMEN PELVIS W CONTRAST Result Date: 12/10/2023 EXAM: CT ABDOMEN AND PELVIS WITH CONTRAST 12/10/2023 12:28:18 AM TECHNIQUE: CT of the abdomen and pelvis was performed with the administration of intravenous contrast. 75 mL of iohexol  (OMNIPAQUE ) 350 MG/ML injection was administered. Multiplanar reformatted images are provided for review. Automated exposure control, iterative reconstruction, and/or weight-based adjustment of the mA/kV was utilized to reduce the radiation dose to as low as reasonably achievable. COMPARISON: CT dated 11/28/2023. CLINICAL HISTORY: Abdominal pain, post-op; recent SBO surgery, increased pain and N/V. FINDINGS: LOWER CHEST: Possible filling defect in a right lower lobe pulmonary artery on series 3 image 1. This may be due to artifact versus pulmonary embolism. Dedicated CTA of the chest is recommended. LIVER: Hypoattenuating focus along the anterior margin of the right hepatic lobe near the falciform ligament may represent focal fat deposition. GALLBLADDER AND BILE DUCTS: Gallbladder is unremarkable. No biliary ductal dilatation. SPLEEN: No acute abnormality. PANCREAS: No acute abnormality. ADRENAL GLANDS: Unchanged heterogeneous left adrenal  nodule containing punctate calcifications. KIDNEYS, URETERS AND BLADDER: Cortical renal scarring bilaterally. No stones in the kidneys or ureters. No hydronephrosis. No perinephric or periureteral stranding. Urinary bladder is unremarkable. GI AND BOWEL: Diffuse wall thickening and mucosal hyperenhancement of the colon greatest about the sigmoid colon and rectum. Diffuse dilation of the small bowel with tapering at the small bowel - small bowel anastomosis in the right lower quadrant. Findings may be due to ileus or partial obstruction at the anastomosis. PERITONEUM AND RETROPERITONEUM: Small volume of abdominal pelvic ascites. No free air. VASCULATURE: Aorta is normal in caliber. LYMPH NODES: No lymphadenopathy. REPRODUCTIVE ORGANS: No acute abnormality. BONES AND SOFT TISSUES: No acute osseous abnormality. No focal soft tissue abnormality. IMPRESSION: 1. Possible filling defect in a right lower lobe pulmonary artery, potentially representing artifact versus pulmonary embolism. Dedicated CTA of the chest is recommended. 2. Diffuse dilation of the small bowel with tapering at the small bowel-small bowel anastomosis in the right  lower quadrant, possibly due to ileus or partial obstruction at the anastomosis. 3. Pancolitis greatest about the sigmoid colon and rectum, favored infectious / inflammatory. 4. Small volume of abdominal and pelvic ascites. 5. Large left adrenal heterogeneous mass. As recommended previously, surgical consultation and biochemical evaluation for functioning adrenal region such as pheochromocytoma is recommended. Electronically signed by: Norman Gatlin MD 12/10/2023 12:51 AM EST RP Workstation: [id number]    Anti-infectives: Anti-infectives (From admission, onward)    Start     Dose/Rate Route Frequency Ordered Stop   12/10/23 2200  cefTRIAXone  (ROCEPHIN ) 2 g in sodium chloride  0.9 % 100 mL IVPB        2 g 200 mL/hr over 30 Minutes Intravenous Every 24 hours 12/10/23 0303     12/10/23  1000  metroNIDAZOLE  (FLAGYL ) IVPB 500 mg        500 mg 100 mL/hr over 60 Minutes Intravenous Every 12 hours 12/10/23 0303     12/10/23 0100  cefTRIAXone  (ROCEPHIN ) 2 g in sodium chloride  0.9 % 100 mL IVPB        2 g 200 mL/hr over 30 Minutes Intravenous  Once 12/10/23 0058 12/10/23 0154   12/10/23 0100  metroNIDAZOLE  (FLAGYL ) IVPB 500 mg        500 mg 100 mL/hr over 60 Minutes Intravenous  Once 12/10/23 0058 12/10/23 0302        Assessment/Plan S/p ex lap with SBR for internal hernia with ischemic bowel by Dr. Sebastian on 11/21 now with ileus and pancolitis -patient's history is concerning for c diff colitis given her WBC elevation as well as her extensive diarrhea.  Her first c. Diff test was negative.  We are going to resend this to verify it is truly negative -agree with abx therapy for now.  Currently on Rocephin /Flagyl .  Monitor WBC.  If this doesn't improve, then may need to transition to broader spectrum, unless her c diff returns positive. -cont NGT for now due to ileus pattern on her imaging.  Her anastomosis looks widely patent on her scan so doubt SBO or partial sbo -cont NPO, may have a few ice chips and gum.  She declines chloraseptic spray or lozenges. -wound now mostly open.  Will start BID dressing changes to this -cont to monitor.  No needs to return to the OR.   -if repeat c diff is negative and bowel distention improves, then may be able to try anti-diarrheal, but hesitant to do this given appearance on scan with colitis, ileus, etc.  FEN - NPO/NGT/IVFs VTE - heparin  gtt ID - Rocephin /Flagyl   --per TRH-- B PEs - on heparin  per medicine DM H/o CVA HTN   LOS: 0 days    Burnard FORBES Banter , [facility] Surgery 12/10/2023, 12:20 PM Please see Amion for pager number during day hours 7:00am-4:30pm or 7:00am -11:30am on weekends

## 2023-12-10 NOTE — Telephone Encounter (Signed)
 Pharmacy Patient Advocate Encounter  Insurance verification completed.    The patient is insured through [facility].     Ran test claim for Xarelto 20mg  and the current 30 day co-pay is $4.  Ran test claim for Eliquis  5mg  and the current 30 day co-pay is $4.   This test claim was processed through [employer]- copay amounts may vary at other pharmacies due to [employer], or as the patient moves through the different stages of their insurance plan.

## 2023-12-10 NOTE — ED Notes (Signed)
 Patient transported to CT

## 2023-12-10 NOTE — Progress Notes (Signed)
 PHARMACY - ANTICOAGULATION CONSULT NOTE  Pharmacy Consult for Heparin   Indication: pulmonary embolus  Allergies  Allergen Reactions   Amoxicillin Hives   Aspirin Hives   Codeine Hives   Penicillins Hives    Vital Signs: Temp: 98.3 F (36.8 C) (12/02 1551) Temp Source: Oral (12/02 1551) BP: 92/54 (12/02 1551) Pulse Rate: 96 (12/02 1551)  Labs: Recent Labs    12/09/23 2243 12/10/23 0338 12/10/23 0913 12/10/23 1102 12/10/23 1912  HGB 12.6 12.9  --   --   --   HCT 39.3 38.5  --   --   --   PLT 368 384  --   --   --   HEPARINUNFRC  --   --   --  <0.10* 0.10*  CREATININE 0.71 0.75 0.72  --  0.68    Estimated Creatinine Clearance: 70.9 mL/min (by C-G formula based on SCr of 0.68 mg/dL).   Medical History: Past Medical History:  Diagnosis Date   Acid reflux    Adrenal benign tumor    Apparently enlarging and patient is supposed to have it taken out   Anxiety    Depression    Diabetes mellitus without complication (HCC)    DVT (deep vein thrombosis) in pregnancy    Hypertension    Nausea vomiting and diarrhea 03/24/2021   PE (pulmonary embolism)    Stroke [facility])      Assessment: 58 y/o F 10 days post-op from small bowel resection presents to the ED with increased N/V and surgical site drainage. CT abdomen revealed a possible RLL pulmonary artery filling defect. A CT Angio study was then completed which showed bilateral PE. Start heparin . Labs above reviewed. PTA meds reviewed.   Heparin  level came back subtherapeutic at 0.1 after bolus and rate increase to 1100 units/hr. CBC wnl. We will repeat bolus and increase rate.   Goal of Therapy:  Heparin  level 0.3-0.7 units/ml Monitor platelets by anticoagulation protocol: Yes   Plan:  Heparin  2500 units x1 Increase heparin  drip to 1300 units/hr Heparin  level in 6 hours Daily CBC/Heparin  level Monitor for bleeding  Rocky Slade, PharmD, BCPS 12/10/2023 7:58 PM  Please check AMION for all [facility] Pharmacy phone  numbers After 10:00 PM, call Main Pharmacy [phone]

## 2023-12-10 NOTE — ED Notes (Signed)
 Patient cleaned and repositioned in bed after liquid bowel movement.

## 2023-12-10 NOTE — ED Notes (Addendum)
 Advanced NG tube 5cm per radiologist report.

## 2023-12-10 NOTE — Progress Notes (Signed)
 PHARMACY - ANTICOAGULATION CONSULT NOTE  Pharmacy Consult for Heparin   Indication: pulmonary embolus  Allergies  Allergen Reactions   Amoxicillin Hives   Aspirin Hives   Codeine Hives   Penicillins Hives    Vital Signs: Temp: 98.4 F (36.9 C) (12/02 1056) Temp Source: Oral (12/02 1056) BP: 101/71 (12/02 1137) Pulse Rate: 96 (12/02 1137)  Labs: Recent Labs    12/09/23 2243 12/10/23 0338 12/10/23 0913 12/10/23 1102  HGB 12.6 12.9  --   --   HCT 39.3 38.5  --   --   PLT 368 384  --   --   HEPARINUNFRC  --   --   --  <0.10*  CREATININE 0.71 0.75 0.72  --     Estimated Creatinine Clearance: 70.9 mL/min (by C-G formula based on SCr of 0.72 mg/dL).   Medical History: Past Medical History:  Diagnosis Date   Acid reflux    Adrenal benign tumor    Apparently enlarging and patient is supposed to have it taken out   Anxiety    Depression    Diabetes mellitus without complication (HCC)    DVT (deep vein thrombosis) in pregnancy    Hypertension    Nausea vomiting and diarrhea 03/24/2021   PE (pulmonary embolism)    Stroke [facility])      Assessment: 58 y/o F 10 days post-op from small bowel resection presents to the ED with increased N/V and surgical site drainage. CT abdomen revealed a possible RLL pulmonary artery filling defect. A CT Angio study was then completed which showed bilateral PE. Start heparin . Labs above reviewed. PTA meds reviewed.   Heparin  level came back undetectable. CBC wnl. We will repeat reduced dose bolus and increase rate.   Goal of Therapy:  Heparin  level 0.3-0.7 units/ml Monitor platelets by anticoagulation protocol: Yes   Plan:  Heparin  2000 units x1 Increase heparin  drip to 1100 units/hr Heparin  level in 6 hours Daily CBC/Heparin  level Monitor for bleeding  Sergio Batch, PharmD, BCIDP, AAHIVP, CPP Infectious Disease Pharmacist 12/10/2023 12:37 PM

## 2023-12-10 NOTE — Progress Notes (Signed)
 PHARMACY - ANTICOAGULATION CONSULT NOTE  Pharmacy Consult for Heparin   Indication: pulmonary embolus  Allergies  Allergen Reactions   Amoxicillin Hives   Aspirin Hives   Codeine Hives   Penicillins Hives    Vital Signs: Temp: 98.2 F (36.8 C) (12/02 0300) Temp Source: Oral (12/02 0300) BP: 105/70 (12/02 0300) Pulse Rate: 103 (12/02 0300)  Labs: Recent Labs    12/09/23 2243 12/10/23 0338  HGB 12.6 12.9  HCT 39.3 38.5  PLT 368 384  CREATININE 0.71  --     Estimated Creatinine Clearance: 70.9 mL/min (by C-G formula based on SCr of 0.71 mg/dL).   Medical History: Past Medical History:  Diagnosis Date   Acid reflux    Adrenal benign tumor    Apparently enlarging and patient is supposed to have it taken out   Anxiety    Depression    Diabetes mellitus without complication (HCC)    DVT (deep vein thrombosis) in pregnancy    Hypertension    Nausea vomiting and diarrhea 03/24/2021   PE (pulmonary embolism)    Stroke [facility])      Assessment: 58 y/o F 10 days post-op from small bowel resection presents to the ED with increased N/V and surgical site drainage. CT abdomen revealed a possible RLL pulmonary artery filling defect. A CT Angio study was then completed which showed bilateral PE. Start heparin . Labs above reviewed. PTA meds reviewed.   Goal of Therapy:  Heparin  level 0.3-0.7 units/ml Monitor platelets by anticoagulation protocol: Yes   Plan:  Heparin  2000 units BOLUS (reduced bolus given due to 10 days post-op) Start heparin  drip at 950 units/hr Heparin  level in 6-8 hours Daily CBC/Heparin  level Monitor for bleeding  Lynwood Mckusick, PharmD, BCPS Clinical Pharmacist Phone: [phone]

## 2023-12-10 NOTE — Progress Notes (Signed)
  Carryover admission to the Day Admitter.  I discussed this case with the EDP, Olam Slocumb, PA.  Per these discussions:   This is a 58 year old female who is postop day 10 status post ex lap with partial small bowel resection in setting of SBO/gangrenous bowel, who is being admitted this evening for recurrent small bowel obstruction with additional CT evidence of pancolitis after presenting with abdominal pain, nausea, vomiting, watery loose stool in the absence of melena or hematochezia.   CT abdomen/pelvis also was suggestive of potential pulmonary embolism, prompting CTA chest which showed evidence of bilateral pulmonary emboli without CTA evidence of right heart strain.  Started on heparin  drip.  Regarding her loose watery stool, C. difficile is negative, while GI panel by PCR result is currently pending.  Her CMP is notable for potassium of 2.6, creatinine 0.71 compared to 0.61 on 12/03/23.  Additionally her magnesium  is 1.3.  Her medical history is also notable for type 2 diabetes mellitus, with most recent hemoglobin A1c of 9.7% on 11/30/2023.  EDP d/w on-call general surgery, Dr. Tanda, who conveyed that general surgery will formally consult/see pt in the AM, and requested placement of NGT, which has been ordered.  In the ED, she has received Rocephin , IV Flagyl  in the setting of her pancolitis, as well as normal saline x 1 L bolus.  She is also received 20 mEq of IV potassium chloride  as well as 2 g of IV magnesium  sulfate.  Was started on heparin  drip for her acute bilateral pulmonary emboli..  I have placed an order for inpatient admission to PCU for further evaluation management of the above.  I have placed some additional preliminary admit orders via the adult multi-morbid admission order set. I have also ordered continuation of Rocephin , Flagyl .  I ordered normal saline at 150 cc/h x 12 hours, prn IV Zofran , prn IV fentanyl  for abdominal pain.  Have ordered additional potassium  chloride supplementation in the form of 40 mill equivalents IV over 4 hours.  Regarding her new diagnosis of acute bilateral pulmonary emboli, have continued her heparin  drip and ordered an echocardiogram for the morning.  I also ordered morning labs in the form of CMP, CBC.  She is currently n.p.o., and for her diabetes I have ordered every 6 hour Accu-Cheks with low-dose sliding scale insulin .  Additionally, in the setting of active MOST form reflecting DNR/DNI, I have set her code status as DNR/DNI, which is also consistent with code status documentation from most recent prior hospitalization.     Eva Pore, DO Hospitalist

## 2023-12-10 NOTE — ED Notes (Signed)
 Patient had large liquid bowel movement. Cleaned up, in and out cath for urine sample. Urine sent to lab.

## 2023-12-10 NOTE — H&P (Addendum)
 History and Physical    Patient: [name] DOB: 1965-04-22 DOA: 12/09/2023 DOS: the patient was seen and examined on 12/10/2023 PCP: Pcp, No  Patient coming from: Home via EMS  Chief Complaint:  Chief Complaint  Patient presents with   Nausea   Post-op Problem   HPI: [name] is a 58 y.o. female with medical history significant of hypertension, diabetes mellitus type 2, DVT/PE on chronic anticoagulation, left adrenal mass, and recent small bowel obstruction with gangrenous bowel requiring exploratory laparotomy with partial bowel resection presents with abdominal pain, diarrhea, and vomiting following recent bowel surgery for obstruction.  The patient has been experiencing abdominal pain, diarrhea, and vomiting since returning home after surgery for a bowel obstruction, which involved the removal of some bowel. The vomiting is described as dark brown in color, with a possible presence of blood. No drainage has been noticed from the surgical site.  She has a history of blood clots, including pulmonary embolism, but has not been on blood thinners. There is no leg pain reported, and she has been mobile since returning home.  She has a long history of smoking, approximately 40 years, smoking about a pack to a pack and a half per day. She has not used nicotine  patches during her hospital stay due to a previous adverse reaction.  In the emergency department patient was noted to be afebrile with heart rates elevated up to 144, respirations 30, blood pressures 84/59 -106/68, O2 saturation maintained on room air.  Labs since 12/1 significant for WBC 27.4, potassium 2.6, calcium  7, and magnesium  1.3.  CT scan of the abdomen and pelvis noted possible filling defect complicit in the right lower pulmonary artery representing artifact versus embolism, diffuse dilation of small bowel possibly secondary to ileus or partial obstruction at the anastomosis site, and pancolitis  greatest about the sigmoid colon and rectum favored infectious versus inflammatory, and large left adrenal heterogeneous mass.  CTA of the chest revealed acute segmental and subsegmental pulmonary emboli in the bilateral lower lobes and left upper lobe pulmonary arteries without evidence of right heart strain.  C. difficile testing was negative and GI panel was pending.  General surgery had been consulted and recommended placement of NG tube.  Patient was given 1.5 L of IV fluids, potassium chloride  20 mEq, Zofran , magnesium  sulfate 2 g, Rocephin , metronidazole , and have been started on a heparin  drip.  Review of Systems: As mentioned in the history of present illness. All other systems reviewed and are negative. Past Medical History:  Diagnosis Date   Acid reflux    Adrenal benign tumor    Apparently enlarging and patient is supposed to have it taken out   Anxiety    Depression    Diabetes mellitus without complication (HCC)    DVT (deep vein thrombosis) in pregnancy    Hypertension    Nausea vomiting and diarrhea 03/24/2021   PE (pulmonary embolism)    Stroke [facility])    Past Surgical History:  Procedure Laterality Date   BOWEL RESECTION  11/28/2023   Procedure: Resection of small bowel;  Surgeon: Sebastian Moles, MD;  Location: [facility] OR;  Service: General;;   DEBRIDEMENT AND CLOSURE WOUND Right 03/04/2019   Procedure: 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;  Surgeon: Shari Easter, MD;  Location: [facility] OR;  Service: Orthopedics;  Laterality: Right;  needs 60 min   LAPAROTOMY N/A 11/28/2023   Procedure: LAPAROTOMY, EXPLORATORY;  Surgeon: Sebastian Moles, MD;  Location: [facility] OR;  Service: General;  Laterality: N/A;   LOOP RECORDER IMPLANT N/A 03/25/2014   Procedure: LOOP RECORDER IMPLANT;  Surgeon: Elspeth JAYSON Sage, MD;  Location: [facility] CATH LAB;  Service: Cardiovascular;  Laterality: N/A;   NO PAST SURGERIES     TEE WITHOUT CARDIOVERSION N/A 03/25/2014    Procedure: TRANSESOPHAGEAL ECHOCARDIOGRAM (TEE);  Surgeon: Vinie JAYSON Maxcy, MD;  Location: [facility] ENDOSCOPY;  Service: Cardiovascular;  Laterality: N/A;   Social History:  reports that she has been smoking cigarettes. She has a 60 pack-year smoking history. She has never used smokeless tobacco. She reports that she does not drink alcohol and does not use drugs.  Allergies  Allergen Reactions   Amoxicillin Hives   Aspirin Hives   Codeine Hives   Penicillins Hives    Family History  Problem Relation Age of Onset   Heart attack Father 15    Prior to Admission medications   Medication Sig Start Date End Date Taking? Authorizing Provider  acetaminophen  (TYLENOL ) 500 MG tablet Take 2 tablets (1,000 mg total) by mouth every 6 (six) hours as needed for mild pain (pain score 1-3). 12/03/23   Al-Sultani, Anmar, MD  ibuprofen  (ADVIL ) 400 MG tablet Take 1 tablet (400 mg total) by mouth every 6 (six) hours as needed for moderate pain (pain score 4-6). 12/03/23   Al-Sultani, Anmar, MD  metFORMIN  (GLUCOPHAGE ) 500 MG tablet Take 1 tablet (500 mg total) by mouth 2 (two) times daily with a meal. 12/03/23   Al-Sultani, Anmar, MD  Multiple Vitamin (MULTIVITAMIN WITH MINERALS) TABS tablet Take 1 tablet by mouth daily. 12/03/23   Al-Sultani, Anmar, MD  ondansetron  (ZOFRAN -ODT) 4 MG disintegrating tablet Take 1 tablet (4 mg total) by mouth every 8 (eight) hours as needed for nausea or vomiting. 12/06/23   Tegeler, Lonni PARAS, MD  pantoprazole  (PROTONIX ) 40 MG tablet Take 1 tablet (40 mg total) by mouth at bedtime. 12/03/23 01/02/24  Al-Sultani, Anmar, MD  polyethylene glycol (MIRALAX  / GLYCOLAX ) 17 g packet Take 17 g by mouth daily as needed for mild constipation. 12/03/23   Mosie Ford, MD    Physical Exam: Vitals:   12/10/23 0530 12/10/23 0600 12/10/23 0702 12/10/23 0735  BP: 94/68 97/65 100/69 96/62  Pulse: 100 (!) 107 (!) 102 99  Resp: 16 (!) 22 20 (!) 21  Temp:   98.6 F (37 C) 98.3 F (36.8  C)  TempSrc:   Oral Oral  SpO2: 96% 92% 96% 92%   Constitutional: Middle-age female who appears acutely ill Eyes: PERRL, lids and conjunctivae normal ENMT: Mucous membranes are moist. P Normal dentition.  Neck: normal, supple,   Respiratory: clear to auscultation bilaterally, no wheezing, no crackles. Normal respiratory effort. No accessory muscle use.  Cardiovascular: Regular rate and rhythm, no murmurs / rubs / gallops.  1+ pitting lower extremity edema Abdomen: no tenderness, no masses palpated. No hepatosplenomegaly. Bowel sounds positive.  Musculoskeletal: no clubbing / cyanosis. No joint deformity upper and lower extremities. Good ROM, no contractures. Normal muscle tone.  Skin: no rashes, lesions, ulcers. No induration Neurologic: CN 2-12 grossly intact. Sensation intact, DTR normal. Strength 5/5 in all 4.  Psychiatric: Normal judgment and insight. Alert and oriented x 3. Normal mood.   Data Reviewed:  Reviewed labs, imaging, and pertinent records as documented  Assessment and Plan: SIRS/sepsis Pancolitis Ileus versus partial bowel obstruction Acute.  Patient just underwent partial bowel resection for small bowel obstruction with segmental bowel infarction on 11/20.  Patient presented back to  the hospital due to abdominal pain with nausea, vomiting, and diarrhea.  Noted to be tachycardic and tachypneic with soft blood pressures and WBC elevated 26.4.  Diffuse dilation of small bowel possibly secondary to ileus or partial obstruction at the anastomosis site, and pancolitis greatest about the sigmoid colon and rectum favored infectious versus inflammatory.  C. difficile studies were negative.  Surgery was consulted and NG tube was placed.  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  CT of the abdomen pelvis noted concern for pulmonary emboli.  Subsequent CTA of the chest revealed acute segmental and subsegmental pulmonary emboli in the bilateral lower lobes and left upper lobe pulmonary arteries without evidence of right heart strain.  Patient had been started on a heparin  drip.  Review of records note prior history of DVT and pulmonary emboli. Patient was started on a heparin  drip  - Continue heparin  per pharmacy - Check echocardiogram and Doppler ultrasound of lower extremity  Hypokalemia Hypocalcemia Hypomagnesemia Acute.  Labs noted potassium 2.6, calcium  6.9, magnesium  1.3.  Patient had been ordered potassium chloride  60 mEq IV, magnesium  sulfate 2 g IV. - Replating electrolytes  NSVT Patient noted to have runs of nonsustained V. tach.  Thought likely related with electrolyte abnormalities. - Correct electrolyte abnormalities - Started on low-dose metoprolol 2.5 mg every 6 hours  Uncontrolled diabetes mellitus type 2, without long-term use of insulin  Last hemoglobin A1c noted to be 9.7% on 11/22. - Hypoglycemic protocols - Hold metformin  - CBGs with very sensitive SSI - Adjust insulin  regimen as deemed medically appropriate  History of CVA Patient with prior history of strokes no reported residual symptoms.  Left adrenal mass Patient noted to have enlarging 5.9 cm left adrenal mass on imaging.  Recommended outpatient endocrine evaluation for functional workup and surgical consultation.  Tobacco abuse Patient reports smoking a pack and a half of cigarettes per day on average but declines need of nicotine  patch at this  time. -  DVT prophylaxis: Heparin  Advance Care Planning:   Code Status: Limited: Do not attempt resuscitation (DNR) -DNR-LIMITED -Do Not Intubate/DNI    Consults: Surgery  Family Communication:   Severity of Illness: The appropriate patient status for this patient is INPATIENT. Inpatient status is judged to be reasonable and necessary in order to provide the required intensity of service to ensure the patient's safety. The patient's presenting symptoms, physical exam findings, and initial radiographic and laboratory data in the context of their chronic comorbidities is felt to place them at high risk for further clinical deterioration. Furthermore, it is not anticipated that the patient will be medically stable for discharge from the hospital within 2 midnights of admission.   * I certify that at the point of admission it is my clinical judgment that the patient will require inpatient hospital care spanning beyond 2 midnights from the point of admission due to high intensity of service, high risk for further deterioration and high frequency of surveillance required.*  Author: Maximino DELENA Sharps, MD 12/10/2023 7:41 AM  For on call review [email or web address].

## 2023-12-11 ENCOUNTER — Inpatient Hospital Stay (HOSPITAL_COMMUNITY)

## 2023-12-11 DIAGNOSIS — K56600 Partial intestinal obstruction, unspecified as to cause: Secondary | ICD-10-CM | POA: Diagnosis not present

## 2023-12-11 DIAGNOSIS — J9601 Acute respiratory failure with hypoxia: Secondary | ICD-10-CM | POA: Diagnosis not present

## 2023-12-11 DIAGNOSIS — I2699 Other pulmonary embolism without acute cor pulmonale: Secondary | ICD-10-CM | POA: Diagnosis not present

## 2023-12-11 DIAGNOSIS — M7989 Other specified soft tissue disorders: Secondary | ICD-10-CM

## 2023-12-11 LAB — CBC
HCT: 38.7 % (ref 36.0–46.0)
Hemoglobin: 13 g/dL (ref 12.0–15.0)
MCH: 31.4 pg (ref 26.0–34.0)
MCHC: 33.6 g/dL (ref 30.0–36.0)
MCV: 93.5 fL (ref 80.0–100.0)
Platelets: 416 K/uL — ABNORMAL HIGH (ref 150–400)
RBC: 4.14 MIL/uL (ref 3.87–5.11)
RDW: 14.2 % (ref 11.5–15.5)
WBC: 15.6 K/uL — ABNORMAL HIGH (ref 4.0–10.5)
nRBC: 0 % (ref 0.0–0.2)

## 2023-12-11 LAB — BASIC METABOLIC PANEL WITH GFR
Anion gap: 14 (ref 5–15)
BUN: <5 mg/dL — ABNORMAL LOW (ref 6–20)
CO2: 20 mmol/L — ABNORMAL LOW (ref 22–32)
Calcium: 7.2 mg/dL — ABNORMAL LOW (ref 8.9–10.3)
Chloride: 106 mmol/L (ref 98–111)
Creatinine, Ser: 0.65 mg/dL (ref 0.44–1.00)
GFR, Estimated: >60 mL/min (ref >60)
Glucose, Bld: 84 mg/dL (ref 70–99)
Potassium: 2.7 mmol/L — CL (ref 3.5–5.1)
Sodium: 140 mmol/L (ref 135–145)

## 2023-12-11 LAB — GLUCOSE, CAPILLARY
Glucose-Capillary: 80 mg/dL (ref 70–99)
Glucose-Capillary: 88 mg/dL (ref 70–99)
Glucose-Capillary: 77 mg/dL (ref 70–99)
Glucose-Capillary: 72 mg/dL (ref 70–99)

## 2023-12-11 LAB — HEPARIN LEVEL (UNFRACTIONATED): Heparin Unfractionated: 0.35 [IU]/mL (ref 0.30–0.70)

## 2023-12-11 LAB — MAGNESIUM: Magnesium: 1.6 mg/dL — ABNORMAL LOW (ref 1.7–2.4)

## 2023-12-11 LAB — POTASSIUM: Potassium: 2.9 mmol/L — ABNORMAL LOW (ref 3.5–5.1)

## 2023-12-11 MED ORDER — POTASSIUM CHLORIDE 10 MEQ/100ML IV SOLN
10.0000 meq | INTRAVENOUS | Status: AC
  Administered 2023-12-11 (×4): 10 meq via INTRAVENOUS
  Filled 2023-12-11 (×4): qty 100

## 2023-12-11 MED ORDER — MAGNESIUM SULFATE 2 GM/50ML IV SOLN
2.0000 g | Freq: Once | INTRAVENOUS | Status: AC
  Administered 2023-12-11: 2 g via INTRAVENOUS
  Filled 2023-12-11: qty 50

## 2023-12-11 MED ORDER — CALCIUM GLUCONATE-NACL 2-0.675 GM/100ML-% IV SOLN
2.0000 g | Freq: Once | INTRAVENOUS | Status: AC
  Administered 2023-12-11: 2000 mg via INTRAVENOUS
  Filled 2023-12-11: qty 100

## 2023-12-11 MED ORDER — POTASSIUM CHLORIDE 10 MEQ/100ML IV SOLN
10.0000 meq | INTRAVENOUS | Status: AC
  Administered 2023-12-11 (×4): 10 meq via INTRAVENOUS
  Filled 2023-12-11 (×5): qty 100

## 2023-12-11 NOTE — Progress Notes (Signed)
 Venous duplex lower ext  has been completed. Refer to [facility] under chart review to view preliminary results.   12/11/2023  2:06 PM Carter Kaman, Ricka BIRCH

## 2023-12-11 NOTE — Progress Notes (Signed)
 Subjective: Patient continues to have generalized abdominal pain and distention. She is still having loose stools. She wants to eat something and wants NGT out.   Objective: Vital signs in last 24 hours: Temp:  [97.8 F (36.6 C)-98.4 F (36.9 C)] 97.9 F (36.6 C) (12/03 0326) Pulse Rate:  [84-96] 93 (12/03 0326) Resp:  [15-20] 17 (12/03 0605) BP: (92-110)/(54-74) 106/72 (12/03 0326) SpO2:  [95 %-100 %] 95 % (12/03 0326) Weight:  [57.1 kg] 57.1 kg (12/03 0605) Last BM Date : 12/10/23  Intake/Output from previous day: 12/02 0701 - 12/03 0700 In: 2920.9 [I.V.:2820.9; IV Piggyback:100] Out: 1350 [Urine:200; Emesis/NG output:1150] Intake/Output this shift: No intake/output data recorded.  PE: Gen: unwell appearing female, NAD currently lying in bed Heart: RRR Lungs: normal effort Abd: soft, distended, NGT in place with thin bilious, midline wound base is pale with some fibrinous exudate, but no overt infection.  Fascia is intact.  Some diffuse tenderness, but no guarding/peritonitis.  Lab Results:  Recent Labs    12/10/23 0338 12/11/23 0331  WBC 26.4* 15.6*  HGB 12.9 13.0  HCT 38.5 38.7  PLT 384 416*   BMET Recent Labs    12/10/23 1912 12/11/23 0934  NA 140 140  K 2.9* 2.7*  CL 108 106  CO2 24 20*  GLUCOSE 96 84  BUN 7 <5*  CREATININE 0.68 0.65  CALCIUM  7.5* 7.2*   PT/INR No results for input(s): LABPROT, INR in the last 72 hours. CMP     Component Value Date/Time   NA 140 12/11/2023 0934   NA 134 05/01/2021 1532   K 2.7 (LL) 12/11/2023 0934   CL 106 12/11/2023 0934   CO2 20 (L) 12/11/2023 0934   GLUCOSE 84 12/11/2023 0934   BUN <5 (L) 12/11/2023 0934   BUN 8 05/01/2021 1532   CREATININE 0.65 12/11/2023 0934   CALCIUM  7.2 (L) 12/11/2023 0934   PROT 4.5 (L) 12/10/2023 0913   PROT 7.4 05/01/2021 1532   ALBUMIN  2.0 (L) 12/10/2023 0913   ALBUMIN  4.4 05/01/2021 1532   AST 11 (L) 12/10/2023 0913   ALT 11 12/10/2023 0913   ALKPHOS 57  12/10/2023 0913   BILITOT 0.4 12/10/2023 0913   BILITOT 0.3 05/01/2021 1532   GFRNONAA >60 12/11/2023 0934   GFRAA >60 02/28/2019 2041   Lipase     Component Value Date/Time   LIPASE 21 12/09/2023 2243       Studies/Results: ECHOCARDIOGRAM COMPLETE Result Date: 12/10/2023    ECHOCARDIOGRAM REPORT   Patient Name:   TANNISHA KENNINGTON Date of Exam: 12/10/2023 Medical Rec #:  [id number]         Height:       66.0 in Accession #:    [id number]        Weight:       137.6 lb Date of Birth:  19-Dec-1965         BSA:          1.706 m Patient Age:    58 years          BP:           96/62 mmHg Patient Gender: F                 HR:           103 bpm. Exam Location:  Inpatient Procedure: 2D Echo, Cardiac Doppler and Color Doppler (Both Spectral and Color  Flow Doppler were utilized during procedure). Indications:    Pulmonary Embolus  History:        Patient has prior history of Echocardiogram examinations, most                 recent 03/24/2021. Risk Factors:Diabetes and Hypertension. CKD.  Sonographer:    Philomena Daring Referring Phys: [id number] JUSTIN B HOWERTER IMPRESSIONS  1. Left ventricular ejection fraction, by estimation, is 60 to 65%. The left ventricle has normal function. The left ventricle has no regional wall motion abnormalities. There is mild concentric left ventricular hypertrophy. Left ventricular diastolic parameters were normal.  2. Right ventricular systolic function is normal. The right ventricular size is normal.  3. The mitral valve is normal in structure. Moderate mitral valve regurgitation. No evidence of mitral stenosis.  4. The aortic valve is normal in structure. Aortic valve regurgitation is not visualized. Aortic valve sclerosis is present, with no evidence of aortic valve stenosis.  5. The inferior vena cava is normal in size with greater than 50% respiratory variability, suggesting right atrial pressure of 3 mmHg. FINDINGS  Left Ventricle: Left ventricular ejection fraction, by  estimation, is 60 to 65%. The left ventricle has normal function. The left ventricle has no regional wall motion abnormalities. The left ventricular internal cavity size was normal in size. There is  mild concentric left ventricular hypertrophy. Left ventricular diastolic parameters were normal. Right Ventricle: The right ventricular size is normal. No increase in right ventricular wall thickness. Right ventricular systolic function is normal. Left Atrium: Left atrial size was normal in size. Right Atrium: Right atrial size was normal in size. Pericardium: There is no evidence of pericardial effusion. Mitral Valve: The mitral valve is normal in structure. Moderate mitral valve regurgitation. No evidence of mitral valve stenosis. Tricuspid Valve: The tricuspid valve is normal in structure. Tricuspid valve regurgitation is mild . No evidence of tricuspid stenosis. Aortic Valve: The aortic valve is normal in structure. Aortic valve regurgitation is not visualized. Aortic valve sclerosis is present, with no evidence of aortic valve stenosis. Pulmonic Valve: The pulmonic valve was normal in structure. Pulmonic valve regurgitation is not visualized. No evidence of pulmonic stenosis. Aorta: The aortic root is normal in size and structure. Venous: The inferior vena cava is normal in size with greater than 50% respiratory variability, suggesting right atrial pressure of 3 mmHg. IAS/Shunts: No atrial level shunt detected by color flow Doppler.  LEFT VENTRICLE PLAX 2D LVIDd:         3.70 cm   Diastology LVIDs:         2.50 cm   LV e' medial:    7.72 cm/s LV PW:         1.10 cm   LV E/e' medial:  24.9 LV IVS:        1.10 cm   LV e' lateral:   8.27 cm/s LVOT diam:     1.72 cm   LV E/e' lateral: 23.2 LV SV:         57 LV SV Index:   34 LVOT Area:     2.32 cm  RIGHT VENTRICLE             IVC RV Basal diam:  2.96 cm     IVC diam: 1.93 cm RV Mid diam:    2.06 cm RV S prime:     18.00 cm/s TAPSE (M-mode): 1.8 cm LEFT ATRIUM  Index        RIGHT ATRIUM           Index LA diam:        3.73 cm 2.19 cm/m   RA Area:     10.80 cm LA Vol (A2C):   45.4 ml 26.62 ml/m  RA Volume:   18.50 ml  10.85 ml/m LA Vol (A4C):   46.1 ml 27.03 ml/m LA Biplane Vol: 48.3 ml 28.32 ml/m  AORTIC VALVE LVOT Vmax:   149.00 cm/s LVOT Vmean:  99.200 cm/s LVOT VTI:    0.246 m  AORTA Ao Root diam: 2.72 cm Ao Asc diam:  3.54 cm MITRAL VALVE                TRICUSPID VALVE MV Area (PHT): 4.21 cm     TR Peak grad:   37.7 mmHg MV Decel Time: 180 msec     TR Vmax:        307.00 cm/s MV E velocity: 192.00 cm/s MV A velocity: 212.00 cm/s  SHUNTS MV E/A ratio:  0.91         Systemic VTI:  0.25 m                             Systemic Diam: 1.72 cm Kardie Tobb DO Electronically signed by Dub Huntsman DO Signature Date/Time: 12/10/2023/12:16:23 PM    Final    DG Abd Portable 1 View Result Date: 12/10/2023 EXAM: 1 VIEW XRAY OF THE ABDOMEN 12/10/2023 03:30:53 AM COMPARISON: 11/29/2023 CLINICAL HISTORY: NG Tube verification FINDINGS: LINES, TUBES AND DEVICES: Enteric tube in place with side port in the distal esophagus and tip overlying the expected region of the gastric lumen. Loop recorder device overlying the left chest. Midline surgical staples noted. BOWEL: Partially visualized dilated small bowel loops in the upper abdomen. SOFT TISSUES: No opaque urinary calculi. BONES: No acute osseous abnormality. IMPRESSION: 1. Enteric tube in place with side port in the distal esophagus and tip overlying the expected region of the gastric lumen. Recommended advancement approximately 5 cm. Electronically signed by: Norman Gatlin MD 12/10/2023 03:38 AM EST RP Workstation: [id number]   CT Angio Chest PE W and/or Wo Contrast Result Date: 12/10/2023 EXAM: CTA CHEST 12/10/2023 02:11:50 AM TECHNIQUE: CTA of the chest was performed after the administration of 75 mL of iohexol  (OMNIPAQUE ) 350 MG/ML injection. Multiplanar reformatted images are provided for review. MIP images are  provided for review. Automated exposure control, iterative reconstruction, and/or weight based adjustment of the mA/kV was utilized to reduce the radiation dose to as low as reasonably achievable. COMPARISON: CTA chest 11/28/2023. CLINICAL HISTORY: poss filling defect on abdominal CT, rads recommended follow-up im aging. FINDINGS: PULMONARY ARTERIES: Pulmonary arteries are adequately opacified for evaluation. Acute segmental and subsegmental pulmonary emboli in the bilateral lower lobes and left upper lobe pulmonary arteries. The RV measures 3.5 cm and the LV measures 3.9 cm. RV/LV ratio is 0.9. No evidence of right heart strain. Main pulmonary artery is normal in caliber. MEDIASTINUM: The heart and pericardium demonstrate no acute abnormality. There is no acute abnormality of the thoracic aorta. LYMPH NODES: No mediastinal, hilar or axillary lymphadenopathy. LUNGS AND PLEURA: Moderate emphysema. No focal consolidation or pulmonary edema. No evidence of pleural effusion or pneumothorax. UPPER ABDOMEN: Findings in the upper abdomen not substantially changed from same day CT abdomen and pelvis. See that record for details. SOFT TISSUES AND BONES: No acute bone or soft tissue  abnormality. IMPRESSION: 1. Acute segmental and subsegmental pulmonary emboli in the bilateral lower lobes and left upper lobe pulmonary arteries. No evidence of right heart strain (RV/LV ratio 0.9). 2. Moderate emphysema. Given that pulmonary emphysema is an independent risk factor for lung cancer in patients aged 59, consider evaluation for a low-dose CT lung cancer screening program. 3. Critical value/emergent results were called by telephone at the time of interpretation on 12/10/2023 at 2:20 AM to Dr. Jarold, who verbally acknowledged these results. Electronically signed by: Norman Gatlin MD 12/10/2023 02:23 AM EST RP Workstation: [id number]   CT ABDOMEN PELVIS W CONTRAST Result Date: 12/10/2023 EXAM: CT ABDOMEN AND PELVIS WITH  CONTRAST 12/10/2023 12:28:18 AM TECHNIQUE: CT of the abdomen and pelvis was performed with the administration of intravenous contrast. 75 mL of iohexol  (OMNIPAQUE ) 350 MG/ML injection was administered. Multiplanar reformatted images are provided for review. Automated exposure control, iterative reconstruction, and/or weight-based adjustment of the mA/kV was utilized to reduce the radiation dose to as low as reasonably achievable. COMPARISON: CT dated 11/28/2023. CLINICAL HISTORY: Abdominal pain, post-op; recent SBO surgery, increased pain and N/V. FINDINGS: LOWER CHEST: Possible filling defect in a right lower lobe pulmonary artery on series 3 image 1. This may be due to artifact versus pulmonary embolism. Dedicated CTA of the chest is recommended. LIVER: Hypoattenuating focus along the anterior margin of the right hepatic lobe near the falciform ligament may represent focal fat deposition. GALLBLADDER AND BILE DUCTS: Gallbladder is unremarkable. No biliary ductal dilatation. SPLEEN: No acute abnormality. PANCREAS: No acute abnormality. ADRENAL GLANDS: Unchanged heterogeneous left adrenal nodule containing punctate calcifications. KIDNEYS, URETERS AND BLADDER: Cortical renal scarring bilaterally. No stones in the kidneys or ureters. No hydronephrosis. No perinephric or periureteral stranding. Urinary bladder is unremarkable. GI AND BOWEL: Diffuse wall thickening and mucosal hyperenhancement of the colon greatest about the sigmoid colon and rectum. Diffuse dilation of the small bowel with tapering at the small bowel - small bowel anastomosis in the right lower quadrant. Findings may be due to ileus or partial obstruction at the anastomosis. PERITONEUM AND RETROPERITONEUM: Small volume of abdominal pelvic ascites. No free air. VASCULATURE: Aorta is normal in caliber. LYMPH NODES: No lymphadenopathy. REPRODUCTIVE ORGANS: No acute abnormality. BONES AND SOFT TISSUES: No acute osseous abnormality. No focal soft tissue  abnormality. IMPRESSION: 1. Possible filling defect in a right lower lobe pulmonary artery, potentially representing artifact versus pulmonary embolism. Dedicated CTA of the chest is recommended. 2. Diffuse dilation of the small bowel with tapering at the small bowel-small bowel anastomosis in the right lower quadrant, possibly due to ileus or partial obstruction at the anastomosis. 3. Pancolitis greatest about the sigmoid colon and rectum, favored infectious / inflammatory. 4. Small volume of abdominal and pelvic ascites. 5. Large left adrenal heterogeneous mass. As recommended previously, surgical consultation and biochemical evaluation for functioning adrenal region such as pheochromocytoma is recommended. Electronically signed by: Norman Gatlin MD 12/10/2023 12:51 AM EST RP Workstation: [id number]    Anti-infectives: Anti-infectives (From admission, onward)    Start     Dose/Rate Route Frequency Ordered Stop   12/10/23 2200  cefTRIAXone  (ROCEPHIN ) 2 g in sodium chloride  0.9 % 100 mL IVPB        2 g 200 mL/hr over 30 Minutes Intravenous Every 24 hours 12/10/23 0303     12/10/23 1000  metroNIDAZOLE  (FLAGYL ) IVPB 500 mg        500 mg 100 mL/hr over 60 Minutes Intravenous Every 12 hours 12/10/23 0303     12/10/23  0100  cefTRIAXone  (ROCEPHIN ) 2 g in sodium chloride  0.9 % 100 mL IVPB        2 g 200 mL/hr over 30 Minutes Intravenous  Once 12/10/23 0058 12/10/23 0154   12/10/23 0100  metroNIDAZOLE  (FLAGYL ) IVPB 500 mg        500 mg 100 mL/hr over 60 Minutes Intravenous  Once 12/10/23 0058 12/10/23 0302        Assessment/Plan S/p ex lap with SBR for internal hernia with ischemic bowel by Dr. Sebastian on 11/21 now with ileus and pancolitis - patient's history is concerning for c diff colitis given her WBC elevation as well as her extensive diarrhea.  Her first c. Diff test was negative, repeat test is pending  - agree with abx therapy for now.  Currently on Rocephin /Flagyl .  Monitor WBC.  If  this doesn't improve, then may need to transition to broader spectrum, unless her c diff returns positive. - check KUB and will decide whether to continue NGT vs trial clamping  - continue BID WTD to midline  - if repeat c diff is negative and bowel distention improves, then may be able to try anti-diarrheal, but hesitant to do this given appearance on scan with colitis, ileus, etc.  FEN - NPO/NGT/IVFs VTE - heparin  gtt ID - Rocephin /Flagyl   --per [facility]-- B PEs - on heparin  per medicine DM H/o CVA HTN   LOS: 1 day    Burnard JONELLE Louder , [facility] 12/11/2023, 11:17 AM Please see Amion for pager number during day hours 7:00am-4:30pm or 7:00am -11:30am on weekends

## 2023-12-11 NOTE — Progress Notes (Addendum)
 PROGRESS NOTE    [name]  FMW:[id number] DOB: 1965/04/15 DOA: 12/09/2023 PCP: Freddrick, No   Brief Narrative: [name] is a 58 y.o. female with a history of hypertension, diabetes mellitus type 2, DVT, PE, left adrenal mass, small bowel obstruction status post partial bowel resection.  Patient presented secondary to abdominal pain, diarrhea, vomiting with concern for obstruction and found to have evidence of ileus in addition to pancolitis concern for infection.  Patient met sepsis criteria on admission.  General surgery consulted.  She was started empirically on antibiotics and an NG tube was placed.   Assessment and Plan:  Sepsis Present on admission.  Source is GI with evidence of colitis on CT imaging.  Patient started empirically on ceftriaxone  and Flagyl .  GI panel and C. difficile negative.  Blood culture obtained on admission is no growth to date. Leukocytosis is improving. - Follow-up blood culture data  Pancolitis CT imaging on admission significant for evidence of pancolitis greatest about the sigmoid colon and rectum.  Patient started empirically on ceftriaxone  and Flagyl .  GI pathogen panel and C. difficile testing negative. WBC shows good response to current antibiotic regimen. - Continue ceftriaxone  and Flagyl   Ileus Likely related to recent abdominal surgery.  Diagnosed on CT imaging.  General surgery consulted.  NG tube placed on 12/2 for management. - General Surge recommendations: Continue NG tube, abdominal x-ray  Acute recurrent pulmonary embolism Diagnosed on admission via CTA chest which was significant for an acute segmental and subsegmental pulm emboli in the bilateral lower lobes and left upper lobe pulmonary arteries and without evidence of right heart strain.  Hemodynamically stable.  Patient started on heparin  IV for management. - Continue heparin  IV with plans to transition to Eliquis  once patient is closer to discharge and without concern for  surgical management  History of DVT Noted.  Patient is currently on treatment for currently diagnosed pulmonary embolism.  No extremity venous duplex scan ordered and is pending.  Hypokalemia Recurrent. - Continue potassium supplementation - Recheck potassium  Hypocalcemia Corrected calcium  from 12/2 of about 8.4.  Hypomagnesemia Patient given magnesium  supplementation. Magnesium  still low today at 1.6. - Magnesium  supplementation - Recheck magnesium  in AM  Nonsustained ventricular tachycardia Noted. Possibly related to electrolyte imbalances. Patient started on metoprolol. - Manage electrolytes - Continue metoprolol  Diabetes mellitus type 2 Uncontrolled with hyperglycemia based on hemoglobin A1c of 9.7%. patient started on SSI. - Continue SSI  History of CVA Noted.  No residual symptoms.  He does not appear patient is on medication management.  Left adrenal mass Noted on CT imaging.  Recommendation for surgical consultation and medical workup. Paitent has already been referred to endocrinology.  Tobacco abuse Patient declined nicotine  patch.    DVT prophylaxis: Heparin  IV Code Status:   Code Status: Limited: Do not attempt resuscitation (DNR) -DNR-LIMITED -Do Not Intubate/DNI  Family Communication: None at bedside Disposition Plan: Discharge pending ongoing general surgery recommendations   Consultants:  General surgery  Procedures:  None  Antimicrobials: Ceftriaxone  Flagyl    Subjective: Patient reports ongoing generalized abdominal pain. Bowel movement earlier. Continues to pass gas. No nausea or emesis.  Objective: BP 106/72 (BP Location: Right Arm)   Pulse 93   Temp 97.9 F (36.6 C) (Oral)   Resp 17   Wt 57.1 kg   LMP  (LMP Unknown) Comment: a few months  SpO2 95%   BMI 20.32 kg/m   Examination:  General exam: Appears calm and comfortable. Respiratory system: Clear  to auscultation. Respiratory effort normal. Cardiovascular system: S1 &  S2 heard, RRR. Gastrointestinal system: Abdomen is nondistended, soft and nontender. Normal bowel sounds heard. Central nervous system: Alert and oriented. No focal neurological deficits. Musculoskeletal: No edema. No calf tenderness Psychiatry: Judgement and insight appear normal. Mood & affect appropriate.    Data Reviewed: I have personally reviewed following labs and imaging studies  CBC Lab Results  Component Value Date   WBC 15.6 (H) 12/11/2023   RBC 4.14 12/11/2023   HGB 13.0 12/11/2023   HCT 38.7 12/11/2023   MCV 93.5 12/11/2023   MCH 31.4 12/11/2023   PLT 416 (H) 12/11/2023   MCHC 33.6 12/11/2023   RDW 14.2 12/11/2023   LYMPHSABS 2.8 12/10/2023   MONOABS 1.4 (H) 12/10/2023   EOSABS 0.3 12/10/2023   BASOSABS 0.1 12/10/2023     Last metabolic panel Lab Results  Component Value Date   NA 140 12/10/2023   K 2.9 (L) 12/10/2023   CL 108 12/10/2023   CO2 24 12/10/2023   BUN 7 12/10/2023   CREATININE 0.68 12/10/2023   GLUCOSE 96 12/10/2023   GFRNONAA >60 12/10/2023   GFRAA >60 02/28/2019   CALCIUM  7.5 (L) 12/10/2023   PHOS 2.3 (L) 12/03/2023   PROT 4.5 (L) 12/10/2023   ALBUMIN  2.0 (L) 12/10/2023   LABGLOB 3.0 05/01/2021   AGRATIO 1.5 05/01/2021   BILITOT 0.4 12/10/2023   ALKPHOS 57 12/10/2023   AST 11 (L) 12/10/2023   ALT 11 12/10/2023   ANIONGAP 8 12/10/2023    GFR: Estimated Creatinine Clearance: 68.3 mL/min (by C-G formula based on SCr of 0.68 mg/dL).  Recent Results (from the past 240 hours)  Gastrointestinal Panel by PCR , Stool     Status: None   Collection Time: 12/10/23 12:57 AM   Specimen: Stool  Result Value Ref Range Status   Campylobacter species NOT DETECTED NOT DETECTED Final   Plesimonas shigelloides NOT DETECTED NOT DETECTED Final   Salmonella species NOT DETECTED NOT DETECTED Final   Yersinia enterocolitica NOT DETECTED NOT DETECTED Final   Vibrio species NOT DETECTED NOT DETECTED Final   Vibrio cholerae NOT DETECTED NOT DETECTED  Final   Enteroaggregative E coli (EAEC) NOT DETECTED NOT DETECTED Final   Enteropathogenic E coli (EPEC) NOT DETECTED NOT DETECTED Final   Enterotoxigenic E coli (ETEC) NOT DETECTED NOT DETECTED Final   Shiga like toxin producing E coli (STEC) NOT DETECTED NOT DETECTED Final   Shigella/Enteroinvasive E coli (EIEC) NOT DETECTED NOT DETECTED Final   Cryptosporidium NOT DETECTED NOT DETECTED Final   Cyclospora cayetanensis NOT DETECTED NOT DETECTED Final   Entamoeba histolytica NOT DETECTED NOT DETECTED Final   Giardia lamblia NOT DETECTED NOT DETECTED Final   Adenovirus F40/41 NOT DETECTED NOT DETECTED Final   Astrovirus NOT DETECTED NOT DETECTED Final   Norovirus GI/GII NOT DETECTED NOT DETECTED Final   Rotavirus A NOT DETECTED NOT DETECTED Final   Sapovirus (I, II, IV, and V) NOT DETECTED NOT DETECTED Final    Comment: Performed at [facility], [address] [place], [place] [address]  C Difficile Quick Screen w PCR reflex     Status: None   Collection Time: 12/10/23 12:57 AM   Specimen: Stool  Result Value Ref Range Status   C Diff antigen NEGATIVE NEGATIVE Final   C Diff toxin NEGATIVE NEGATIVE Final   C Diff interpretation No C. difficile detected.  Final    Comment: Performed at [facility], [address],  [place], [place] [address]  Culture, blood (single) w Reflex to ID Panel     Status: None (Preliminary result)   Collection Time: 12/10/23  9:05 AM   Specimen: BLOOD RIGHT HAND  Result Value Ref Range Status   Specimen Description BLOOD RIGHT HAND  Final   Special Requests   Final    BOTTLES DRAWN AEROBIC ONLY Blood Culture adequate volume   Culture   Final    NO GROWTH < 24 HOURS Performed at [facility], [address], [place], [place] [address]    Report Status PENDING  Incomplete      Radiology Studies: ECHOCARDIOGRAM COMPLETE Result Date: 12/10/2023    ECHOCARDIOGRAM REPORT   Patient Name:   LETISIA SCHWALB Date of Exam: 12/10/2023  Medical Rec #:  [id number]         Height:       66.0 in Accession #:    [id number]        Weight:       137.6 lb Date of Birth:  1965/02/08         BSA:          1.706 m Patient Age:    58 years          BP:           96/62 mmHg Patient Gender: F                 HR:           103 bpm. Exam Location:  Inpatient Procedure: 2D Echo, Cardiac Doppler and Color Doppler (Both Spectral and Color            Flow Doppler were utilized during procedure). Indications:    Pulmonary Embolus  History:        Patient has prior history of Echocardiogram examinations, most                 recent 03/24/2021. Risk Factors:Diabetes and Hypertension. CKD.  Sonographer:    Philomena Daring Referring Phys: [id number] JUSTIN B HOWERTER IMPRESSIONS  1. Left ventricular ejection fraction, by estimation, is 60 to 65%. The left ventricle has normal function. The left ventricle has no regional wall motion abnormalities. There is mild concentric left ventricular hypertrophy. Left ventricular diastolic parameters were normal.  2. Right ventricular systolic function is normal. The right ventricular size is normal.  3. The mitral valve is normal in structure. Moderate mitral valve regurgitation. No evidence of mitral stenosis.  4. The aortic valve is normal in structure. Aortic valve regurgitation is not visualized. Aortic valve sclerosis is present, with no evidence of aortic valve stenosis.  5. The inferior vena cava is normal in size with greater than 50% respiratory variability, suggesting right atrial pressure of 3 mmHg. FINDINGS  Left Ventricle: Left ventricular ejection fraction, by estimation, is 60 to 65%. The left ventricle has normal function. The left ventricle has no regional wall motion abnormalities. The left ventricular internal cavity size was normal in size. There is  mild concentric left ventricular hypertrophy. Left ventricular diastolic parameters were normal. Right Ventricle: The right ventricular size is normal. No increase in right  ventricular wall thickness. Right ventricular systolic function is normal. Left Atrium: Left atrial size was normal in size. Right Atrium: Right atrial size was normal in size. Pericardium: There is no evidence of pericardial effusion. Mitral Valve: The mitral valve is normal in structure. Moderate mitral valve regurgitation. No evidence of mitral valve stenosis. Tricuspid Valve:  The tricuspid valve is normal in structure. Tricuspid valve regurgitation is mild . No evidence of tricuspid stenosis. Aortic Valve: The aortic valve is normal in structure. Aortic valve regurgitation is not visualized. Aortic valve sclerosis is present, with no evidence of aortic valve stenosis. Pulmonic Valve: The pulmonic valve was normal in structure. Pulmonic valve regurgitation is not visualized. No evidence of pulmonic stenosis. Aorta: The aortic root is normal in size and structure. Venous: The inferior vena cava is normal in size with greater than 50% respiratory variability, suggesting right atrial pressure of 3 mmHg. IAS/Shunts: No atrial level shunt detected by color flow Doppler.  LEFT VENTRICLE PLAX 2D LVIDd:         3.70 cm   Diastology LVIDs:         2.50 cm   LV e' medial:    7.72 cm/s LV PW:         1.10 cm   LV E/e' medial:  24.9 LV IVS:        1.10 cm   LV e' lateral:   8.27 cm/s LVOT diam:     1.72 cm   LV E/e' lateral: 23.2 LV SV:         57 LV SV Index:   34 LVOT Area:     2.32 cm  RIGHT VENTRICLE             IVC RV Basal diam:  2.96 cm     IVC diam: 1.93 cm RV Mid diam:    2.06 cm RV S prime:     18.00 cm/s TAPSE (M-mode): 1.8 cm LEFT ATRIUM             Index        RIGHT ATRIUM           Index LA diam:        3.73 cm 2.19 cm/m   RA Area:     10.80 cm LA Vol (A2C):   45.4 ml 26.62 ml/m  RA Volume:   18.50 ml  10.85 ml/m LA Vol (A4C):   46.1 ml 27.03 ml/m LA Biplane Vol: 48.3 ml 28.32 ml/m  AORTIC VALVE LVOT Vmax:   149.00 cm/s LVOT Vmean:  99.200 cm/s LVOT VTI:    0.246 m  AORTA Ao Root diam: 2.72 cm Ao Asc  diam:  3.54 cm MITRAL VALVE                TRICUSPID VALVE MV Area (PHT): 4.21 cm     TR Peak grad:   37.7 mmHg MV Decel Time: 180 msec     TR Vmax:        307.00 cm/s MV E velocity: 192.00 cm/s MV A velocity: 212.00 cm/s  SHUNTS MV E/A ratio:  0.91         Systemic VTI:  0.25 m                             Systemic Diam: 1.72 cm Kardie Tobb DO Electronically signed by Dub Huntsman DO Signature Date/Time: 12/10/2023/12:16:23 PM    Final    DG Abd Portable 1 View Result Date: 12/10/2023 EXAM: 1 VIEW XRAY OF THE ABDOMEN 12/10/2023 03:30:53 AM COMPARISON: 11/29/2023 CLINICAL HISTORY: NG Tube verification FINDINGS: LINES, TUBES AND DEVICES: Enteric tube in place with side port in the distal esophagus and tip overlying the expected region of the gastric lumen. Loop recorder device overlying the left chest. Midline  surgical staples noted. BOWEL: Partially visualized dilated small bowel loops in the upper abdomen. SOFT TISSUES: No opaque urinary calculi. BONES: No acute osseous abnormality. IMPRESSION: 1. Enteric tube in place with side port in the distal esophagus and tip overlying the expected region of the gastric lumen. Recommended advancement approximately 5 cm. Electronically signed by: Norman Gatlin MD 12/10/2023 03:38 AM EST RP Workstation: [id number]   CT Angio Chest PE W and/or Wo Contrast Result Date: 12/10/2023 EXAM: CTA CHEST 12/10/2023 02:11:50 AM TECHNIQUE: CTA of the chest was performed after the administration of 75 mL of iohexol  (OMNIPAQUE ) 350 MG/ML injection. Multiplanar reformatted images are provided for review. MIP images are provided for review. Automated exposure control, iterative reconstruction, and/or weight based adjustment of the mA/kV was utilized to reduce the radiation dose to as low as reasonably achievable. COMPARISON: CTA chest 11/28/2023. CLINICAL HISTORY: poss filling defect on abdominal CT, rads recommended follow-up im aging. FINDINGS: PULMONARY ARTERIES: Pulmonary arteries are  adequately opacified for evaluation. Acute segmental and subsegmental pulmonary emboli in the bilateral lower lobes and left upper lobe pulmonary arteries. The RV measures 3.5 cm and the LV measures 3.9 cm. RV/LV ratio is 0.9. No evidence of right heart strain. Main pulmonary artery is normal in caliber. MEDIASTINUM: The heart and pericardium demonstrate no acute abnormality. There is no acute abnormality of the thoracic aorta. LYMPH NODES: No mediastinal, hilar or axillary lymphadenopathy. LUNGS AND PLEURA: Moderate emphysema. No focal consolidation or pulmonary edema. No evidence of pleural effusion or pneumothorax. UPPER ABDOMEN: Findings in the upper abdomen not substantially changed from same day CT abdomen and pelvis. See that record for details. SOFT TISSUES AND BONES: No acute bone or soft tissue abnormality. IMPRESSION: 1. Acute segmental and subsegmental pulmonary emboli in the bilateral lower lobes and left upper lobe pulmonary arteries. No evidence of right heart strain (RV/LV ratio 0.9). 2. Moderate emphysema. Given that pulmonary emphysema is an independent risk factor for lung cancer in patients aged 59, consider evaluation for a low-dose CT lung cancer screening program. 3. Critical value/emergent results were called by telephone at the time of interpretation on 12/10/2023 at 2:20 AM to Dr. Jarold, who verbally acknowledged these results. Electronically signed by: Norman Gatlin MD 12/10/2023 02:23 AM EST RP Workstation: [id number]   CT ABDOMEN PELVIS W CONTRAST Result Date: 12/10/2023 EXAM: CT ABDOMEN AND PELVIS WITH CONTRAST 12/10/2023 12:28:18 AM TECHNIQUE: CT of the abdomen and pelvis was performed with the administration of intravenous contrast. 75 mL of iohexol  (OMNIPAQUE ) 350 MG/ML injection was administered. Multiplanar reformatted images are provided for review. Automated exposure control, iterative reconstruction, and/or weight-based adjustment of the mA/kV was utilized to reduce the  radiation dose to as low as reasonably achievable. COMPARISON: CT dated 11/28/2023. CLINICAL HISTORY: Abdominal pain, post-op; recent SBO surgery, increased pain and N/V. FINDINGS: LOWER CHEST: Possible filling defect in a right lower lobe pulmonary artery on series 3 image 1. This may be due to artifact versus pulmonary embolism. Dedicated CTA of the chest is recommended. LIVER: Hypoattenuating focus along the anterior margin of the right hepatic lobe near the falciform ligament may represent focal fat deposition. GALLBLADDER AND BILE DUCTS: Gallbladder is unremarkable. No biliary ductal dilatation. SPLEEN: No acute abnormality. PANCREAS: No acute abnormality. ADRENAL GLANDS: Unchanged heterogeneous left adrenal nodule containing punctate calcifications. KIDNEYS, URETERS AND BLADDER: Cortical renal scarring bilaterally. No stones in the kidneys or ureters. No hydronephrosis. No perinephric or periureteral stranding. Urinary bladder is unremarkable. GI AND BOWEL: Diffuse wall thickening and mucosal hyperenhancement  of the colon greatest about the sigmoid colon and rectum. Diffuse dilation of the small bowel with tapering at the small bowel - small bowel anastomosis in the right lower quadrant. Findings may be due to ileus or partial obstruction at the anastomosis. PERITONEUM AND RETROPERITONEUM: Small volume of abdominal pelvic ascites. No free air. VASCULATURE: Aorta is normal in caliber. LYMPH NODES: No lymphadenopathy. REPRODUCTIVE ORGANS: No acute abnormality. BONES AND SOFT TISSUES: No acute osseous abnormality. No focal soft tissue abnormality. IMPRESSION: 1. Possible filling defect in a right lower lobe pulmonary artery, potentially representing artifact versus pulmonary embolism. Dedicated CTA of the chest is recommended. 2. Diffuse dilation of the small bowel with tapering at the small bowel-small bowel anastomosis in the right lower quadrant, possibly due to ileus or partial obstruction at the anastomosis.  3. Pancolitis greatest about the sigmoid colon and rectum, favored infectious / inflammatory. 4. Small volume of abdominal and pelvic ascites. 5. Large left adrenal heterogeneous mass. As recommended previously, surgical consultation and biochemical evaluation for functioning adrenal region such as pheochromocytoma is recommended. Electronically signed by: Norman Gatlin MD 12/10/2023 12:51 AM EST RP Workstation: [id number]      LOS: 1 day    Elgin Lam, MD [facility] 12/11/2023, 9:37 AM   If 7PM-7AM, please contact night-coverage [email or web address]

## 2023-12-11 NOTE — Progress Notes (Signed)
 PHARMACY - ANTICOAGULATION CONSULT NOTE  Pharmacy Consult for Heparin   Indication: pulmonary embolus  Allergies  Allergen Reactions   Amoxicillin Hives   Aspirin Hives   Codeine Hives   Penicillins Hives    Vital Signs: Temp: 97.9 F (36.6 C) (12/03 0326) Temp Source: Oral (12/03 0326) BP: 106/72 (12/03 0326) Pulse Rate: 93 (12/03 0326)  Labs: Recent Labs    12/09/23 2243 12/10/23 0338 12/10/23 0913 12/10/23 1102 12/10/23 1912 12/11/23 0331  HGB 12.6 12.9  --   --   --  13.0  HCT 39.3 38.5  --   --   --  38.7  PLT 368 384  --   --   --  416*  HEPARINUNFRC  --   --   --  <0.10* 0.10* 0.35  CREATININE 0.71 0.75 0.72  --  0.68  --     Estimated Creatinine Clearance: 68.3 mL/min (by C-G formula based on SCr of 0.68 mg/dL).   Medical History: Past Medical History:  Diagnosis Date   Acid reflux    Adrenal benign tumor    Apparently enlarging and patient is supposed to have it taken out   Anxiety    Depression    Diabetes mellitus without complication (HCC)    DVT (deep vein thrombosis) in pregnancy    Hypertension    Nausea vomiting and diarrhea 03/24/2021   PE (pulmonary embolism)    Stroke [facility])      Assessment: 58 y/o F 10 days post-op from small bowel resection presents to the ED with increased N/V and surgical site drainage. CT abdomen revealed a possible RLL pulmonary artery filling defect. A CT Angio study was then completed which showed bilateral PE. Start heparin . Labs above reviewed. PTA meds reviewed.   Heparin  level now therapeutic at 0.35  Goal of Therapy:  Heparin  level 0.3-0.7 units/ml Monitor platelets by anticoagulation protocol: Yes   Plan:  Increase heparin  to 1400 units / hr  Daily CBC/Heparin  level Monitor for bleeding  Thank you. Olam Monte, PharmD 12/11/2023 8:53 AM  Please check AMION for all [facility] Pharmacy phone numbers After 10:00 PM, call Main Pharmacy [phone]

## 2023-12-11 NOTE — Plan of Care (Signed)

## 2023-12-11 NOTE — Hospital Course (Addendum)
 [name] is a 58 y.o. female with a history of hypertension, diabetes mellitus type 2, DVT, PE, left adrenal mass, small bowel obstruction status post partial bowel resection.  Patient presented secondary to abdominal pain, diarrhea, vomiting with concern for obstruction and found to have evidence of ileus in addition to pancolitis concern for infection.  Patient met sepsis criteria on admission.  General surgery consulted.  She was started empirically on antibiotics and an NG tube was placed for management. NG tube eventually successfully removed and patient's diet was advanced. Patient was also found to have an acute PE and DVT in addition to an episode of significant respiratory distress and respiratory failure. Patient started on Heparin  IV for her DVT/PE in addition to Lasix  for respiratory failure possibly related to fluid overload. Discharge on Eliquis .

## 2023-12-12 ENCOUNTER — Inpatient Hospital Stay (HOSPITAL_COMMUNITY)

## 2023-12-12 DIAGNOSIS — K56600 Partial intestinal obstruction, unspecified as to cause: Secondary | ICD-10-CM | POA: Diagnosis not present

## 2023-12-12 DIAGNOSIS — J9601 Acute respiratory failure with hypoxia: Secondary | ICD-10-CM | POA: Diagnosis not present

## 2023-12-12 LAB — CBC
HCT: 38.6 % (ref 36.0–46.0)
Hemoglobin: 12.9 g/dL (ref 12.0–15.0)
MCH: 31.5 pg (ref 26.0–34.0)
MCHC: 33.4 g/dL (ref 30.0–36.0)
MCV: 94.1 fL (ref 80.0–100.0)
Platelets: 405 K/uL — ABNORMAL HIGH (ref 150–400)
RBC: 4.1 MIL/uL (ref 3.87–5.11)
RDW: 14.1 % (ref 11.5–15.5)
WBC: 21.2 K/uL — ABNORMAL HIGH (ref 4.0–10.5)
nRBC: 0 % (ref 0.0–0.2)

## 2023-12-12 LAB — HEPARIN LEVEL (UNFRACTIONATED): Heparin Unfractionated: 0.32 [IU]/mL (ref 0.30–0.70)

## 2023-12-12 LAB — GLUCOSE, CAPILLARY
Glucose-Capillary: 118 mg/dL — ABNORMAL HIGH (ref 70–99)
Glucose-Capillary: 138 mg/dL — ABNORMAL HIGH (ref 70–99)
Glucose-Capillary: 158 mg/dL — ABNORMAL HIGH (ref 70–99)
Glucose-Capillary: 173 mg/dL — ABNORMAL HIGH (ref 70–99)

## 2023-12-12 LAB — BASIC METABOLIC PANEL WITH GFR
Anion gap: 22 — ABNORMAL HIGH (ref 5–15)
BUN: <5 mg/dL — ABNORMAL LOW (ref 6–20)
CO2: 12 mmol/L — ABNORMAL LOW (ref 22–32)
Calcium: 7.8 mg/dL — ABNORMAL LOW (ref 8.9–10.3)
Chloride: 108 mmol/L (ref 98–111)
Creatinine, Ser: 0.71 mg/dL (ref 0.44–1.00)
GFR, Estimated: >60 mL/min (ref >60)
Glucose, Bld: 84 mg/dL (ref 70–99)
Potassium: 3.8 mmol/L (ref 3.5–5.1)
Sodium: 142 mmol/L (ref 135–145)

## 2023-12-12 LAB — MAGNESIUM: Magnesium: 1.9 mg/dL (ref 1.7–2.4)

## 2023-12-12 LAB — BRAIN NATRIURETIC PEPTIDE: B Natriuretic Peptide: 721.9 pg/mL — ABNORMAL HIGH (ref 0.0–100.0)

## 2023-12-12 MED ORDER — ZINC OXIDE 40 % EX OINT
TOPICAL_OINTMENT | CUTANEOUS | Status: AC
  Filled 2023-12-12: qty 57

## 2023-12-12 MED ORDER — FUROSEMIDE 10 MG/ML IJ SOLN
20.0000 mg | Freq: Once | INTRAMUSCULAR | Status: AC
  Administered 2023-12-12: 20 mg via INTRAVENOUS
  Filled 2023-12-12: qty 2

## 2023-12-12 MED ORDER — SODIUM BICARBONATE 8.4 % IV SOLN
INTRAVENOUS | Status: DC
  Filled 2023-12-12: qty 150
  Filled 2023-12-12 (×2): qty 1000

## 2023-12-12 MED ORDER — DEXTROSE 50 % IV SOLN: 25.0000 mL | Freq: Once | INTRAVENOUS | Status: DC

## 2023-12-12 MED ORDER — DEXTROSE-SODIUM CHLORIDE 5-0.9 % IV SOLN: INTRAVENOUS | Status: DC

## 2023-12-12 MED ORDER — FUROSEMIDE 10 MG/ML IJ SOLN: 20.0000 mg | Freq: Once | INTRAMUSCULAR | Status: DC

## 2023-12-12 MED ORDER — POTASSIUM CHLORIDE 10 MEQ/100ML IV SOLN
10.0000 meq | INTRAVENOUS | Status: AC
  Administered 2023-12-12 (×2): 10 meq via INTRAVENOUS
  Filled 2023-12-12 (×2): qty 100

## 2023-12-12 MED ORDER — POTASSIUM CHLORIDE 10 MEQ/100ML IV SOLN: 10.0000 meq | INTRAVENOUS | Status: DC

## 2023-12-12 NOTE — Progress Notes (Signed)
    12/12/23 0007  Provider Notification  Provider Name/Title Dr. Franky  Date Provider Notified 12/12/23  Time Provider Notified 0007  Method of Notification Page  Notification Reason CBG is trending lower, now is 72mg /dl. This Pt has been NPO since admission. She is on 0.9% NSS 100 ml/hr.  We concern about hypoglycemic may occur.  Provider response See new orders;Evaluate remotely   D50% half ampule. Recheck and if still low will start D5. Patient has a history of poorly controlled diabetes.  We could follow the standing order for hypoglycemic. D5% NSS that will be safest.  Date of Provider Response 12/12/23  Time of Provider Response 0008   Wendi Dash, RN

## 2023-12-12 NOTE — Progress Notes (Signed)
 Subjective: Patient continues to have generalized abdominal pain. NGT output a little thinner and KUB yesterday reassuring. 3 loose BM total yesterday.   Objective: Vital signs in last 24 hours: Temp:  [97.5 F (36.4 C)-99.4 F (37.4 C)] 99.4 F (37.4 C) (12/04 0800) Pulse Rate:  [93-100] 96 (12/04 0800) Resp:  [13-34] 18 (12/04 0800) BP: (107-122)/(65-82) 109/65 (12/04 0800) SpO2:  [95 %-100 %] 100 % (12/04 0800) Weight:  [60.3 kg-63.3 kg] 63.3 kg (12/04 0625) Last BM Date : 12/11/23  Intake/Output from previous day: 12/03 0701 - 12/04 0700 In: 1462.9 [P.O.:100; I.V.:744.6; IV Piggyback:618.2] Out: 2050 [Urine:1000; Emesis/NG output:1050] Intake/Output this shift: No intake/output data recorded.  PE: Gen: chronically unwell appearing female, NAD currently lying in bed Heart: RRR Lungs: normal effort Abd: soft, distended, NGT in place with thin bilious, midline wound base is pale with some fibrinous exudate, but no overt infection. Fascia is intact.  Some diffuse tenderness, but no guarding/peritonitis.  Lab Results:  Recent Labs    12/11/23 0331 12/12/23 0718  WBC 15.6* 21.2*  HGB 13.0 12.9  HCT 38.7 38.6  PLT 416* 405*   BMET Recent Labs    12/11/23 0934 12/11/23 1534 12/12/23 0337  NA 140  --  142  K 2.7* 2.9* 3.8  CL 106  --  108  CO2 20*  --  12*  GLUCOSE 84  --  84  BUN <5*  --  <5*  CREATININE 0.65  --  0.71  CALCIUM  7.2*  --  7.8*   PT/INR No results for input(s): LABPROT, INR in the last 72 hours. CMP     Component Value Date/Time   NA 142 12/12/2023 0337   NA 134 05/01/2021 1532   K 3.8 12/12/2023 0337   CL 108 12/12/2023 0337   CO2 12 (L) 12/12/2023 0337   GLUCOSE 84 12/12/2023 0337   BUN <5 (L) 12/12/2023 0337   BUN 8 05/01/2021 1532   CREATININE 0.71 12/12/2023 0337   CALCIUM  7.8 (L) 12/12/2023 0337   PROT 4.5 (L) 12/10/2023 0913   PROT 7.4 05/01/2021 1532   ALBUMIN  2.0 (L) 12/10/2023 0913   ALBUMIN  4.4 05/01/2021 1532    AST 11 (L) 12/10/2023 0913   ALT 11 12/10/2023 0913   ALKPHOS 57 12/10/2023 0913   BILITOT 0.4 12/10/2023 0913   BILITOT 0.3 05/01/2021 1532   GFRNONAA >60 12/12/2023 0337   GFRAA >60 02/28/2019 2041   Lipase     Component Value Date/Time   LIPASE 21 12/09/2023 2243       Studies/Results: DG Chest Port 1 View Result Date: 12/12/2023 EXAM: 1 VIEW(S) XRAY OF THE CHEST 12/12/2023 04:39:32 AM COMPARISON: 10/17/2023. CTA chest 12/10/2023. CLINICAL HISTORY: 58 year old female. Acute respiratory distress. Recently diagnosed acute pulmonary emboli. FINDINGS: LINES, TUBES AND DEVICES: Satisfactory enteric tube placement into the stomach with the side hole at the level of the gastric body. LUNGS AND PLEURA: New right perihilar opacity. Diffuse interstitial opacities. Blunting of costophrenic angles. Chronic appearing hyperinflation. Acute on chronic diffuse increased pulmonary interstitium. Trace new fluid in the right minor fissure. Colon emphysema demonstrated on recent CTA. No pneumothorax. 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 BONES AND SOFT TISSUES: No acute osseous abnormality. UPPER ABDOMEN: Negative visible bowel gas pattern. IMPRESSION: 1. Emphysema, hyperinflation with interval diffuse increased pulmonary interstitium and small volume pleural fluid. Favor acute pulmonary edema over viral/atypical respiratory infection. 2. Satisfactory enteric tube position in  the stomach. Electronically signed by: Helayne Hurst MD 12/12/2023 05:01 AM EST RP Workstation: [id number]   DG Abd Portable 1V Result Date: 12/11/2023 EXAM: 1 VIEW XRAY OF THE ABDOMEN 12/11/2023 11:54:00 AM COMPARISON: 01/02/2024 CLINICAL HISTORY: 01250 Ileus (HCC) 98749 Ileus (HCC) 01250 FINDINGS: LINES, TUBES AND DEVICES: Enteric tube is in the stomach, advanced from prior study. BOWEL: Nonobstructive bowel gas pattern. Single mildly prominent right lower quadrant small bowel loop. SOFT  TISSUES: No opaque urinary calculi. BONES: No acute osseous abnormality. IMPRESSION: 1. No bowel obstruction. 2. Enteric tube tip projects over the stomach, appropriately advanced from the prior study. Electronically signed by: Franky Crease MD 12/11/2023 04:43 PM EST RP Workstation: [id number]   VAS US  LOWER EXTREMITY VENOUS (DVT) Result Date: 12/11/2023  Lower Venous DVT Study Patient Name:  KENNITA PAVLOVICH  Date of Exam:   12/11/2023 Medical Rec #: [id number]          Accession #:    [id number] Date of Birth: May 10, 1965          Patient Gender: F Patient Age:   19 years Exam Location:  [facility] Procedure:      VAS US  LOWER EXTREMITY VENOUS (DVT) Referring Phys: MAXIMINO SHARPS --------------------------------------------------------------------------------  Indications: Swelling, and pulmonary embolism. Other Indications: History of DVT/PE on chronic anticoagulation. Risk Factors: Stroke, left adrenal mass, and recent small bowel obstruction with gangrenous bowel requiring exploratory laparotomy with partial bowel resection. Comparison Study: 10/17/23 - negative Performing Technologist: Ricka Sturdivant-Jones RDMS, RVT  Examination Guidelines: A complete evaluation includes B-mode imaging, spectral Doppler, color Doppler, and power Doppler as needed of all accessible portions of each vessel. Bilateral testing is considered an integral part of a complete examination. Limited examinations for reoccurring indications may be performed as noted. The reflux portion of the exam is performed with the patient in reverse Trendelenburg.  +---------+---------------+---------+-----------+----------+--------------+ RIGHT    CompressibilityPhasicitySpontaneityPropertiesThrombus Aging +---------+---------------+---------+-----------+----------+--------------+ CFV      Partial        Yes      Yes                  Chronic        +---------+---------------+---------+-----------+----------+--------------+ SFJ       Full                                                        +---------+---------------+---------+-----------+----------+--------------+ FV Prox  None                                                        +---------+---------------+---------+-----------+----------+--------------+ FV Mid   None           No       No                                  +---------+---------------+---------+-----------+----------+--------------+ FV DistalNone                                                        +---------+---------------+---------+-----------+----------+--------------+  PFV      Full                                                        +---------+---------------+---------+-----------+----------+--------------+ POP      None           No       No                                  +---------+---------------+---------+-----------+----------+--------------+ PTV      Full                                                        +---------+---------------+---------+-----------+----------+--------------+ PERO     Full                                                        +---------+---------------+---------+-----------+----------+--------------+ EIV                     Yes      Yes                  Patent         +---------+---------------+---------+-----------+----------+--------------+ CIV                     Yes      Yes                  Patent         +---------+---------------+---------+-----------+----------+--------------+   +---------+---------------+---------+-----------+----------+--------------+ LEFT     CompressibilityPhasicitySpontaneityPropertiesThrombus Aging +---------+---------------+---------+-----------+----------+--------------+ CFV      Full           Yes      Yes                                 +---------+---------------+---------+-----------+----------+--------------+ SFJ      Full                                                         +---------+---------------+---------+-----------+----------+--------------+ FV Prox  Full                                                        +---------+---------------+---------+-----------+----------+--------------+ FV Mid   Full           Yes      Yes                                 +---------+---------------+---------+-----------+----------+--------------+  FV DistalFull                                                        +---------+---------------+---------+-----------+----------+--------------+ PFV      Full                                                        +---------+---------------+---------+-----------+----------+--------------+ POP      Full           Yes      Yes                                 +---------+---------------+---------+-----------+----------+--------------+ PTV      Full                                                        +---------+---------------+---------+-----------+----------+--------------+ PERO     Full                                                        +---------+---------------+---------+-----------+----------+--------------+ EIV                     Yes      Yes                  Patent         +---------+---------------+---------+-----------+----------+--------------+     Summary: RIGHT: - Findings consistent with acute deep vein thrombosis involving the right common femoral vein, right femoral vein, and right popliteal vein.  - No cystic structure found in the popliteal fossa.  LEFT: - There is no evidence of deep vein thrombosis in the lower extremity.  - No cystic structure found in the popliteal fossa.  *See table(s) above for measurements and observations. Electronically signed by Debby Robertson on 12/11/2023 at 4:08:35 PM.    Final    ECHOCARDIOGRAM COMPLETE Result Date: 12/10/2023    ECHOCARDIOGRAM REPORT   Patient Name:   [name] Date of Exam: 12/10/2023 Medical Rec #:   [id number]         Height:       66.0 in Accession #:    [id number]        Weight:       137.6 lb Date of Birth:  1965-11-20         BSA:          1.706 m Patient Age:    58 years          BP:           96/62 mmHg Patient Gender: F                 HR:           103 bpm. Exam  Location:  Inpatient Procedure: 2D Echo, Cardiac Doppler and Color Doppler (Both Spectral and Color            Flow Doppler were utilized during procedure). Indications:    Pulmonary Embolus  History:        Patient has prior history of Echocardiogram examinations, most                 recent 03/24/2021. Risk Factors:Diabetes and Hypertension. CKD.  Sonographer:    Philomena Daring Referring Phys: [id number] JUSTIN B HOWERTER IMPRESSIONS  1. Left ventricular ejection fraction, by estimation, is 60 to 65%. The left ventricle has normal function. The left ventricle has no regional wall motion abnormalities. There is mild concentric left ventricular hypertrophy. Left ventricular diastolic parameters were normal.  2. Right ventricular systolic function is normal. The right ventricular size is normal.  3. The mitral valve is normal in structure. Moderate mitral valve regurgitation. No evidence of mitral stenosis.  4. The aortic valve is normal in structure. Aortic valve regurgitation is not visualized. Aortic valve sclerosis is present, with no evidence of aortic valve stenosis.  5. The inferior vena cava is normal in size with greater than 50% respiratory variability, suggesting right atrial pressure of 3 mmHg. FINDINGS  Left Ventricle: Left ventricular ejection fraction, by estimation, is 60 to 65%. The left ventricle has normal function. The left ventricle has no regional wall motion abnormalities. The left ventricular internal cavity size was normal in size. There is  mild concentric left ventricular hypertrophy. Left ventricular diastolic parameters were normal. Right Ventricle: The right ventricular size is normal. No increase in right ventricular wall  thickness. Right ventricular systolic function is normal. Left Atrium: Left atrial size was normal in size. Right Atrium: Right atrial size was normal in size. Pericardium: There is no evidence of pericardial effusion. Mitral Valve: The mitral valve is normal in structure. Moderate mitral valve regurgitation. No evidence of mitral valve stenosis. Tricuspid Valve: The tricuspid valve is normal in structure. Tricuspid valve regurgitation is mild . No evidence of tricuspid stenosis. Aortic Valve: The aortic valve is normal in structure. Aortic valve regurgitation is not visualized. Aortic valve sclerosis is present, with no evidence of aortic valve stenosis. Pulmonic Valve: The pulmonic valve was normal in structure. Pulmonic valve regurgitation is not visualized. No evidence of pulmonic stenosis. Aorta: The aortic root is normal in size and structure. Venous: The inferior vena cava is normal in size with greater than 50% respiratory variability, suggesting right atrial pressure of 3 mmHg. IAS/Shunts: No atrial level shunt detected by color flow Doppler.  LEFT VENTRICLE PLAX 2D LVIDd:         3.70 cm   Diastology LVIDs:         2.50 cm   LV e' medial:    7.72 cm/s LV PW:         1.10 cm   LV E/e' medial:  24.9 LV IVS:        1.10 cm   LV e' lateral:   8.27 cm/s LVOT diam:     1.72 cm   LV E/e' lateral: 23.2 LV SV:         57 LV SV Index:   34 LVOT Area:     2.32 cm  RIGHT VENTRICLE             IVC RV Basal diam:  2.96 cm     IVC diam: 1.93 cm RV Mid diam:    2.06 cm RV  S prime:     18.00 cm/s TAPSE (M-mode): 1.8 cm LEFT ATRIUM             Index        RIGHT ATRIUM           Index LA diam:        3.73 cm 2.19 cm/m   RA Area:     10.80 cm LA Vol (A2C):   45.4 ml 26.62 ml/m  RA Volume:   18.50 ml  10.85 ml/m LA Vol (A4C):   46.1 ml 27.03 ml/m LA Biplane Vol: 48.3 ml 28.32 ml/m  AORTIC VALVE LVOT Vmax:   149.00 cm/s LVOT Vmean:  99.200 cm/s LVOT VTI:    0.246 m  AORTA Ao Root diam: 2.72 cm Ao Asc diam:  3.54 cm  MITRAL VALVE                TRICUSPID VALVE MV Area (PHT): 4.21 cm     TR Peak grad:   37.7 mmHg MV Decel Time: 180 msec     TR Vmax:        307.00 cm/s MV E velocity: 192.00 cm/s MV A velocity: 212.00 cm/s  SHUNTS MV E/A ratio:  0.91         Systemic VTI:  0.25 m                             Systemic Diam: 1.72 cm Kardie Tobb DO Electronically signed by Dub Huntsman DO Signature Date/Time: 12/10/2023/12:16:23 PM    Final     Anti-infectives: Anti-infectives (From admission, onward)    Start     Dose/Rate Route Frequency Ordered Stop   12/10/23 2200  cefTRIAXone  (ROCEPHIN ) 2 g in sodium chloride  0.9 % 100 mL IVPB        2 g 200 mL/hr over 30 Minutes Intravenous Every 24 hours 12/10/23 0303     12/10/23 1000  metroNIDAZOLE  (FLAGYL ) IVPB 500 mg        500 mg 100 mL/hr over 60 Minutes Intravenous Every 12 hours 12/10/23 0303     12/10/23 0100  cefTRIAXone  (ROCEPHIN ) 2 g in sodium chloride  0.9 % 100 mL IVPB        2 g 200 mL/hr over 30 Minutes Intravenous  Once 12/10/23 0058 12/10/23 0154   12/10/23 0100  metroNIDAZOLE  (FLAGYL ) IVPB 500 mg        500 mg 100 mL/hr over 60 Minutes Intravenous  Once 12/10/23 0058 12/10/23 0302        Assessment/Plan S/p ex lap with SBR for internal hernia with ischemic bowel by Dr. Sebastian on 11/21 now with ileus and pancolitis - patient's history is concerning for c diff colitis given her WBC elevation as well as her extensive diarrhea.  Her first c. Diff test was negative, repeat test is pending still - agree with abx therapy for now.  Currently on Rocephin /Flagyl .  Monitor WBC, up to 21K today. Afebrile.  If this doesn't improve, then may need to transition to broader spectrum, unless her c diff returns positive. - clamp NGT this AM and allow CLD - if tolerating then ok to remove NGT later today  - continue BID WTD to midline  - if repeat c diff is negative and bowel distention improves, then may be able to try anti-diarrheal, but hesitant to do this given  appearance on scan with colitis, ileus, etc.  FEN - clamp NGT, allow CLD  VTE - heparin  gtt ID - Rocephin /Flagyl   --per TRH-- B PEs - on heparin  per medicine DM H/o CVA HTN   LOS: 2 days    Burnard JONELLE Louder , [facility] Surgery 12/12/2023, 10:05 AM Please see Amion for pager number during day hours 7:00am-4:30pm or 7:00am -11:30am on weekends

## 2023-12-12 NOTE — Consult Note (Addendum)
 WOC Nurse Consult Note: Reason for Consult: Requested to evaluate a MASD. Wound type: No skin breakdown, no blisters noted.  Redness due to losing stools, 3 episodes in the last 24 hours. Pressure Injury POA: NA Drainage (amount, consistency, odor) none.  Dressing procedure/placement/frequency: Cleanse with skin cleanser, pat dry. Apply Desitin on the perineum daily or PRN.  WOC team will not plan to follow further. Please reconsult if further assistance is needed. Thank-you,  Lela Holm MSN, RN, CNS.  (Phone [phone])

## 2023-12-12 NOTE — Progress Notes (Signed)
   12/11/23 2322  NG/OG Vented/Dual Lumen 16 Fr. Left nare  Placement Date/Time: 12/10/23 0337   Person Inserting LDA: T. Bordelon  Tube Size (Fr.): 16 Fr.  Tube Location: Left nare  Secured by: Bridle  Initial Placement Verification: Xray  Tube Position (Required) External length of tube  Measurement (cm) (Required)  New marked external length on left nare 66 cm (advanced 5 cm per x-ray recommendation, from prior external length marked at 61 cm)  Ongoing Placement Verification (Required) (See row information) Yes  Site Assessment Clean, Dry, Intact  Interventions Cleansed;Repositioned bridle  Status Low intermittent suction  Drainage Appearance Acie Seip, Coffee ground  Output (mL) 450 mL   Wendi Dash, RN

## 2023-12-12 NOTE — Significant Event (Signed)
 Rapid Response Event Note   Reason for Call : Respiratory distress  Initial Focused Assessment:  RR page received for pt in respiratory distress. Reportedly, pt stated she was having trouble breathing, sats in the 70s with labored breathing. Pt with productive cough and oral suctioning. Pt placed on 6L [place] and repositioned in the bed in semi-fowlers position. Rhonchi on Right lung, diminished on Left.  Pt WOB improved since repositioned. ON 6L [place], sats now 99%. PCXR ordered.   0435-98.90F, HR 96 SR, 112/66 (80), RR 25 with sats 99% on 6L [place].    Interventions:  -Pulmonary toileting  -Dana RILY      MD Notified: Dr. Franky [id number] Call Time:  0413 Arrival Time: 0418 End Time: 0443  Griselda Alm ORN, RN

## 2023-12-12 NOTE — Progress Notes (Signed)
 PHARMACY - ANTICOAGULATION CONSULT NOTE  Pharmacy Consult for Heparin   Indication: pulmonary embolus  Allergies  Allergen Reactions   Amoxicillin Hives   Aspirin Hives   Codeine Hives   Penicillins Hives    Vital Signs: Temp: 98.1 F (36.7 C) (12/04 0501) Temp Source: Oral (12/04 0501) BP: 107/71 (12/04 0501) Pulse Rate: 94 (12/04 0625)  Labs: Recent Labs    12/09/23 2243 12/10/23 0338 12/10/23 0913 12/10/23 1912 12/11/23 0331 12/11/23 0934 12/12/23 0337  HGB 12.6 12.9  --   --  13.0  --   --   HCT 39.3 38.5  --   --  38.7  --   --   PLT 368 384  --   --  416*  --   --   HEPARINUNFRC  --   --    < > 0.10* 0.35  --  0.32  CREATININE 0.71 0.75   < > 0.68  --  0.65 0.71   < > = values in this interval not displayed.    Estimated Creatinine Clearance: 70.9 mL/min (by C-G formula based on SCr of 0.71 mg/dL).   Medical History: Past Medical History:  Diagnosis Date   Acid reflux    Adrenal benign tumor    Apparently enlarging and patient is supposed to have it taken out   Anxiety    Depression    Diabetes mellitus without complication (HCC)    DVT (deep vein thrombosis) in pregnancy    Hypertension    Nausea vomiting and diarrhea 03/24/2021   PE (pulmonary embolism)    Stroke [facility])      Assessment: 58 y/o F 10 days post-op from small bowel resection presents to the ED with increased N/V and surgical site drainage. CT abdomen revealed a possible RLL pulmonary artery filling defect. A CT Angio study was then completed which showed bilateral PE. Start heparin . Labs above reviewed. PTA meds reviewed.   Heparin  level therapeutic this AM at 0.32  Goal of Therapy:  Heparin  level 0.3-0.7 units/ml Monitor platelets by anticoagulation protocol: Yes   Plan:  Increase heparin  to 1450 units / hr to prevent drop to < 0.3 Daily CBC/Heparin  level Monitor for bleeding  Thank you. Olam Monte, PharmD 12/12/2023 8:06 AM  Please check AMION for all [facility] Pharmacy phone  numbers After 10:00 PM, call Main Pharmacy [phone]

## 2023-12-12 NOTE — Progress Notes (Signed)
   12/12/23 1220  Spiritual Encounters  Care provided to: Patient  Referral source Patient request  OnCall Visit No   Patient requested prayer for husband that was headed to the hospital for surgery. Chaplain remains available upon request

## 2023-12-12 NOTE — Progress Notes (Addendum)
 PROGRESS NOTE    [name]  FMW:[id number] DOB: Dec 21, 1965 DOA: 12/09/2023 PCP: Freddrick, No   Brief Narrative: [name] is a 58 y.o. female with a history of hypertension, diabetes mellitus type 2, DVT, PE, left adrenal mass, small bowel obstruction status post partial bowel resection.  Patient presented secondary to abdominal pain, diarrhea, vomiting with concern for obstruction and found to have evidence of ileus in addition to pancolitis concern for infection.  Patient met sepsis criteria on admission.  General surgery consulted.  She was started empirically on antibiotics and an NG tube was placed.   Assessment and Plan:  Severe sepsis Present on admission.  Source is GI with evidence of colitis on CT imaging.  Patient started empirically on ceftriaxone  and Flagyl .  GI panel and C. difficile negative.  Blood culture obtained on admission is no growth to date. Leukocytosis initially improved to 15,600 but now worsened possibly related to acute stress from overnight events. No fevers. - Follow-up blood culture data - Continue antibiotics - Daily CBC  Pancolitis CT imaging on admission significant for evidence of pancolitis greatest about the sigmoid colon and rectum.  Patient started empirically on ceftriaxone  and Flagyl .  GI pathogen panel and C. difficile testing negative. WBC shows good response to current antibiotic regimen. - Continue ceftriaxone  and Flagyl   Ileus Likely related to recent abdominal surgery.  Diagnosed on CT imaging.  General surgery consulted.  NG tube placed on 12/2 for management. - General Surge recommendations: Continue NG tube; recommendations pending today  Acute recurrent pulmonary embolism Diagnosed on admission via CTA chest which was significant for an acute segmental and subsegmental pulm emboli in the bilateral lower lobes and left upper lobe pulmonary arteries and without evidence of right heart strain.  Hemodynamically stable.  Patient  started on heparin  IV for management. - Continue heparin  IV with plans to transition to Eliquis  once patient is closer to discharge and without concern for surgical management  Acute respiratory failure with hypoxia Unclear etiology. Possibly aspiration event. Patient with hypoxia and respiratory distress requiring application of supplemental oxygen on 12/4. Chest x-ray revealed concern for possible pulmonary edema. Unclear etiology. Transthoracic Echocardiogram this admission shows normal LV and RV function. No associated arrhythmia noted. -Check BNP -Continue oxygen and wean to room air as able -Lasix  20 mg IV x1  Metabolic acidosis Likely related to severe hypoxia overnight. CO2 down to 12 this morning. -Switch to D5 sodium bicarbonate  infusion  Acute DVT Lower extremity venous duplex scan significant for an acute DVT involving the common femoral vein, right femoral vein and right popliteal vein. Likely provoked, related to recent surgery. -Continue anticoagulation  Hypokalemia Recurrent. Improved with potassium supplementation.  Hypocalcemia Corrected calcium  from 12/2 of about 8.4.  Hypomagnesemia Patient given magnesium  supplementation. Resolved.  Nonsustained ventricular tachycardia Noted. Possibly related to electrolyte imbalances. Patient started on metoprolol . - Manage electrolytes - Continue metoprolol   Diabetes mellitus type 2 Uncontrolled with hyperglycemia based on hemoglobin A1c of 9.7%. patient started on SSI. - Continue SSI  History of CVA Noted.  No residual symptoms.  He does not appear patient is on medication management.  Left adrenal mass Noted on CT imaging.  Recommendation for surgical consultation and medical workup. [name] has already been referred to endocrinology.  Tobacco abuse Patient declined nicotine  patch.    DVT prophylaxis: Heparin  IV Code Status:   Code Status: Limited: Do not attempt resuscitation (DNR) -DNR-LIMITED -Do Not  Intubate/DNI  Family Communication: None at bedside. Patient declined  for me to update and states she will give an update herself. Disposition Plan: Discharge pending ongoing general surgery recommendations   Consultants:  General surgery  Procedures:  None  Antimicrobials: Ceftriaxone  Flagyl    Subjective: Patient with significant dyspnea overnight after waking from sleep. She is unsure what happened. It is possible she choked, but she can't say for sure. She is doing much better at this time. Still having generalized abdominal pain which has remained unchanged in intensity. She reports passing gas and having a large bowel movement overnight. No emesis overnight.  Objective: BP 107/71 (BP Location: Right Arm)   Pulse 94   Temp 98.1 F (36.7 C) (Oral)   Resp (!) 26   Wt 63.3 kg   LMP  (LMP Unknown) Comment: a few months  SpO2 98%   BMI 22.52 kg/m   Examination:  General exam: Appears calm and comfortable. Respiratory system: Mild rhonchi. Respiratory effort normal. Cardiovascular system: S1 & S2 heard, RRR. Gastrointestinal system: Abdomen is nondistended, soft and tender. Decreased bowel sounds heard. Central nervous system: Alert and oriented. No focal neurological deficits. Musculoskeletal: No edema. No calf tenderness Psychiatry: Judgement and insight appear normal. Mood & affect appropriate.    Data Reviewed: I have personally reviewed following labs and imaging studies  CBC Lab Results  Component Value Date   WBC 15.6 (H) 12/11/2023   RBC 4.14 12/11/2023   HGB 13.0 12/11/2023   HCT 38.7 12/11/2023   MCV 93.5 12/11/2023   MCH 31.4 12/11/2023   PLT 416 (H) 12/11/2023   MCHC 33.6 12/11/2023   RDW 14.2 12/11/2023   LYMPHSABS 2.8 12/10/2023   MONOABS 1.4 (H) 12/10/2023   EOSABS 0.3 12/10/2023   BASOSABS 0.1 12/10/2023     Last metabolic panel Lab Results  Component Value Date   NA 142 12/12/2023   K 3.8 12/12/2023   CL 108 12/12/2023   CO2 12 (L)  12/12/2023   BUN <5 (L) 12/12/2023   CREATININE 0.71 12/12/2023   GLUCOSE 84 12/12/2023   GFRNONAA >60 12/12/2023   GFRAA >60 02/28/2019   CALCIUM  7.8 (L) 12/12/2023   PHOS 2.3 (L) 12/03/2023   PROT 4.5 (L) 12/10/2023   ALBUMIN  2.0 (L) 12/10/2023   LABGLOB 3.0 05/01/2021   AGRATIO 1.5 05/01/2021   BILITOT 0.4 12/10/2023   ALKPHOS 57 12/10/2023   AST 11 (L) 12/10/2023   ALT 11 12/10/2023   ANIONGAP 22 (H) 12/12/2023    GFR: Estimated Creatinine Clearance: 70.9 mL/min (by C-G formula based on SCr of 0.71 mg/dL).  Recent Results (from the past 240 hours)  Gastrointestinal Panel by PCR , Stool     Status: None   Collection Time: 12/10/23 12:57 AM   Specimen: Stool  Result Value Ref Range Status   Campylobacter species NOT DETECTED NOT DETECTED Final   Plesimonas shigelloides NOT DETECTED NOT DETECTED Final   Salmonella species NOT DETECTED NOT DETECTED Final   Yersinia enterocolitica NOT DETECTED NOT DETECTED Final   Vibrio species NOT DETECTED NOT DETECTED Final   Vibrio cholerae NOT DETECTED NOT DETECTED Final   Enteroaggregative E coli (EAEC) NOT DETECTED NOT DETECTED Final   Enteropathogenic E coli (EPEC) NOT DETECTED NOT DETECTED Final   Enterotoxigenic E coli (ETEC) NOT DETECTED NOT DETECTED Final   Shiga like toxin producing E coli (STEC) NOT DETECTED NOT DETECTED Final   Shigella/Enteroinvasive E coli (EIEC) NOT DETECTED NOT DETECTED Final   Cryptosporidium NOT DETECTED NOT DETECTED Final   Cyclospora cayetanensis NOT DETECTED NOT  DETECTED Final   Entamoeba histolytica NOT DETECTED NOT DETECTED Final   Giardia lamblia NOT DETECTED NOT DETECTED Final   Adenovirus F40/41 NOT DETECTED NOT DETECTED Final   Astrovirus NOT DETECTED NOT DETECTED Final   Norovirus GI/GII NOT DETECTED NOT DETECTED Final   Rotavirus A NOT DETECTED NOT DETECTED Final   Sapovirus (I, II, IV, and V) NOT DETECTED NOT DETECTED Final    Comment: Performed at [facility], [address] [place], [place] [address]  C Difficile Quick Screen w PCR reflex     Status: None   Collection Time: 12/10/23 12:57 AM   Specimen: Stool  Result Value Ref Range Status   C Diff antigen NEGATIVE NEGATIVE Final   C Diff toxin NEGATIVE NEGATIVE Final   C Diff interpretation No C. difficile detected.  Final    Comment: Performed at [facility], [address], [place], [place] [address]  Culture, blood (single) w Reflex to ID Panel     Status: None (Preliminary result)   Collection Time: 12/10/23  9:05 AM   Specimen: BLOOD RIGHT HAND  Result Value Ref Range Status   Specimen Description BLOOD RIGHT HAND  Final   Special Requests   Final    BOTTLES DRAWN AEROBIC ONLY Blood Culture adequate volume   Culture   Final    NO GROWTH < 24 HOURS Performed at [facility], [address], [place], [place] [address]    Report Status PENDING  Incomplete      Radiology Studies: DG Chest Port 1 View Result Date: 12/12/2023 EXAM: 1 VIEW(S) XRAY OF THE CHEST 12/12/2023 04:39:32 AM COMPARISON: 10/17/2023. CTA chest 12/10/2023. CLINICAL HISTORY: 59 year old female. Acute respiratory distress. Recently diagnosed acute pulmonary emboli. FINDINGS: LINES, TUBES AND DEVICES: Satisfactory enteric tube placement into the stomach with the side hole at the level of the gastric body. LUNGS AND PLEURA: New right perihilar opacity. Diffuse interstitial opacities. Blunting of costophrenic angles. Chronic appearing hyperinflation. Acute on chronic diffuse increased pulmonary interstitium. Trace new fluid in the right minor fissure. Colon emphysema demonstrated on recent CTA. No pneumothorax. 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 BONES AND SOFT TISSUES: No acute osseous abnormality. UPPER ABDOMEN: Negative visible bowel gas pattern. IMPRESSION: 1. Emphysema, hyperinflation with interval diffuse increased pulmonary interstitium and small volume pleural fluid. Favor  acute pulmonary edema over viral/atypical respiratory infection. 2. Satisfactory enteric tube position in the stomach. Electronically signed by: Helayne Hurst MD 12/12/2023 05:01 AM EST RP Workstation: [id number]   DG Abd Portable 1V Result Date: 12/11/2023 EXAM: 1 VIEW XRAY OF THE ABDOMEN 12/11/2023 11:54:00 AM COMPARISON: 01/02/2024 CLINICAL HISTORY: 01250 Ileus (HCC) [id number] Ileus (HCC) 01250 FINDINGS: LINES, TUBES AND DEVICES: Enteric tube is in the stomach, advanced from prior study. BOWEL: Nonobstructive bowel gas pattern. Single mildly prominent right lower quadrant small bowel loop. SOFT TISSUES: No opaque urinary calculi. BONES: No acute osseous abnormality. IMPRESSION: 1. No bowel obstruction. 2. Enteric tube tip projects over the stomach, appropriately advanced from the prior study. Electronically signed by: Franky Crease MD 12/11/2023 04:43 PM EST RP Workstation: [id number]   VAS US  LOWER EXTREMITY VENOUS (DVT) Result Date: 12/11/2023  Lower Venous DVT Study Patient Name:  MARILY KONCZAL  Date of Exam:   12/11/2023 Medical Rec #: [id number]          Accession #:    [id number] Date of Birth: 10-02-1965  Patient Gender: F Patient Age:   68 years Exam Location:  [facility] Procedure:      VAS US  LOWER EXTREMITY VENOUS (DVT) Referring Phys: MAXIMINO SHARPS --------------------------------------------------------------------------------  Indications: Swelling, and pulmonary embolism. Other Indications: History of DVT/PE on chronic anticoagulation. Risk Factors: Stroke, left adrenal mass, and recent small bowel obstruction with gangrenous bowel requiring exploratory laparotomy with partial bowel resection. Comparison Study: 10/17/23 - negative Performing Technologist: Ricka Sturdivant-Jones RDMS, RVT  Examination Guidelines: A complete evaluation includes B-mode imaging, spectral Doppler, color Doppler, and power Doppler as needed of all accessible portions of each vessel. Bilateral testing is  considered an integral part of a complete examination. Limited examinations for reoccurring indications may be performed as noted. The reflux portion of the exam is performed with the patient in reverse Trendelenburg.  +---------+---------------+---------+-----------+----------+--------------+ RIGHT    CompressibilityPhasicitySpontaneityPropertiesThrombus Aging +---------+---------------+---------+-----------+----------+--------------+ CFV      Partial        Yes      Yes                  Chronic        +---------+---------------+---------+-----------+----------+--------------+ SFJ      Full                                                        +---------+---------------+---------+-----------+----------+--------------+ FV Prox  None                                                        +---------+---------------+---------+-----------+----------+--------------+ FV Mid   None           No       No                                  +---------+---------------+---------+-----------+----------+--------------+ FV DistalNone                                                        +---------+---------------+---------+-----------+----------+--------------+ PFV      Full                                                        +---------+---------------+---------+-----------+----------+--------------+ POP      None           No       No                                  +---------+---------------+---------+-----------+----------+--------------+ PTV      Full                                                        +---------+---------------+---------+-----------+----------+--------------+  PERO     Full                                                        +---------+---------------+---------+-----------+----------+--------------+ EIV                     Yes      Yes                  Patent          +---------+---------------+---------+-----------+----------+--------------+ CIV                     Yes      Yes                  Patent         +---------+---------------+---------+-----------+----------+--------------+   +---------+---------------+---------+-----------+----------+--------------+ LEFT     CompressibilityPhasicitySpontaneityPropertiesThrombus Aging +---------+---------------+---------+-----------+----------+--------------+ CFV      Full           Yes      Yes                                 +---------+---------------+---------+-----------+----------+--------------+ SFJ      Full                                                        +---------+---------------+---------+-----------+----------+--------------+ FV Prox  Full                                                        +---------+---------------+---------+-----------+----------+--------------+ FV Mid   Full           Yes      Yes                                 +---------+---------------+---------+-----------+----------+--------------+ FV DistalFull                                                        +---------+---------------+---------+-----------+----------+--------------+ PFV      Full                                                        +---------+---------------+---------+-----------+----------+--------------+ POP      Full           Yes      Yes                                 +---------+---------------+---------+-----------+----------+--------------+ PTV  Full                                                        +---------+---------------+---------+-----------+----------+--------------+ PERO     Full                                                        +---------+---------------+---------+-----------+----------+--------------+ EIV                     Yes      Yes                  Patent          +---------+---------------+---------+-----------+----------+--------------+     Summary: RIGHT: - Findings consistent with acute deep vein thrombosis involving the right common femoral vein, right femoral vein, and right popliteal vein.  - No cystic structure found in the popliteal fossa.  LEFT: - There is no evidence of deep vein thrombosis in the lower extremity.  - No cystic structure found in the popliteal fossa.  *See table(s) above for measurements and observations. Electronically signed by Debby Robertson on 12/11/2023 at 4:08:35 PM.    Final    ECHOCARDIOGRAM COMPLETE Result Date: 12/10/2023    ECHOCARDIOGRAM REPORT   Patient Name:   TASHIBA TIMONEY Date of Exam: 12/10/2023 Medical Rec #:  [id number]         Height:       66.0 in Accession #:    [id number]        Weight:       137.6 lb Date of Birth:  Jun 05, 1965         BSA:          1.706 m Patient Age:    58 years          BP:           96/62 mmHg Patient Gender: F                 HR:           103 bpm. Exam Location:  Inpatient Procedure: 2D Echo, Cardiac Doppler and Color Doppler (Both Spectral and Color            Flow Doppler were utilized during procedure). Indications:    Pulmonary Embolus  History:        Patient has prior history of Echocardiogram examinations, most                 recent 03/24/2021. Risk Factors:Diabetes and Hypertension. CKD.  Sonographer:    Philomena Daring Referring Phys: [id number] JUSTIN B HOWERTER IMPRESSIONS  1. Left ventricular ejection fraction, by estimation, is 60 to 65%. The left ventricle has normal function. The left ventricle has no regional wall motion abnormalities. There is mild concentric left ventricular hypertrophy. Left ventricular diastolic parameters were normal.  2. Right ventricular systolic function is normal. The right ventricular size is normal.  3. The mitral valve is normal in structure. Moderate mitral valve regurgitation. No evidence of mitral stenosis.  4. The aortic valve is normal in structure. Aortic  valve regurgitation is not visualized. Aortic valve sclerosis is  present, with no evidence of aortic valve stenosis.  5. The inferior vena cava is normal in size with greater than 50% respiratory variability, suggesting right atrial pressure of 3 mmHg. FINDINGS  Left Ventricle: Left ventricular ejection fraction, by estimation, is 60 to 65%. The left ventricle has normal function. The left ventricle has no regional wall motion abnormalities. The left ventricular internal cavity size was normal in size. There is  mild concentric left ventricular hypertrophy. Left ventricular diastolic parameters were normal. Right Ventricle: The right ventricular size is normal. No increase in right ventricular wall thickness. Right ventricular systolic function is normal. Left Atrium: Left atrial size was normal in size. Right Atrium: Right atrial size was normal in size. Pericardium: There is no evidence of pericardial effusion. Mitral Valve: The mitral valve is normal in structure. Moderate mitral valve regurgitation. No evidence of mitral valve stenosis. Tricuspid Valve: The tricuspid valve is normal in structure. Tricuspid valve regurgitation is mild . No evidence of tricuspid stenosis. Aortic Valve: The aortic valve is normal in structure. Aortic valve regurgitation is not visualized. Aortic valve sclerosis is present, with no evidence of aortic valve stenosis. Pulmonic Valve: The pulmonic valve was normal in structure. Pulmonic valve regurgitation is not visualized. No evidence of pulmonic stenosis. Aorta: The aortic root is normal in size and structure. Venous: The inferior vena cava is normal in size with greater than 50% respiratory variability, suggesting right atrial pressure of 3 mmHg. IAS/Shunts: No atrial level shunt detected by color flow Doppler.  LEFT VENTRICLE PLAX 2D LVIDd:         3.70 cm   Diastology LVIDs:         2.50 cm   LV e' medial:    7.72 cm/s LV PW:         1.10 cm   LV E/e' medial:  24.9 LV IVS:         1.10 cm   LV e' lateral:   8.27 cm/s LVOT diam:     1.72 cm   LV E/e' lateral: 23.2 LV SV:         57 LV SV Index:   34 LVOT Area:     2.32 cm  RIGHT VENTRICLE             IVC RV Basal diam:  2.96 cm     IVC diam: 1.93 cm RV Mid diam:    2.06 cm RV S prime:     18.00 cm/s TAPSE (M-mode): 1.8 cm LEFT ATRIUM             Index        RIGHT ATRIUM           Index LA diam:        3.73 cm 2.19 cm/m   RA Area:     10.80 cm LA Vol (A2C):   45.4 ml 26.62 ml/m  RA Volume:   18.50 ml  10.85 ml/m LA Vol (A4C):   46.1 ml 27.03 ml/m LA Biplane Vol: 48.3 ml 28.32 ml/m  AORTIC VALVE LVOT Vmax:   149.00 cm/s LVOT Vmean:  99.200 cm/s LVOT VTI:    0.246 m  AORTA Ao Root diam: 2.72 cm Ao Asc diam:  3.54 cm MITRAL VALVE                TRICUSPID VALVE MV Area (PHT): 4.21 cm     TR Peak grad:   37.7 mmHg MV Decel Time: 180 msec  TR Vmax:        307.00 cm/s MV E velocity: 192.00 cm/s MV A velocity: 212.00 cm/s  SHUNTS MV E/A ratio:  0.91         Systemic VTI:  0.25 m                             Systemic Diam: 1.72 cm Dub Tobb DO Electronically signed by Dub Huntsman DO Signature Date/Time: 12/10/2023/12:16:23 PM    Final       LOS: 2 days    Elgin Lam, MD [facility] 12/12/2023, 7:57 AM   If 7PM-7AM, please contact night-coverage [email or web address]

## 2023-12-12 NOTE — Progress Notes (Signed)
 Heard from outside pt's room pt stating I can't breath. Upon entering room, pt was observed to be in distress with tachypnea and 02 saturation in the low 70's. Assisted pt to set up in the bed, provided suctioning w/ Yankauer/catheter and applied supplemental 02. Encouraged deep breathing in coughing. Rhonchi auscultated on bilateral lungs, more severe in R side. Rapid response called to room. In the interim, pt was able to cough up and successfully suction a large amount of clear/yellow mucous. 02 sats were recovered and 02 titrated down. RR nurse ordered STAT CXR. Will notify MD and primary RN.

## 2023-12-12 NOTE — Progress Notes (Signed)
   12/12/23 0500  Spiritual Encounters  Type of Visit Initial  Care provided to: Patient  Conversation partners present during encounter Nurse  Reason for visit Urgent spiritual support  OnCall Visit Yes   Chaplain was paged to rapid response. RN stated that the patient was in respiratory distress and everything is under control now. Sianne reported feeling better at this time. Chaplain remains available if needed.   M.Kubra Susanna Kerry Resident [phone]

## 2023-12-12 NOTE — Progress Notes (Signed)
 Dr.Kakrakandy was notified for the stat chest x ray result as documented below.  IMPRESSION: 1. Emphysema, hyperinflation with interval diffuse increased pulmonary interstitium and small volume pleural fluid. Favor acute pulmonary edema over viral/atypical respiratory infection. 2. Satisfactory enteric tube position in the stomach. (after advanced 5 cm)   Pt is alert and fully oriented x 4, SPO2 100% on 2 LPM of NCL, RR 23-28, BP stable,  remains afebrile, NSR/ ST on the monitor, HR 90-105. She is able to rest comfortably at this moment. No complaints at this moment.   Pt is NPO. CBG 118 mg/dl this am. Continue infusion D5% NSS 75 ml/hr.  Heparin  gtt 1,400 units/hr.  Potassium 3.8 after getting replacement yesterday.    Total intake 1,462.9 ml, total output total 2,050 ml ( urine output 1,000, gastric content 1,050 ml appears to be bile green and brownish like mix with coffee ground.) Continue with low intermittent suction.  I&O is negative 587 ml total in 24 hours.  We will hand-off to the day shift team. .   Wendi Dash, RN

## 2023-12-13 DIAGNOSIS — K567 Ileus, unspecified: Secondary | ICD-10-CM

## 2023-12-13 DIAGNOSIS — K56600 Partial intestinal obstruction, unspecified as to cause: Secondary | ICD-10-CM | POA: Diagnosis not present

## 2023-12-13 DIAGNOSIS — I2699 Other pulmonary embolism without acute cor pulmonale: Secondary | ICD-10-CM | POA: Diagnosis not present

## 2023-12-13 DIAGNOSIS — R197 Diarrhea, unspecified: Secondary | ICD-10-CM

## 2023-12-13 DIAGNOSIS — J9601 Acute respiratory failure with hypoxia: Secondary | ICD-10-CM | POA: Diagnosis not present

## 2023-12-13 DIAGNOSIS — K9189 Other postprocedural complications and disorders of digestive system: Secondary | ICD-10-CM | POA: Diagnosis not present

## 2023-12-13 LAB — GLUCOSE, CAPILLARY
Glucose-Capillary: 125 mg/dL — ABNORMAL HIGH (ref 70–99)
Glucose-Capillary: 131 mg/dL — ABNORMAL HIGH (ref 70–99)
Glucose-Capillary: 150 mg/dL — ABNORMAL HIGH (ref 70–99)
Glucose-Capillary: 158 mg/dL — ABNORMAL HIGH (ref 70–99)

## 2023-12-13 LAB — BASIC METABOLIC PANEL WITH GFR
Anion gap: 9 (ref 5–15)
BUN: <5 mg/dL — ABNORMAL LOW (ref 6–20)
CO2: 29 mmol/L (ref 22–32)
Calcium: 7.4 mg/dL — ABNORMAL LOW (ref 8.9–10.3)
Chloride: 104 mmol/L (ref 98–111)
Creatinine, Ser: 0.61 mg/dL (ref 0.44–1.00)
GFR, Estimated: >60 mL/min (ref >60)
Glucose, Bld: 142 mg/dL — ABNORMAL HIGH (ref 70–99)
Potassium: 2.4 mmol/L — CL (ref 3.5–5.1)
Sodium: 142 mmol/L (ref 135–145)

## 2023-12-13 LAB — CBC
HCT: 34.8 % — ABNORMAL LOW (ref 36.0–46.0)
Hemoglobin: 11.8 g/dL — ABNORMAL LOW (ref 12.0–15.0)
MCH: 31.2 pg (ref 26.0–34.0)
MCHC: 33.9 g/dL (ref 30.0–36.0)
MCV: 92.1 fL (ref 80.0–100.0)
Platelets: 368 K/uL (ref 150–400)
RBC: 3.78 MIL/uL — ABNORMAL LOW (ref 3.87–5.11)
RDW: 14 % (ref 11.5–15.5)
WBC: 12 K/uL — ABNORMAL HIGH (ref 4.0–10.5)
nRBC: 0 % (ref 0.0–0.2)

## 2023-12-13 LAB — POTASSIUM: Potassium: 2.8 mmol/L — ABNORMAL LOW (ref 3.5–5.1)

## 2023-12-13 LAB — MAGNESIUM: Magnesium: 1.4 mg/dL — ABNORMAL LOW (ref 1.7–2.4)

## 2023-12-13 LAB — HEPARIN LEVEL (UNFRACTIONATED): Heparin Unfractionated: 0.44 [IU]/mL (ref 0.30–0.70)

## 2023-12-13 MED ORDER — POTASSIUM CHLORIDE CRYS ER 20 MEQ PO TBCR
40.0000 meq | EXTENDED_RELEASE_TABLET | Freq: Once | ORAL | Status: AC
  Administered 2023-12-14: 40 meq via ORAL
  Filled 2023-12-13: qty 2

## 2023-12-13 MED ORDER — POTASSIUM CHLORIDE CRYS ER 20 MEQ PO TBCR
40.0000 meq | EXTENDED_RELEASE_TABLET | ORAL | Status: AC
  Administered 2023-12-13 (×2): 40 meq via ORAL
  Filled 2023-12-13 (×2): qty 2

## 2023-12-13 MED ORDER — CHOLESTYRAMINE LIGHT 4 G PO PACK
4.0000 g | PACK | Freq: Two times a day (BID) | ORAL | Status: DC
  Administered 2023-12-13 – 2023-12-17 (×8): 4 g via ORAL
  Filled 2023-12-13 (×11): qty 1

## 2023-12-13 MED ORDER — DIPHENOXYLATE-ATROPINE 2.5-0.025 MG PO TABS
1.0000 | ORAL_TABLET | Freq: Two times a day (BID) | ORAL | Status: DC
  Administered 2023-12-13 – 2023-12-17 (×7): 1 via ORAL
  Filled 2023-12-13 (×8): qty 1

## 2023-12-13 MED ORDER — BOOST / RESOURCE BREEZE PO LIQD CUSTOM
1.0000 | Freq: Three times a day (TID) | ORAL | Status: DC
  Administered 2023-12-14 – 2023-12-17 (×10): 1 via ORAL

## 2023-12-13 NOTE — Progress Notes (Signed)
 PHARMACY - ANTICOAGULATION CONSULT NOTE  Pharmacy Consult for Heparin   Indication: pulmonary embolus  Allergies  Allergen Reactions   Amoxicillin Hives   Aspirin Hives   Codeine Hives   Penicillins Hives    Vital Signs: Temp: 98.6 F (37 C) (12/05 0400) Temp Source: Oral (12/05 0800) BP: 102/71 (12/05 0600) Pulse Rate: 89 (12/05 0603)  Labs: Recent Labs    12/10/23 1912 12/10/23 1912 12/11/23 0331 12/11/23 0934 12/12/23 0337 12/12/23 0718 12/13/23 0255  HGB  --    < > 13.0  --   --  12.9 11.8*  HCT  --   --  38.7  --   --  38.6 34.8*  PLT  --   --  416*  --   --  405* 368  HEPARINUNFRC 0.10*  --  0.35  --  0.32  --  0.44  CREATININE 0.68  --   --  0.65 0.71  --   --    < > = values in this interval not displayed.    Estimated Creatinine Clearance: 70.9 mL/min (by C-G formula based on SCr of 0.71 mg/dL).   Medical History: Past Medical History:  Diagnosis Date   Acid reflux    Adrenal benign tumor    Apparently enlarging and patient is supposed to have it taken out   Anxiety    Depression    Diabetes mellitus without complication (HCC)    DVT (deep vein thrombosis) in pregnancy    Hypertension    Nausea vomiting and diarrhea 03/24/2021   PE (pulmonary embolism)    Stroke [facility])      Assessment: 58 y/o F 10 days post-op from small bowel resection presents to the ED with increased N/V and surgical site drainage. CT abdomen revealed a possible RLL pulmonary artery filling defect. A CT Angio study was then completed which showed bilateral PE. Start heparin . Labs above reviewed. PTA meds reviewed.     Goal of Therapy:  Heparin  level 0.3-0.7 units/ml Monitor platelets by anticoagulation protocol: Yes   Plan:  Continue heparin  at 1450 units / hr Daily CBC/Heparin  level Monitor for bleeding  Thank you. Olam Monte, PharmD 12/13/2023 8:50 AM  Please check AMION for all [facility] Pharmacy phone numbers After 10:00 PM, call Main Pharmacy [phone]

## 2023-12-13 NOTE — Progress Notes (Signed)
 Subjective/Chief Complaint: NGT clamped No N/V Multiple BMs   Objective: Vital signs in last 24 hours: Temp:  [98 F (36.7 C)-98.6 F (37 C)] 98.6 F (37 C) (12/05 0400) Pulse Rate:  [86-104] 89 (12/05 0603) Resp:  [12-20] 20 (12/05 0800) BP: (90-116)/(58-84) 102/71 (12/05 0600) SpO2:  [97 %-100 %] 99 % (12/05 0603) Weight:  [63.6 kg] 63.6 kg (12/05 0603) Last BM Date : 12/13/23  Intake/Output from previous day: 12/04 0701 - 12/05 0700 In: 2415.5 [P.O.:800; I.V.:1065.5; NG/GT:50; IV Piggyback:500] Out: 1250 [Urine:1250] Intake/Output this shift: No intake/output data recorded.    Lab Results:  Recent Labs    12/12/23 0718 12/13/23 0255  WBC 21.2* 12.0*  HGB 12.9 11.8*  HCT 38.6 34.8*  PLT 405* 368   BMET Recent Labs    12/11/23 0934 12/11/23 1534 12/12/23 0337  NA 140  --  142  K 2.7* 2.9* 3.8  CL 106  --  108  CO2 20*  --  12*  GLUCOSE 84  --  84  BUN <5*  --  <5*  CREATININE 0.65  --  0.71  CALCIUM  7.2*  --  7.8*   PT/INR No results for input(s): LABPROT, INR in the last 72 hours. ABG No results for input(s): PHART, HCO3 in the last 72 hours.  Invalid input(s): PCO2, PO2  Studies/Results: DG Chest Port 1 View Result Date: 12/12/2023 EXAM: 1 VIEW(S) XRAY OF THE CHEST 12/12/2023 04:39:32 AM COMPARISON: 10/17/2023. CTA chest 12/10/2023. CLINICAL HISTORY: 58 year old female. Acute respiratory distress. Recently diagnosed acute pulmonary emboli. FINDINGS: LINES, TUBES AND DEVICES: Satisfactory enteric tube placement into the stomach with the side hole at the level of the gastric body. LUNGS AND PLEURA: New right perihilar opacity. Diffuse interstitial opacities. Blunting of costophrenic angles. Chronic appearing hyperinflation. Acute on chronic diffuse increased pulmonary interstitium. Trace new fluid in the right minor fissure. Colon emphysema demonstrated on recent CTA. No pneumothorax. 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 BONES AND SOFT TISSUES: No acute osseous abnormality. UPPER ABDOMEN: Negative visible bowel gas pattern. IMPRESSION: 1. Emphysema, hyperinflation with interval diffuse increased pulmonary interstitium and small volume pleural fluid. Favor acute pulmonary edema over viral/atypical respiratory infection. 2. Satisfactory enteric tube position in the stomach. Electronically signed by: Helayne Hurst MD 12/12/2023 05:01 AM EST RP Workstation: [id number]   DG Abd Portable 1V Result Date: 12/11/2023 EXAM: 1 VIEW XRAY OF THE ABDOMEN 12/11/2023 11:54:00 AM COMPARISON: 01/02/2024 CLINICAL HISTORY: 01250 Ileus (HCC) 98749 Ileus (HCC) 01250 FINDINGS: LINES, TUBES AND DEVICES: Enteric tube is in the stomach, advanced from prior study. BOWEL: Nonobstructive bowel gas pattern. Single mildly prominent right lower quadrant small bowel loop. SOFT TISSUES: No opaque urinary calculi. BONES: No acute osseous abnormality. IMPRESSION: 1. No bowel obstruction. 2. Enteric tube tip projects over the stomach, appropriately advanced from the prior study. Electronically signed by: Franky Crease MD 12/11/2023 04:43 PM EST RP Workstation: [id number]   VAS US  LOWER EXTREMITY VENOUS (DVT) Result Date: 12/11/2023  Lower Venous DVT Study Patient Name:  [name]  Date of Exam:   12/11/2023 Medical Rec #: [id number]          Accession #:    [id number] Date of Birth: 04-19-1965          Patient Gender: F Patient Age:   58 years Exam Location:  [facility] Procedure:      VAS US  LOWER EXTREMITY VENOUS (DVT) Referring Phys: MAXIMINO SHARPS --------------------------------------------------------------------------------  Indications: Swelling, and pulmonary embolism. Other Indications: History of DVT/PE on chronic anticoagulation. Risk Factors: Stroke, left adrenal mass, and recent small bowel obstruction with gangrenous bowel requiring exploratory laparotomy with partial bowel resection. Comparison Study:  10/17/23 - negative Performing Technologist: Ricka Sturdivant-Jones RDMS, RVT  Examination Guidelines: A complete evaluation includes B-mode imaging, spectral Doppler, color Doppler, and power Doppler as needed of all accessible portions of each vessel. Bilateral testing is considered an integral part of a complete examination. Limited examinations for reoccurring indications may be performed as noted. The reflux portion of the exam is performed with the patient in reverse Trendelenburg.  +---------+---------------+---------+-----------+----------+--------------+ RIGHT    CompressibilityPhasicitySpontaneityPropertiesThrombus Aging +---------+---------------+---------+-----------+----------+--------------+ CFV      Partial        Yes      Yes                  Chronic        +---------+---------------+---------+-----------+----------+--------------+ SFJ      Full                                                        +---------+---------------+---------+-----------+----------+--------------+ FV Prox  None                                                        +---------+---------------+---------+-----------+----------+--------------+ FV Mid   None           No       No                                  +---------+---------------+---------+-----------+----------+--------------+ FV DistalNone                                                        +---------+---------------+---------+-----------+----------+--------------+ PFV      Full                                                        +---------+---------------+---------+-----------+----------+--------------+ POP      None           No       No                                  +---------+---------------+---------+-----------+----------+--------------+ PTV      Full                                                        +---------+---------------+---------+-----------+----------+--------------+ PERO      Full                                                        +---------+---------------+---------+-----------+----------+--------------+  EIV                     Yes      Yes                  Patent         +---------+---------------+---------+-----------+----------+--------------+ CIV                     Yes      Yes                  Patent         +---------+---------------+---------+-----------+----------+--------------+   +---------+---------------+---------+-----------+----------+--------------+ LEFT     CompressibilityPhasicitySpontaneityPropertiesThrombus Aging +---------+---------------+---------+-----------+----------+--------------+ CFV      Full           Yes      Yes                                 +---------+---------------+---------+-----------+----------+--------------+ SFJ      Full                                                        +---------+---------------+---------+-----------+----------+--------------+ FV Prox  Full                                                        +---------+---------------+---------+-----------+----------+--------------+ FV Mid   Full           Yes      Yes                                 +---------+---------------+---------+-----------+----------+--------------+ FV DistalFull                                                        +---------+---------------+---------+-----------+----------+--------------+ PFV      Full                                                        +---------+---------------+---------+-----------+----------+--------------+ POP      Full           Yes      Yes                                 +---------+---------------+---------+-----------+----------+--------------+ PTV      Full                                                        +---------+---------------+---------+-----------+----------+--------------+ PERO  Full                                                         +---------+---------------+---------+-----------+----------+--------------+ EIV                     Yes      Yes                  Patent         +---------+---------------+---------+-----------+----------+--------------+     Summary: RIGHT: - Findings consistent with acute deep vein thrombosis involving the right common femoral vein, right femoral vein, and right popliteal vein.  - No cystic structure found in the popliteal fossa.  LEFT: - There is no evidence of deep vein thrombosis in the lower extremity.  - No cystic structure found in the popliteal fossa.  *See table(s) above for measurements and observations. Electronically signed by Debby Robertson on 12/11/2023 at 4:08:35 PM.    Final     Anti-infectives: Anti-infectives (From admission, onward)    Start     Dose/Rate Route Frequency Ordered Stop   12/10/23 2200  cefTRIAXone  (ROCEPHIN ) 2 g in sodium chloride  0.9 % 100 mL IVPB        2 g 200 mL/hr over 30 Minutes Intravenous Every 24 hours 12/10/23 0303     12/10/23 1000  metroNIDAZOLE  (FLAGYL ) IVPB 500 mg        500 mg 100 mL/hr over 60 Minutes Intravenous Every 12 hours 12/10/23 0303     12/10/23 0100  cefTRIAXone  (ROCEPHIN ) 2 g in sodium chloride  0.9 % 100 mL IVPB        2 g 200 mL/hr over 30 Minutes Intravenous  Once 12/10/23 0058 12/10/23 0154   12/10/23 0100  metroNIDAZOLE  (FLAGYL ) IVPB 500 mg        500 mg 100 mL/hr over 60 Minutes Intravenous  Once 12/10/23 0058 12/10/23 0302       Assessment/Plan: S/p ex lap with SBR for internal hernia with ischemic bowel by Dr. Sebastian on 11/21 now with ileus and pancolitis   Seems better today   Abd: soft,  less distended, NGT in place clamped since 8 am yesterday , midline wound base is pale with some fibrinous exudate, but no overt infection. Fascia is intact. Some diffuse tenderness, but no guarding/peritonitis.    -  Her first c. Diff test was negative, repeat test was not cleared by ID so not repeated     - agree with abx therapy for now.  Currently on Rocephin /Flagyl .  Monitor WBC 12 K today. Afebrile . - dc  NGT this AM and allow CLD -  - continue BID WTD to midline  -  Add cholestyramine  for now    RECOMMEND GI consult    FEN - dc  NGT, allow CLD VTE - heparin  gtt ID - Rocephin /Flagyl    LOS: 3 days    Debby DELENA Shipper MD  12/13/2023

## 2023-12-13 NOTE — Progress Notes (Signed)
 Pt has had multiple diarrhea with continue running of greenish brown watery stool. Her perineal area is red. Requiring frequent cleaning to preserve her skin integrity. We have been applying skin barrier cream for her. Dr. Franky notified if the Pt can be a good candidate for rectal tube fecal management.   Respectfully, Dr. Franky suggested that with pancolitis, Flexiseal should be avoided; considered it may cause perforation. Plan of care is reviewed. Pt has been stable hemodynamically.  We will hand off to the day shift team..   Wendi Dash, RN

## 2023-12-13 NOTE — Progress Notes (Signed)
 PROGRESS NOTE    [name]  FMW:[id number] DOB: Feb 02, 1965 DOA: 12/09/2023 PCP: Freddrick, No   Brief Narrative: [name] is a 58 y.o. female with a history of hypertension, diabetes mellitus type 2, DVT, PE, left adrenal mass, small bowel obstruction status post partial bowel resection.  Patient presented secondary to abdominal pain, diarrhea, vomiting with concern for obstruction and found to have evidence of ileus in addition to pancolitis concern for infection.  Patient met sepsis criteria on admission.  General surgery consulted.  She was started empirically on antibiotics and an NG tube was placed.   Assessment and Plan:  Severe sepsis Present on admission.  Source is GI with evidence of colitis on CT imaging.  Patient started empirically on ceftriaxone  and Flagyl .  GI panel and C. difficile negative.  Blood culture obtained on admission is no growth to date. Leukocytosis improved to 12,000. Afebrile. - Continue to follow-up blood culture data - Continue antibiotics - Daily CBC  Pancolitis CT imaging on admission significant for evidence of pancolitis greatest about the sigmoid colon and rectum.  Patient started empirically on ceftriaxone  and Flagyl .  GI pathogen panel and C. difficile testing negative. WBC shows good response to current antibiotic regimen. - Continue ceftriaxone  and Flagyl   Ileus Likely related to recent abdominal surgery.  Diagnosed on CT imaging.  General surgery consulted.  NG tube placed on 12/2 for management. - General Surge recommendations: NG tube clamped, clear liquid diet; recommendations pending today  Acute recurrent pulmonary embolism Diagnosed on admission via CTA chest which was significant for an acute segmental and subsegmental pulm emboli in the bilateral lower lobes and left upper lobe pulmonary arteries and without evidence of right heart strain.  Hemodynamically stable.  Patient started on heparin  IV for management. - Continue  heparin  IV with plans to transition to Eliquis  once patient is closer to discharge and without concern for surgical management  Acute respiratory failure with hypoxia Patient appears to have developed acute pulmonary edema of unclear etiology. Could possibly also have had an aspiration event. Patient with hypoxia and respiratory distress requiring application of supplemental oxygen on 12/4. Chest x-ray revealed concern for possible pulmonary edema and BNP elevated at 721.9. Transthoracic Echocardiogram this admission shows normal LV and RV function. No associated arrhythmia noted. Appears to be improved with Lasix . -Continue oxygen and wean to room air as able  Metabolic acidosis Likely related to severe hypoxia overnight. CO2 down to 12. -Continue D5 sodium bicarbonate  infusion for now pending BMP results -Follow-up BMP  Acute DVT Lower extremity venous duplex scan significant for an acute DVT involving the common femoral vein, right femoral vein and right popliteal vein. Likely provoked, related to recent surgery. -Continue anticoagulation  Hypokalemia Recurrent. Improved with potassium supplementation.  Hypocalcemia Corrected calcium  from 12/2 of about 8.4.  Hypomagnesemia Patient given magnesium  supplementation. Resolved.  Nonsustained ventricular tachycardia Noted. Possibly related to electrolyte imbalances. Patient started on metoprolol . - Manage electrolytes - Discontinue metoprolol   Diabetes mellitus type 2 Uncontrolled with hyperglycemia based on hemoglobin A1c of 9.7%. patient started on SSI. - Continue SSI  History of CVA Noted.  No residual symptoms.  He does not appear patient is on medication management.  Left adrenal mass Noted on CT imaging.  Recommendation for surgical consultation and medical workup. Paitent has already been referred to endocrinology.  Tobacco abuse Patient declined nicotine  patch.    DVT prophylaxis: Heparin  IV Code Status:   Code  Status: Limited: Do not attempt resuscitation (DNR) -  DNR-LIMITED -Do Not Intubate/DNI  Family Communication: None at bedside. Patient declined for me to update family Disposition Plan: Discharge pending ongoing general surgery recommendations   Consultants:  General surgery  Procedures:  None  Antimicrobials: Ceftriaxone  Flagyl    Subjective: Patient expresses frustration with her requiring ongoing care, especially as it relates to her oral intake and the NG tube. Patient has been having diarrhea overnight as well. Afebrile. Abdominal pain is present, per patient, but she concedes it does seem improved.  Objective: BP 102/71   Pulse 89   Temp 98.6 F (37 C) (Oral)   Resp 20   Ht 5' 6 (1.676 m)   Wt 63.6 kg   LMP  (LMP Unknown) Comment: a few months  SpO2 99%   BMI 22.63 kg/m   Examination:  General exam: Appears frustrated but otherwise comfortable. Respiratory system: Mild rhonchi. Respiratory effort normal. Cardiovascular system: S1 & S2 heard, RRR. Gastrointestinal system: Abdomen is nondistended, soft and non-tender. Normal bowel sounds heard. Central nervous system: Alert and oriented. No focal neurological deficits.   Data Reviewed: I have personally reviewed following labs and imaging studies  CBC Lab Results  Component Value Date   WBC 12.0 (H) 12/13/2023   RBC 3.78 (L) 12/13/2023   HGB 11.8 (L) 12/13/2023   HCT 34.8 (L) 12/13/2023   MCV 92.1 12/13/2023   MCH 31.2 12/13/2023   PLT 368 12/13/2023   MCHC 33.9 12/13/2023   RDW 14.0 12/13/2023   LYMPHSABS 2.8 12/10/2023   MONOABS 1.4 (H) 12/10/2023   EOSABS 0.3 12/10/2023   BASOSABS 0.1 12/10/2023     Last metabolic panel Lab Results  Component Value Date   NA 142 12/12/2023   K 3.8 12/12/2023   CL 108 12/12/2023   CO2 12 (L) 12/12/2023   BUN <5 (L) 12/12/2023   CREATININE 0.71 12/12/2023   GLUCOSE 84 12/12/2023   GFRNONAA >60 12/12/2023   GFRAA >60 02/28/2019   CALCIUM  7.8 (L)  12/12/2023   PHOS 2.3 (L) 12/03/2023   PROT 4.5 (L) 12/10/2023   ALBUMIN  2.0 (L) 12/10/2023   LABGLOB 3.0 05/01/2021   AGRATIO 1.5 05/01/2021   BILITOT 0.4 12/10/2023   ALKPHOS 57 12/10/2023   AST 11 (L) 12/10/2023   ALT 11 12/10/2023   ANIONGAP 22 (H) 12/12/2023    GFR: Estimated Creatinine Clearance: 70.9 mL/min (by C-G formula based on SCr of 0.71 mg/dL).  Recent Results (from the past 240 hours)  Gastrointestinal Panel by PCR , Stool     Status: None   Collection Time: 12/10/23 12:57 AM   Specimen: Stool  Result Value Ref Range Status   Campylobacter species NOT DETECTED NOT DETECTED Final   Plesimonas shigelloides NOT DETECTED NOT DETECTED Final   Salmonella species NOT DETECTED NOT DETECTED Final   Yersinia enterocolitica NOT DETECTED NOT DETECTED Final   Vibrio species NOT DETECTED NOT DETECTED Final   Vibrio cholerae NOT DETECTED NOT DETECTED Final   Enteroaggregative E coli (EAEC) NOT DETECTED NOT DETECTED Final   Enteropathogenic E coli (EPEC) NOT DETECTED NOT DETECTED Final   Enterotoxigenic E coli (ETEC) NOT DETECTED NOT DETECTED Final   Shiga like toxin producing E coli (STEC) NOT DETECTED NOT DETECTED Final   Shigella/Enteroinvasive E coli (EIEC) NOT DETECTED NOT DETECTED Final   Cryptosporidium NOT DETECTED NOT DETECTED Final   Cyclospora cayetanensis NOT DETECTED NOT DETECTED Final   Entamoeba histolytica NOT DETECTED NOT DETECTED Final   Giardia lamblia NOT DETECTED NOT DETECTED Final   Adenovirus  F40/41 NOT DETECTED NOT DETECTED Final   Astrovirus NOT DETECTED NOT DETECTED Final   Norovirus GI/GII NOT DETECTED NOT DETECTED Final   Rotavirus A NOT DETECTED NOT DETECTED Final   Sapovirus (I, II, IV, and V) NOT DETECTED NOT DETECTED Final    Comment: Performed at [facility], [address] [place], [place] [address]  C Difficile Quick Screen w PCR reflex     Status: None   Collection Time: 12/10/23 12:57 AM   Specimen: Stool  Result Value Ref  Range Status   C Diff antigen NEGATIVE NEGATIVE Final   C Diff toxin NEGATIVE NEGATIVE Final   C Diff interpretation No C. difficile detected.  Final    Comment: Performed at [facility], [address], [place], [place] [address]  Culture, blood (single) w Reflex to ID Panel     Status: None (Preliminary result)   Collection Time: 12/10/23  9:05 AM   Specimen: BLOOD RIGHT HAND  Result Value Ref Range Status   Specimen Description BLOOD RIGHT HAND  Final   Special Requests   Final    BOTTLES DRAWN AEROBIC ONLY Blood Culture adequate volume   Culture   Final    NO GROWTH 2 DAYS Performed at [facility], [address] [place], [place] [address]    Report Status PENDING  Incomplete      Radiology Studies: DG Chest Port 1 View Result Date: 12/12/2023 EXAM: 1 VIEW(S) XRAY OF THE CHEST 12/12/2023 04:39:32 AM COMPARISON: 10/17/2023. CTA chest 12/10/2023. CLINICAL HISTORY: 58 year old female. Acute respiratory distress. Recently diagnosed acute pulmonary emboli. FINDINGS: LINES, TUBES AND DEVICES: Satisfactory enteric tube placement into the stomach with the side hole at the level of the gastric body. LUNGS AND PLEURA: New right perihilar opacity. Diffuse interstitial opacities. Blunting of costophrenic angles. Chronic appearing hyperinflation. Acute on chronic diffuse increased pulmonary interstitium. Trace new fluid in the right minor fissure. Colon emphysema demonstrated on recent CTA. No pneumothorax. 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 BONES AND SOFT TISSUES: No acute osseous abnormality. UPPER ABDOMEN: Negative visible bowel gas pattern. IMPRESSION: 1. Emphysema, hyperinflation with interval diffuse increased pulmonary interstitium and small volume pleural fluid. Favor acute pulmonary edema over viral/atypical respiratory infection. 2. Satisfactory enteric tube position in the stomach. Electronically signed by: Helayne Hurst MD  12/12/2023 05:01 AM EST RP Workstation: [id number]   DG Abd Portable 1V Result Date: 12/11/2023 EXAM: 1 VIEW XRAY OF THE ABDOMEN 12/11/2023 11:54:00 AM COMPARISON: 01/02/2024 CLINICAL HISTORY: 01250 Ileus (HCC) [id number] Ileus (HCC) [id number] FINDINGS: LINES, TUBES AND DEVICES: Enteric tube is in the stomach, advanced from prior study. BOWEL: Nonobstructive bowel gas pattern. Single mildly prominent right lower quadrant small bowel loop. SOFT TISSUES: No opaque urinary calculi. BONES: No acute osseous abnormality. IMPRESSION: 1. No bowel obstruction. 2. Enteric tube tip projects over the stomach, appropriately advanced from the prior study. Electronically signed by: Franky Crease MD 12/11/2023 04:43 PM EST RP Workstation: [id number]   VAS US  LOWER EXTREMITY VENOUS (DVT) Result Date: 12/11/2023  Lower Venous DVT Study Patient Name:  ZAIYAH SOTTILE  Date of Exam:   12/11/2023 Medical Rec #: [id number]          Accession #:    [id number] Date of Birth: 24-Oct-1965          Patient Gender: F Patient Age:   50 years Exam Location:  [facility] Procedure:      VAS US   LOWER EXTREMITY VENOUS (DVT) Referring Phys: MAXIMINO SHARPS --------------------------------------------------------------------------------  Indications: Swelling, and pulmonary embolism. Other Indications: History of DVT/PE on chronic anticoagulation. Risk Factors: Stroke, left adrenal mass, and recent small bowel obstruction with gangrenous bowel requiring exploratory laparotomy with partial bowel resection. Comparison Study: 10/17/23 - negative Performing Technologist: Ricka Sturdivant-Jones RDMS, RVT  Examination Guidelines: A complete evaluation includes B-mode imaging, spectral Doppler, color Doppler, and power Doppler as needed of all accessible portions of each vessel. Bilateral testing is considered an integral part of a complete examination. Limited examinations for reoccurring indications may be performed as noted. The reflux portion of the exam  is performed with the patient in reverse Trendelenburg.  +---------+---------------+---------+-----------+----------+--------------+ RIGHT    CompressibilityPhasicitySpontaneityPropertiesThrombus Aging +---------+---------------+---------+-----------+----------+--------------+ CFV      Partial        Yes      Yes                  Chronic        +---------+---------------+---------+-----------+----------+--------------+ SFJ      Full                                                        +---------+---------------+---------+-----------+----------+--------------+ FV Prox  None                                                        +---------+---------------+---------+-----------+----------+--------------+ FV Mid   None           No       No                                  +---------+---------------+---------+-----------+----------+--------------+ FV DistalNone                                                        +---------+---------------+---------+-----------+----------+--------------+ PFV      Full                                                        +---------+---------------+---------+-----------+----------+--------------+ POP      None           No       No                                  +---------+---------------+---------+-----------+----------+--------------+ PTV      Full                                                        +---------+---------------+---------+-----------+----------+--------------+ PERO  Full                                                        +---------+---------------+---------+-----------+----------+--------------+ EIV                     Yes      Yes                  Patent         +---------+---------------+---------+-----------+----------+--------------+ CIV                     Yes      Yes                  Patent          +---------+---------------+---------+-----------+----------+--------------+   +---------+---------------+---------+-----------+----------+--------------+ LEFT     CompressibilityPhasicitySpontaneityPropertiesThrombus Aging +---------+---------------+---------+-----------+----------+--------------+ CFV      Full           Yes      Yes                                 +---------+---------------+---------+-----------+----------+--------------+ SFJ      Full                                                        +---------+---------------+---------+-----------+----------+--------------+ FV Prox  Full                                                        +---------+---------------+---------+-----------+----------+--------------+ FV Mid   Full           Yes      Yes                                 +---------+---------------+---------+-----------+----------+--------------+ FV DistalFull                                                        +---------+---------------+---------+-----------+----------+--------------+ PFV      Full                                                        +---------+---------------+---------+-----------+----------+--------------+ POP      Full           Yes      Yes                                 +---------+---------------+---------+-----------+----------+--------------+ PTV      Full                                                        +---------+---------------+---------+-----------+----------+--------------+  PERO     Full                                                        +---------+---------------+---------+-----------+----------+--------------+ EIV                     Yes      Yes                  Patent         +---------+---------------+---------+-----------+----------+--------------+     Summary: RIGHT: - Findings consistent with acute deep vein thrombosis involving the right common femoral vein, right  femoral vein, and right popliteal vein.  - No cystic structure found in the popliteal fossa.  LEFT: - There is no evidence of deep vein thrombosis in the lower extremity.  - No cystic structure found in the popliteal fossa.  *See table(s) above for measurements and observations. Electronically signed by Debby Robertson on 12/11/2023 at 4:08:35 PM.    Final       LOS: 3 days    Elgin Lam, MD [facility] 12/13/2023, 8:26 AM   If 7PM-7AM, please contact night-coverage [email or web address]

## 2023-12-13 NOTE — Consult Note (Addendum)
 Consultation  Referring Provider: TR[facility]/ Nettey Primary Care Physician:  Pcp, No Primary Gastroenterologist:  mozelle Cedar remote  Reason for Consultation: Ileus, diffuse colitis on CT patient postop recent small bowel obstruction with segmental ischemia  HPI: [name] is a 58 y.o. female with history of diabetes mellitus, CVA, previous DVT/PE and depression.  Patient had admission in mid November with small bowel obstruction, and required exploratory lap on 11/28/2023 due to persistent small bowel obstruction with concern for ischemic segment of small bowel. At surgery she was found to have a single band causing bowel obstruction/internal hernia in the distal ileum and had a segment of dead small bowel resected. She recovered postoperatively and was discharged home on 12/03/2023.  She reports that she was not feeling well after discharge though was able to gradually progress her diet.  She then developed recurrent nausea vomiting and diarrhea which she says was small-volume nonbloody.  Unaware of any fever at home or rigors. She came to the emergency room on 12/09/2023 due to those symptoms and repeat CT of the abdomen and pelvis showed a possible filling defect left lower lobe pulmonary artery, diffuse wall thickening of the colon and mucosal hyperenhancement of the colon greatest about the sigmoid colon and rectum there is diffuse dilation of the small bowel with tapering at the small bowel anastomosis in the right lower quadrant this may represent an ileus or partial obstruction at the anastomosis, small volume of pelvic ascites   Labs on admit with WBC of 27.4/hemoglobin 12.6/hematocrit 39.3 Potassium 2.6/BUN 10/creatinine 0.75 GI path panel negative C. difficile quick screen negative  She was started on broad-spectrum antibiotics Blood cultures have been negative  CT angio of the chest 12-25 did show an acute segmental and subsegmental pulmonary emboli bilaterally in the  lower lobes and left upper lobe pulmonary arteries no evidence of right heart strain, moderate COPD.  Patient has been followed by surgery, she initially had an NG tube placed which has since been discontinued and she is now on clear liquids. She says that she feels much better over the last 2 days, she is not having any significant abdominal pain, nausea and vomiting have resolved, she is having to soft mushy bowel movements per day no overt blood.  She has been able to be up and ambulating. She feels that she could tolerate solid food and is anxious to go home.  She had colonoscopy in 2008/Dr. Cedar with finding of intermittent rectal bleeding felt secondary to internal hemorrhoids, she had one 3 mm polyp removed and was noted to have a few scattered diverticuli.  Patient is currently on IV heparin  Leukocytosis significantly improved, WBC down to 12 today    Past Medical History:  Diagnosis Date   Acid reflux    Adrenal benign tumor    Apparently enlarging and patient is supposed to have it taken out   Anxiety    Depression    Diabetes mellitus without complication (HCC)    DVT (deep vein thrombosis) in pregnancy    Hypertension    Nausea vomiting and diarrhea 03/24/2021   PE (pulmonary embolism)    Stroke [facility])     Past Surgical History:  Procedure Laterality Date   BOWEL RESECTION  11/28/2023   Procedure: Resection of small bowel;  Surgeon: Sebastian Moles, MD;  Location: [facility] OR;  Service: General;;   DEBRIDEMENT AND CLOSURE WOUND Right 03/04/2019   Procedure: 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;  Surgeon: Shari Easter, MD;  Location: [facility] OR;  Service: Orthopedics;  Laterality: Right;  needs 60 min   LAPAROTOMY N/A 11/28/2023   Procedure: LAPAROTOMY, EXPLORATORY;  Surgeon: Sebastian Moles, MD;  Location: [facility] OR;  Service: General;  Laterality: N/A;   LOOP RECORDER IMPLANT N/A 03/25/2014   Procedure: LOOP RECORDER IMPLANT;   Surgeon: Elspeth JAYSON Sage, MD;  Location: [facility] CATH LAB;  Service: Cardiovascular;  Laterality: N/A;   NO PAST SURGERIES     TEE WITHOUT CARDIOVERSION N/A 03/25/2014   Procedure: TRANSESOPHAGEAL ECHOCARDIOGRAM (TEE);  Surgeon: Vinie JAYSON Maxcy, MD;  Location: [facility] ENDOSCOPY;  Service: Cardiovascular;  Laterality: N/A;    Prior to Admission medications   Medication Sig Start Date End Date Taking? Authorizing Provider  acetaminophen  (TYLENOL ) 500 MG tablet Take 2 tablets (1,000 mg total) by mouth every 6 (six) hours as needed for mild pain (pain score 1-3). 12/03/23   Al-Sultani, Anmar, MD  ibuprofen  (ADVIL ) 400 MG tablet Take 1 tablet (400 mg total) by mouth every 6 (six) hours as needed for moderate pain (pain score 4-6). 12/03/23   Al-Sultani, Anmar, MD  metFORMIN  (GLUCOPHAGE ) 500 MG tablet Take 1 tablet (500 mg total) by mouth 2 (two) times daily with a meal. 12/03/23   Al-Sultani, Anmar, MD  methocarbamol  (ROBAXIN ) 750 MG tablet Take 750 mg by mouth 4 (four) times daily.    [provider]  Multiple Vitamin (MULTIVITAMIN WITH MINERALS) TABS tablet Take 1 tablet by mouth daily. 12/03/23   Al-Sultani, Anmar, MD  ondansetron  (ZOFRAN -ODT) 4 MG disintegrating tablet Take 1 tablet (4 mg total) by mouth every 8 (eight) hours as needed for nausea or vomiting. 12/06/23   Tegeler, Lonni PARAS, MD  oxycodone  (OXY-IR) 5 MG capsule Take 5 mg by mouth every 6 (six) hours as needed for pain.    [provider]  pantoprazole  (PROTONIX ) 40 MG tablet Take 1 tablet (40 mg total) by mouth at bedtime. 12/03/23 01/02/24  Al-Sultani, Anmar, MD  polyethylene glycol (MIRALAX  / GLYCOLAX ) 17 g packet Take 17 g by mouth daily as needed for mild constipation. 12/03/23   Al-Sultani, Anmar, MD    Current Facility-Administered Medications  Medication Dose Route Frequency Provider Last Rate Last Admin   acetaminophen  (TYLENOL ) tablet 650 mg  650 mg Oral Q6H PRN Howerter, Justin B, DO       Or   acetaminophen   (TYLENOL ) suppository 650 mg  650 mg Rectal Q6H PRN Howerter, Justin B, DO   650 mg at 12/11/23 0307   cefTRIAXone  (ROCEPHIN ) 2 g in sodium chloride  0.9 % 100 mL IVPB  2 g Intravenous Q24H Howerter, Justin B, DO 200 mL/hr at 12/12/23 2052 2 g at 12/12/23 2052   chewing gum (ORBIT) sugar free  1 Stick Oral PRN Tammy Sor, PA-C       cholestyramine  light (PREVALITE ) packet 4 g  4 g Oral BID Cornett, Debby, MD   4 g at 12/13/23 1209   feeding supplement (BOOST / RESOURCE BREEZE) liquid 1 Container  1 Container Oral TID BM Briana Elgin LABOR, MD       fentaNYL  (SUBLIMAZE ) injection 12.5 mcg  12.5 mcg Intravenous Q2H PRN Howerter, Justin B, DO   12.5 mcg at 12/13/23 1204   heparin  ADULT infusion 100 units/mL (25000 units/250mL)  1,450 Units/hr Intravenous Continuous Perri Olam POUR, RPH 14.5 mL/hr at 12/13/23 1159 1,450 Units/hr at 12/13/23 1159   insulin  aspart (novoLOG ) injection 0-6 Units  0-6 Units Subcutaneous Q6H Howerter, Justin B, DO  1 Units at 12/13/23 0032   metroNIDAZOLE  (FLAGYL ) IVPB 500 mg  500 mg Intravenous Q12H Howerter, Justin B, DO   Stopped at 12/13/23 9071   naloxone  (NARCAN ) injection 0.4 mg  0.4 mg Intravenous PRN Howerter, Justin B, DO       ondansetron  (ZOFRAN ) injection 4 mg  4 mg Intravenous Q6H PRN Howerter, Justin B, DO   4 mg at 12/12/23 9973   pantoprazole  (PROTONIX ) injection 40 mg  40 mg Intravenous Q12H Smith, Rondell A, MD   40 mg at 12/13/23 9185   potassium chloride  SA (KLOR-CON  M) CR tablet 40 mEq  40 mEq Oral Q4H Briana Elgin LABOR, MD       sodium bicarbonate  150 mEq in dextrose  5 % 1,150 mL infusion   Intravenous Continuous Briana Elgin LABOR, MD 50 mL/hr at 12/13/23 1159 Infusion Verify at 12/13/23 1159    Allergies as of 12/09/2023 - Review Complete 12/09/2023  Allergen Reaction Noted   Amoxicillin Hives 03/07/2011   Aspirin Hives 03/07/2011   Codeine Hives 03/07/2011   Penicillins Hives 03/07/2011    Family History  Problem Relation Age of Onset   Heart  attack Father 36    Social History   Socioeconomic History   Marital status: Married    Spouse name: Richard   Number of children: 2   Years of education: HS   Highest education level: Not on file  Occupational History    Employer: OTHER    Comment: n/a  Tobacco Use   Smoking status: Every Day    Current packs/day: 1.50    Average packs/day: 1.5 packs/day for 40.0 years (60.0 ttl pk-yrs)    Types: Cigarettes   Smokeless tobacco: Never  Vaping Use   Vaping status: Never Used  Substance and Sexual Activity   Alcohol use: No    Alcohol/week: 0.0 standard drinks of alcohol   Drug use: No   Sexual activity: Not on file  Other Topics Concern   Not on file  Social History Narrative   Patient lives at home with spouse.   Caffeine Use: 2 cups daily   Social Drivers of Health   Financial Resource Strain: Not on file  Food Insecurity: No Food Insecurity (12/11/2023)   Hunger Vital Sign    Worried About Running Out of Food in the Last Year: Never true    Ran Out of Food in the Last Year: Never true  Transportation Needs: No Transportation Needs (12/11/2023)   PRAPARE - Administrator, Civil Service (Medical): No    Lack of Transportation (Non-Medical): No  Physical Activity: Not on file  Stress: Not on file  Social Connections: Not on file  Intimate Partner Violence: Not At Risk (12/11/2023)   Humiliation, Afraid, Rape, and Kick questionnaire    Fear of Current or Ex-Partner: No    Emotionally Abused: No    Physically Abused: No    Sexually Abused: No    Review of Systems: Pertinent positive and negative review of systems were noted in the above HPI section.  All other review of systems was otherwise negative.   Physical Exam: Vital signs in last 24 hours: Temp:  [98.1 F (36.7 C)-98.6 F (37 C)] 98.5 F (36.9 C) (12/05 1159) Pulse Rate:  [86-104] 93 (12/05 1117) Resp:  [12-20] 15 (12/05 1117) BP: (80-113)/(52-84) 99/68 (12/05 1159) SpO2:  [97 %-100 %]  98 % (12/05 1117) Weight:  [63.6 kg] 63.6 kg (12/05 0603) Last BM Date : 12/13/23 General:  Alert,  Well-developed, well-nourished, pleasant and cooperative in NAD Head:  Normocephalic and atraumatic. Eyes:  Sclera clear, no icterus.   Conjunctiva pink. Ears:  Normal auditory acuity. Nose:  No deformity, discharge,  or lesions. Mouth:  No deformity or lesions.  Poor dentition Neck:  Supple; no masses or thyromegaly. Lungs:  Clear throughout to auscultation.   No wheezes, crackles, or rhonchi.  Heart:  Regular rate and rhythm; no murmurs, clicks, rubs,  or gallops. Abdomen:  Soft, bowel sounds are present, surgical wound is dressed she has mild tenderness bilaterally, no guarding or rebound, no tympany Rectal:  Deferred  Msk:  Symmetrical without gross deformities. . Pulses:  Normal pulses noted. Extremities:  Without clubbing or edema. Neurologic:  Alert and  oriented x4;  grossly normal neurologically. Skin:  Intact without significant lesions or rashes.. Psych:  Alert and cooperative. Normal mood and affect.  Intake/Output from previous day: 12/04 0701 - 12/05 0700 In: 2415.5 [P.O.:800; I.V.:1065.5; NG/GT:50; IV Piggyback:500] Out: 1250 [Urine:1250] Intake/Output this shift: Total I/O In: 809.7 [P.O.:240; I.V.:469.7; IV Piggyback:100] Out: -   Lab Results: Recent Labs    12/11/23 0331 12/12/23 0718 12/13/23 0255  WBC 15.6* 21.2* 12.0*  HGB 13.0 12.9 11.8*  HCT 38.7 38.6 34.8*  PLT 416* 405* 368   BMET Recent Labs    12/11/23 0934 12/11/23 1534 12/12/23 0337 12/13/23 0933  NA 140  --  142 142  K 2.7* 2.9* 3.8 2.4*  CL 106  --  108 104  CO2 20*  --  12* 29  GLUCOSE 84  --  84 142*  BUN <5*  --  <5* <5*  CREATININE 0.65  --  0.71 0.61  CALCIUM  7.2*  --  7.8* 7.4*   LFT No results for input(s): PROT, ALBUMIN , AST, ALT, ALKPHOS, BILITOT, BILIDIR, IBILI in the last 72 hours. PT/INR No results for input(s): LABPROT, INR in the last 72  hours. Hepatitis Panel No results for input(s): HEPBSAG, HCVAB, HEPAIGM, HEPBIGM in the last 72 hours.    IMPRESSION:  #23 58 year old female status post exploratory lap 11/28/2023 for small bowel obstruction with concern for gangrenous bowel.  At surgery found to have a single band/internal hernia causing the obstruction and a dead segment of small bowel was resected. Discharged home 12/03/2023.  Developed increasing abdominal pain, distention, nausea vomiting and small-volume diarrhea precipitating return to the ER on 12/10/2023  Workup with CT food diffuse wall thickening of the colon greatest in the sigmoid and rectum and dilation of the small bowel tapering at the anastomosis with concern for ileus versus partial obstruction at the anastomosis.  She did have significant leukocytosis, and significant hypokalemia.  He has been on broad-spectrum antibiotics, infectious workup otherwise negative She has had significant improvement over the past couple of days, NG tube out and tolerating liquids.  She feels much better overall.  Etiology of the colonic wall thickening/colitis not entirely clear, no infectious etiology identified though certainly may have been infectious.  If she had a partial small bowel obstruction at her anastomosis that seems to have opened up.  #2 new DVT/bilateral subsegmental DVTs with no right heart strain-currently on heparin  3 history of hypercoagulable state with prior DVT/PE and prior CVA 4 COPD  Plan; advance to soft diet as tolerates Would complete a 5-day course of IV antibiotics Colonoscopy not indicated at present If she continues to improve and can tolerate p.o.'s she may be able to be discharged this weekend from GI perspective. We can arrange  for outpatient follow-up at our office to assure that she does not have any persistent symptoms suggestive of colitis.      Amy EsterwoodPA-C  12/13/2023, 12:48 PM   Attending physician's note  I  personally saw the patient and performed a substantive portion of the medical decision making process for this encounter (including a complete performance of the key components : MDM, Hx and Exam), in conjunction with the APP.  I agree with the APP's note, impression, and  the management plan for the number and complexity of problems addressed at the encounter for the patient and take responsibility for that plan with its inherent risk of complications, morbidity, or mortality with additional input as follows.    58 year old female with history of CVA, DVT, PE, diabetes s/p ex lap on 11/28/2023 due to persistent small bowel obstruction Postop ileus and diarrhea GI path panel and C. difficile negative  On exam abdomen is soft, midline incision, no significant tenderness.  On average she is having 3-4 soft semiformed bowel movements.  Denies liquid watery diarrhea. Continue current course of antibiotics Continue diet as tolerated  Will add Lomotil  1 tab twice daily, hold it if having constipation  Patient would like to go home soon.  Will defer to primary team  No plan for colonoscopy or endoscopic evaluation at this point    The patient was provided an opportunity to ask questions and all were answered. The patient agreed with the plan and demonstrated an understanding of the instructions.  LOIS Wilkie Mcgee , MD [phone]

## 2023-12-14 DIAGNOSIS — R197 Diarrhea, unspecified: Secondary | ICD-10-CM | POA: Diagnosis not present

## 2023-12-14 DIAGNOSIS — K9189 Other postprocedural complications and disorders of digestive system: Secondary | ICD-10-CM | POA: Diagnosis not present

## 2023-12-14 DIAGNOSIS — K567 Ileus, unspecified: Secondary | ICD-10-CM | POA: Diagnosis not present

## 2023-12-14 DIAGNOSIS — I2699 Other pulmonary embolism without acute cor pulmonale: Secondary | ICD-10-CM | POA: Diagnosis not present

## 2023-12-14 DIAGNOSIS — J9601 Acute respiratory failure with hypoxia: Secondary | ICD-10-CM | POA: Diagnosis not present

## 2023-12-14 DIAGNOSIS — K56600 Partial intestinal obstruction, unspecified as to cause: Secondary | ICD-10-CM | POA: Diagnosis not present

## 2023-12-14 LAB — BASIC METABOLIC PANEL WITH GFR
Anion gap: 9 (ref 5–15)
BUN: <5 mg/dL — ABNORMAL LOW (ref 6–20)
CO2: 27 mmol/L (ref 22–32)
Calcium: 7.2 mg/dL — ABNORMAL LOW (ref 8.9–10.3)
Chloride: 104 mmol/L (ref 98–111)
Creatinine, Ser: 0.72 mg/dL (ref 0.44–1.00)
GFR, Estimated: >60 mL/min (ref >60)
Glucose, Bld: 122 mg/dL — ABNORMAL HIGH (ref 70–99)
Potassium: 2.7 mmol/L — CL (ref 3.5–5.1)
Sodium: 140 mmol/L (ref 135–145)

## 2023-12-14 LAB — GLUCOSE, CAPILLARY
Glucose-Capillary: 122 mg/dL — ABNORMAL HIGH (ref 70–99)
Glucose-Capillary: 126 mg/dL — ABNORMAL HIGH (ref 70–99)
Glucose-Capillary: 141 mg/dL — ABNORMAL HIGH (ref 70–99)
Glucose-Capillary: 155 mg/dL — ABNORMAL HIGH (ref 70–99)
Glucose-Capillary: 155 mg/dL — ABNORMAL HIGH (ref 70–99)

## 2023-12-14 LAB — CBC
HCT: 33.8 % — ABNORMAL LOW (ref 36.0–46.0)
Hemoglobin: 11.3 g/dL — ABNORMAL LOW (ref 12.0–15.0)
MCH: 31 pg (ref 26.0–34.0)
MCHC: 33.4 g/dL (ref 30.0–36.0)
MCV: 92.6 fL (ref 80.0–100.0)
Platelets: 296 K/uL (ref 150–400)
RBC: 3.65 MIL/uL — ABNORMAL LOW (ref 3.87–5.11)
RDW: 13.9 % (ref 11.5–15.5)
WBC: 9.1 K/uL (ref 4.0–10.5)
nRBC: 0 % (ref 0.0–0.2)

## 2023-12-14 LAB — ALBUMIN: Albumin: 1.9 g/dL — ABNORMAL LOW (ref 3.5–5.0)

## 2023-12-14 LAB — MAGNESIUM
Magnesium: 1.3 mg/dL — ABNORMAL LOW (ref 1.7–2.4)
Magnesium: 2.2 mg/dL (ref 1.7–2.4)

## 2023-12-14 LAB — POTASSIUM: Potassium: 4.1 mmol/L (ref 3.5–5.1)

## 2023-12-14 LAB — HEPARIN LEVEL (UNFRACTIONATED): Heparin Unfractionated: 0.53 [IU]/mL (ref 0.30–0.70)

## 2023-12-14 MED ORDER — POTASSIUM CHLORIDE 10 MEQ/100ML IV SOLN: 10.0000 meq | INTRAVENOUS | Status: DC

## 2023-12-14 MED ORDER — MAGNESIUM SULFATE 4 GM/100ML IV SOLN
4.0000 g | Freq: Once | INTRAVENOUS | Status: AC
  Administered 2023-12-14: 4 g via INTRAVENOUS
  Filled 2023-12-14: qty 100

## 2023-12-14 MED ORDER — POTASSIUM CHLORIDE CRYS ER 20 MEQ PO TBCR
40.0000 meq | EXTENDED_RELEASE_TABLET | ORAL | Status: AC
  Administered 2023-12-14 (×2): 40 meq via ORAL
  Filled 2023-12-14 (×2): qty 2

## 2023-12-14 MED ORDER — POTASSIUM CHLORIDE CRYS ER 20 MEQ PO TBCR: 40.0000 meq | EXTENDED_RELEASE_TABLET | Freq: Once | ORAL | Status: DC

## 2023-12-14 MED ORDER — SODIUM CHLORIDE 0.9 % IV BOLUS
500.0000 mL | Freq: Once | INTRAVENOUS | Status: AC
  Administered 2023-12-14: 500 mL via INTRAVENOUS

## 2023-12-14 MED ORDER — OXYCODONE HCL 5 MG PO TABS
5.0000 mg | ORAL_TABLET | ORAL | Status: DC | PRN
  Administered 2023-12-14 – 2023-12-15 (×4): 10 mg via ORAL
  Filled 2023-12-14 (×4): qty 2

## 2023-12-14 NOTE — Progress Notes (Addendum)
 PHARMACY - ANTICOAGULATION CONSULT NOTE  Pharmacy Consult for Heparin   Indication: pulmonary embolus  Allergies  Allergen Reactions   Amoxicillin Hives   Aspirin Hives   Codeine Hives   Penicillins Hives    Vital Signs: Temp: 98.2 F (36.8 C) (12/06 0504) Temp Source: Oral (12/06 0504) BP: 85/55 (12/06 0506) Pulse Rate: 103 (12/06 0504)  Labs: Recent Labs    12/12/23 0337 12/12/23 0718 12/12/23 0718 12/13/23 0255 12/13/23 0933 12/14/23 0316  HGB  --  12.9   < > 11.8*  --  11.3*  HCT  --  38.6  --  34.8*  --  33.8*  PLT  --  405*  --  368  --  296  HEPARINUNFRC 0.32  --   --  0.44  --  0.53  CREATININE 0.71  --   --   --  0.61 0.72   < > = values in this interval not displayed.    Estimated Creatinine Clearance: 70.9 mL/min (by C-G formula based on SCr of 0.72 mg/dL).   Medical History: Past Medical History:  Diagnosis Date   Acid reflux    Adrenal benign tumor    Apparently enlarging and patient is supposed to have it taken out   Anxiety    Depression    Diabetes mellitus without complication (HCC)    DVT (deep vein thrombosis) in pregnancy    Hypertension    Nausea vomiting and diarrhea 03/24/2021   PE (pulmonary embolism)    Stroke [facility])      Assessment: 58 y/o F 10 days post-op from small bowel resection presents to the ED with increased N/V and surgical site drainage. CT abdomen revealed a possible RLL pulmonary artery filling defect. A CT Angio study was then completed which showed bilateral PE. Start heparin . Labs above reviewed.  Heparin  is therapeutic this morning at 0.53. No issues with infusion or bleeding reported.  Goal of Therapy:  Heparin  level 0.3-0.7 units/ml Monitor platelets by anticoagulation protocol: Yes   Plan:  Continue heparin  at 1450 units / hr Daily CBC/Heparin  level Monitor for bleeding F/u transition back to Eliquis    Thank you for allowing pharmacy to be a part of this patient's care.   Bascom JAYSON Louder,  PharmD 12/14/2023 9:54 AM  **Pharmacist phone directory can be found on [email or web address] listed under [facility]**

## 2023-12-14 NOTE — Progress Notes (Signed)
 Patient with critical potassium is refusing ordered IV potassium because she cannot tolerate the burning it produces while infusing. On-call physician notified.

## 2023-12-14 NOTE — Progress Notes (Signed)
 [place] ROUNDING NOTE   Subjective: Feeling better, decreased frequency of bowel movements   Objective: Vital signs in last 24 hours: Temp:  [97.4 F (36.3 C)-98.5 F (36.9 C)] 98.2 F (36.8 C) (12/06 0504) Pulse Rate:  [99-103] 103 (12/06 0504) Resp:  [13-18] 17 (12/06 0506) BP: (85-111)/(55-70) 85/55 (12/06 0506) SpO2:  [97 %-99 %] 99 % (12/06 0504) Weight:  [64.4 kg] 64.4 kg (12/06 0506) Last BM Date : 12/13/23 General: NAD Abdomen: Soft, no tenderness, midline incision     Intake/Output from previous day: 12/05 0701 - 12/06 0700 In: 809.7 [P.O.:240; I.V.:469.7; IV Piggyback:100] Out: -  Intake/Output this shift: No intake/output data recorded.   Lab Results: Recent Labs    12/12/23 0718 12/13/23 0255 12/14/23 0316  WBC 21.2* 12.0* 9.1  HGB 12.9 11.8* 11.3*  PLT 405* 368 296  MCV 94.1 92.1 92.6   BMET Recent Labs    12/12/23 0337 12/13/23 0933 12/13/23 1929 12/14/23 0316  NA 142 142  --  140  K 3.8 2.4* 2.8* 2.7*  CL 108 104  --  104  CO2 12* 29  --  27  GLUCOSE 84 142*  --  122*  BUN <5* <5*  --  <5*  CREATININE 0.71 0.61  --  0.72  CALCIUM  7.8* 7.4*  --  7.2*   LFT No results for input(s): PROT, ALBUMIN , AST, ALT, ALKPHOS, BILITOT, BILIDIR, IBILI in the last 72 hours. PT/INR No results for input(s): INR in the last 72 hours.    Imaging/Other results: No results found.    Assessment &Plan  58 year old female with history of CVA, DVT, PE, diabetes s/p ex lap on 11/28/2023 due to persistent small bowel obstruction Postop ileus and diarrhea GI path panel and C. difficile negative   Continue Lomotil  1 tab twice daily, hold it if having constipation   Overall symptoms improving. GI signing off, please call with any questions   I have spent >35 minutes of patient care (this includes precharting, chart review, review of results, face-to-face time used for counseling as well as treatment plan and  follow-up. The patient was provided an opportunity to ask questions and all were answered. The patient agreed with the plan and demonstrated an understanding of the instructions.    LOIS Wilkie Mcgee , MD [phone]  [facility]

## 2023-12-14 NOTE — Progress Notes (Signed)
 OT Cancellation Note  Patient Details Name: [name] MRN: [id number] DOB: Sep 19, 1965   Cancelled Treatment:    Reason Eval/Treat Not Completed: OT screened, no needs identified, will sign off (Discussed with PT.)  [facility] 12/14/2023, 6:33 PM Kreg Sink, OT/L   Acute OT Clinical Specialist Acute Rehabilitation Services Pager [phone] Office [phone]

## 2023-12-14 NOTE — Evaluation (Signed)
 Physical Therapy Evaluation Patient Details Name: [name] MRN: [id number] DOB: 08-Aug-1965 Today's Date: 12/14/2023  History of Present Illness  58 y.o. F adm 12/1 for  ileus and pancolitis. Recent admission 11/21 for exp lap with small bowel resection. PMH: recurrent PE, CVA with Rt sided deficits, DVT, HTN  Clinical Impression  Pt admitted with above diagnosis. Feels close to baseline again aside from some soreness and fatigue. Able to ambulate 160 with supervision using a RW for support. Minor drift/instability noted but able to correct and did not demonstrate any buckling or overt LOB. Mod I with bed mobility, supervision with transfers. Encouraged OOB often with staff to mobilize. Would benefit from OPPT follow-up once cleared by surgeon. Pt currently with functional limitations due to the deficits listed below (see PT Problem List). Pt will benefit from acute skilled PT to increase their independence and safety with mobility to allow discharge.           If plan is discharge home, recommend the following: Assist for transportation;Help with stairs or ramp for entrance;A little help with bathing/dressing/bathroom;Assistance with cooking/housework   Can travel by private vehicle        Equipment Recommendations None recommended by PT  Recommendations for Other Services       Functional Status Assessment Patient has had a recent decline in their functional status and demonstrates the ability to make significant improvements in function in a reasonable and predictable amount of time.     Precautions / Restrictions Precautions Precautions: Fall Recall of Precautions/Restrictions: Intact Restrictions Weight Bearing Restrictions Per Provider Order: No      Mobility  Bed Mobility Overal bed mobility: Modified Independent             General bed mobility comments: Extra time in/out of bed, no assist.    Transfers Overall transfer level: Needs assistance Equipment  used: Rolling walker (2 wheels) Transfers: Sit to/from Stand Sit to Stand: Supervision           General transfer comment: Supervision for safety, no physical assist to rise. Cues for hand placement. Steady with RW upon rising.    Ambulation/Gait Ambulation/Gait assistance: Supervision Gait Distance (Feet): 160 Feet Assistive device: Rolling walker (2 wheels) Gait Pattern/deviations: Trunk flexed, Step-to pattern, Decreased stride length, Wide base of support, Drifts right/left Gait velocity: dec Gait velocity interpretation: <1.8 ft/sec, indicate of risk for recurrent falls   General Gait Details: Minor drift but self corrects with cues early on for awareness. No overt buckling or LOB but reliant on RW for support. Educated on safety and use. Feels confident with device.  Stairs            Wheelchair Mobility     Tilt Bed    Modified Rankin (Stroke Patients Only)       Balance Overall balance assessment: Mild deficits observed, not formally tested                                           Pertinent Vitals/Pain Pain Assessment Pain Assessment: Faces Faces Pain Scale: Hurts a little bit Pain Location: abdomen Pain Descriptors / Indicators: Sore Pain Intervention(s): Limited activity within patient's tolerance, Monitored during session, Repositioned    Home Living Family/patient expects to be discharged to:: Private residence Living Arrangements: Children;Other relatives Available Help at Discharge: Family;Available 24 hours/day Type of Home: House Home Access: Stairs to enter  Entrance Stairs-Rails: None Entrance Stairs-Number of Steps: 2   Home Layout: One level Home Equipment: Agricultural Consultant (2 wheels);Rollator (4 wheels);Wheelchair - power;BSC/3in1      Prior Function Prior Level of Function : Needs assist             Mobility Comments: uses RW; assist on steps into home one person on each side Denies falls ADLs Comments:  States she has been able to bath/dress herself, denies falls, Uses equipment as needed.     Extremity/Trunk Assessment   Upper Extremity Assessment Upper Extremity Assessment: Defer to OT evaluation    Lower Extremity Assessment Lower Extremity Assessment: Generalized weakness    Cervical / Trunk Assessment Cervical / Trunk Assessment: Kyphotic  Communication   Communication Communication: No apparent difficulties    Cognition Arousal: Alert Behavior During Therapy: WFL for tasks assessed/performed   PT - Cognitive impairments: No apparent impairments                         Following commands: Intact       Cueing Cueing Techniques: Verbal cues     General Comments General comments (skin integrity, edema, etc.): HR up to 106 at max. On RA throughout visit, unable to obtain pleth, no dyspnea.    Exercises     Assessment/Plan    PT Assessment Patient needs continued PT services  PT Problem List Decreased activity tolerance;Decreased balance;Decreased mobility;Pain;Decreased strength;Decreased knowledge of use of DME       PT Treatment Interventions DME instruction;Gait training;Stair training;Functional mobility training;Therapeutic activities;Balance training;Patient/family education;Therapeutic exercise;Neuromuscular re-education    PT Goals (Current goals can be found in the Care Plan section)  Acute Rehab PT Goals Patient Stated Goal: return home when medically ready PT Goal Formulation: With patient Time For Goal Achievement: 12/28/23 Potential to Achieve Goals: Good    Frequency Min 2X/week     Co-evaluation               AM-PAC PT 6 Clicks Mobility  Outcome Measure Help needed turning from your back to your side while in a flat bed without using bedrails?: None Help needed moving from lying on your back to sitting on the side of a flat bed without using bedrails?: None Help needed moving to and from a bed to a chair (including a  wheelchair)?: A Little Help needed standing up from a chair using your arms (e.g., wheelchair or bedside chair)?: A Little Help needed to walk in hospital room?: A Little Help needed climbing 3-5 steps with a railing? : A Little 6 Click Score: 20    End of Session Equipment Utilized During Treatment: Gait belt Activity Tolerance: Patient tolerated treatment well Patient left: with call bell/phone within reach;in bed;with bed alarm set   PT Visit Diagnosis: Other abnormalities of gait and mobility (R26.89);Unsteadiness on feet (R26.81);Muscle weakness (generalized) (M62.81);Difficulty in walking, not elsewhere classified (R26.2)    Time: 8498-8482 PT Time Calculation (min) (ACUTE ONLY): 16 min   Charges:   PT Evaluation $PT Eval Low Complexity: 1 Low   PT General Charges $$ ACUTE PT VISIT: 1 Visit         Leontine Roads, PT, DPT [facility]  Rehabilitation Services Physical Therapist Office: [phone] Website: [email or web address]   Leontine GORMAN Roads 12/14/2023, 4:01 PM

## 2023-12-14 NOTE — Progress Notes (Signed)
 Subjective: Patient reports overall abdominal pain is better. She is tolerating soft diet and reports stools are improving   Objective: Vital signs in last 24 hours: Temp:  [97.4 F (36.3 C)-98.5 F (36.9 C)] 98.2 F (36.8 C) (12/06 0504) Pulse Rate:  [93-103] 103 (12/06 0504) Resp:  [13-18] 17 (12/06 0506) BP: (80-111)/(52-70) 85/55 (12/06 0506) SpO2:  [97 %-99 %] 99 % (12/06 0504) Weight:  [64.4 kg] 64.4 kg (12/06 0506) Last BM Date : 12/13/23  Intake/Output from previous day: 12/05 0701 - 12/06 0700 In: 809.7 [P.O.:240; I.V.:469.7; IV Piggyback:100] Out: -  Intake/Output this shift: No intake/output data recorded.  PE: Gen: chronically unwell appearing female, NAD currently lying in bed Heart: RRR Lungs: normal effort Abd: soft,appropriately ttp, ND, midline wound base is pale with some fibrinous exudate, but no overt infection. Fascia is intact.  Some diffuse tenderness, but no guarding/peritonitis.  Lab Results:  Recent Labs    12/13/23 0255 12/14/23 0316  WBC 12.0* 9.1  HGB 11.8* 11.3*  HCT 34.8* 33.8*  PLT 368 296   BMET Recent Labs    12/13/23 0933 12/13/23 1929 12/14/23 0316  NA 142  --  140  K 2.4* 2.8* 2.7*  CL 104  --  104  CO2 29  --  27  GLUCOSE 142*  --  122*  BUN <5*  --  <5*  CREATININE 0.61  --  0.72  CALCIUM  7.4*  --  7.2*   PT/INR No results for input(s): LABPROT, INR in the last 72 hours. CMP     Component Value Date/Time   NA 140 12/14/2023 0316   NA 134 05/01/2021 1532   K 2.7 (LL) 12/14/2023 0316   CL 104 12/14/2023 0316   CO2 27 12/14/2023 0316   GLUCOSE 122 (H) 12/14/2023 0316   BUN <5 (L) 12/14/2023 0316   BUN 8 05/01/2021 1532   CREATININE 0.72 12/14/2023 0316   CALCIUM  7.2 (L) 12/14/2023 0316   PROT 4.5 (L) 12/10/2023 0913   PROT 7.4 05/01/2021 1532   ALBUMIN  2.0 (L) 12/10/2023 0913   ALBUMIN  4.4 05/01/2021 1532   AST 11 (L) 12/10/2023 0913   ALT 11 12/10/2023 0913   ALKPHOS 57 12/10/2023 0913    BILITOT 0.4 12/10/2023 0913   BILITOT 0.3 05/01/2021 1532   GFRNONAA >60 12/14/2023 0316   GFRAA >60 02/28/2019 2041   Lipase     Component Value Date/Time   LIPASE 21 12/09/2023 2243       Studies/Results: No results found.   Anti-infectives: Anti-infectives (From admission, onward)    Start     Dose/Rate Route Frequency Ordered Stop   12/10/23 2200  cefTRIAXone  (ROCEPHIN ) 2 g in sodium chloride  0.9 % 100 mL IVPB        2 g 200 mL/hr over 30 Minutes Intravenous Every 24 hours 12/10/23 0303     12/10/23 1000  metroNIDAZOLE  (FLAGYL ) IVPB 500 mg        500 mg 100 mL/hr over 60 Minutes Intravenous Every 12 hours 12/10/23 0303     12/10/23 0100  cefTRIAXone  (ROCEPHIN ) 2 g in sodium chloride  0.9 % 100 mL IVPB        2 g 200 mL/hr over 30 Minutes Intravenous  Once 12/10/23 0058 12/10/23 0154   12/10/23 0100  metroNIDAZOLE  (FLAGYL ) IVPB 500 mg        500 mg 100 mL/hr over 60 Minutes Intravenous  Once 12/10/23 0058 12/10/23 0302  Assessment/Plan S/p ex lap with SBR for internal hernia with ischemic bowel by Dr. Sebastian on 11/21 now with ileus and pancolitis - C.diff and GI panel negative  - GI consulted yesterday - agree with completing course of abx for pancolitis  - diet as tolerated - continue BID WTD to midline  - ok to DC home from surgery perspective once medically cleared   FEN - soft diet VTE - heparin  gtt - ok to transition to DOAC ID - Rocephin /Flagyl   --per TRH-- B PEs - on heparin  per medicine DM H/o CVA HTN   LOS: 4 days    Burnard JONELLE Louder , [facility] 12/14/2023, 10:22 AM Please see Amion for pager number during day hours 7:00am-4:30pm or 7:00am -11:30am on weekends

## 2023-12-14 NOTE — Plan of Care (Signed)
  Problem: Education: Goal: Ability to describe self-care measures that may prevent or decrease complications (Diabetes Survival Skills Education) will improve Outcome: Progressing   Problem: Coping: Goal: Ability to adjust to condition or change in health will improve Outcome: Progressing   Problem: Health Behavior/Discharge Planning: Goal: Ability to manage health-related needs will improve Outcome: Progressing   Problem: Metabolic: Goal: Ability to maintain appropriate glucose levels will improve Outcome: Progressing   Problem: Nutritional: Goal: Maintenance of adequate nutrition will improve Outcome: Progressing   Problem: Tissue Perfusion: Goal: Adequacy of tissue perfusion will improve Outcome: Progressing   Problem: Education: Goal: Knowledge of General Education information will improve Description: Including pain rating scale, medication(s)/side effects and non-pharmacologic comfort measures Outcome: Progressing   Problem: Health Behavior/Discharge Planning: Goal: Ability to manage health-related needs will improve Outcome: Progressing   Problem: Clinical Measurements: Goal: Will remain free from infection Outcome: Progressing Goal: Respiratory complications will improve Outcome: Progressing   Problem: Activity: Goal: Risk for activity intolerance will decrease Outcome: Progressing

## 2023-12-14 NOTE — Progress Notes (Signed)
 PROGRESS NOTE    [name]  FMW:[id number] DOB: [birth year] DOA: 12/09/2023 PCP: Freddrick, No   Brief Narrative: [name] is a 58 y.o. female with a history of hypertension, diabetes mellitus type 2, DVT, PE, left adrenal mass, small bowel obstruction status post partial bowel resection.  Patient presented secondary to abdominal pain, diarrhea, vomiting with concern for obstruction and found to have evidence of ileus in addition to pancolitis concern for infection.  Patient met sepsis criteria on admission.  General surgery consulted.  She was started empirically on antibiotics and an NG tube was placed.   Assessment and Plan:  Severe sepsis Present on admission.  Source is GI with evidence of colitis on CT imaging.  Patient started empirically on ceftriaxone  and Flagyl .  GI panel and C. difficile negative.  Blood culture obtained on admission is no growth to date. Leukocytosis improved to 12,000. Afebrile. - Continue to follow-up blood culture data - Continue antibiotics - Daily CBC  Pancolitis CT imaging on admission significant for evidence of pancolitis greatest about the sigmoid colon and rectum.  Patient started empirically on ceftriaxone  and Flagyl .  GI pathogen panel and C. difficile testing negative. WBC shows good response to current antibiotic regimen. GI consulted and recommended no change in management. - Continue ceftriaxone  and Flagyl   Ileus Likely related to recent abdominal surgery.  Diagnosed on CT imaging.  General surgery consulted.  NG tube placed on 12/2 for management and removed on 12/5. Diet advancing. - General Surge recommendations: soft diet  Acute recurrent pulmonary embolism Diagnosed on admission via CTA chest which was significant for an acute segmental and subsegmental pulm emboli in the bilateral lower lobes and left upper lobe pulmonary arteries and without evidence of right heart strain.  Hemodynamically stable.  Patient started on heparin   IV for management. - Continue heparin  IV with plans to transition to Eliquis  prior to discharge  Acute respiratory failure with hypoxia Patient appears to have developed acute pulmonary edema of unclear etiology. Could possibly also have had an aspiration event. Patient with hypoxia and respiratory distress requiring application of supplemental oxygen on 12/4. Chest x-ray revealed concern for possible pulmonary edema and BNP elevated at 721.9. Transthoracic Echocardiogram this admission shows normal LV and RV function. No associated arrhythmia noted. Appears to be improved with Lasix . Oxygen weaned to room air.  Metabolic acidosis Likely related to severe hypoxia overnight. CO2 down to 12. - Discontinue D5 sodium bicarbonate  infusion  Acute DVT Lower extremity venous duplex scan significant for an acute DVT involving the common femoral vein, right femoral vein and right popliteal vein. Likely provoked, related to recent surgery. - Continue anticoagulation and plan to transition to Eliquis  for discharge  Hypokalemia Recurrent. Likely related to GI losses. Complicated by associated hypomagnesemia - Continue potassium supplementation - Recheck BMP in AM  Hypocalcemia Corrected calcium  from 12/2 of about 8.4.  Hypomagnesemia Patient given magnesium  supplementation. - Continue magnesium  supplementation - Recheck magnesium  in AM  Nonsustained ventricular tachycardia Noted. Possibly related to electrolyte imbalances. Patient started on metoprolol  which is now discontinued. - Manage electrolytes  Diabetes mellitus type 2 Uncontrolled with hyperglycemia based on hemoglobin A1c of 9.7%. patient started on SSI. - Continue SSI  History of CVA Noted.  No residual symptoms.  He does not appear patient is on medication management.  Left adrenal mass Noted on CT imaging.  Recommendation for surgical consultation and medical workup. Paitent has already been referred to endocrinology.  Tobacco  abuse Patient declined nicotine   patch.    DVT prophylaxis: Heparin  IV Code Status:   Code Status: Limited: Do not attempt resuscitation (DNR) -DNR-LIMITED -Do Not Intubate/DNI  Family Communication: None at bedside Disposition Plan: Discharge home once electrolytes stable. Anticipate discharge home in 24 hours.   Consultants:  General surgery Gastroenterology  Procedures:  None  Antimicrobials: Ceftriaxone  Flagyl    Subjective: Feeling a bit better. No specific concerns. Hoping to discharge home.  Objective: BP (!) 85/55   Pulse (!) 103   Temp 98.2 F (36.8 C) (Oral)   Resp 17   Ht 5' 6 (1.676 m)   Wt 64.4 kg   LMP  (LMP Unknown) Comment: a few months  SpO2 99%   BMI 22.92 kg/m   Examination:  General exam: Appears comfortable. Respiratory system: Clear. Respiratory effort normal. Cardiovascular system: S1 & S2 heard, RRR. Gastrointestinal system: Abdomen is nondistended, soft and non-tender. Normal bowel sounds heard. Central nervous system: Alert and oriented. No focal neurological deficits.   Data Reviewed: I have personally reviewed following labs and imaging studies  CBC Lab Results  Component Value Date   WBC 9.1 12/14/2023   RBC 3.65 (L) 12/14/2023   HGB 11.3 (L) 12/14/2023   HCT 33.8 (L) 12/14/2023   MCV 92.6 12/14/2023   MCH 31.0 12/14/2023   PLT 296 12/14/2023   MCHC 33.4 12/14/2023   RDW 13.9 12/14/2023   LYMPHSABS 2.8 12/10/2023   MONOABS 1.4 (H) 12/10/2023   EOSABS 0.3 12/10/2023   BASOSABS 0.1 12/10/2023     Last metabolic panel Lab Results  Component Value Date   NA 140 12/14/2023   K 2.7 (LL) 12/14/2023   CL 104 12/14/2023   CO2 27 12/14/2023   BUN <5 (L) 12/14/2023   CREATININE 0.72 12/14/2023   GLUCOSE 122 (H) 12/14/2023   GFRNONAA >60 12/14/2023   GFRAA >60 02/28/2019   CALCIUM  7.2 (L) 12/14/2023   PHOS 2.3 (L) 12/03/2023   PROT 4.5 (L) 12/10/2023   ALBUMIN  2.0 (L) 12/10/2023   LABGLOB 3.0 05/01/2021   AGRATIO  1.5 05/01/2021   BILITOT 0.4 12/10/2023   ALKPHOS 57 12/10/2023   AST 11 (L) 12/10/2023   ALT 11 12/10/2023   ANIONGAP 9 12/14/2023    GFR: Estimated Creatinine Clearance: 70.9 mL/min (by C-G formula based on SCr of 0.72 mg/dL).  Recent Results (from the past 240 hours)  Gastrointestinal Panel by PCR , Stool     Status: None   Collection Time: 12/10/23 12:57 AM   Specimen: Stool  Result Value Ref Range Status   Campylobacter species NOT DETECTED NOT DETECTED Final   Plesimonas shigelloides NOT DETECTED NOT DETECTED Final   Salmonella species NOT DETECTED NOT DETECTED Final   Yersinia enterocolitica NOT DETECTED NOT DETECTED Final   Vibrio species NOT DETECTED NOT DETECTED Final   Vibrio cholerae NOT DETECTED NOT DETECTED Final   Enteroaggregative E coli (EAEC) NOT DETECTED NOT DETECTED Final   Enteropathogenic E coli (EPEC) NOT DETECTED NOT DETECTED Final   Enterotoxigenic E coli (ETEC) NOT DETECTED NOT DETECTED Final   Shiga like toxin producing E coli (STEC) NOT DETECTED NOT DETECTED Final   Shigella/Enteroinvasive E coli (EIEC) NOT DETECTED NOT DETECTED Final   Cryptosporidium NOT DETECTED NOT DETECTED Final   Cyclospora cayetanensis NOT DETECTED NOT DETECTED Final   Entamoeba histolytica NOT DETECTED NOT DETECTED Final   Giardia lamblia NOT DETECTED NOT DETECTED Final   Adenovirus F40/41 NOT DETECTED NOT DETECTED Final   Astrovirus NOT DETECTED NOT DETECTED Final  Norovirus GI/GII NOT DETECTED NOT DETECTED Final   Rotavirus A NOT DETECTED NOT DETECTED Final   Sapovirus (I, II, IV, and V) NOT DETECTED NOT DETECTED Final    Comment: Performed at [facility], [address] [place], [place] [address]  C Difficile Quick Screen w PCR reflex     Status: None   Collection Time: 12/10/23 12:57 AM   Specimen: Stool  Result Value Ref Range Status   C Diff antigen NEGATIVE NEGATIVE Final   C Diff toxin NEGATIVE NEGATIVE Final   C Diff interpretation No C. difficile  detected.  Final    Comment: Performed at [facility], [address] [place], [place] [address]  Culture, blood (single) w Reflex to ID Panel     Status: None (Preliminary result)   Collection Time: 12/10/23  9:05 AM   Specimen: BLOOD RIGHT HAND  Result Value Ref Range Status   Specimen Description BLOOD RIGHT HAND  Final   Special Requests   Final    BOTTLES DRAWN AEROBIC ONLY Blood Culture adequate volume   Culture   Final    NO GROWTH 3 DAYS Performed at [facility], [address], [place], [place] [address]    Report Status PENDING  Incomplete      Radiology Studies: No results found.     LOS: 4 days    Elgin Lam, MD [facility] 12/14/2023, 10:08 AM   If 7PM-7AM, please contact night-coverage [email or web address]

## 2023-12-15 ENCOUNTER — Inpatient Hospital Stay (HOSPITAL_COMMUNITY)

## 2023-12-15 DIAGNOSIS — J9601 Acute respiratory failure with hypoxia: Secondary | ICD-10-CM | POA: Diagnosis not present

## 2023-12-15 DIAGNOSIS — I2699 Other pulmonary embolism without acute cor pulmonale: Secondary | ICD-10-CM | POA: Diagnosis not present

## 2023-12-15 DIAGNOSIS — K56600 Partial intestinal obstruction, unspecified as to cause: Secondary | ICD-10-CM | POA: Diagnosis not present

## 2023-12-15 LAB — GLUCOSE, CAPILLARY
Glucose-Capillary: 104 mg/dL — ABNORMAL HIGH (ref 70–99)
Glucose-Capillary: 120 mg/dL — ABNORMAL HIGH (ref 70–99)
Glucose-Capillary: 162 mg/dL — ABNORMAL HIGH (ref 70–99)
Glucose-Capillary: 163 mg/dL — ABNORMAL HIGH (ref 70–99)

## 2023-12-15 LAB — BASIC METABOLIC PANEL WITH GFR
Anion gap: 8 (ref 5–15)
BUN: <5 mg/dL — ABNORMAL LOW (ref 6–20)
CO2: 24 mmol/L (ref 22–32)
Calcium: 7.7 mg/dL — ABNORMAL LOW (ref 8.9–10.3)
Chloride: 107 mmol/L (ref 98–111)
Creatinine, Ser: 0.78 mg/dL (ref 0.44–1.00)
GFR, Estimated: >60 mL/min (ref >60)
Glucose, Bld: 169 mg/dL — ABNORMAL HIGH (ref 70–99)
Potassium: 4.6 mmol/L (ref 3.5–5.1)
Sodium: 139 mmol/L (ref 135–145)

## 2023-12-15 LAB — CBC
HCT: 34.4 % — ABNORMAL LOW (ref 36.0–46.0)
Hemoglobin: 11.4 g/dL — ABNORMAL LOW (ref 12.0–15.0)
MCH: 31.6 pg (ref 26.0–34.0)
MCHC: 33.1 g/dL (ref 30.0–36.0)
MCV: 95.3 fL (ref 80.0–100.0)
Platelets: 263 K/uL (ref 150–400)
RBC: 3.61 MIL/uL — ABNORMAL LOW (ref 3.87–5.11)
RDW: 14.3 % (ref 11.5–15.5)
WBC: 10.9 K/uL — ABNORMAL HIGH (ref 4.0–10.5)
nRBC: 0 % (ref 0.0–0.2)

## 2023-12-15 LAB — CULTURE, BLOOD (SINGLE)
Culture: NO GROWTH
Special Requests: ADEQUATE

## 2023-12-15 LAB — BLOOD GAS, VENOUS
Acid-Base Excess: 5.1 mmol/L — ABNORMAL HIGH (ref 0.0–2.0)
Bicarbonate: 29 mmol/L — ABNORMAL HIGH (ref 20.0–28.0)
O2 Saturation: 88.2 %
Patient temperature: 36.7
pCO2, Ven: 38 mmHg — ABNORMAL LOW (ref 44–60)
pH, Ven: 7.49 — ABNORMAL HIGH (ref 7.25–7.43)
pO2, Ven: 52 mmHg — ABNORMAL HIGH (ref 32–45)

## 2023-12-15 LAB — HEPARIN LEVEL (UNFRACTIONATED): Heparin Unfractionated: 0.49 [IU]/mL (ref 0.30–0.70)

## 2023-12-15 LAB — BRAIN NATRIURETIC PEPTIDE: B Natriuretic Peptide: 270.2 pg/mL — ABNORMAL HIGH (ref 0.0–100.0)

## 2023-12-15 MED ORDER — APIXABAN 5 MG PO TABS: 10.0000 mg | ORAL_TABLET | Freq: Two times a day (BID) | ORAL | Status: DC

## 2023-12-15 MED ORDER — IOHEXOL 9 MG/ML PO SOLN
ORAL | Status: AC
  Filled 2023-12-15: qty 1000

## 2023-12-15 MED ORDER — IOHEXOL 350 MG/ML SOLN
75.0000 mL | Freq: Once | INTRAVENOUS | Status: AC | PRN
  Administered 2023-12-15: 75 mL via INTRAVENOUS

## 2023-12-15 MED ORDER — IOHEXOL 9 MG/ML PO SOLN: 500.0000 mL | ORAL | Status: DC

## 2023-12-15 MED ORDER — APIXABAN 5 MG PO TABS: 5.0000 mg | ORAL_TABLET | Freq: Two times a day (BID) | ORAL | Status: DC

## 2023-12-15 MED ORDER — HEPARIN (PORCINE) 25000 UT/250ML-% IV SOLN
1550.0000 [IU]/h | INTRAVENOUS | Status: DC
  Administered 2023-12-15 – 2023-12-16 (×3): 1450 [IU]/h via INTRAVENOUS
  Filled 2023-12-15 (×3): qty 250

## 2023-12-15 MED ORDER — IOHEXOL 9 MG/ML PO SOLN
500.0000 mL | ORAL | Status: AC
  Administered 2023-12-15 (×2): 500 mL via ORAL

## 2023-12-15 MED ORDER — IPRATROPIUM-ALBUTEROL 0.5-2.5 (3) MG/3ML IN SOLN
3.0000 mL | Freq: Four times a day (QID) | RESPIRATORY_TRACT | Status: DC | PRN
  Administered 2023-12-15: 3 mL via RESPIRATORY_TRACT
  Filled 2023-12-15: qty 3

## 2023-12-15 NOTE — Progress Notes (Addendum)
 PHARMACY - ANTICOAGULATION CONSULT NOTE  Pharmacy Consult for heparin  Indication: pulmonary embolus  Allergies  Allergen Reactions   Amoxicillin Hives   Aspirin Hives   Codeine Hives   Penicillins Hives    Vital Signs: Temp: 98 F (36.7 C) (12/07 0853) Temp Source: Oral (12/07 0853) BP: 86/58 (12/07 0853) Pulse Rate: 92 (12/07 0853)  Labs: Recent Labs    12/13/23 0255 12/13/23 0933 12/14/23 0316 12/15/23 0320  HGB 11.8*  --  11.3* 11.4*  HCT 34.8*  --  33.8* 34.4*  PLT 368  --  296 263  HEPARINUNFRC 0.44  --  0.53 0.49  CREATININE  --  0.61 0.72  --     Estimated Creatinine Clearance: 70.9 mL/min (by C-G formula based on SCr of 0.72 mg/dL).   Medical History: Past Medical History:  Diagnosis Date   Acid reflux    Adrenal benign tumor    Apparently enlarging and patient is supposed to have it taken out   Anxiety    Depression    Diabetes mellitus without complication (HCC)    DVT (deep vein thrombosis) in pregnancy    Hypertension    Nausea vomiting and diarrhea 03/24/2021   PE (pulmonary embolism)    Stroke [facility])      Assessment: 58 y/o F 10 days post-op from small bowel resection presents to the ED with increased N/V and surgical site drainage. CT abdomen revealed a possible RLL pulmonary artery filling defect. A CT Angio study was then completed which showed bilateral PE.  CBC stable.  Heparin  level therapeutic this morning at 0.49. Heparin  was discontinued and stopped @ 0825 and now consult to restart  Goal of Therapy:  Heparin  level 0.3-0.7 units/ml Monitor platelets by anticoagulation protocol: Yes   Plan:  Continue heparin  at 1450 units / hr Daily CBC/Heparin  level Monitor for bleeding F/u transition back to Eliquis    Thank you for allowing pharmacy to be a part of this patient's care.   Bascom JAYSON Louder, PharmD 12/15/2023 9:47 AM  **Pharmacist phone directory can be found on [email or web address] listed under [facility]**

## 2023-12-15 NOTE — Progress Notes (Signed)
 Pt BP dropped . Little SHOB and trouble breathing . Oxygen placed and MD notified .   12/15/23 0839  Vitals  Temp 98 F (36.7 C)  Temp Source Oral  BP (!) 79/60  MAP (mmHg) 66  BP Location Right Arm  BP Method Automatic  Patient Position (if appropriate) Lying  Pulse Rate 99  Pulse Rate Source Monitor  ECG Heart Rate 98  Resp (!) 22  Level of Consciousness  Level of Consciousness Alert  Oxygen Therapy  SpO2 93 %  O2 Device Nasal Cannula  O2 Flow Rate (L/min) 4 L/min

## 2023-12-15 NOTE — Significant Event (Signed)
 Rapid Response Event Note   Reason for Call :  SBP 70s, increase in O2 needs  Initial Focused Assessment:  Patient sitting in bed, with mild WOB and some complaints of SOB. Patient WOB improved throughout exam. States she felt better after position change from supine to semi-fowlers. Lungs rhonchi bilaterally. Skin warm, diaphoretic (mostly chest/back).   90/62 (70) HR 92 RR 25 O2 94% 6L  Interventions/Plan of Care:  Gen surg PA at bedside TRH MD to bedside CXR  PIV obtained  labs O2 titrated to 4L, 99%   Event Summary:  MD Notified: Burnard DORISTINE FABIENE Briana MD Call Time: 0930 Arrival Time: 0935 End Time: 83  Tonna Chiquita POUR, RN

## 2023-12-15 NOTE — Progress Notes (Signed)
 Subjective: Patient reported abdomen is stable, 2 loose BMs yesterday but not large volume. Acutely was hypotensive and with desaturation on room air and increased work of breathing. Both hypotension and oxygenation improved with supplemental O2 and sitting patient up. Rapid called and STAT CXR ordered as well as VBG. Notified primary attending who presented to bedside to evaluate patient. Patient looked improved with change in position.    Objective: Vital signs in last 24 hours: Temp:  [97.5 F (36.4 C)-98.3 F (36.8 C)] 98 F (36.7 C) (12/07 0853) Pulse Rate:  [86-101] 94 (12/07 0955) Resp:  [13-32] 19 (12/07 0955) BP: (74-108)/(52-70) 87/64 (12/07 0955) SpO2:  [93 %-99 %] 99 % (12/07 0955) Weight:  [67 kg] 67 kg (12/07 0440) Last BM Date : 12/14/23  Intake/Output from previous day: 12/06 0701 - 12/07 0700 In: 1185.9 [P.O.:240; I.V.:557.2; IV Piggyback:388.6] Out: -  Intake/Output this shift: No intake/output data recorded.  PE: Gen: chronically unwell appearing female Heart: RRR, initially hypotensive but responsive and improved some with position change Lungs: increased effort, wheezing and junky cough, O2 sat improved from high 80s on room air to 95% on 6L Abd: soft,appropriately ttp, ND, midline wound base is pale with some fibrinous exudate, but no overt infection. Fascia is intact.  Some diffuse tenderness, but no guarding/peritonitis.  Lab Results:  Recent Labs    12/14/23 0316 12/15/23 0320  WBC 9.1 10.9*  HGB 11.3* 11.4*  HCT 33.8* 34.4*  PLT 296 263   BMET Recent Labs    12/13/23 0933 12/13/23 1929 12/14/23 0316 12/14/23 1358  NA 142  --  140  --   K 2.4*   < > 2.7* 4.1  CL 104  --  104  --   CO2 29  --  27  --   GLUCOSE 142*  --  122*  --   BUN <5*  --  <5*  --   CREATININE 0.61  --  0.72  --   CALCIUM  7.4*  --  7.2*  --    < > = values in this interval not displayed.   PT/INR No results for input(s): LABPROT, INR in the last 72  hours. CMP     Component Value Date/Time   NA 140 12/14/2023 0316   NA 134 05/01/2021 1532   K 4.1 12/14/2023 1358   CL 104 12/14/2023 0316   CO2 27 12/14/2023 0316   GLUCOSE 122 (H) 12/14/2023 0316   BUN <5 (L) 12/14/2023 0316   BUN 8 05/01/2021 1532   CREATININE 0.72 12/14/2023 0316   CALCIUM  7.2 (L) 12/14/2023 0316   PROT 4.5 (L) 12/10/2023 0913   PROT 7.4 05/01/2021 1532   ALBUMIN  1.9 (L) 12/14/2023 1358   ALBUMIN  4.4 05/01/2021 1532   AST 11 (L) 12/10/2023 0913   ALT 11 12/10/2023 0913   ALKPHOS 57 12/10/2023 0913   BILITOT 0.4 12/10/2023 0913   BILITOT 0.3 05/01/2021 1532   GFRNONAA >60 12/14/2023 0316   GFRAA >60 02/28/2019 2041   Lipase     Component Value Date/Time   LIPASE 21 12/09/2023 2243       Studies/Results: No results found.   Anti-infectives: Anti-infectives (From admission, onward)    Start     Dose/Rate Route Frequency Ordered Stop   12/10/23 2200  cefTRIAXone  (ROCEPHIN ) 2 g in sodium chloride  0.9 % 100 mL IVPB        2 g 200 mL/hr over 30 Minutes Intravenous Every 24 hours  12/10/23 0303     12/10/23 1000  metroNIDAZOLE  (FLAGYL ) IVPB 500 mg        500 mg 100 mL/hr over 60 Minutes Intravenous Every 12 hours 12/10/23 0303     12/10/23 0100  cefTRIAXone  (ROCEPHIN ) 2 g in sodium chloride  0.9 % 100 mL IVPB        2 g 200 mL/hr over 30 Minutes Intravenous  Once 12/10/23 0058 12/10/23 0154   12/10/23 0100  metroNIDAZOLE  (FLAGYL ) IVPB 500 mg        500 mg 100 mL/hr over 60 Minutes Intravenous  Once 12/10/23 0058 12/10/23 0302        Assessment/Plan S/p ex lap with SBR for internal hernia with ischemic bowel by Dr. Sebastian on 11/21 now with ileus and pancolitis - C.diff and GI panel negative  - GI consulted 12/5 - agree with completing course of abx for pancolitis  - diet as tolerated - continue BID WTD to midline  - if imaging of chest obtained later today then reasonable to scan abdomen as well, although I do not suspect intraabdominal  source of hypotension and respiratory distress noted earlier today  Respiratory distress - improved with supplemental O2 and sitting up. Give duoneb and continue to monitor. CXR looks ok, remainder of workup per TR[facility]  FEN - soft diet VTE - heparin  gtt - ok to transition to DOAC from abdominal standpoint but putting back on hep gtt with respiratory change  ID - Rocephin /Flagyl   --per TR[facility]-- B PEs - on heparin  per medicine DM H/o CVA HTN   LOS: 5 days    Burnard JONELLE Louder , [facility] 12/15/2023, 10:19 AM Please see Amion for pager number during day hours 7:00am-4:30pm or 7:00am -11:30am on weekends

## 2023-12-15 NOTE — Progress Notes (Signed)
 PROGRESS NOTE    [name]  FMW:[id number] DOB: Oct 30, 1965 DOA: 12/09/2023 PCP: Freddrick, No   Brief Narrative: [name] is a 58 y.o. female with a history of hypertension, diabetes mellitus type 2, DVT, PE, left adrenal mass, small bowel obstruction status post partial bowel resection.  Patient presented secondary to abdominal pain, diarrhea, vomiting with concern for obstruction and found to have evidence of ileus in addition to pancolitis concern for infection.  Patient met sepsis criteria on admission.  General surgery consulted.  She was started empirically on antibiotics and an NG tube was placed.   Assessment and Plan:  Severe sepsis Present on admission.  Source is GI with evidence of colitis on CT imaging.  Patient started empirically on ceftriaxone  and Flagyl .  GI panel and C. difficile negative.  Blood culture obtained on admission is no growth to date. Leukocytosis improved. Afebrile. - Continue antibiotics  Pancolitis CT imaging on admission significant for evidence of pancolitis greatest about the sigmoid colon and rectum.  Patient started empirically on ceftriaxone  and Flagyl .  GI pathogen panel and C. difficile testing negative. WBC shows good response to current antibiotic regimen. GI consulted and recommended no change in management. - Continue ceftriaxone  and Flagyl   Ileus Likely related to recent abdominal surgery.  Diagnosed on CT imaging.  General surgery consulted.  NG tube placed on 12/2 for management and removed on 12/5. Diet advancing. - General Surge recommendations: soft diet  Acute recurrent pulmonary embolism Diagnosed on admission via CTA chest which was significant for an acute segmental and subsegmental pulm emboli in the bilateral lower lobes and left upper lobe pulmonary arteries and without evidence of right heart strain.  Hemodynamically stable.  Patient started on heparin  IV for management. - Continue heparin  IV with plans to transition  to Eliquis  prior to discharge  Acute respiratory failure with hypoxia Patient appears to have developed acute pulmonary edema of unclear etiology. Could possibly also have had an aspiration event. Patient with hypoxia and respiratory distress requiring application of supplemental oxygen on 12/4. Chest x-ray revealed concern for possible pulmonary edema and BNP elevated at 721.9. Transthoracic Echocardiogram this admission shows normal LV and RV function. No associated arrhythmia noted. Appears to be improved with Lasix . Oxygen weaned to room air. Patient had a recurrent episode on 12/7. Repeat BNP is improved down to 270. Chest x-ray with a worsening right infiltrate. Symptoms improved with transition to fowler position.  -Check CT chest -Will add CT abdomen/pelvis per surgery recommendations  Metabolic acidosis Likely related to severe hypoxia overnight. CO2 down to 12. Resolved with sodium bicarbonate  IV fluids.  Acute DVT Lower extremity venous duplex scan significant for an acute DVT involving the common femoral vein, right femoral vein and right popliteal vein. Likely provoked, related to recent surgery. - Continue anticoagulation and plan to transition to Eliquis  for discharge  Hypokalemia Recurrent. Likely related to GI losses. Complicated by associated hypomagnesemia. Resolved with supplementation.  Hypocalcemia Corrected calcium  from 12/2 of about 8.4.  Hypomagnesemia Patient given magnesium  supplementation. Resolved.  Nonsustained ventricular tachycardia Noted. Possibly related to electrolyte imbalances. Patient started on metoprolol  which is now discontinued. - Manage electrolytes  Diabetes mellitus type 2 Uncontrolled with hyperglycemia based on hemoglobin A1c of 9.7%. patient started on SSI. - Continue SSI  History of CVA Noted.  No residual symptoms.  He does not appear patient is on medication management.  Left adrenal mass Noted on CT imaging.  Recommendation for  surgical consultation and medical workup.  Paitent has already been referred to endocrinology.  Tobacco abuse Patient declined nicotine  patch.    DVT prophylaxis: Heparin  IV Code Status:   Code Status: Limited: Do not attempt resuscitation (DNR) -DNR-LIMITED -Do Not Intubate/DNI  Family Communication: None at bedside Disposition Plan: Discharge home once electrolytes stable and respiratory status is stable   Consultants:  General surgery Gastroenterology  Procedures:  None  Antimicrobials: Ceftriaxone  Flagyl    Subjective: Patient initially feeling great with no concerns overnight and no abdominal pain.  Called to the bedside after initial evaluation secondary to patient developing acute respiratory distress and failure. Upon returning, patient's respiratory distress had improved once patient's head of bed was raised and oxygen applied for hypoxia.  Objective: BP (!) 86/55 (BP Location: Right Arm)   Pulse 98   Temp 98.3 F (36.8 C) (Oral)   Resp (!) 22   Ht 5' 6 (1.676 m)   Wt 67 kg   LMP  (LMP Unknown) Comment: a few months  SpO2 91%   BMI 23.84 kg/m   Examination:  General exam: Appears comfortable. Respiratory system: Clear. Respiratory effort normal. Cardiovascular system: S1 & S2 heard, RRR. Gastrointestinal system: Abdomen is nondistended, soft and non-tender. Normal bowel sounds heard. Central nervous system: Alert and oriented. No focal neurological deficits.  Reexamination: General exam: Appears calm and comfortable, sitting upright in bed Respiratory system: Diffuse rhonchi. Respiratory effort normal. Cardiovascular system: S1 & S2 heard, RRR. Gastrointestinal system: Abdomen is distended, soft and nontender. Normal bowel sounds heard. Central nervous system: Alert and oriented. Psychiatry: Judgement and insight appear normal. Mood & affect appropriate.    Data Reviewed: I have personally reviewed following labs and imaging studies  CBC Lab  Results  Component Value Date   WBC 10.9 (H) 12/15/2023   RBC 3.61 (L) 12/15/2023   HGB 11.4 (L) 12/15/2023   HCT 34.4 (L) 12/15/2023   MCV 95.3 12/15/2023   MCH 31.6 12/15/2023   PLT 263 12/15/2023   MCHC 33.1 12/15/2023   RDW 14.3 12/15/2023   LYMPHSABS 2.8 12/10/2023   MONOABS 1.4 (H) 12/10/2023   EOSABS 0.3 12/10/2023   BASOSABS 0.1 12/10/2023     Last metabolic panel Lab Results  Component Value Date   NA 139 12/15/2023   K 4.6 12/15/2023   CL 107 12/15/2023   CO2 24 12/15/2023   BUN <5 (L) 12/15/2023   CREATININE 0.78 12/15/2023   GLUCOSE 169 (H) 12/15/2023   GFRNONAA >60 12/15/2023   GFRAA >60 02/28/2019   CALCIUM  7.7 (L) 12/15/2023   PHOS 2.3 (L) 12/03/2023   PROT 4.5 (L) 12/10/2023   ALBUMIN  1.9 (L) 12/14/2023   LABGLOB 3.0 05/01/2021   AGRATIO 1.5 05/01/2021   BILITOT 0.4 12/10/2023   ALKPHOS 57 12/10/2023   AST 11 (L) 12/10/2023   ALT 11 12/10/2023   ANIONGAP 8 12/15/2023    GFR: Estimated Creatinine Clearance: 70.9 mL/min (by C-G formula based on SCr of 0.78 mg/dL).  Recent Results (from the past 240 hours)  Gastrointestinal Panel by PCR , Stool     Status: None   Collection Time: 12/10/23 12:57 AM   Specimen: Stool  Result Value Ref Range Status   Campylobacter species NOT DETECTED NOT DETECTED Final   Plesimonas shigelloides NOT DETECTED NOT DETECTED Final   Salmonella species NOT DETECTED NOT DETECTED Final   Yersinia enterocolitica NOT DETECTED NOT DETECTED Final   Vibrio species NOT DETECTED NOT DETECTED Final   Vibrio cholerae NOT DETECTED NOT DETECTED Final   Enteroaggregative  E coli (EAEC) NOT DETECTED NOT DETECTED Final   Enteropathogenic E coli (EPEC) NOT DETECTED NOT DETECTED Final   Enterotoxigenic E coli (ETEC) NOT DETECTED NOT DETECTED Final   Shiga like toxin producing E coli (STEC) NOT DETECTED NOT DETECTED Final   Shigella/Enteroinvasive E coli (EIEC) NOT DETECTED NOT DETECTED Final   Cryptosporidium NOT DETECTED NOT DETECTED  Final   Cyclospora cayetanensis NOT DETECTED NOT DETECTED Final   Entamoeba histolytica NOT DETECTED NOT DETECTED Final   Giardia lamblia NOT DETECTED NOT DETECTED Final   Adenovirus F40/41 NOT DETECTED NOT DETECTED Final   Astrovirus NOT DETECTED NOT DETECTED Final   Norovirus GI/GII NOT DETECTED NOT DETECTED Final   Rotavirus A NOT DETECTED NOT DETECTED Final   Sapovirus (I, II, IV, and V) NOT DETECTED NOT DETECTED Final    Comment: Performed at [facility], [address] [place], [place] [address]  C Difficile Quick Screen w PCR reflex     Status: None   Collection Time: 12/10/23 12:57 AM   Specimen: Stool  Result Value Ref Range Status   C Diff antigen NEGATIVE NEGATIVE Final   C Diff toxin NEGATIVE NEGATIVE Final   C Diff interpretation No C. difficile detected.  Final    Comment: Performed at [facility], [address] [place], [place] [address]  Culture, blood (single) w Reflex to ID Panel     Status: None   Collection Time: 12/10/23  9:05 AM   Specimen: BLOOD RIGHT HAND  Result Value Ref Range Status   Specimen Description BLOOD RIGHT HAND  Final   Special Requests   Final    BOTTLES DRAWN AEROBIC ONLY Blood Culture adequate volume   Culture   Final    NO GROWTH 5 DAYS Performed at [facility], [address], [place], [place] [address]    Report Status 12/15/2023 FINAL  Final      Radiology Studies: DG CHEST PORT 1 VIEW Result Date: 12/15/2023 EXAM: 1 VIEW(S) XRAY OF THE CHEST 12/15/2023 10:09:40 AM COMPARISON: 12/12/2023 CLINICAL HISTORY: 200808 Hypoxia 200808 FINDINGS: LINES, TUBES AND DEVICES: Enteric tube removed. Left chest cardiac loop recorder or leadless pacemaker noted. LUNGS AND PLEURA: Diffuse interstitial opacities. Increased right perihilar and basilar heterogeneous opacities. Increased small right pleural effusion. Unchanged trace left pleural effusion. No pneumothorax. HEART AND MEDIASTINUM: Left chest cardiac loop recorder or  leadless pacemaker noted. No acute abnormality of the cardiac and mediastinal silhouettes. BONES AND SOFT TISSUES: No acute osseous abnormality. IMPRESSION: 1. Increased right perihilar and basilar heterogeneous opacities. 2. Increased small right pleural effusion. 3. Unchanged trace left pleural effusion. Electronically signed by: Evalene Coho MD 12/15/2023 10:33 AM EST RP Workstation: [id number]       LOS: 5 days    Elgin Lam, MD [facility] 12/15/2023, 2:01 PM   If 7PM-7AM, please contact night-coverage [email or web address]

## 2023-12-15 NOTE — Discharge Instructions (Signed)
 Information on my medicine - ELIQUIS  (apixaban )  This medication education was reviewed with me or my healthcare representative as part of my discharge preparation.    Why was Eliquis  prescribed for you? Eliquis  was prescribed to treat blood clots that may have been found in the veins of your legs (deep vein thrombosis) or in your lungs (pulmonary embolism) and to reduce the risk of them occurring again.  What do You need to know about Eliquis  ? The starting dose is 10 mg (two 5 mg tablets) taken TWICE daily for the FIRST SEVEN (7) DAYS, then on 12/24/2023  the dose is reduced to ONE 5 mg tablet taken TWICE daily.  Eliquis  may be taken with or without food.   Try to take the dose about the same time in the morning and in the evening. If you have difficulty swallowing the tablet whole please discuss with your pharmacist how to take the medication safely.  Take Eliquis  exactly as prescribed and DO NOT stop taking Eliquis  without talking to the doctor who prescribed the medication.  Stopping may increase your risk of developing a new blood clot.  Refill your prescription before you run out.  After discharge, you should have regular check-up appointments with your healthcare provider that is prescribing your Eliquis .    What do you do if you miss a dose? If a dose of ELIQUIS  is not taken at the scheduled time, take it as soon as possible on the same day and twice-daily administration should be resumed. The dose should not be doubled to make up for a missed dose.  Important Safety Information A possible side effect of Eliquis  is bleeding. You should call your healthcare provider right away if you experience any of the following: Bleeding from an injury or your nose that does not stop. Unusual colored urine (red or dark brown) or unusual colored stools (red or black). Unusual bruising for unknown reasons. A serious fall or if you hit your head (even if there is no bleeding).  Some  medicines may interact with Eliquis  and might increase your risk of bleeding or clotting while on Eliquis . To help avoid this, consult your healthcare provider or pharmacist prior to using any new prescription or non-prescription medications, including herbals, vitamins, non-steroidal anti-inflammatory drugs (NSAIDs) and supplements.  This website has more information on Eliquis  (apixaban ): http://[email or web address] .[email or web address] dena

## 2023-12-16 DIAGNOSIS — I2699 Other pulmonary embolism without acute cor pulmonale: Secondary | ICD-10-CM | POA: Diagnosis not present

## 2023-12-16 DIAGNOSIS — J9601 Acute respiratory failure with hypoxia: Secondary | ICD-10-CM | POA: Diagnosis not present

## 2023-12-16 DIAGNOSIS — K56600 Partial intestinal obstruction, unspecified as to cause: Secondary | ICD-10-CM | POA: Diagnosis not present

## 2023-12-16 LAB — CBC
HCT: 35.1 % — ABNORMAL LOW (ref 36.0–46.0)
Hemoglobin: 11.6 g/dL — ABNORMAL LOW (ref 12.0–15.0)
MCH: 31.2 pg (ref 26.0–34.0)
MCHC: 33 g/dL (ref 30.0–36.0)
MCV: 94.4 fL (ref 80.0–100.0)
Platelets: 259 K/uL (ref 150–400)
RBC: 3.72 MIL/uL — ABNORMAL LOW (ref 3.87–5.11)
RDW: 14.5 % (ref 11.5–15.5)
WBC: 11.8 K/uL — ABNORMAL HIGH (ref 4.0–10.5)
nRBC: 0 % (ref 0.0–0.2)

## 2023-12-16 LAB — GLUCOSE, CAPILLARY
Glucose-Capillary: 97 mg/dL (ref 70–99)
Glucose-Capillary: 99 mg/dL (ref 70–99)
Glucose-Capillary: 192 mg/dL — ABNORMAL HIGH (ref 70–99)

## 2023-12-16 LAB — HEPARIN LEVEL (UNFRACTIONATED): Heparin Unfractionated: 0.3 [IU]/mL (ref 0.30–0.70)

## 2023-12-16 MED ORDER — AZITHROMYCIN 500 MG PO TABS
500.0000 mg | ORAL_TABLET | Freq: Every day | ORAL | Status: DC
  Administered 2023-12-16 – 2023-12-17 (×2): 500 mg via ORAL
  Filled 2023-12-16 (×2): qty 1

## 2023-12-16 MED ORDER — FUROSEMIDE 10 MG/ML IJ SOLN
20.0000 mg | Freq: Once | INTRAMUSCULAR | Status: AC
  Administered 2023-12-16: 20 mg via INTRAVENOUS
  Filled 2023-12-16: qty 2

## 2023-12-16 NOTE — Progress Notes (Signed)
 PHARMACY - ANTICOAGULATION CONSULT NOTE  Pharmacy Consult for heparin  Indication: pulmonary embolus  Allergies  Allergen Reactions   Amoxicillin Hives   Aspirin Hives   Codeine Hives   Penicillins Hives    Vital Signs: Temp: 98 F (36.7 C) (12/08 0731) Temp Source: Axillary (12/08 0731) BP: 109/64 (12/08 0731) Pulse Rate: 95 (12/08 0326)  Labs: Recent Labs    12/14/23 0316 12/15/23 0320 12/15/23 0948 12/16/23 0317  HGB 11.3* 11.4*  --  11.6*  HCT 33.8* 34.4*  --  35.1*  PLT 296 263  --  259  HEPARINUNFRC 0.53 0.49  --  0.30  CREATININE 0.72  --  0.78  --     Estimated Creatinine Clearance: 70.9 mL/min (by C-G formula based on SCr of 0.78 mg/dL).   Medical History: Past Medical History:  Diagnosis Date   Acid reflux    Adrenal benign tumor    Apparently enlarging and patient is supposed to have it taken out   Anxiety    Depression    Diabetes mellitus without complication (HCC)    DVT (deep vein thrombosis) in pregnancy    Hypertension    Nausea vomiting and diarrhea 03/24/2021   PE (pulmonary embolism)    Stroke [facility])      Assessment: 58 y/o F 10 days post-op from small bowel resection presents to the ED with increased N/V and surgical site drainage. CT abdomen revealed a possible RLL pulmonary artery filling defect. A CT Angio study was then completed which showed bilateral PE.  CBC stable.  Heparin  level therapeutic this morning at 0.3 on heparin  drip rate 1450 uts/hr  No bleeding noted CBC stable  Goal of Therapy:  Heparin  level 0.3-0.7 units/ml Monitor platelets by anticoagulation protocol: Yes   Plan:  Continue heparin  at 1450 units / hr Daily CBC/Heparin  level Monitor for bleeding F/u transition back to Eliquis   Olam Chalk Pharm.D. CPP, BCPS Clinical Pharmacist [phone] 12/16/2023 10:57 AM   **Pharmacist phone directory can be found on [email or web address] listed under [facility]**

## 2023-12-16 NOTE — Progress Notes (Signed)
 PROGRESS NOTE    [name]  FMW:[id number] DOB: [birth year] DOA: 12/09/2023 PCP: Freddrick, No   Brief Narrative: [name] is a 58 y.o. female with a history of hypertension, diabetes mellitus type 2, DVT, PE, left adrenal mass, small bowel obstruction status post partial bowel resection.  Patient presented secondary to abdominal pain, diarrhea, vomiting with concern for obstruction and found to have evidence of ileus in addition to pancolitis concern for infection.  Patient met sepsis criteria on admission.  General surgery consulted.  She was started empirically on antibiotics and an NG tube was placed.   Assessment and Plan:  Severe sepsis Present on admission.  Source is GI with evidence of colitis on CT imaging.  Patient started empirically on ceftriaxone  and Flagyl .  GI panel and C. difficile negative.  Blood culture obtained on admission is no growth to date. Leukocytosis improved. Afebrile. - Continue antibiotics  Pancolitis CT imaging on admission significant for evidence of pancolitis greatest about the sigmoid colon and rectum.  Patient started empirically on ceftriaxone  and Flagyl .  GI pathogen panel and C. difficile testing negative. WBC shows good response to current antibiotic regimen. GI consulted and recommended no change in management. Abdominal pain worsened today, per patient. - Continue ceftriaxone  and Flagyl   Ileus Likely related to recent abdominal surgery.  Diagnosed on CT imaging.  General surgery consulted.  NG tube placed on 12/2 for management and removed on 12/5. Diet advanced. - General Surge recommendations: soft diet  Acute recurrent pulmonary embolism Diagnosed on admission via CTA chest which was significant for an acute segmental and subsegmental pulm emboli in the bilateral lower lobes and left upper lobe pulmonary arteries and without evidence of right heart strain.  Hemodynamically stable.  Patient started on heparin  IV for management. -  Continue heparin  IV with plans to transition to Eliquis  prior to discharge  Acute respiratory failure with hypoxia Patient appears to have developed acute pulmonary edema of unclear etiology. Could possibly also have had an aspiration event. Patient with hypoxia and respiratory distress requiring application of supplemental oxygen on 12/4. Chest x-ray revealed concern for possible pulmonary edema and BNP elevated at 721.9. Transthoracic Echocardiogram this admission shows normal LV and RV function. No associated arrhythmia noted. Appears to be improved with Lasix . Oxygen weaned to room air. Patient had a recurrent episode on 12/7. Repeat BNP is improved down to 270. Chest x-ray with a worsening right infiltrate. Symptoms improved with transition to fowler position. CT chest suggests pneumonia.  Right upper lobe pneumonia Noted on CT imaging from 12/7. Patient with non-productive cough. Afebrile. Patient is already on Ceftriaxone . - Continue Ceftriaxone  - Add azithromycin  500 mg x5 days  Metabolic acidosis Likely related to severe hypoxia overnight. CO2 down to 12. Resolved with sodium bicarbonate  IV fluids.  Acute DVT Lower extremity venous duplex scan significant for an acute DVT involving the common femoral vein, right femoral vein and right popliteal vein. Likely provoked, related to recent surgery. - Continue anticoagulation and plan to transition to Eliquis  for discharge  Anasarca Noted on CT imaging. - Lasix  IV  Ascites Noted on CT imaging. No evidence of liver disease. Unclear if related to recent surgery, although volume is increasd. Lasix  IV started for anasarca. Patient has worsening abdominal pain and may benefit from a paracentesis if no improvement.  Hypokalemia Recurrent. Likely related to GI losses. Complicated by associated hypomagnesemia. Resolved with supplementation.  Hypocalcemia Corrected calcium  from 12/2 of about 8.4.  Hypomagnesemia Patient given magnesium   supplementation. Resolved.  Nonsustained ventricular tachycardia Noted. Possibly related to electrolyte imbalances. Patient started on metoprolol  which is now discontinued. - Manage electrolytes  Diabetes mellitus type 2 Uncontrolled with hyperglycemia based on hemoglobin A1c of 9.7%. patient started on SSI. - Continue SSI  History of CVA Noted.  No residual symptoms.  He does not appear patient is on medication management.  Left adrenal mass Noted on CT imaging.  Recommendation for surgical consultation and medical workup. Paitent has already been referred to endocrinology.  Tobacco abuse Patient declined nicotine  patch.    DVT prophylaxis: Heparin  IV Code Status:   Code Status: Limited: Do not attempt resuscitation (DNR) -DNR-LIMITED -Do Not Intubate/DNI  Family Communication: None at bedside Disposition Plan: Discharge home once oxygen stable, and after diuresis   Consultants:  General surgery Gastroenterology  Procedures:  None  Antimicrobials: Ceftriaxone  Flagyl    Subjective: Patient reports feeling worse today with worsened abdominal pain.  Objective: BP 109/64 (BP Location: Right Arm)   Pulse 95   Temp 98 F (36.7 C) (Axillary)   Resp 17   Ht 5' 6 (1.676 m)   Wt 67.7 kg   LMP  (LMP Unknown) Comment: a few months  SpO2 98%   BMI 24.09 kg/m   Examination:  General exam: Appears calm and comfortable. Respiratory system: Clear to auscultation. Respiratory effort normal. Cardiovascular system: S1 & S2 heard, RRR. No murmur. Gastrointestinal system: Abdomen is distended, soft and diffusely tender. Normal bowel sounds heard. Central nervous system: Alert and oriented. No focal neurological deficits. Psychiatry: Judgement and insight appear normal. Mood & affect appropriate.    Data Reviewed: I have personally reviewed following labs and imaging studies  CBC Lab Results  Component Value Date   WBC 11.8 (H) 12/16/2023   RBC 3.72 (L) 12/16/2023    HGB 11.6 (L) 12/16/2023   HCT 35.1 (L) 12/16/2023   MCV 94.4 12/16/2023   MCH 31.2 12/16/2023   PLT 259 12/16/2023   MCHC 33.0 12/16/2023   RDW 14.5 12/16/2023   LYMPHSABS 2.8 12/10/2023   MONOABS 1.4 (H) 12/10/2023   EOSABS 0.3 12/10/2023   BASOSABS 0.1 12/10/2023     Last metabolic panel Lab Results  Component Value Date   NA 139 12/15/2023   K 4.6 12/15/2023   CL 107 12/15/2023   CO2 24 12/15/2023   BUN <5 (L) 12/15/2023   CREATININE 0.78 12/15/2023   GLUCOSE 169 (H) 12/15/2023   GFRNONAA >60 12/15/2023   GFRAA >60 02/28/2019   CALCIUM  7.7 (L) 12/15/2023   PHOS 2.3 (L) 12/03/2023   PROT 4.5 (L) 12/10/2023   ALBUMIN  1.9 (L) 12/14/2023   LABGLOB 3.0 05/01/2021   AGRATIO 1.5 05/01/2021   BILITOT 0.4 12/10/2023   ALKPHOS 57 12/10/2023   AST 11 (L) 12/10/2023   ALT 11 12/10/2023   ANIONGAP 8 12/15/2023    GFR: Estimated Creatinine Clearance: 70.9 mL/min (by C-G formula based on SCr of 0.78 mg/dL).  Recent Results (from the past 240 hours)  Gastrointestinal Panel by PCR , Stool     Status: None   Collection Time: 12/10/23 12:57 AM   Specimen: Stool  Result Value Ref Range Status   Campylobacter species NOT DETECTED NOT DETECTED Final   Plesimonas shigelloides NOT DETECTED NOT DETECTED Final   Salmonella species NOT DETECTED NOT DETECTED Final   Yersinia enterocolitica NOT DETECTED NOT DETECTED Final   Vibrio species NOT DETECTED NOT DETECTED Final   Vibrio cholerae NOT DETECTED NOT DETECTED Final   Enteroaggregative E  coli (EAEC) NOT DETECTED NOT DETECTED Final   Enteropathogenic E coli (EPEC) NOT DETECTED NOT DETECTED Final   Enterotoxigenic E coli (ETEC) NOT DETECTED NOT DETECTED Final   Shiga like toxin producing E coli (STEC) NOT DETECTED NOT DETECTED Final   Shigella/Enteroinvasive E coli (EIEC) NOT DETECTED NOT DETECTED Final   Cryptosporidium NOT DETECTED NOT DETECTED Final   Cyclospora cayetanensis NOT DETECTED NOT DETECTED Final   Entamoeba  histolytica NOT DETECTED NOT DETECTED Final   Giardia lamblia NOT DETECTED NOT DETECTED Final   Adenovirus F40/41 NOT DETECTED NOT DETECTED Final   Astrovirus NOT DETECTED NOT DETECTED Final   Norovirus GI/GII NOT DETECTED NOT DETECTED Final   Rotavirus A NOT DETECTED NOT DETECTED Final   Sapovirus (I, II, IV, and V) NOT DETECTED NOT DETECTED Final    Comment: Performed at [facility], [address] [place], [place] [address]  C Difficile Quick Screen w PCR reflex     Status: None   Collection Time: 12/10/23 12:57 AM   Specimen: Stool  Result Value Ref Range Status   C Diff antigen NEGATIVE NEGATIVE Final   C Diff toxin NEGATIVE NEGATIVE Final   C Diff interpretation No C. difficile detected.  Final    Comment: Performed at [facility], [address] [place], [place] [address]  Culture, blood (single) w Reflex to ID Panel     Status: None   Collection Time: 12/10/23  9:05 AM   Specimen: BLOOD RIGHT HAND  Result Value Ref Range Status   Specimen Description BLOOD RIGHT HAND  Final   Special Requests   Final    BOTTLES DRAWN AEROBIC ONLY Blood Culture adequate volume   Culture   Final    NO GROWTH 5 DAYS Performed at [facility], [address], [place], [place] [address]    Report Status 12/15/2023 FINAL  Final      Radiology Studies: CT CHEST ABDOMEN PELVIS W CONTRAST Result Date: 12/15/2023 EXAM: CT CHEST, ABDOMEN AND PELVIS WITH CONTRAST 12/15/2023 10:18:30 PM TECHNIQUE: CT of the chest, abdomen and pelvis was performed with the administration of 75 mL of iohexol  (OMNIPAQUE ) 350 MG/ML injection. Multiplanar reformatted images are provided for review. Automated exposure control, iterative reconstruction, and/or weight based adjustment of the mA/kV was utilized to reduce the radiation dose to as low as reasonably achievable. COMPARISON: 12/10/2023 CLINICAL HISTORY: Acute respiratory failure. s/p laparotomy with bowel resection, pancolitis. FINDINGS:  CHEST: MEDIASTINUM AND LYMPH NODES: Heart and pericardium are unremarkable. Calcifications in the left main and left anterior descending coronary arteries. Scattered aortic atherosclerosis. The central airways are clear. No mediastinal, hilar or axillary lymphadenopathy. LUNGS AND PLEURA: Moderate emphysema. Small bilateral pleural effusions, new since prior study. Compressive atelectasis in the lower lobes. New interstitial thickening and ground-glass opacities posteriorly in the right upper lobe could reflect pneumonia. No pneumothorax. ABDOMEN AND PELVIS: LIVER: The liver is unremarkable. GALLBLADDER AND BILE DUCTS: Gallstone within the gallbladder fundus. No biliary ductal dilatation. SPLEEN: No acute abnormality. PANCREAS: No acute abnormality. ADRENAL GLANDS: Right adrenal gland normal. Complex mixed solid and cystic mass in the left adrenal gland with scattered calcifications measures 5.7 x 4.9 cm, stable. KIDNEYS, URETERS AND BLADDER: No stones in the kidneys or ureters. No hydronephrosis. No perinephric or periureteral stranding. Urinary bladder is unremarkable. GI AND BOWEL: Stomach demonstrates no acute abnormality. There is no bowel obstruction. REPRODUCTIVE ORGANS: No acute abnormality. PERITONEUM AND RETROPERITONEUM: Moderate free fluid in the abdomen and pelvis. No  free air. VASCULATURE: Aorta is normal in caliber. Diffuse aortoiliac atherosclerosis. ABDOMINAL AND PELVIS LYMPH NODES: No lymphadenopathy. BONES AND SOFT TISSUES: Open midline surgical incision. Diffuse edema throughout the subcutaneous soft tissues compatible with anasarca. No acute osseous abnormality. IMPRESSION: 1. New interstitial thickening and ground-glass opacities in the posterior right upper lobe, concerning for pneumonia. 2. Small bilateral pleural effusions, new since prior study. 3. Coronary artery disease, aortic atherosclerosis. 4. Moderate emphysema. 5. Moderate ascites, increasing since prior study. . 6. Complex mixed  solid and cystic left adrenal mass with calcifications, stable. As recommended previously, surgical consultation recommended. 7. Anasarca. Electronically signed by: Franky Crease MD 12/15/2023 10:31 PM EST RP Workstation: [id number]   DG CHEST PORT 1 VIEW Result Date: 12/15/2023 EXAM: 1 VIEW(S) XRAY OF THE CHEST 12/15/2023 10:09:40 AM COMPARISON: 12/12/2023 CLINICAL HISTORY: 200808 Hypoxia 200808 FINDINGS: LINES, TUBES AND DEVICES: Enteric tube removed. Left chest cardiac loop recorder or leadless pacemaker noted. LUNGS AND PLEURA: Diffuse interstitial opacities. Increased right perihilar and basilar heterogeneous opacities. Increased small right pleural effusion. Unchanged trace left pleural effusion. No pneumothorax. 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 No acute abnormality of the cardiac and mediastinal silhouettes. BONES AND SOFT TISSUES: No acute osseous abnormality. IMPRESSION: 1. Increased right perihilar and basilar heterogeneous opacities. 2. Increased small right pleural effusion. 3. Unchanged trace left pleural effusion. Electronically signed by: Evalene Coho MD 12/15/2023 10:33 AM EST RP Workstation: [id number]       LOS: 6 days    Elgin Lam, MD [facility] 12/16/2023, 9:54 AM   If 7PM-7AM, please contact night-coverage [email or web address]

## 2023-12-16 NOTE — TOC Initial Note (Addendum)
 Transition of Care [facility]) - Initial/Assessment Note    Patient Details  Name: [name] MRN: [id number] Date of Birth: 1965-03-30  [facility] CM/SW Contact:    Montie LOISE Louder, LCSW Phone Number: 12/16/2023, 2:27 PM  Clinical Narrative:                  CSW received message from RN to contact the patient's daughter, Charmaine. CSW spoke with Charmaine, she expressed concerns about the patient smoking and possibly going home with oxygen. Family reports they purchase the cigarettes for the patient. CSW encouraged family to stop buying cigarettes for the patient,  and to talk with the patient about ways to be safe and possible alternatives, if needing oxygen.   Family wants the patient to go to rehab but does not believe the patient will be agreeable. CSW explained at this time the recommendation is out-patient PT. Charmaine states they have dogs and jumps on patient. However, if recommended later, patient will have to agree and insurance approvals. She states understanding, all questions answered.    Per chart review patient walked 160 feet w/ supervision.   TOC will continue to follow and assist with d/c planning.   Montie Louder, MSW, LCSW Clinical Social Worker          Patient Goals and CMS Choice            Expected Discharge Plan and Services In-house Referral: Clinical Social Work                                            Prior Living Arrangements/Services     Patient language and need for interpreter reviewed:: No        Need for Family Participation in Patient Care: Yes (Comment) Care giver support system in place?: Yes (comment)      Activities of Daily Living   ADL Screening (condition at time of admission) Independently performs ADLs?: Yes (appropriate for developmental age) Is the patient deaf or have difficulty hearing?: Yes Does the patient have difficulty seeing, even when wearing glasses/contacts?: No Does the  patient have difficulty concentrating, remembering, or making decisions?: No  Permission Sought/Granted                  Emotional Assessment       Orientation: : Oriented to Self, Oriented to Place, Oriented to  Time, Oriented to Situation Alcohol / Substance Use: Not Applicable Psych Involvement: No (comment)  Admission diagnosis:  Hypokalemia [E87.6] Hypomagnesemia [E83.42] SBO (small bowel obstruction) (HCC) [K56.609] Multiple subsegmental pulmonary emboli without acute cor pulmonale (HCC) [I26.94] Patient Active Problem List   Diagnosis Date Noted   Ileus, postoperative (HCC) 12/13/2023   Diarrhea 12/13/2023   Partial small bowel obstruction (HCC) 12/10/2023   History of DVT (deep vein thrombosis) 12/10/2023   Hypokalemia 12/10/2023   Hypomagnesemia 12/10/2023   Hypocalcemia 12/10/2023   Left adrenal mass 12/10/2023   Tobacco abuse 12/10/2023   NSVT (nonsustained ventricular tachycardia) (HCC) 12/10/2023   Adrenal adenoma, left 12/03/2023   Protein-calorie malnutrition, severe 12/02/2023   Malnutrition of moderate degree 11/30/2023   Shock (HCC) 11/29/2023   Bowel obstruction (HCC) 11/28/2023   Sepsis (HCC) 03/20/2022   Pressure injury of skin 03/28/2021   SIRS (systemic inflammatory response syndrome) (HCC) 03/24/2021   Type 2 diabetes mellitus with hyperglycemia (HCC) 03/24/2021   Nausea vomiting  and diarrhea 03/24/2021   Recurrent pulmonary embolism (HCC) 03/24/2021   Chest pain 03/24/2021   CKD (chronic kidney disease), stage III (HCC) 05/17/2016   Type 2 diabetes mellitus with vascular disease (HCC) 05/17/2016   Essential hypertension 05/17/2016   DVT (deep venous thrombosis) (HCC) 05/16/2016   History of CVA (cerebrovascular accident) 07/30/2013   TIA (transient ischemic attack) 07/30/2013   Posterior circulation stroke of uncertain pathology (HCC) 07/30/2013   PCP:  Freddrick No Pharmacy:   [facility] [address] [place] - [address] [place] [address] Phone: [phone] Fax: [phone]     Social Drivers of Health (SDOH) Social History: SDOH Screenings   Food Insecurity: No Food Insecurity (12/11/2023)  Housing: Low Risk  (12/11/2023)  Transportation Needs: No Transportation Needs (12/11/2023)  Utilities: Not At Risk (12/11/2023)  Depression (PHQ2-9): High Risk (05/01/2021)  Tobacco Use: High Risk (12/09/2023)   SDOH Interventions:     Readmission Risk Interventions    03/22/2022    4:41 PM  Readmission Risk Prevention Plan  Post Dischage Appt Complete  Medication Screening Complete  Transportation Screening Complete

## 2023-12-16 NOTE — Progress Notes (Signed)
 Subjective: Abdominal soreness is stable. Tolerating PO. +flatus, reports a BM yesterday.   States she is breathing better compared to yesterday. Now on room air Still with intermittent soft BPs in the 80's.  Objective: Vital signs in last 24 hours: Temp:  [97.8 F (36.6 C)-98.6 F (37 C)] 98 F (36.7 C) (12/08 0731) Pulse Rate:  [91-101] 95 (12/08 0326) Resp:  [13-32] 17 (12/08 0731) BP: (63-113)/(34-78) 109/64 (12/08 0731) SpO2:  [91 %-99 %] 98 % (12/08 0326) Weight:  [67.7 kg] 67.7 kg (12/08 0326) Last BM Date : 12/14/23  Intake/Output from previous day: 12/07 0701 - 12/08 0700 In: 174.6 [I.V.:74.7; IV Piggyback:99.9] Out: 1000 [Urine:1000] Intake/Output this shift: No intake/output data recorded.  PE: Gen: chronically ill appearing female Heart: RRR Lungs: normal effort, no wheezing at present Abd: soft,appropriately ttp, ND, dressing c/d/i,  Some diffuse tenderness, but no guarding/peritonitis.  Lab Results:  Recent Labs    12/15/23 0320 12/16/23 0317  WBC 10.9* 11.8*  HGB 11.4* 11.6*  HCT 34.4* 35.1*  PLT 263 259   BMET Recent Labs    12/14/23 0316 12/14/23 1358 12/15/23 0948  NA 140  --  139  K 2.7* 4.1 4.6  CL 104  --  107  CO2 27  --  24  GLUCOSE 122*  --  169*  BUN <5*  --  <5*  CREATININE 0.72  --  0.78  CALCIUM  7.2*  --  7.7*   PT/INR No results for input(s): LABPROT, INR in the last 72 hours. CMP     Component Value Date/Time   NA 139 12/15/2023 0948   NA 134 05/01/2021 1532   K 4.6 12/15/2023 0948   CL 107 12/15/2023 0948   CO2 24 12/15/2023 0948   GLUCOSE 169 (H) 12/15/2023 0948   BUN <5 (L) 12/15/2023 0948   BUN 8 05/01/2021 1532   CREATININE 0.78 12/15/2023 0948   CALCIUM  7.7 (L) 12/15/2023 0948   PROT 4.5 (L) 12/10/2023 0913   PROT 7.4 05/01/2021 1532   ALBUMIN  1.9 (L) 12/14/2023 1358   ALBUMIN  4.4 05/01/2021 1532   AST 11 (L) 12/10/2023 0913   ALT 11 12/10/2023 0913   ALKPHOS 57 12/10/2023 0913   BILITOT  0.4 12/10/2023 0913   BILITOT 0.3 05/01/2021 1532   GFRNONAA >60 12/15/2023 0948   GFRAA >60 02/28/2019 2041   Lipase     Component Value Date/Time   LIPASE 21 12/09/2023 2243       Studies/Results: CT CHEST ABDOMEN PELVIS W CONTRAST Result Date: 12/15/2023 EXAM: CT CHEST, ABDOMEN AND PELVIS WITH CONTRAST 12/15/2023 10:18:30 PM TECHNIQUE: CT of the chest, abdomen and pelvis was performed with the administration of 75 mL of iohexol  (OMNIPAQUE ) 350 MG/ML injection. Multiplanar reformatted images are provided for review. Automated exposure control, iterative reconstruction, and/or weight based adjustment of the mA/kV was utilized to reduce the radiation dose to as low as reasonably achievable. COMPARISON: 12/10/2023 CLINICAL HISTORY: Acute respiratory failure. s/p laparotomy with bowel resection, pancolitis. FINDINGS: CHEST: MEDIASTINUM AND LYMPH NODES: Heart and pericardium are unremarkable. Calcifications in the left main and left anterior descending coronary arteries. Scattered aortic atherosclerosis. The central airways are clear. No mediastinal, hilar or axillary lymphadenopathy. LUNGS AND PLEURA: Moderate emphysema. Small bilateral pleural effusions, new since prior study. Compressive atelectasis in the lower lobes. New interstitial thickening and ground-glass opacities posteriorly in the right upper lobe could reflect pneumonia. No pneumothorax. ABDOMEN AND PELVIS: LIVER: The liver is unremarkable. GALLBLADDER AND  BILE DUCTS: Gallstone within the gallbladder fundus. No biliary ductal dilatation. SPLEEN: No acute abnormality. PANCREAS: No acute abnormality. ADRENAL GLANDS: Right adrenal gland normal. Complex mixed solid and cystic mass in the left adrenal gland with scattered calcifications measures 5.7 x 4.9 cm, stable. KIDNEYS, URETERS AND BLADDER: No stones in the kidneys or ureters. No hydronephrosis. No perinephric or periureteral stranding. Urinary bladder is unremarkable. GI AND BOWEL:  Stomach demonstrates no acute abnormality. There is no bowel obstruction. REPRODUCTIVE ORGANS: No acute abnormality. PERITONEUM AND RETROPERITONEUM: Moderate free fluid in the abdomen and pelvis. No free air. VASCULATURE: Aorta is normal in caliber. Diffuse aortoiliac atherosclerosis. ABDOMINAL AND PELVIS LYMPH NODES: No lymphadenopathy. BONES AND SOFT TISSUES: Open midline surgical incision. Diffuse edema throughout the subcutaneous soft tissues compatible with anasarca. No acute osseous abnormality. IMPRESSION: 1. New interstitial thickening and ground-glass opacities in the posterior right upper lobe, concerning for pneumonia. 2. Small bilateral pleural effusions, new since prior study. 3. Coronary artery disease, aortic atherosclerosis. 4. Moderate emphysema. 5. Moderate ascites, increasing since prior study. . 6. Complex mixed solid and cystic left adrenal mass with calcifications, stable. As recommended previously, surgical consultation recommended. 7. Anasarca. Electronically signed by: Franky Crease MD 12/15/2023 10:31 PM EST RP Workstation: [id number]   DG CHEST PORT 1 VIEW Result Date: 12/15/2023 EXAM: 1 VIEW(S) XRAY OF THE CHEST 12/15/2023 10:09:40 AM COMPARISON: 12/12/2023 CLINICAL HISTORY: 200808 Hypoxia 200808 FINDINGS: LINES, TUBES AND DEVICES: Enteric tube removed. Left chest cardiac loop recorder or leadless pacemaker noted. LUNGS AND PLEURA: Diffuse interstitial opacities. Increased right perihilar and basilar heterogeneous opacities. Increased small right pleural effusion. Unchanged trace left pleural effusion. No pneumothorax. 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 No acute abnormality of the cardiac and mediastinal silhouettes. BONES AND SOFT TISSUES: No acute osseous abnormality. IMPRESSION: 1. Increased right perihilar and basilar heterogeneous opacities. 2. Increased small right pleural effusion. 3. Unchanged trace left pleural effusion.  Electronically signed by: Evalene Coho MD 12/15/2023 10:33 AM EST RP Workstation: [id number]     Anti-infectives: Anti-infectives (From admission, onward)    Start     Dose/Rate Route Frequency Ordered Stop   12/16/23 1000  azithromycin  (ZITHROMAX ) tablet 500 mg        500 mg Oral Daily 12/16/23 0536 12/21/23 0959   12/10/23 2200  cefTRIAXone  (ROCEPHIN ) 2 g in sodium chloride  0.9 % 100 mL IVPB        2 g 200 mL/hr over 30 Minutes Intravenous Every 24 hours 12/10/23 0303     12/10/23 1000  metroNIDAZOLE  (FLAGYL ) IVPB 500 mg        500 mg 100 mL/hr over 60 Minutes Intravenous Every 12 hours 12/10/23 0303     12/10/23 0100  cefTRIAXone  (ROCEPHIN ) 2 g in sodium chloride  0.9 % 100 mL IVPB        2 g 200 mL/hr over 30 Minutes Intravenous  Once 12/10/23 0058 12/10/23 0154   12/10/23 0100  metroNIDAZOLE  (FLAGYL ) IVPB 500 mg        500 mg 100 mL/hr over 60 Minutes Intravenous  Once 12/10/23 0058 12/10/23 0302        Assessment/Plan S/p ex lap with SBR for internal hernia with ischemic bowel by Dr. Sebastian on 11/21 now with ileus and pancolitis - C.diff and GI panel negative  - GI consulted 12/5, started lomotil , signed off 12/6 - agree with completing course of abx for pancolitis  - diet as tolerated - continue BID WTD to midline  -  CT yesterday shows new pneumonia, no bowel obstruction, some ascites  HCAP - RUL PNA on CT 12/7 per [facility]  FEN - soft diet VTE - heparin  gtt - ok to transition to DOAC from abdominal standpoint but putting back on hep gtt with respiratory change  ID - Rocephin /Flagyl   --per [facility]-- B PEs - on heparin  per medicine DM H/o CVA HTN   LOS: 6 days    Almarie GORMAN Pringle , [facility] 12/16/2023, 8:20 AM Please see Amion for pager number during day hours 7:00am-4:30pm or 7:00am -11:30am on weekends

## 2023-12-17 ENCOUNTER — Other Ambulatory Visit (HOSPITAL_COMMUNITY): Payer: Self-pay

## 2023-12-17 LAB — CBC
HCT: 35.4 % — ABNORMAL LOW (ref 36.0–46.0)
Hemoglobin: 11.5 g/dL — ABNORMAL LOW (ref 12.0–15.0)
MCH: 31.1 pg (ref 26.0–34.0)
MCHC: 32.5 g/dL (ref 30.0–36.0)
MCV: 95.7 fL (ref 80.0–100.0)
Platelets: 225 K/uL (ref 150–400)
RBC: 3.7 MIL/uL — ABNORMAL LOW (ref 3.87–5.11)
RDW: 14.4 % (ref 11.5–15.5)
WBC: 9.6 K/uL (ref 4.0–10.5)
nRBC: 0 % (ref 0.0–0.2)

## 2023-12-17 LAB — GLUCOSE, CAPILLARY
Glucose-Capillary: 151 mg/dL — ABNORMAL HIGH (ref 70–99)
Glucose-Capillary: 164 mg/dL — ABNORMAL HIGH (ref 70–99)
Glucose-Capillary: 204 mg/dL — ABNORMAL HIGH (ref 70–99)

## 2023-12-17 LAB — HEPARIN LEVEL (UNFRACTIONATED): Heparin Unfractionated: 0.28 [IU]/mL — ABNORMAL LOW (ref 0.30–0.70)

## 2023-12-17 MED ORDER — APIXABAN 5 MG PO TABS: 5.0000 mg | ORAL_TABLET | Freq: Two times a day (BID) | ORAL | Status: DC

## 2023-12-17 MED ORDER — AZITHROMYCIN 500 MG PO TABS
500.0000 mg | ORAL_TABLET | Freq: Every day | ORAL | 0 refills | Status: AC
  Filled 2023-12-17: qty 3, 3d supply, fill #0

## 2023-12-17 MED ORDER — APIXABAN 5 MG PO TABS
10.0000 mg | ORAL_TABLET | Freq: Two times a day (BID) | ORAL | Status: DC
  Administered 2023-12-17: 10 mg via ORAL
  Filled 2023-12-17: qty 2

## 2023-12-17 MED ORDER — APIXABAN 5 MG PO TABS
ORAL_TABLET | ORAL | 0 refills | Status: AC
  Filled 2023-12-17: qty 194, 90d supply, fill #0

## 2023-12-17 NOTE — Progress Notes (Addendum)
 Physical Therapy Treatment Patient Details Name: [name] MRN: [id number] DOB: [birth year] Today's Date: 12/17/2023   History of Present Illness 58 y.o. F adm 12/1 for  ileus and pancolitis. Recent admission 11/21 for exp lap with small bowel resection. PMH: recurrent PE, CVA with Rt sided deficits, DVT, HTN    PT Comments  Pt resting in bed on arrival, agreeable to session with pt demonstrating steady progress towards acute goals. Pt slightly impulsive throughout session, requiring cues for safety and attention to lines/line management. Pt demonstrating bed mobility, transfers sit<>stand and gait with RW for support with grossly supervision for safety. Pt maintaining SpO2 >92% on RA throughout mobility. Pt was educated on continued walker use to maximize functional independence, safety, and decrease risk for falls as well as appropriate activity progression and importance of continued mobility with pt verbalizing understanding of all. Pt continues to benefit from skilled PT services to progress toward functional mobility goals.     If plan is discharge home, recommend the following: Assist for transportation;Help with stairs or ramp for entrance;A little help with bathing/dressing/bathroom;Assistance with cooking/housework   Can travel by private vehicle        Equipment Recommendations  None recommended by PT    Recommendations for Other Services       Precautions / Restrictions Precautions Precautions: Fall Recall of Precautions/Restrictions: Intact Restrictions Weight Bearing Restrictions Per Provider Order: No     Mobility  Bed Mobility Overal bed mobility: Modified Independent             General bed mobility comments: Extra time in/out of bed, no assist.    Transfers Overall transfer level: Needs assistance Equipment used: Rolling walker (2 wheels) Transfers: Sit to/from Stand Sit to Stand: Supervision           General transfer comment: Supervision  for safety, no physical assist to rise. Cues for hand placement. Steady with RW upon rising.    Ambulation/Gait Ambulation/Gait assistance: Supervision Gait Distance (Feet): 350 Feet Assistive device: Rolling walker (2 wheels) Gait Pattern/deviations: Trunk flexed, Step-to pattern, Decreased stride length, Wide base of support, Drifts right/left Gait velocity: dec     General Gait Details: Minor drift but self corrects No overt buckling or LOB but reliant on RW for support. Educated on safety and use. Feels confident with device.   Stairs             Wheelchair Mobility     Tilt Bed    Modified Rankin (Stroke Patients Only)       Balance Overall balance assessment: Mild deficits observed, not formally tested                                          Communication Communication Communication: No apparent difficulties  Cognition Arousal: Alert Behavior During Therapy: WFL for tasks assessed/performed   PT - Cognitive impairments: No apparent impairments                         Following commands: Intact      Cueing Cueing Techniques: Verbal cues  Exercises      General Comments General comments (skin integrity, edema, etc.): SpO2 93-96% on RA throughout session      Pertinent Vitals/Pain Pain Assessment Pain Assessment: Faces Faces Pain Scale: Hurts a little bit Pain Location: abdomen Pain Descriptors / Indicators: Sore Pain Intervention(s):  Monitored during session, Limited activity within patient's tolerance    Home Living                          Prior Function            PT Goals (current goals can now be found in the care plan section) Acute Rehab PT Goals Patient Stated Goal: return home when medically ready PT Goal Formulation: With patient Time For Goal Achievement: 12/28/23 Progress towards PT goals: Progressing toward goals    Frequency    Min 2X/week      PT Plan      Co-evaluation               AM-PAC PT 6 Clicks Mobility   Outcome Measure  Help needed turning from your back to your side while in a flat bed without using bedrails?: None Help needed moving from lying on your back to sitting on the side of a flat bed without using bedrails?: None Help needed moving to and from a bed to a chair (including a wheelchair)?: A Little Help needed standing up from a chair using your arms (e.g., wheelchair or bedside chair)?: A Little Help needed to walk in hospital room?: A Little Help needed climbing 3-5 steps with a railing? : A Little 6 Click Score: 20    End of Session   Activity Tolerance: Patient tolerated treatment well Patient left: with call bell/phone within reach;in chair Nurse Communication: Mobility status PT Visit Diagnosis: Other abnormalities of gait and mobility (R26.89);Unsteadiness on feet (R26.81);Muscle weakness (generalized) (M62.81);Difficulty in walking, not elsewhere classified (R26.2)     Time: 8779-8758 PT Time Calculation (min) (ACUTE ONLY): 21 min  Charges:    $Gait Training: 8-22 mins PT General Charges $$ ACUTE PT VISIT: 1 Visit                     Armonii Sieh R. PTA Acute Rehabilitation Services Office: [phone]   [name] CHRISTELLA Boor 12/17/2023, 1:44 PM

## 2023-12-17 NOTE — Progress Notes (Addendum)
 PHARMACY - ANTICOAGULATION CONSULT NOTE  Pharmacy Consult for heparin  ->apixaban  Indication: pulmonary embolus  Allergies  Allergen Reactions   Amoxicillin Hives   Aspirin Hives   Codeine Hives   Penicillins Hives    Vital Signs: Temp: 98.2 F (36.8 C) (12/09 0348) Temp Source: Oral (12/09 0348) BP: 106/72 (12/09 0348) Pulse Rate: 88 (12/09 0348)  Labs: Recent Labs    12/15/23 0320 12/15/23 0948 12/16/23 0317 12/17/23 0358  HGB 11.4*  --  11.6* 11.5*  HCT 34.4*  --  35.1* 35.4*  PLT 263  --  259 225  HEPARINUNFRC 0.49  --  0.30 0.28*  CREATININE  --  0.78  --   --     Estimated Creatinine Clearance: 70.9 mL/min (by C-G formula based on SCr of 0.78 mg/dL).   Medical History: Past Medical History:  Diagnosis Date   Acid reflux    Adrenal benign tumor    Apparently enlarging and patient is supposed to have it taken out   Anxiety    Depression    Diabetes mellitus without complication (HCC)    DVT (deep vein thrombosis) in pregnancy    Hypertension    Nausea vomiting and diarrhea 03/24/2021   PE (pulmonary embolism)    Stroke [facility])      Assessment: 58 y/o F 10 days post-op from small bowel resection presents to the ED with increased N/V and surgical site drainage. CT abdomen revealed a possible RLL pulmonary artery filling defect. A CT Angio study was then completed which showed bilateral PE.  CBC stable.  Heparin  level down to slightly subtherapeutic (0.28) on heparin  drip rate 1450 uts/hr  No bleeding noted and no issues with infusion. CBC stable.  Goal of Therapy:  Heparin  level 0.3-0.7 units/ml Monitor platelets by anticoagulation protocol: Yes   Plan:  Increase heparin  to 1550 units / hr Will f/u 6 hr heparin  level F/u transition to Eliquis   Vito Ralph, PharmD, BCPS Please see amion for complete clinical pharmacist phone list 12/17/2023 7:38 AM   ADDENDUM (1220) Heparin  being transitioned to Eliquis . Noted Eliquis  copay  $4  Plan: Apixaban  10mg  po BID x 7 days then on 12/16 transition to 5mg  po BID Will educate pt prior to discharge  Vito Ralph, PharmD, BCPS Please see amion for complete clinical pharmacist phone list 12/17/2023 12:21 PM

## 2023-12-17 NOTE — Progress Notes (Signed)
 Mobility Specialist Progress Note;   12/17/23 1008  Mobility  Activity Ambulated with assistance;Pivoted/transferred from chair to bed  Level of Assistance Standby assist, set-up cues, supervision of patient - no hands on  Assistive Device Front wheel walker  Distance Ambulated (ft) 350 ft  Activity Response Tolerated well  Mobility Referral Yes  Mobility visit 1 Mobility  Mobility Specialist Start Time (ACUTE ONLY) 1008  Mobility Specialist Stop Time (ACUTE ONLY) 1023  Mobility Specialist Time Calculation (min) (ACUTE ONLY) 15 min   Pt in chair upon arrival, agreeable to mobility. Required no physical assistance during ambulation, SV for safety. VSS on RA throughout and no c/o when asked. Requested to return back to bed. Pt left with all needs met, call bell in reach.   Lauraine Erm Mobility Specialist Please contact via SecureChat or [employer] [phone]

## 2023-12-17 NOTE — Progress Notes (Signed)
 DISCHARGE NOTE HOME MARVINE ENCALADE to be discharged Home per MD order. Discussed prescriptions and follow up appointments with the patient. Prescriptions given to patient; medication list explained in detail. Patient verbalized understanding.  Reviewed wound care and provided patient with supplies for a few days.  Skin clean, dry and intact without evidence of skin break down, no evidence of skin tears noted. IV catheter discontinued intact. Site without signs and symptoms of complications. Dressing and pressure applied. Pt denies pain at the site currently. No complaints noted.  See LDA for ABD surgical incision and wound at discharge Patient free of lines, drains, and wounds.   An After Visit Summary (AVS) was printed and given to the patient.  TOC meds delivered to patient Patient escorted via wheelchair,to discharge lounge to wait for taxi    Peyton SHAUNNA Pepper, RN

## 2023-12-17 NOTE — TOC Transition Note (Signed)
 Transition of Care (TOC) - Discharge Note Rayfield Gobble RN, BSN Inpatient Care Management Unit 4E- RN Case Manager See Treatment Team for direct phone #   Patient Details  Name: [name] MRN: [id number] Date of Birth: 06/24/1965  Transition of Care [facility]) CM/SW Contact:  Gobble Rayfield Hurst, RN Phone Number: 12/17/2023, 2:15 PM   Clinical Narrative:    Pt stable for transition home today, note recs from therapy for outpt PT follow up.   CM spoke with pt at bedside to discuss outpt needs- pt voiced she would like to have outpt referral for PT- needs to speak with her DIL regarding transportation. Pt agreeable to referral- choice offered for location- after discussion pt has decided on [place] location as it is closest to her home.   Pt voiced she has needed DME at home- no new DME needs noted. Per mobility note today pt does not need home 02.   Family to transport home, bedside RN will provide education on wound care needs prior to discharge.   Referral made to [facility] outpt for PT follow up per MD verbal order- Ref# [id number]  IP CM interventions have been completed no further needs noted.   Final next level of care: OP Rehab Barriers to Discharge: Barriers Resolved   Patient Goals and CMS Choice Patient states their goals for this hospitalization and ongoing recovery are:: return home and recover   Choice offered to / list presented to : NA      Discharge Placement               Home        Discharge Plan and Services Additional resources added to the After Visit Summary for   In-house Referral: Clinical Social Work Discharge Planning Services: CM Consult            DME Arranged: N/A DME Agency: NA       HH Arranged: NA HH Agency: NA        Social Drivers of Health (SDOH) Interventions SDOH Screenings   Food Insecurity: No Food Insecurity (12/11/2023)  Housing: Low Risk  (12/11/2023)  Transportation Needs: No Transportation  Needs (12/11/2023)  Utilities: Not At Risk (12/11/2023)  Depression (PHQ2-9): High Risk (05/01/2021)  Tobacco Use: High Risk (12/09/2023)     Readmission Risk Interventions    12/17/2023    2:15 PM 03/22/2022    4:41 PM  Readmission Risk Prevention Plan  Post Dischage Appt  Complete  Medication Screening  Complete  Transportation Screening Complete Complete  PCP or Specialist Appt within 5-7 Days Complete   Home Care Screening Complete   Medication Review (RN CM) Complete

## 2023-12-17 NOTE — Discharge Summary (Signed)
 Physician Discharge Summary   Patient: [name] MRN: [id number] DOB: Oct 17, 1965  Admit date:     12/09/2023  Discharge date: 12/17/23  Discharge Physician: Elgin Lam, MD   PCP: Pcp, No   Recommendations at discharge:  PCP visit for hospital follow-up General surgery visit for hospital follow-up  Discharge Diagnoses: Principal Problem:   Partial small bowel obstruction (HCC) Active Problems:   SIRS (systemic inflammatory response syndrome) (HCC)   Recurrent pulmonary embolism (HCC)   History of DVT (deep vein thrombosis)   Hypokalemia   Hypomagnesemia   Hypocalcemia   Type 2 diabetes mellitus with hyperglycemia (HCC)   History of CVA (cerebrovascular accident)   Left adrenal mass   Tobacco abuse   NSVT (nonsustained ventricular tachycardia) (HCC)   Ileus, postoperative (HCC)   Diarrhea  Resolved Problems:   * No resolved hospital problems. *  Hospital Course: [name] is a 58 y.o. female with a history of hypertension, diabetes mellitus type 2, DVT, PE, left adrenal mass, small bowel obstruction status post partial bowel resection.  Patient presented secondary to abdominal pain, diarrhea, vomiting with concern for obstruction and found to have evidence of ileus in addition to pancolitis concern for infection.  Patient met sepsis criteria on admission.  General surgery consulted.  She was started empirically on antibiotics and an NG tube was placed for management. NG tube eventually successfully removed and patient's diet was advanced. Patient was also found to have an acute PE and DVT in addition to an episode of significant respiratory distress and respiratory failure. Patient started on Heparin  IV for her DVT/PE in addition to Lasix  for respiratory failure possibly related to fluid overload. Discharge on Eliquis .  Assessment and Plan:  Severe sepsis Present on admission.  Source is GI with evidence of colitis on CT imaging.  Patient started empirically on  ceftriaxone  and Flagyl .  GI panel and C. difficile negative.  Blood culture obtained on admission is no growth to date. Leukocytosis improved. Afebrile. - Continue antibiotics   Pancolitis CT imaging on admission significant for evidence of pancolitis greatest about the sigmoid colon and rectum.  Patient started empirically on ceftriaxone  and Flagyl .  GI pathogen panel and C. difficile testing negative. WBC shows good response to current antibiotic regimen. GI consulted and recommended no change in management. Abdominal pain worsened today, per patient. - Continue ceftriaxone  and Flagyl    Ileus Likely related to recent abdominal surgery.  Diagnosed on CT imaging.  General surgery consulted.  NG tube placed on 12/2 for management and removed on 12/5. Diet advanced to a soft diet. Recommendation to follow-up with general surgery as an outpatient.   Acute recurrent pulmonary embolism Diagnosed on admission via CTA chest which was significant for an acute segmental and subsegmental pulm emboli in the bilateral lower lobes and left upper lobe pulmonary arteries and without evidence of right heart strain.  Hemodynamically stable.  Patient started on heparin  IV for management and transitioned to Eliquis  prior to discharge.   Acute respiratory failure with hypoxia Patient appears to have developed acute pulmonary edema of unclear etiology. Could possibly also have had an aspiration event. Patient with hypoxia and respiratory distress requiring application of supplemental oxygen on 12/4. Chest x-ray revealed concern for possible pulmonary edema and BNP elevated at 721.9. Transthoracic Echocardiogram this admission shows normal LV and RV function. No associated arrhythmia noted. Appears to be improved with Lasix . Oxygen weaned to room air. Patient had a recurrent episode on 12/7. Repeat BNP is improved  down to 270. Chest x-ray with a worsening right infiltrate. Symptoms improved with transition to fowler  position. CT chest suggests pneumonia. Patient also received Lasix . Patient weaned to room air prior to discharge.   Right upper lobe pneumonia Noted on CT imaging from 12/7. Patient with non-productive cough. Afebrile. Patient is already on Ceftriaxone  and azithromycin  added. Discharge with azithromycin .   Metabolic acidosis Likely related to severe hypoxia overnight. CO2 down to 12. Resolved with sodium bicarbonate  IV fluids.   Acute DVT Lower extremity venous duplex scan significant for an acute DVT involving the common femoral vein, right femoral vein and right popliteal vein. Likely provoked, related to recent surgery. Patient managed on Heparin  IV and transitioned to Eliquis  prior to discharge.   Anasarca Noted on CT imaging. Patient given Lasix  IV. Appears euvolemic prior to discharge. Patient will need to continue adequate nutritional intake.   Ascites Noted on CT imaging. No evidence of liver disease. Unclear if related to recent surgery, although volume is increasd. Lasix  IV started for anasarca. Patient has worsening abdominal pain and may benefit from a paracentesis if no improvement.   Hypokalemia Recurrent. Likely related to GI losses. Complicated by associated hypomagnesemia. Resolved with supplementation.   Hypocalcemia Corrected calcium  from 12/2 of about 8.4.   Hypomagnesemia Patient given magnesium  supplementation. Resolved.   Nonsustained ventricular tachycardia Noted. Possibly related to electrolyte imbalances. Patient started on metoprolol  which is now discontinued. Resolved.   Diabetes mellitus type 2 Uncontrolled with hyperglycemia based on hemoglobin A1c of 9.7%. patient started on SSI. Continue home regimen on discharge, but recommend patient start on additional medication for management of diabetes.   History of CVA Noted.  No residual symptoms.  He does not appear patient is on medication management.   Left adrenal mass Noted on CT imaging.   Recommendation for surgical consultation and medical workup. Paitent has already been referred to endocrinology.   Tobacco abuse Patient declined nicotine  patch.   Consultants:  General surgery Gastroenterology   Procedures:  None  Disposition: Home Diet recommendation: Soft diet   DISCHARGE MEDICATION: Allergies as of 12/17/2023       Reactions   Amoxicillin Hives   Aspirin Hives   Codeine Hives   Penicillins Hives        Medication List     STOP taking these medications    ibuprofen  400 MG tablet Commonly known as: ADVIL        TAKE these medications    acetaminophen  500 MG tablet Commonly known as: TYLENOL  Take 2 tablets (1,000 mg total) by mouth every 6 (six) hours as needed for mild pain (pain score 1-3).   apixaban  5 MG Tabs tablet Commonly known as: ELIQUIS  Take 2 tablets (10 mg total) by mouth 2 (two) times daily for 7 days, THEN 1 tablet (5 mg total) 2 (two) times daily. Start taking on: December 17, 2023   azithromycin  500 MG tablet Commonly known as: ZITHROMAX  Take 1 tablet (500 mg total) by mouth daily for 3 days. Start taking on: December 18, 2023   metFORMIN  500 MG tablet Commonly known as: GLUCOPHAGE  Take 1 tablet (500 mg total) by mouth 2 (two) times daily with a meal.   methocarbamol  750 MG tablet Commonly known as: ROBAXIN  Take 750 mg by mouth 4 (four) times daily.   multivitamin with minerals Tabs tablet Take 1 tablet by mouth daily.   ondansetron  4 MG disintegrating tablet Commonly known as: ZOFRAN -ODT Take 1 tablet (4 mg total) by mouth every 8 (eight)  hours as needed for nausea or vomiting.   oxycodone  5 MG capsule Commonly known as: OXY-IR Take 5 mg by mouth every 6 (six) hours as needed for pain.   pantoprazole  40 MG tablet Commonly known as: PROTONIX  Take 1 tablet (40 mg total) by mouth at bedtime.   polyethylene glycol 17 g packet Commonly known as: MIRALAX  / GLYCOLAX  Take 17 g by mouth daily as needed for mild  constipation.        Follow-up Information     Sebastian Moles, MD Follow up on 12/25/2023.   Specialty: General Surgery Why: 11:40am, Arrive 30 minutes prior to your appointment time, Please bring your insurance card and photo ID Contact information: [address] [place] [address] [phone]                Discharge Exam: BP 111/71 (BP Location: Right Arm)   Pulse 88   Temp 98.3 F (36.8 C) (Oral)   Resp 18   Ht 5' 6 (1.676 m)   Wt 64.9 kg   LMP  (LMP Unknown) Comment: a few months  SpO2 98%   BMI 23.09 kg/m   General exam: Appears calm and comfortable. Respiratory system: Clear to auscultation. Respiratory effort normal. Cardiovascular system: S1 & S2 heard, RRR.  Gastrointestinal system: Abdomen is distended, soft and nontender. Normal bowel sounds heard. Central nervous system: Alert and oriented. No focal neurological deficits. Musculoskeletal: No calf tenderness Psychiatry: Judgement and insight appear normal. Mood & affect appropriate.   Condition at discharge: stable  The results of significant diagnostics from this hospitalization (including imaging, microbiology, ancillary and laboratory) are listed below for reference.   Imaging Studies: CT CHEST ABDOMEN PELVIS W CONTRAST Result Date: 12/15/2023 EXAM: CT CHEST, ABDOMEN AND PELVIS WITH CONTRAST 12/15/2023 10:18:30 PM TECHNIQUE: CT of the chest, abdomen and pelvis was performed with the administration of 75 mL of iohexol  (OMNIPAQUE ) 350 MG/ML injection. Multiplanar reformatted images are provided for review. Automated exposure control, iterative reconstruction, and/or weight based adjustment of the mA/kV was utilized to reduce the radiation dose to as low as reasonably achievable. COMPARISON: 12/10/2023 CLINICAL HISTORY: Acute respiratory failure. s/p laparotomy with bowel resection, pancolitis. FINDINGS: CHEST: MEDIASTINUM AND LYMPH NODES: Heart and pericardium are unremarkable.  Calcifications in the left main and left anterior descending coronary arteries. Scattered aortic atherosclerosis. The central airways are clear. No mediastinal, hilar or axillary lymphadenopathy. LUNGS AND PLEURA: Moderate emphysema. Small bilateral pleural effusions, new since prior study. Compressive atelectasis in the lower lobes. New interstitial thickening and ground-glass opacities posteriorly in the right upper lobe could reflect pneumonia. No pneumothorax. ABDOMEN AND PELVIS: LIVER: The liver is unremarkable. GALLBLADDER AND BILE DUCTS: Gallstone within the gallbladder fundus. No biliary ductal dilatation. SPLEEN: No acute abnormality. PANCREAS: No acute abnormality. ADRENAL GLANDS: Right adrenal gland normal. Complex mixed solid and cystic mass in the left adrenal gland with scattered calcifications measures 5.7 x 4.9 cm, stable. KIDNEYS, URETERS AND BLADDER: No stones in the kidneys or ureters. No hydronephrosis. No perinephric or periureteral stranding. Urinary bladder is unremarkable. GI AND BOWEL: Stomach demonstrates no acute abnormality. There is no bowel obstruction. REPRODUCTIVE ORGANS: No acute abnormality. PERITONEUM AND RETROPERITONEUM: Moderate free fluid in the abdomen and pelvis. No free air. VASCULATURE: Aorta is normal in caliber. Diffuse aortoiliac atherosclerosis. ABDOMINAL AND PELVIS LYMPH NODES: No lymphadenopathy. BONES AND SOFT TISSUES: Open midline surgical incision. Diffuse edema throughout the subcutaneous soft tissues compatible with anasarca. No acute osseous abnormality. IMPRESSION: 1. New interstitial  thickening and ground-glass opacities in the posterior right upper lobe, concerning for pneumonia. 2. Small bilateral pleural effusions, new since prior study. 3. Coronary artery disease, aortic atherosclerosis. 4. Moderate emphysema. 5. Moderate ascites, increasing since prior study. . 6. Complex mixed solid and cystic left adrenal mass with calcifications, stable. As recommended  previously, surgical consultation recommended. 7. Anasarca. Electronically signed by: Franky Crease MD 12/15/2023 10:31 PM EST RP Workstation: [id number]   DG CHEST PORT 1 VIEW Result Date: 12/15/2023 EXAM: 1 VIEW(S) XRAY OF THE CHEST 12/15/2023 10:09:40 AM COMPARISON: 12/12/2023 CLINICAL HISTORY: 200808 Hypoxia 200808 FINDINGS: LINES, TUBES AND DEVICES: Enteric tube removed. Left chest cardiac loop recorder or leadless pacemaker noted. LUNGS AND PLEURA: Diffuse interstitial opacities. Increased right perihilar and basilar heterogeneous opacities. Increased small right pleural effusion. Unchanged trace left pleural effusion. No pneumothorax. 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 No acute abnormality of the cardiac and mediastinal silhouettes. BONES AND SOFT TISSUES: No acute osseous abnormality. IMPRESSION: 1. Increased right perihilar and basilar heterogeneous opacities. 2. Increased small right pleural effusion. 3. Unchanged trace left pleural effusion. Electronically signed by: Evalene Coho MD 12/15/2023 10:33 AM EST RP Workstation: [id number]   DG Chest Port 1 View Result Date: 12/12/2023 EXAM: 1 VIEW(S) XRAY OF THE CHEST 12/12/2023 04:39:32 AM COMPARISON: 10/17/2023. CTA chest 12/10/2023. CLINICAL HISTORY: 58 year old female. Acute respiratory distress. Recently diagnosed acute pulmonary emboli. FINDINGS: LINES, TUBES AND DEVICES: Satisfactory enteric tube placement into the stomach with the side hole at the level of the gastric body. LUNGS AND PLEURA: New right perihilar opacity. Diffuse interstitial opacities. Blunting of costophrenic angles. Chronic appearing hyperinflation. Acute on chronic diffuse increased pulmonary interstitium. Trace new fluid in the right minor fissure. Colon emphysema demonstrated on recent CTA. No pneumothorax. 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 BONES AND SOFT TISSUES: No acute  osseous abnormality. UPPER ABDOMEN: Negative visible bowel gas pattern. IMPRESSION: 1. Emphysema, hyperinflation with interval diffuse increased pulmonary interstitium and small volume pleural fluid. Favor acute pulmonary edema over viral/atypical respiratory infection. 2. Satisfactory enteric tube position in the stomach. Electronically signed by: Helayne Hurst MD 12/12/2023 05:01 AM EST RP Workstation: [id number]   DG Abd Portable 1V Result Date: 12/11/2023 EXAM: 1 VIEW XRAY OF THE ABDOMEN 12/11/2023 11:54:00 AM COMPARISON: 01/02/2024 CLINICAL HISTORY: 01250 Ileus (HCC) 98749 Ileus (HCC) 01250 FINDINGS: LINES, TUBES AND DEVICES: Enteric tube is in the stomach, advanced from prior study. BOWEL: Nonobstructive bowel gas pattern. Single mildly prominent right lower quadrant small bowel loop. SOFT TISSUES: No opaque urinary calculi. BONES: No acute osseous abnormality. IMPRESSION: 1. No bowel obstruction. 2. Enteric tube tip projects over the stomach, appropriately advanced from the prior study. Electronically signed by: Franky Crease MD 12/11/2023 04:43 PM EST RP Workstation: [id number]   VAS US  LOWER EXTREMITY VENOUS (DVT) Result Date: 12/11/2023  Lower Venous DVT Study Patient Name:  HEILEY SHAIKH  Date of Exam:   12/11/2023 Medical Rec #: [id number]          Accession #:    [id number] Date of Birth: [birth year]          Patient Gender: F Patient Age:   58 years Exam Location:  [facility] Procedure:      VAS US  LOWER EXTREMITY VENOUS (DVT) Referring Phys: MAXIMINO SHARPS --------------------------------------------------------------------------------  Indications: Swelling, and pulmonary embolism. Other Indications: History of DVT/PE on chronic anticoagulation. Risk Factors: Stroke, left adrenal mass, and recent small bowel obstruction with  gangrenous bowel requiring exploratory laparotomy with partial bowel resection. Comparison Study: 10/17/23 - negative Performing Technologist: Ricka Sturdivant-Jones  RDMS, RVT  Examination Guidelines: A complete evaluation includes B-mode imaging, spectral Doppler, color Doppler, and power Doppler as needed of all accessible portions of each vessel. Bilateral testing is considered an integral part of a complete examination. Limited examinations for reoccurring indications may be performed as noted. The reflux portion of the exam is performed with the patient in reverse Trendelenburg.  +---------+---------------+---------+-----------+----------+--------------+ RIGHT    CompressibilityPhasicitySpontaneityPropertiesThrombus Aging +---------+---------------+---------+-----------+----------+--------------+ CFV      Partial        Yes      Yes                  Chronic        +---------+---------------+---------+-----------+----------+--------------+ SFJ      Full                                                        +---------+---------------+---------+-----------+----------+--------------+ FV Prox  None                                                        +---------+---------------+---------+-----------+----------+--------------+ FV Mid   None           No       No                                  +---------+---------------+---------+-----------+----------+--------------+ FV DistalNone                                                        +---------+---------------+---------+-----------+----------+--------------+ PFV      Full                                                        +---------+---------------+---------+-----------+----------+--------------+ POP      None           No       No                                  +---------+---------------+---------+-----------+----------+--------------+ PTV      Full                                                        +---------+---------------+---------+-----------+----------+--------------+ PERO     Full                                                         +---------+---------------+---------+-----------+----------+--------------+  EIV                     Yes      Yes                  Patent         +---------+---------------+---------+-----------+----------+--------------+ CIV                     Yes      Yes                  Patent         +---------+---------------+---------+-----------+----------+--------------+   +---------+---------------+---------+-----------+----------+--------------+ LEFT     CompressibilityPhasicitySpontaneityPropertiesThrombus Aging +---------+---------------+---------+-----------+----------+--------------+ CFV      Full           Yes      Yes                                 +---------+---------------+---------+-----------+----------+--------------+ SFJ      Full                                                        +---------+---------------+---------+-----------+----------+--------------+ FV Prox  Full                                                        +---------+---------------+---------+-----------+----------+--------------+ FV Mid   Full           Yes      Yes                                 +---------+---------------+---------+-----------+----------+--------------+ FV DistalFull                                                        +---------+---------------+---------+-----------+----------+--------------+ PFV      Full                                                        +---------+---------------+---------+-----------+----------+--------------+ POP      Full           Yes      Yes                                 +---------+---------------+---------+-----------+----------+--------------+ PTV      Full                                                        +---------+---------------+---------+-----------+----------+--------------+ PERO  Full                                                         +---------+---------------+---------+-----------+----------+--------------+ EIV                     Yes      Yes                  Patent         +---------+---------------+---------+-----------+----------+--------------+     Summary: RIGHT: - Findings consistent with acute deep vein thrombosis involving the right common femoral vein, right femoral vein, and right popliteal vein.  - No cystic structure found in the popliteal fossa.  LEFT: - There is no evidence of deep vein thrombosis in the lower extremity.  - No cystic structure found in the popliteal fossa.  *See table(s) above for measurements and observations. Electronically signed by Debby Robertson on 12/11/2023 at 4:08:35 PM.    Final    ECHOCARDIOGRAM COMPLETE Result Date: 12/10/2023    ECHOCARDIOGRAM REPORT   Patient Name:   NADEEN SHIPMAN Date of Exam: 12/10/2023 Medical Rec #:  [id number]         Height:       66.0 in Accession #:    [id number]        Weight:       137.6 lb Date of Birth:  1965-10-10         BSA:          1.706 m Patient Age:    58 years          BP:           96/62 mmHg Patient Gender: F                 HR:           103 bpm. Exam Location:  Inpatient Procedure: 2D Echo, Cardiac Doppler and Color Doppler (Both Spectral and Color            Flow Doppler were utilized during procedure). Indications:    Pulmonary Embolus  History:        Patient has prior history of Echocardiogram examinations, most                 recent 03/24/2021. Risk Factors:Diabetes and Hypertension. CKD.  Sonographer:    Philomena Daring Referring Phys: [id number] JUSTIN B HOWERTER IMPRESSIONS  1. Left ventricular ejection fraction, by estimation, is 60 to 65%. The left ventricle has normal function. The left ventricle has no regional wall motion abnormalities. There is mild concentric left ventricular hypertrophy. Left ventricular diastolic parameters were normal.  2. Right ventricular systolic function is normal. The right ventricular size is normal.  3. The mitral  valve is normal in structure. Moderate mitral valve regurgitation. No evidence of mitral stenosis.  4. The aortic valve is normal in structure. Aortic valve regurgitation is not visualized. Aortic valve sclerosis is present, with no evidence of aortic valve stenosis.  5. The inferior vena cava is normal in size with greater than 50% respiratory variability, suggesting right atrial pressure of 3 mmHg. FINDINGS  Left Ventricle: Left ventricular ejection fraction, by estimation, is 60 to 65%. The left ventricle has normal function. The left ventricle has no regional wall motion abnormalities. The left  ventricular internal cavity size was normal in size. There is  mild concentric left ventricular hypertrophy. Left ventricular diastolic parameters were normal. Right Ventricle: The right ventricular size is normal. No increase in right ventricular wall thickness. Right ventricular systolic function is normal. Left Atrium: Left atrial size was normal in size. Right Atrium: Right atrial size was normal in size. Pericardium: There is no evidence of pericardial effusion. Mitral Valve: The mitral valve is normal in structure. Moderate mitral valve regurgitation. No evidence of mitral valve stenosis. Tricuspid Valve: The tricuspid valve is normal in structure. Tricuspid valve regurgitation is mild . No evidence of tricuspid stenosis. Aortic Valve: The aortic valve is normal in structure. Aortic valve regurgitation is not visualized. Aortic valve sclerosis is present, with no evidence of aortic valve stenosis. Pulmonic Valve: The pulmonic valve was normal in structure. Pulmonic valve regurgitation is not visualized. No evidence of pulmonic stenosis. Aorta: The aortic root is normal in size and structure. Venous: The inferior vena cava is normal in size with greater than 50% respiratory variability, suggesting right atrial pressure of 3 mmHg. IAS/Shunts: No atrial level shunt detected by color flow Doppler.  LEFT VENTRICLE PLAX  2D LVIDd:         3.70 cm   Diastology LVIDs:         2.50 cm   LV e' medial:    7.72 cm/s LV PW:         1.10 cm   LV E/e' medial:  24.9 LV IVS:        1.10 cm   LV e' lateral:   8.27 cm/s LVOT diam:     1.72 cm   LV E/e' lateral: 23.2 LV SV:         57 LV SV Index:   34 LVOT Area:     2.32 cm  RIGHT VENTRICLE             IVC RV Basal diam:  2.96 cm     IVC diam: 1.93 cm RV Mid diam:    2.06 cm RV S prime:     18.00 cm/s TAPSE (M-mode): 1.8 cm LEFT ATRIUM             Index        RIGHT ATRIUM           Index LA diam:        3.73 cm 2.19 cm/m   RA Area:     10.80 cm LA Vol (A2C):   45.4 ml 26.62 ml/m  RA Volume:   18.50 ml  10.85 ml/m LA Vol (A4C):   46.1 ml 27.03 ml/m LA Biplane Vol: 48.3 ml 28.32 ml/m  AORTIC VALVE LVOT Vmax:   149.00 cm/s LVOT Vmean:  99.200 cm/s LVOT VTI:    0.246 m  AORTA Ao Root diam: 2.72 cm Ao Asc diam:  3.54 cm MITRAL VALVE                TRICUSPID VALVE MV Area (PHT): 4.21 cm     TR Peak grad:   37.7 mmHg MV Decel Time: 180 msec     TR Vmax:        307.00 cm/s MV E velocity: 192.00 cm/s MV A velocity: 212.00 cm/s  SHUNTS MV E/A ratio:  0.91         Systemic VTI:  0.25 m  Systemic Diam: 1.72 cm Dub Tobb DO Electronically signed by Kardie Tobb DO Signature Date/Time: 12/10/2023/12:16:23 PM    Final    DG Abd Portable 1 View Result Date: 12/10/2023 EXAM: 1 VIEW XRAY OF THE ABDOMEN 12/10/2023 03:30:53 AM COMPARISON: 11/29/2023 CLINICAL HISTORY: NG Tube verification FINDINGS: LINES, TUBES AND DEVICES: Enteric tube in place with side port in the distal esophagus and tip overlying the expected region of the gastric lumen. Loop recorder device overlying the left chest. Midline surgical staples noted. BOWEL: Partially visualized dilated small bowel loops in the upper abdomen. SOFT TISSUES: No opaque urinary calculi. BONES: No acute osseous abnormality. IMPRESSION: 1. Enteric tube in place with side port in the distal esophagus and tip overlying the expected  region of the gastric lumen. Recommended advancement approximately 5 cm. Electronically signed by: Norman Gatlin MD 12/10/2023 03:38 AM EST RP Workstation: [id number]   CT Angio Chest PE W and/or Wo Contrast Result Date: 12/10/2023 EXAM: CTA CHEST 12/10/2023 02:11:50 AM TECHNIQUE: CTA of the chest was performed after the administration of 75 mL of iohexol  (OMNIPAQUE ) 350 MG/ML injection. Multiplanar reformatted images are provided for review. MIP images are provided for review. Automated exposure control, iterative reconstruction, and/or weight based adjustment of the mA/kV was utilized to reduce the radiation dose to as low as reasonably achievable. COMPARISON: CTA chest 11/28/2023. CLINICAL HISTORY: poss filling defect on abdominal CT, rads recommended follow-up im aging. FINDINGS: PULMONARY ARTERIES: Pulmonary arteries are adequately opacified for evaluation. Acute segmental and subsegmental pulmonary emboli in the bilateral lower lobes and left upper lobe pulmonary arteries. The RV measures 3.5 cm and the LV measures 3.9 cm. RV/LV ratio is 0.9. No evidence of right heart strain. Main pulmonary artery is normal in caliber. MEDIASTINUM: The heart and pericardium demonstrate no acute abnormality. There is no acute abnormality of the thoracic aorta. LYMPH NODES: No mediastinal, hilar or axillary lymphadenopathy. LUNGS AND PLEURA: Moderate emphysema. No focal consolidation or pulmonary edema. No evidence of pleural effusion or pneumothorax. UPPER ABDOMEN: Findings in the upper abdomen not substantially changed from same day CT abdomen and pelvis. See that record for details. SOFT TISSUES AND BONES: No acute bone or soft tissue abnormality. IMPRESSION: 1. Acute segmental and subsegmental pulmonary emboli in the bilateral lower lobes and left upper lobe pulmonary arteries. No evidence of right heart strain (RV/LV ratio 0.9). 2. Moderate emphysema. Given that pulmonary emphysema is an independent risk factor for  lung cancer in patients aged 59, consider evaluation for a low-dose CT lung cancer screening program. 3. Critical value/emergent results were called by telephone at the time of interpretation on 12/10/2023 at 2:20 AM to Dr. Jarold, who verbally acknowledged these results. Electronically signed by: Norman Gatlin MD 12/10/2023 02:23 AM EST RP Workstation: [id number]   CT ABDOMEN PELVIS W CONTRAST Result Date: 12/10/2023 EXAM: CT ABDOMEN AND PELVIS WITH CONTRAST 12/10/2023 12:28:18 AM TECHNIQUE: CT of the abdomen and pelvis was performed with the administration of intravenous contrast. 75 mL of iohexol  (OMNIPAQUE ) 350 MG/ML injection was administered. Multiplanar reformatted images are provided for review. Automated exposure control, iterative reconstruction, and/or weight-based adjustment of the mA/kV was utilized to reduce the radiation dose to as low as reasonably achievable. COMPARISON: CT dated 11/28/2023. CLINICAL HISTORY: Abdominal pain, post-op; recent SBO surgery, increased pain and N/V. FINDINGS: LOWER CHEST: Possible filling defect in a right lower lobe pulmonary artery on series 3 image 1. This may be due to artifact versus pulmonary embolism. Dedicated CTA of the chest is recommended. LIVER: Hypoattenuating  focus along the anterior margin of the right hepatic lobe near the falciform ligament may represent focal fat deposition. GALLBLADDER AND BILE DUCTS: Gallbladder is unremarkable. No biliary ductal dilatation. SPLEEN: No acute abnormality. PANCREAS: No acute abnormality. ADRENAL GLANDS: Unchanged heterogeneous left adrenal nodule containing punctate calcifications. KIDNEYS, URETERS AND BLADDER: Cortical renal scarring bilaterally. No stones in the kidneys or ureters. No hydronephrosis. No perinephric or periureteral stranding. Urinary bladder is unremarkable. GI AND BOWEL: Diffuse wall thickening and mucosal hyperenhancement of the colon greatest about the sigmoid colon and rectum. Diffuse  dilation of the small bowel with tapering at the small bowel - small bowel anastomosis in the right lower quadrant. Findings may be due to ileus or partial obstruction at the anastomosis. PERITONEUM AND RETROPERITONEUM: Small volume of abdominal pelvic ascites. No free air. VASCULATURE: Aorta is normal in caliber. LYMPH NODES: No lymphadenopathy. REPRODUCTIVE ORGANS: No acute abnormality. BONES AND SOFT TISSUES: No acute osseous abnormality. No focal soft tissue abnormality. IMPRESSION: 1. Possible filling defect in a right lower lobe pulmonary artery, potentially representing artifact versus pulmonary embolism. Dedicated CTA of the chest is recommended. 2. Diffuse dilation of the small bowel with tapering at the small bowel-small bowel anastomosis in the right lower quadrant, possibly due to ileus or partial obstruction at the anastomosis. 3. Pancolitis greatest about the sigmoid colon and rectum, favored infectious / inflammatory. 4. Small volume of abdominal and pelvic ascites. 5. Large left adrenal heterogeneous mass. As recommended previously, surgical consultation and biochemical evaluation for functioning adrenal region such as pheochromocytoma is recommended. Electronically signed by: Norman Gatlin MD 12/10/2023 12:51 AM EST RP Workstation: [id number]   DG Abd 1 View Result Date: 11/29/2023 EXAM: 1 VIEW XRAY OF THE ABDOMEN 11/29/2023 01:35:34 AM COMPARISON: None available. CLINICAL HISTORY: Suture check FINDINGS: LINES, TUBES AND DEVICES: Enteric tube partially visualized coiled within the stomach. Foley catheter in place overlying the expected location of the urinary bladder. No unexpected radiopaque foreign body identified within the abdomen and pelvis. Anastomotic staple line noted within the right lower quadrant. BOWEL: Multiple loops of dilated small bowel compatible with small bowel obstruction. Multiple colonic diverticula noted. SOFT TISSUES: Pneumoperitoneum, likely postsurgical. No opaque  urinary calculi. BONES: No acute osseous abnormality. IMPRESSION: 1. Small bowel obstruction. 2. Pneumoperitoneum, likely postsurgical. 3. Multiple colonic diverticula without evidence of diverticulitis. 4. I personally called these findings to the operating room at 1:48 am Electronically signed by: Dorethia Molt MD 11/29/2023 01:50 AM EST RP Workstation: [id number]   CT ABDOMEN PELVIS W CONTRAST Result Date: 11/28/2023 EXAM: CTA CHEST PE WITH AND WITHOUT CONTRAST CT ABDOMEN AND PELVIS WITH AND WITHOUT CONTRAST 11/28/2023 09:45:00 PM TECHNIQUE: CTA of the chest was performed after the administration of 75 mL of iohexol  (OMNIPAQUE ) 350 MG/ML injection. Multiplanar reformatted images are provided for review. MIP images are provided for review. CT of the abdomen and pelvis was performed with and without the administration of intravenous contrast. Automated exposure control, iterative reconstruction, and/or weight based adjustment of the mA/kV was utilized to reduce the radiation dose to as low as reasonably achievable. COMPARISON: None available. CLINICAL HISTORY: Known history of PE. Tachycardia with chest pain. FINDINGS: CHEST: PULMONARY ARTERIES: Pulmonary arteries are adequately opacified for evaluation. No intraluminal filling defect to suggest pulmonary embolism. Main pulmonary artery is normal in caliber. MEDIASTINUM: Mild coronary artery calcification. Global cardiac size within normal limits. No pericardial effusion. No mediastinal lymphadenopathy. Mild atherosclerotic calcification within the thoracic aorta. No aortic aneurysm. LUNGS AND PLEURA: Moderate emphysema. No focal consolidation  or pulmonary edema. No pleural effusion or pneumothorax. SOFT TISSUES AND BONES: Osseous structures are age appropriate. No acute bone abnormality. No lytic or blastic bone lesion. No acute soft tissue abnormality. 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 No enhancing intrahepatic mass. GALLBLADDER AND BILE DUCTS: Gallbladder is unremarkable. No biliary ductal dilatation. SPLEEN: Spleen demonstrates no acute abnormality. PANCREAS: Pancreas demonstrates no acute abnormality. ADRENAL GLANDS: Heterogeneously enhancing mixed attenuation mass within the left adrenal gland is again identified in keeping with a slow growing adrenal adenoma that are characterized on multiple prior examinations demonstrating interval growth since remote prior examination is now measuring 5.9 cm in greatest dimension (previously measuring 4.3 cm in 2014). Right adrenal gland is unremarkable. KIDNEYS, URETERS AND BLADDER: No stones in the kidneys or ureters. No hydronephrosis. No perinephric or periureteral stranding. Urinary bladder is unremarkable. GI AND BOWEL: There is an internal hernia present with severe extrinsic compression of several large jejunal veins and marked mass effect on the superior mesenteric vein at the point of entry within the hernia which is essentially at the mesenteric root. 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 The herniated bowel involves almost the entire length of small bowel which is markedly dilated and fluid filled. There is, additionally, evidence of mesenteric ischemia involving at least 1 loop of bowel within the left upper quadrant best seen on coronal image 62 and axial image 36. 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 The stomach is fluid-filled and distended with fluid within the distal esophagus in keeping with changes of gastroesophageal reflux. Mild sigmoid diverticulosis. The large bowel is otherwise unremarkable. Appendix normal. REPRODUCTIVE: Reproductive organs are unremarkable. PERITONEUM AND RETROPERITONEUM: Mild ascites. No gross free intraperitoneal gas. LYMPH NODES: No lymphadenopathy. BONES  AND SOFT TISSUES: Osseous structures are age appropriate. No acute bone abnormality. No lytic or blastic bone lesion. No focal soft tissue abnormality. Mild aortoiliac atherosclerotic calcification. No aortic aneurysm. IMPRESSION: 1. No pulmonary embolism. 2. 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 While a single loop of bowel appears infarcted, the vast majority of small bowel appears viable at this point. As noted above, the herniated bowel involves nearly the entire length of small bowel. Emergent surgical evaluation recommended. 3. Moderate pulmonary emphysema. Given emphysema as an independent lung cancer risk factor, consider evaluation for low-dose CT lung cancer screening if the patient is 59 years old and meets eligibility criteria. 4. Enlarging left adrenal mass now 5.9 cm, previously 4.3 cm in 2014. Recommend surgical consultation and biochemical evaluation for a functioning adrenal lesion (e.g., pheochromocytoma) prior to considering resection. - the critical findings of this study were discussed directly with Dr. Simon by myself at 10:35 pm. Electronically signed by: Dorethia Molt MD 11/28/2023 10:38 PM EST RP Workstation: [id number]   CT Angio Chest PE W and/or Wo Contrast Result Date: 11/28/2023 EXAM: CTA CHEST PE WITH AND WITHOUT CONTRAST CT ABDOMEN AND PELVIS WITH AND WITHOUT CONTRAST 11/28/2023 09:45:00 PM TECHNIQUE: CTA of the chest was performed after the administration of 75 mL of iohexol  (OMNIPAQUE ) 350 MG/ML injection. Multiplanar reformatted images are provided for review. MIP images are provided for review. CT  of the abdomen and pelvis was performed with and without the administration of intravenous contrast. Automated exposure control,  iterative reconstruction, and/or weight based adjustment of the mA/kV was utilized to reduce the radiation dose to as low as reasonably achievable. COMPARISON: None available. CLINICAL HISTORY: Known history of PE. Tachycardia with chest pain. FINDINGS: CHEST: PULMONARY ARTERIES: Pulmonary arteries are adequately opacified for evaluation. No intraluminal filling defect to suggest pulmonary embolism. Main pulmonary artery is normal in caliber. MEDIASTINUM: Mild coronary artery calcification. Global cardiac size within normal limits. No pericardial effusion. No mediastinal lymphadenopathy. Mild atherosclerotic calcification within the thoracic aorta. No aortic aneurysm. LUNGS AND PLEURA: Moderate emphysema. No focal consolidation or pulmonary edema. No pleural effusion or pneumothorax. SOFT TISSUES AND BONES: Osseous structures are age appropriate. No acute bone abnormality. No lytic or blastic bone lesion. No acute soft tissue abnormality. 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 No enhancing intrahepatic mass. GALLBLADDER AND BILE DUCTS: Gallbladder is unremarkable. No biliary ductal dilatation. SPLEEN: Spleen demonstrates no acute abnormality. PANCREAS: Pancreas demonstrates no acute abnormality. ADRENAL GLANDS: Heterogeneously enhancing mixed attenuation mass within the left adrenal gland is again identified in keeping with a slow growing adrenal adenoma that are characterized on multiple prior examinations demonstrating interval growth since remote prior examination is now measuring 5.9 cm in greatest dimension (previously measuring 4.3 cm in 2014). Right adrenal gland is unremarkable. KIDNEYS, URETERS AND BLADDER: No stones in the kidneys or ureters. No hydronephrosis. No perinephric or periureteral stranding. Urinary bladder is unremarkable. GI AND BOWEL: There is an internal hernia present with severe extrinsic  compression of several large jejunal veins and marked mass effect on the superior mesenteric vein at the point of entry within the hernia which is essentially at the mesenteric root. 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 The herniated bowel involves almost the entire length of small bowel which is markedly dilated and fluid filled. There is, additionally, evidence of mesenteric ischemia involving at least 1 loop of bowel within the left upper quadrant best seen on coronal image 62 and axial image 36. 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 The stomach is fluid-filled and distended with fluid within the distal esophagus in keeping with changes of gastroesophageal reflux. Mild sigmoid diverticulosis. The large bowel is otherwise unremarkable. Appendix normal. REPRODUCTIVE: Reproductive organs are unremarkable. PERITONEUM AND RETROPERITONEUM: Mild ascites. No gross free intraperitoneal gas. LYMPH NODES: No lymphadenopathy. BONES AND SOFT TISSUES: Osseous structures are age appropriate. No acute bone abnormality. No lytic or blastic bone lesion. No focal soft tissue abnormality. Mild aortoiliac atherosclerotic calcification. No aortic aneurysm. IMPRESSION: 1. No pulmonary embolism. 2. 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 While a single loop of bowel appears infarcted, the vast majority of small bowel appears viable at this point. As noted above, the herniated bowel involves nearly the entire length of small bowel. Emergent surgical evaluation recommended. 3. Moderate pulmonary emphysema. Given emphysema as an independent lung cancer risk factor,  consider evaluation for low-dose CT lung cancer screening if the patient is 59 years old and meets eligibility criteria. 4. Enlarging left adrenal mass now 5.9 cm, previously 4.3 cm  in 2014. Recommend surgical consultation and biochemical evaluation for a functioning adrenal lesion (e.g., pheochromocytoma) prior to considering resection. - the critical findings of this study were discussed directly with Dr. Simon by myself at 10:35 pm. Electronically signed by: Dorethia Molt MD 11/28/2023 10:38 PM EST RP Workstation: [id number]    Microbiology: Results for orders placed or performed during the hospital encounter of 12/09/23  Gastrointestinal Panel by PCR , Stool     Status: None   Collection Time: 12/10/23 12:57 AM   Specimen: Stool  Result Value Ref Range Status   Campylobacter species NOT DETECTED NOT DETECTED Final   Plesimonas shigelloides NOT DETECTED NOT DETECTED Final   Salmonella species NOT DETECTED NOT DETECTED Final   Yersinia enterocolitica NOT DETECTED NOT DETECTED Final   Vibrio species NOT DETECTED NOT DETECTED Final   Vibrio cholerae NOT DETECTED NOT DETECTED Final   Enteroaggregative E coli (EAEC) NOT DETECTED NOT DETECTED Final   Enteropathogenic E coli (EPEC) NOT DETECTED NOT DETECTED Final   Enterotoxigenic E coli (ETEC) NOT DETECTED NOT DETECTED Final   Shiga like toxin producing E coli (STEC) NOT DETECTED NOT DETECTED Final   Shigella/Enteroinvasive E coli (EIEC) NOT DETECTED NOT DETECTED Final   Cryptosporidium NOT DETECTED NOT DETECTED Final   Cyclospora cayetanensis NOT DETECTED NOT DETECTED Final   Entamoeba histolytica NOT DETECTED NOT DETECTED Final   Giardia lamblia NOT DETECTED NOT DETECTED Final   Adenovirus F40/41 NOT DETECTED NOT DETECTED Final   Astrovirus NOT DETECTED NOT DETECTED Final   Norovirus GI/GII NOT DETECTED NOT DETECTED Final   Rotavirus A NOT DETECTED NOT DETECTED Final   Sapovirus (I, II, IV, and V) NOT DETECTED NOT DETECTED Final     Comment: Performed at [facility], [address] [place], [place] [address]  C Difficile Quick Screen w PCR reflex     Status: None   Collection Time: 12/10/23 12:57 AM   Specimen: Stool  Result Value Ref Range Status   C Diff antigen NEGATIVE NEGATIVE Final   C Diff toxin NEGATIVE NEGATIVE Final   C Diff interpretation No C. difficile detected.  Final    Comment: Performed at [facility], [address] [place], [place] [address]  Culture, blood (single) w Reflex to ID Panel     Status: None   Collection Time: 12/10/23  9:05 AM   Specimen: BLOOD RIGHT HAND  Result Value Ref Range Status   Specimen Description BLOOD RIGHT HAND  Final   Special Requests   Final    BOTTLES DRAWN AEROBIC ONLY Blood Culture adequate volume   Culture   Final    NO GROWTH 5 DAYS Performed at [facility], [address], [place], [place] [address]    Report Status 12/15/2023 FINAL  Final    Labs: CBC: Recent Labs  Lab 12/13/23 0255 12/14/23 0316 12/15/23 0320 12/16/23 0317 12/17/23 0358  WBC 12.0* 9.1 10.9* 11.8* 9.6  HGB 11.8* 11.3* 11.4* 11.6* 11.5*  HCT 34.8* 33.8* 34.4* 35.1* 35.4*  MCV 92.1 92.6 95.3 94.4 95.7  PLT 368 296 263 259 225   Basic Metabolic Panel: Recent Labs  Lab 12/11/23 0934 12/11/23 1534 12/12/23 0337 12/13/23 0933 12/13/23 1929 12/14/23 0316 12/14/23 1358 12/15/23 0948  NA 140  --  142 142  --  140  --  139  K 2.7*   < > 3.8 2.4* 2.8* 2.7* 4.1 4.6  CL 106  --  108 104  --  104  --  107  CO2 20*  --  12* 29  --  27  --  24  GLUCOSE 84  --  84 142*  --  122*  --  169*  BUN <5*  --  <5* <5*  --  <5*  --  <5*  CREATININE 0.65  --  0.71 0.61  --  0.72  --  0.78  CALCIUM  7.2*  --  7.8* 7.4*  --  7.2*  --  7.7*  MG 1.6*  --  1.9 1.4*  --  1.3* 2.2  --    < > = values in this interval not displayed.   Liver Function Tests: Recent Labs  Lab 12/14/23 1358  ALBUMIN  1.9*   CBG: Recent Labs  Lab 12/16/23 0607 12/16/23 1219  12/16/23 1805 12/17/23 0004 12/17/23 0613  GLUCAP 97 99 192* 164* 204*    Discharge time spent: 35 minutes.  Signed: Elgin Lam, MD [facility] 12/17/2023

## 2023-12-17 NOTE — Progress Notes (Signed)
 Subjective: Denies abdominal pain. Tolerating PO but not eating much because she does not like the options on soft diet/ +Flatus. BM yesterday. At baseline she reports being incontinent of urine.   Objective: Vital signs in last 24 hours: Temp:  [98.2 F (36.8 C)-99.3 F (37.4 C)] 98.2 F (36.8 C) (12/09 0348) Pulse Rate:  [83-100] 88 (12/09 0348) Resp:  [17-20] 17 (12/09 0348) BP: (97-106)/(57-72) 106/72 (12/09 0348) SpO2:  [90 %-96 %] 96 % (12/09 0348) Weight:  [64.9 kg] 64.9 kg (12/09 0348) Last BM Date : 12/16/23  Intake/Output from previous day: 12/08 0701 - 12/09 0700 In: 394 [P.O.:120; I.V.:174; IV Piggyback:100] Out: 1650 [Urine:1650] Intake/Output this shift: No intake/output data recorded.  PE: Gen: chronically ill appearing female Heart: RRR Lungs: normal effort, no wheezing at present Abd: soft, nontender, ND, midline incision c/d/I - some fibrinous exudate proximal wound but fascia in tact.,    Lab Results:  Recent Labs    12/16/23 0317 12/17/23 0358  WBC 11.8* 9.6  HGB 11.6* 11.5*  HCT 35.1* 35.4*  PLT 259 225   BMET Recent Labs    12/14/23 1358 12/15/23 0948  NA  --  139  K 4.1 4.6  CL  --  107  CO2  --  24  GLUCOSE  --  169*  BUN  --  <5*  CREATININE  --  0.78  CALCIUM   --  7.7*   PT/INR No results for input(s): LABPROT, INR in the last 72 hours. CMP     Component Value Date/Time   NA 139 12/15/2023 0948   NA 134 05/01/2021 1532   K 4.6 12/15/2023 0948   CL 107 12/15/2023 0948   CO2 24 12/15/2023 0948   GLUCOSE 169 (H) 12/15/2023 0948   BUN <5 (L) 12/15/2023 0948   BUN 8 05/01/2021 1532   CREATININE 0.78 12/15/2023 0948   CALCIUM  7.7 (L) 12/15/2023 0948   PROT 4.5 (L) 12/10/2023 0913   PROT 7.4 05/01/2021 1532   ALBUMIN  1.9 (L) 12/14/2023 1358   ALBUMIN  4.4 05/01/2021 1532   AST 11 (L) 12/10/2023 0913   ALT 11 12/10/2023 0913   ALKPHOS 57 12/10/2023 0913   BILITOT 0.4 12/10/2023 0913   BILITOT 0.3 05/01/2021  1532   GFRNONAA >60 12/15/2023 0948   GFRAA >60 02/28/2019 2041   Lipase     Component Value Date/Time   LIPASE 21 12/09/2023 2243       Studies/Results: CT CHEST ABDOMEN PELVIS W CONTRAST Result Date: 12/15/2023 EXAM: CT CHEST, ABDOMEN AND PELVIS WITH CONTRAST 12/15/2023 10:18:30 PM TECHNIQUE: CT of the chest, abdomen and pelvis was performed with the administration of 75 mL of iohexol  (OMNIPAQUE ) 350 MG/ML injection. Multiplanar reformatted images are provided for review. Automated exposure control, iterative reconstruction, and/or weight based adjustment of the mA/kV was utilized to reduce the radiation dose to as low as reasonably achievable. COMPARISON: 12/10/2023 CLINICAL HISTORY: Acute respiratory failure. s/p laparotomy with bowel resection, pancolitis. FINDINGS: CHEST: MEDIASTINUM AND LYMPH NODES: Heart and pericardium are unremarkable. Calcifications in the left main and left anterior descending coronary arteries. Scattered aortic atherosclerosis. The central airways are clear. No mediastinal, hilar or axillary lymphadenopathy. LUNGS AND PLEURA: Moderate emphysema. Small bilateral pleural effusions, new since prior study. Compressive atelectasis in the lower lobes. New interstitial thickening and ground-glass opacities posteriorly in the right upper lobe could reflect pneumonia. No pneumothorax. ABDOMEN AND PELVIS: LIVER: The liver is unremarkable. GALLBLADDER AND BILE DUCTS: Gallstone within the  gallbladder fundus. No biliary ductal dilatation. SPLEEN: No acute abnormality. PANCREAS: No acute abnormality. ADRENAL GLANDS: Right adrenal gland normal. Complex mixed solid and cystic mass in the left adrenal gland with scattered calcifications measures 5.7 x 4.9 cm, stable. KIDNEYS, URETERS AND BLADDER: No stones in the kidneys or ureters. No hydronephrosis. No perinephric or periureteral stranding. Urinary bladder is unremarkable. GI AND BOWEL: Stomach demonstrates no acute abnormality. There  is no bowel obstruction. REPRODUCTIVE ORGANS: No acute abnormality. PERITONEUM AND RETROPERITONEUM: Moderate free fluid in the abdomen and pelvis. No free air. VASCULATURE: Aorta is normal in caliber. Diffuse aortoiliac atherosclerosis. ABDOMINAL AND PELVIS LYMPH NODES: No lymphadenopathy. BONES AND SOFT TISSUES: Open midline surgical incision. Diffuse edema throughout the subcutaneous soft tissues compatible with anasarca. No acute osseous abnormality. IMPRESSION: 1. New interstitial thickening and ground-glass opacities in the posterior right upper lobe, concerning for pneumonia. 2. Small bilateral pleural effusions, new since prior study. 3. Coronary artery disease, aortic atherosclerosis. 4. Moderate emphysema. 5. Moderate ascites, increasing since prior study. . 6. Complex mixed solid and cystic left adrenal mass with calcifications, stable. As recommended previously, surgical consultation recommended. 7. Anasarca. Electronically signed by: Franky Crease MD 12/15/2023 10:31 PM EST RP Workstation: [id number]   DG CHEST PORT 1 VIEW Result Date: 12/15/2023 EXAM: 1 VIEW(S) XRAY OF THE CHEST 12/15/2023 10:09:40 AM COMPARISON: 12/12/2023 CLINICAL HISTORY: 200808 Hypoxia 200808 FINDINGS: LINES, TUBES AND DEVICES: Enteric tube removed. Left chest cardiac loop recorder or leadless pacemaker noted. LUNGS AND PLEURA: Diffuse interstitial opacities. Increased right perihilar and basilar heterogeneous opacities. Increased small right pleural effusion. Unchanged trace left pleural effusion. No pneumothorax. 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 No acute abnormality of the cardiac and mediastinal silhouettes. BONES AND SOFT TISSUES: No acute osseous abnormality. IMPRESSION: 1. Increased right perihilar and basilar heterogeneous opacities. 2. Increased small right pleural effusion. 3. Unchanged trace left pleural effusion. Electronically signed by: Evalene Coho MD 12/15/2023  10:33 AM EST RP Workstation: [id number]     Anti-infectives: Anti-infectives (From admission, onward)    Start     Dose/Rate Route Frequency Ordered Stop   12/16/23 1000  azithromycin  (ZITHROMAX ) tablet 500 mg        500 mg Oral Daily 12/16/23 0536 12/21/23 0959   12/10/23 2200  cefTRIAXone  (ROCEPHIN ) 2 g in sodium chloride  0.9 % 100 mL IVPB        2 g 200 mL/hr over 30 Minutes Intravenous Every 24 hours 12/10/23 0303     12/10/23 1000  metroNIDAZOLE  (FLAGYL ) IVPB 500 mg        500 mg 100 mL/hr over 60 Minutes Intravenous Every 12 hours 12/10/23 0303     12/10/23 0100  cefTRIAXone  (ROCEPHIN ) 2 g in sodium chloride  0.9 % 100 mL IVPB        2 g 200 mL/hr over 30 Minutes Intravenous  Once 12/10/23 0058 12/10/23 0154   12/10/23 0100  metroNIDAZOLE  (FLAGYL ) IVPB 500 mg        500 mg 100 mL/hr over 60 Minutes Intravenous  Once 12/10/23 0058 12/10/23 0302        Assessment/Plan S/p ex lap with SBR for internal hernia with ischemic bowel by Dr. Sebastian on 11/21 now with ileus and pancolitis - C.diff and GI panel negative, agree with completing course of abx for pancolitis  - GI consulted 12/5 for ileus, started lomotil , signed off 12/6; mgmt of lomotil  per GI - diet advanced to regular. - general surgery will sign off.  Surgical follow up currently scheduled for 12/17, will try to push this back 1-2 weeks. Continue twice daily moist-to-dry dressing changes. Ok to transition to DOAC from CCS standpoint.   HCAP - RUL PNA on CT 12/7 per [facility]  FEN - soft diet VTE - heparin  gtt - ok to transition to DOAC from abdominal standpoint but putting back on hep gtt with respiratory change  ID - Rocephin /Flagyl   --per [facility]-- B PEs - on heparin  per medicine DM H/o CVA HTN   LOS: 7 days    [name] , [facility] 12/17/2023, 9:19 AM Please see Amion for pager number during day hours 7:00am-4:30pm or 7:00am -11:30am on weekends

## 2023-12-17 NOTE — Progress Notes (Signed)
 Nurse requested Mobility Specialist to perform oxygen saturation test with pt which includes removing pt from oxygen both at rest and while ambulating.  Below are the results from that testing.     Patient Saturations on Room Air at Rest = spO2 95%  Patient Saturations on Room Air while Ambulating = sp02 94% .    Patient Saturations on 0 Liters of oxygen while Ambulating = sp02 94%  At end of testing pt left in room on 0  Liters of oxygen.  Reported results to nurse.    Lauraine Erm Mobility Specialist Please contact via SecureChat or [employer] [phone]

## 2023-12-20 ENCOUNTER — Emergency Department (HOSPITAL_COMMUNITY)
Admission: EM | Admit: 2023-12-20 | Discharge: 2023-12-20 | Disposition: A | Discharge status: Home / Self Care | Attending: Emergency Medicine | Admitting: Emergency Medicine

## 2023-12-20 ENCOUNTER — Encounter (HOSPITAL_COMMUNITY): Payer: Self-pay

## 2023-12-20 ENCOUNTER — Other Ambulatory Visit: Payer: Self-pay

## 2023-12-20 ENCOUNTER — Emergency Department (HOSPITAL_COMMUNITY)

## 2023-12-20 DIAGNOSIS — I10 Essential (primary) hypertension: Secondary | ICD-10-CM | POA: Diagnosis not present

## 2023-12-20 DIAGNOSIS — D649 Anemia, unspecified: Secondary | ICD-10-CM | POA: Insufficient documentation

## 2023-12-20 DIAGNOSIS — I2699 Other pulmonary embolism without acute cor pulmonale: Secondary | ICD-10-CM | POA: Insufficient documentation

## 2023-12-20 DIAGNOSIS — Y730 Diagnostic and monitoring gastroenterology and urology devices associated with adverse incidents: Secondary | ICD-10-CM | POA: Insufficient documentation

## 2023-12-20 DIAGNOSIS — Z7984 Long term (current) use of oral hypoglycemic drugs: Secondary | ICD-10-CM | POA: Insufficient documentation

## 2023-12-20 DIAGNOSIS — Z7901 Long term (current) use of anticoagulants: Secondary | ICD-10-CM | POA: Diagnosis not present

## 2023-12-20 DIAGNOSIS — K56609 Unspecified intestinal obstruction, unspecified as to partial versus complete obstruction: Secondary | ICD-10-CM | POA: Diagnosis not present

## 2023-12-20 DIAGNOSIS — E119 Type 2 diabetes mellitus without complications: Secondary | ICD-10-CM | POA: Insufficient documentation

## 2023-12-20 DIAGNOSIS — K8020 Calculus of gallbladder without cholecystitis without obstruction: Secondary | ICD-10-CM | POA: Diagnosis not present

## 2023-12-20 DIAGNOSIS — T81321D Disruption or dehiscence of closure of internal operation (surgical) wound of abdominal wall muscle or fascia, subsequent encounter: Secondary | ICD-10-CM

## 2023-12-20 DIAGNOSIS — T8131XA Disruption of external operation (surgical) wound, not elsewhere classified, initial encounter: Secondary | ICD-10-CM | POA: Diagnosis not present

## 2023-12-20 DIAGNOSIS — T8131XD Disruption of external operation (surgical) wound, not elsewhere classified, subsequent encounter: Secondary | ICD-10-CM | POA: Insufficient documentation

## 2023-12-20 LAB — CBC WITH DIFFERENTIAL/PLATELET
Abs Immature Granulocytes: 0.03 K/uL (ref 0.00–0.07)
Basophils Absolute: 0.1 K/uL (ref 0.0–0.1)
Basophils Relative: 1 %
Eosinophils Absolute: 0.2 K/uL (ref 0.0–0.5)
Eosinophils Relative: 2 %
HCT: 36.3 % (ref 36.0–46.0)
Hemoglobin: 11.9 g/dL — ABNORMAL LOW (ref 12.0–15.0)
Immature Granulocytes: 0 %
Lymphocytes Relative: 39 %
Lymphs Abs: 3.3 K/uL (ref 0.7–4.0)
MCH: 31.6 pg (ref 26.0–34.0)
MCHC: 32.8 g/dL (ref 30.0–36.0)
MCV: 96.3 fL (ref 80.0–100.0)
Monocytes Absolute: 0.7 K/uL (ref 0.1–1.0)
Monocytes Relative: 9 %
Neutro Abs: 4.1 K/uL (ref 1.7–7.7)
Neutrophils Relative %: 49 %
Platelets: 222 K/uL (ref 150–400)
RBC: 3.77 MIL/uL — ABNORMAL LOW (ref 3.87–5.11)
RDW: 14.6 % (ref 11.5–15.5)
WBC: 8.4 K/uL (ref 4.0–10.5)
nRBC: 0 % (ref 0.0–0.2)

## 2023-12-20 LAB — COMPREHENSIVE METABOLIC PANEL WITH GFR
ALT: 7 U/L (ref 0–44)
AST: 15 U/L (ref 15–41)
Albumin: 3 g/dL — ABNORMAL LOW (ref 3.5–5.0)
Alkaline Phosphatase: 64 U/L (ref 38–126)
Anion gap: 9 (ref 5–15)
BUN: 5 mg/dL — ABNORMAL LOW (ref 6–20)
CO2: 26 mmol/L (ref 22–32)
Calcium: 8.2 mg/dL — ABNORMAL LOW (ref 8.9–10.3)
Chloride: 109 mmol/L (ref 98–111)
Creatinine, Ser: 0.75 mg/dL (ref 0.44–1.00)
GFR, Estimated: >60 mL/min (ref >60)
Glucose, Bld: 128 mg/dL — ABNORMAL HIGH (ref 70–99)
Potassium: 3.6 mmol/L (ref 3.5–5.1)
Sodium: 144 mmol/L (ref 135–145)
Total Bilirubin: <0.2 mg/dL (ref 0.0–1.2)
Total Protein: 5.5 g/dL — ABNORMAL LOW (ref 6.5–8.1)

## 2023-12-20 LAB — I-STAT CG4 LACTIC ACID, ED: Lactic Acid, Venous: 1.2 mmol/L (ref 0.5–1.9)

## 2023-12-20 MED ORDER — IOHEXOL 300 MG/ML  SOLN
100.0000 mL | Freq: Once | INTRAMUSCULAR | Status: AC | PRN
  Administered 2023-12-20: 100 mL via INTRAVENOUS

## 2023-12-20 NOTE — ED Provider Notes (Signed)
 [place] EMERGENCY DEPARTMENT AT [facility] Provider Note   CSN: [id number] Arrival date & time: 12/20/23  9688     Patient presents with: Wound Check   [name] is a 58 y.o. female.   The history is provided by the patient and the EMS personnel.  Wound Check   She has history of hypertension, diabetes, pulmonary embolism anticoagulated on apixaban , stroke and comes in because her abdomen feels funny and she is afraid that her surgical wound is infected.  She had been at admitted to the hospital recently and had laparotomy for bowel obstruction, had staples removed 3 days ago and was placed on a Z-Pak at that point.  She has had subjective fever as well as chills and sweats.  She denies any nausea or vomiting.  She denies any drainage from the wound.  EMS reported SIRS criteria.    Prior to Admission medications  Medication Sig Start Date End Date Taking? Authorizing Provider  acetaminophen  (TYLENOL ) 500 MG tablet Take 2 tablets (1,000 mg total) by mouth every 6 (six) hours as needed for mild pain (pain score 1-3). 12/03/23   Al-Sultani, Anmar, MD  apixaban  (ELIQUIS ) 5 MG TABS tablet Take 2 tablets (10 mg total) by mouth 2 (two) times daily for 7 days, THEN 1 tablet (5 mg total) 2 (two) times daily. 12/17/23 03/16/24  Briana Elgin LABOR, MD  azithromycin  (ZITHROMAX ) 500 MG tablet Take 1 tablet (500 mg total) by mouth daily for 3 days. 12/18/23 12/21/23  Briana Elgin LABOR, MD  metFORMIN  (GLUCOPHAGE ) 500 MG tablet Take 1 tablet (500 mg total) by mouth 2 (two) times daily with a meal. 12/03/23   Al-Sultani, Anmar, MD  methocarbamol  (ROBAXIN ) 750 MG tablet Take 750 mg by mouth 4 (four) times daily.    [provider]  Multiple Vitamin (MULTIVITAMIN WITH MINERALS) TABS tablet Take 1 tablet by mouth daily. 12/03/23   Al-Sultani, Anmar, MD  ondansetron  (ZOFRAN -ODT) 4 MG disintegrating tablet Take 1 tablet (4 mg total) by mouth every 8 (eight) hours as needed for nausea  or vomiting. 12/06/23   Tegeler, Lonni PARAS, MD  oxycodone  (OXY-IR) 5 MG capsule Take 5 mg by mouth every 6 (six) hours as needed for pain.    [provider]  pantoprazole  (PROTONIX ) 40 MG tablet Take 1 tablet (40 mg total) by mouth at bedtime. 12/03/23 01/02/24  Al-Sultani, Anmar, MD  polyethylene glycol (MIRALAX  / GLYCOLAX ) 17 g packet Take 17 g by mouth daily as needed for mild constipation. 12/03/23   Al-Sultani, Anmar, MD    Allergies: Amoxicillin, Aspirin, Codeine, and Penicillins    Review of Systems  All other systems reviewed and are negative.   Updated Vital Signs BP 113/69   Pulse 95   Temp 98.9 F (37.2 C)   Resp 18   LMP  (LMP Unknown) Comment: a few months  SpO2 98%   Physical Exam Vitals and nursing note reviewed.   58 year old female, resting comfortably and in no acute distress. Vital signs are normal. Oxygen saturation is 98%, which is normal. Head is normocephalic and atraumatic. PERRLA, EOMI. Oropharynx is clear. Neck is nontender and supple. Lungs are clear without rales, wheezes, or rhonchi. Chest is nontender. Heart has regular rate and rhythm without murmur. Abdomen is soft, flat.  Midline surgical scar is present with wound dehiscence and packing in place.  Wound appears clean without evidence of active infection.  There is no abdominal tenderness to palpation. Extremities have no  cyanosis or edema, full range of motion is present. Skin is warm and dry without rash. Neurologic: Mental status is normal, cranial nerves are intact, moves all extremities equally.    (all labs ordered are listed, but only abnormal results are displayed) Labs Reviewed  CBC WITH DIFFERENTIAL/PLATELET - Abnormal; Notable for the following components:      Result Value   RBC 3.77 (*)    Hemoglobin 11.9 (*)    All other components within normal limits  COMPREHENSIVE METABOLIC PANEL WITH GFR - Abnormal; Notable for the following components:   Glucose, Bld 128  (*)    BUN 5 (*)    Calcium  8.2 (*)    Total Protein 5.5 (*)    Albumin  3.0 (*)    All other components within normal limits  I-STAT CG4 LACTIC ACID, ED  I-STAT CG4 LACTIC ACID, ED    Radiology: CT ABDOMEN PELVIS W CONTRAST Result Date: 12/20/2023 EXAM: CT ABDOMEN AND PELVIS WITH CONTRAST 12/20/2023 06:22:27 AM TECHNIQUE: CT of the abdomen and pelvis was performed with the administration of 100 mL of iohexol  (OMNIPAQUE ) 300 MG/ML solution. Multiplanar reformatted images are provided for review. Automated exposure control, iterative reconstruction, and/or weight-based adjustment of the mA/kV was utilized to reduce the radiation dose to as low as reasonably achievable. COMPARISON: CT chest, abdomen, and pelvis 12/15/2023 and earlier. CLINICAL HISTORY: 59 year old female with abdominal pain, acute, nonlocalized. History of emphysema, large chronic left adrenal mass, abdominal surgery 3 weeks ago for small bowel obstruction and bowel ischemia. FINDINGS: LOWER CHEST: Regressed but not fully resolved pleural effusions, small volume partially loculated left pleural effusion at the lung base (series 2 image 12). Normal heart size. No pericardial effusion. Chronic emphysema and pulmonary interstitial thickening, improved lung base ventilation. Residual platelike atelectasis greater on the left. LIVER: The liver is unremarkable. GALLBLADDER AND BILE DUCTS: Extensive gravel type layering gallstones within the gallbladder which is nondilated and no surrounding inflammation (series 2 image 32). No biliary ductal dilatation. SPLEEN: No acute abnormality. PANCREAS: No acute abnormality. ADRENAL GLANDS: Heterogeneous chronic left adrenal mass measures nearly 6 cm. Right adrenal gland remains normal. KIDNEYS, URETERS AND BLADDER: Nonobstructed kidneys with small areas of chronic renal cortical scarring or atrophy bilaterally. Symmetric renal contrast excretion. No stones in the kidneys or ureters. No hydronephrosis. No  perinephric or periureteral stranding. Urinary bladder is unremarkable. GI AND BOWEL: Gas and fluid in the stomach, decompressed duodenum. Indistinct appearance of the large and small bowel diffusely, but no dilated loops. Fluid containing loops including fluid in the large bowel to the level of the rectum. Consider acute enteritis, diarrhea. Distal small bowel anastomosis in the right lower quadrant with no adverse features on series 2 image 53. Normal nondilated appendix on image 48. PERITONEUM AND RETROPERITONEUM: Regressed and near complete resolution of ascites. Presacral edema persists. No free air. VASCULATURE: Advanced aortoiliac atherosclerosis. Major arterial structures in the abdomen and pelvis remain patent. Portal venous system is patent. LYMPH NODES: No lymphadenopathy. REPRODUCTIVE ORGANS: No acute abnormality. BONES AND SOFT TISSUES: Regressed but not resolved generalized body wall edema. Extensive chronic bilateral flank soft tissue dystrophic calcifications, possibly injection granulomas. Midline ventral abdominal wound healing by secondary intention with no adverse features. No acute osseous abnormality. IMPRESSION: 1. Improved but not completely normalized CT appearance of the lower chest, abdomen, and pelvis since last week. Regressed but not completely resolved pleural effusions; a small residual on the left is loculated. Near complete resolution of ascites. Regressed but not fully resolved body  wall edema, presacral edema. 2. Postsurgical distal small bowel anastomosis in the right lower quadrant without adverse features. No evidence of bowel obstruction. But indistinct appearance of fluid containing small and large bowel loops, Consider enteritis or diarrhea. 3. No other acute or inflammatory process identified in the abdomen or pelvis. Chronic Cholelithiasis. 4. Chronic left adrenal mass measuring nearly 6 cm again noted. 5. Advanced aortoiliac atherosclerosis again noted with patent major  abdominal and pelvic arteries. Electronically signed by: Helayne Hurst MD 12/20/2023 06:39 AM EST RP Workstation: [id number]     Procedures   Medications Ordered in the ED  iohexol  (OMNIPAQUE ) 300 MG/ML solution 100 mL (100 mLs Intravenous Contrast Given 12/20/23 0607)                                    Medical Decision Making Amount and/or Complexity of Data Reviewed Labs: ordered. Radiology: ordered.  Risk Prescription drug management.   Abdominal wound dehiscence without evidence of infection on exam.  Report of SIRS criteria per EMS.  I have reviewed the run sheet and do note she was reported to be tachycardic, but she is not tachycardic here nor is she febrile.  I have ordered screening labs as well as CT of abdomen and pelvis to look for occult infectious process.  I have reviewed her past records, and note hospitalization 11/28/2023-12/03/2023 for partial small bowel obstruction with exploratory laparotomy and small bowel resection.  She was then admitted 12/09/2023-12/17/2023 for ileus.  At this point, I do not see evidence of sepsis.  I have reviewed her laboratory tests, and my interpretation is stable anemia, normal WBC and differential, normal lactic acid level.  CT of abdomen and pelvis shows no evidence of active wound infection or cellulitis and generalized improvement of other abnormalities.  Note is made of indistinct appearance of fluid containing loops of small and large bowel-consider enteritis or diarrhea.  She clinically has neither of those.  I have independently viewed the images, and agree with the radiologist's interpretation.  I have explained to the patient that her wound dehiscence will require months of local wound care.  Apparently, she is being referred to the wound management clinic, which is appropriate.  I have advised her to return if she develops fever, pain, purulent drainage.  Otherwise follow-up with wound care clinic.     Final diagnoses:  Abdominal  wound dehiscence, subsequent encounter  Normochromic normocytic anemia  Anticoagulated on apixaban     ED Discharge Orders     None          Raford Lenis, MD 12/20/23 0800

## 2023-12-20 NOTE — ED Notes (Signed)
 Nurse and CNA changed the PT who had a loose bowel movement. PT bottom was red and had a bit of a rash on it. Dr notified.

## 2023-12-20 NOTE — ED Triage Notes (Signed)
 PT BIB [facility], PER EMS PT met there SIRS criteria. PT had ABD surgery about 3 weeks ago for impaction. Staples were removed staples 3 days ago and was put on a z-pak. PT afraid her surgical site is infected.

## 2023-12-20 NOTE — ED Notes (Signed)
 Inc care. Pt able to turn, lift up, pull down and up pants.

## 2023-12-30 ENCOUNTER — Emergency Department (HOSPITAL_COMMUNITY)
Admission: EM | Admit: 2023-12-30 | Discharge: 2023-12-31 | Disposition: A | Discharge status: Home / Self Care | Attending: Emergency Medicine | Admitting: Emergency Medicine

## 2023-12-30 DIAGNOSIS — Z7984 Long term (current) use of oral hypoglycemic drugs: Secondary | ICD-10-CM | POA: Insufficient documentation

## 2023-12-30 DIAGNOSIS — X58XXXA Exposure to other specified factors, initial encounter: Secondary | ICD-10-CM | POA: Diagnosis not present

## 2023-12-30 DIAGNOSIS — R1032 Left lower quadrant pain: Secondary | ICD-10-CM | POA: Diagnosis not present

## 2023-12-30 DIAGNOSIS — T8131XA Disruption of external operation (surgical) wound, not elsewhere classified, initial encounter: Secondary | ICD-10-CM | POA: Diagnosis not present

## 2023-12-30 DIAGNOSIS — R109 Unspecified abdominal pain: Secondary | ICD-10-CM | POA: Diagnosis present

## 2023-12-30 DIAGNOSIS — K8020 Calculus of gallbladder without cholecystitis without obstruction: Secondary | ICD-10-CM | POA: Diagnosis not present

## 2023-12-30 DIAGNOSIS — R7401 Elevation of levels of liver transaminase levels: Secondary | ICD-10-CM | POA: Diagnosis not present

## 2023-12-30 DIAGNOSIS — I129 Hypertensive chronic kidney disease with stage 1 through stage 4 chronic kidney disease, or unspecified chronic kidney disease: Secondary | ICD-10-CM | POA: Insufficient documentation

## 2023-12-30 DIAGNOSIS — S300XXA Contusion of lower back and pelvis, initial encounter: Secondary | ICD-10-CM | POA: Diagnosis not present

## 2023-12-30 DIAGNOSIS — Y738 Miscellaneous gastroenterology and urology devices associated with adverse incidents, not elsewhere classified: Secondary | ICD-10-CM | POA: Diagnosis not present

## 2023-12-30 DIAGNOSIS — N183 Chronic kidney disease, stage 3 unspecified: Secondary | ICD-10-CM | POA: Diagnosis not present

## 2023-12-30 DIAGNOSIS — E1122 Type 2 diabetes mellitus with diabetic chronic kidney disease: Secondary | ICD-10-CM | POA: Diagnosis not present

## 2023-12-31 ENCOUNTER — Encounter (HOSPITAL_COMMUNITY): Payer: Self-pay

## 2023-12-31 ENCOUNTER — Other Ambulatory Visit: Payer: Self-pay

## 2023-12-31 ENCOUNTER — Emergency Department (HOSPITAL_COMMUNITY)

## 2023-12-31 DIAGNOSIS — K8050 Calculus of bile duct without cholangitis or cholecystitis without obstruction: Secondary | ICD-10-CM | POA: Diagnosis not present

## 2023-12-31 DIAGNOSIS — R7401 Elevation of levels of liver transaminase levels: Secondary | ICD-10-CM | POA: Diagnosis not present

## 2023-12-31 DIAGNOSIS — N281 Cyst of kidney, acquired: Secondary | ICD-10-CM | POA: Diagnosis not present

## 2023-12-31 DIAGNOSIS — K8020 Calculus of gallbladder without cholecystitis without obstruction: Secondary | ICD-10-CM | POA: Diagnosis not present

## 2023-12-31 DIAGNOSIS — Z9889 Other specified postprocedural states: Secondary | ICD-10-CM | POA: Diagnosis not present

## 2023-12-31 LAB — CBC WITH DIFFERENTIAL/PLATELET
Abs Immature Granulocytes: 0.03 K/uL (ref 0.00–0.07)
Basophils Absolute: 0.1 K/uL (ref 0.0–0.1)
Basophils Relative: 1 %
Eosinophils Absolute: 0.2 K/uL (ref 0.0–0.5)
Eosinophils Relative: 2 %
HCT: 43 % (ref 36.0–46.0)
Hemoglobin: 13.5 g/dL (ref 12.0–15.0)
Immature Granulocytes: 0 %
Lymphocytes Relative: 24 %
Lymphs Abs: 2.3 K/uL (ref 0.7–4.0)
MCH: 30.9 pg (ref 26.0–34.0)
MCHC: 31.4 g/dL (ref 30.0–36.0)
MCV: 98.4 fL (ref 80.0–100.0)
Monocytes Absolute: 0.7 K/uL (ref 0.1–1.0)
Monocytes Relative: 8 %
Neutro Abs: 6.1 K/uL (ref 1.7–7.7)
Neutrophils Relative %: 65 %
Platelets: 313 K/uL (ref 150–400)
RBC: 4.37 MIL/uL (ref 3.87–5.11)
RDW: 15.5 % (ref 11.5–15.5)
WBC: 9.4 K/uL (ref 4.0–10.5)
nRBC: 0 % (ref 0.0–0.2)

## 2023-12-31 LAB — URINALYSIS, ROUTINE W REFLEX MICROSCOPIC
Bacteria, UA: NONE SEEN
Bilirubin Urine: NEGATIVE
Glucose, UA: >=500 mg/dL — AB
Hgb urine dipstick: NEGATIVE
Ketones, ur: NEGATIVE mg/dL
Leukocytes,Ua: NEGATIVE
Nitrite: NEGATIVE
Protein, ur: NEGATIVE mg/dL
Specific Gravity, Urine: 1.025 (ref 1.005–1.030)
pH: 7 (ref 5.0–8.0)

## 2023-12-31 LAB — I-STAT CHEM 8, ED
BUN: 7 mg/dL (ref 6–20)
Calcium, Ion: 1.05 mmol/L — ABNORMAL LOW (ref 1.15–1.40)
Chloride: 105 mmol/L (ref 98–111)
Creatinine, Ser: 0.7 mg/dL (ref 0.44–1.00)
Glucose, Bld: 252 mg/dL — ABNORMAL HIGH (ref 70–99)
HCT: 42 % (ref 36.0–46.0)
Hemoglobin: 14.3 g/dL (ref 12.0–15.0)
Potassium: 4 mmol/L (ref 3.5–5.1)
Sodium: 139 mmol/L (ref 135–145)
TCO2: 24 mmol/L (ref 22–32)

## 2023-12-31 LAB — HEPATIC FUNCTION PANEL
ALT: 167 U/L — ABNORMAL HIGH (ref 0–44)
AST: 36 U/L (ref 15–41)
Albumin: 3.1 g/dL — ABNORMAL LOW (ref 3.5–5.0)
Alkaline Phosphatase: 201 U/L — ABNORMAL HIGH (ref 38–126)
Bilirubin, Direct: <0.1 mg/dL (ref 0.0–0.2)
Total Bilirubin: 0.3 mg/dL (ref 0.0–1.2)
Total Protein: 5.4 g/dL — ABNORMAL LOW (ref 6.5–8.1)

## 2023-12-31 LAB — COMPREHENSIVE METABOLIC PANEL WITH GFR
ALT: 226 U/L — ABNORMAL HIGH (ref 0–44)
AST: 56 U/L — ABNORMAL HIGH (ref 15–41)
Albumin: 3.6 g/dL (ref 3.5–5.0)
Alkaline Phosphatase: 250 U/L — ABNORMAL HIGH (ref 38–126)
Anion gap: 12 (ref 5–15)
BUN: 8 mg/dL (ref 6–20)
CO2: 22 mmol/L (ref 22–32)
Calcium: 8.9 mg/dL (ref 8.9–10.3)
Chloride: 104 mmol/L (ref 98–111)
Creatinine, Ser: 0.65 mg/dL (ref 0.44–1.00)
GFR, Estimated: >60 mL/min
Glucose, Bld: 259 mg/dL — ABNORMAL HIGH (ref 70–99)
Potassium: 3.9 mmol/L (ref 3.5–5.1)
Sodium: 138 mmol/L (ref 135–145)
Total Bilirubin: 0.3 mg/dL (ref 0.0–1.2)
Total Protein: 6.5 g/dL (ref 6.5–8.1)

## 2023-12-31 LAB — I-STAT CG4 LACTIC ACID, ED: Lactic Acid, Venous: 1.8 mmol/L (ref 0.5–1.9)

## 2023-12-31 LAB — LIPASE, BLOOD: Lipase: 35 U/L (ref 11–51)

## 2023-12-31 MED ORDER — ONDANSETRON 4 MG PO TBDP: 4.0000 mg | ORAL_TABLET | Freq: Three times a day (TID) | ORAL | 0 refills | Status: AC | PRN

## 2023-12-31 MED ORDER — IOHEXOL 300 MG/ML  SOLN
100.0000 mL | Freq: Once | INTRAMUSCULAR | Status: AC | PRN
  Administered 2023-12-31: 100 mL via INTRAVENOUS

## 2023-12-31 MED ORDER — PROCHLORPERAZINE EDISYLATE 10 MG/2ML IJ SOLN
10.0000 mg | Freq: Once | INTRAMUSCULAR | Status: AC
  Administered 2023-12-31: 10 mg via INTRAVENOUS
  Filled 2023-12-31: qty 2

## 2023-12-31 MED ORDER — SODIUM CHLORIDE 0.9 % IV BOLUS
1000.0000 mL | Freq: Once | INTRAVENOUS | Status: AC
  Administered 2023-12-31: 1000 mL via INTRAVENOUS

## 2023-12-31 NOTE — ED Notes (Signed)
 Pt incontinent of stool and urine.  Patient cleansed by primary RN and x2 NT, noted large BM and urine.  Redness noted to sacral region, no open wounds.  Pt repositioned in bed after placing new depends. Pt tolerated well. Removed old dressing to abdominal surgical wound, cleansed with saline and redressed with wet-to-dry dressing secured by primapore.  Pt tolerated well.  Wound picture already taken by Dr. Trine and placed in media tab of medical chart.  No respiratory distress noted.

## 2023-12-31 NOTE — ED Notes (Signed)
 Per Dr. Trine, no repeat lactic needed due to first being under 2.

## 2023-12-31 NOTE — Discharge Instructions (Addendum)
 During the workup we noted incidental findings on your imaging that would require you to follow-up with your regular doctor for further evaluation/management:  Heterogeneous enhancing 5.8 cm left adrenal mass, previously characterized  as a slow-growing adenoma, with dystrophic calcifications and multiple cystic  spaces. Recommend surgical consultation and biochemical evaluation prior to  considering resectio  Please follow-up with your surgeon and your primary doctor.

## 2023-12-31 NOTE — ED Provider Notes (Signed)
 Received signout.  See prior team's note for full HPI.  Clinical Course as of 12/31/23 1159  Tue Dec 31, 2023  9262 Signout; receince bowel obstruction last month complicated by wound dihiscense. Questionable drainage from wound reported by patient. CT scan and US  are re-assuring. Pending repeat LFTs. If downtrending home with surgery f/u.  [TY]  1019 Called lab regarding hepatic function panel and this has been processing for some time.  Apparently the hepatic function panel is hemolyzed; unclear if this has been communicated to nursing staff.  Reached out to nurse to recollect. [TY]  1158 Hepatic function panel(!) Improving. [TY]  1159 Reevaluated is feeling improved.  Vital signs reassuring.  [place] function panel improving.  Imaging reassuring.  Will discharge in stable condition as planned.  Follow-up with surgeon.  Return precautions given.  Discharged in stable condition. [TY]    Clinical Course User Index [TY] Neysa Caron PARAS, DO      Neysa Caron PARAS, DO 12/31/23 1200

## 2023-12-31 NOTE — ED Triage Notes (Signed)
 BIBA from home w/ c/o abdominal pain x few days accompanied by n/v/d.  Per EMS, pt had surgery to repair SBO approx 1 month ago, daughter of patient was changing surgical wound dressing and concerned about infection due to drainage.

## 2023-12-31 NOTE — ED Notes (Signed)
 Patient incontinent of stool and urine.  Patient cleansed and new linen and depends placed.  Pt repositioned in bed by primary rn and charge rn.  Pt tolerated well.  Picture taken of redness (possible stage I to sacrum) and placed in media tab of medical chart.  VS obtained and recorded. No respiratory distress noted. Pt updated on plan of care. Awaiting US  results

## 2023-12-31 NOTE — ED Provider Notes (Signed)
 " [name] Provider Note  CSN: [id number] Arrival date & time: 12/30/23 2356  Chief Complaint(s) Abdominal Pain (BIBA from home w/ c/o abdominal pain x few days accompanied by n/v/d.  Per EMS, pt had surgery to repair SBO approx 1 month ago, daughter of patient was changing surgical wound dressing and concerned about infection due to drainage. )  History provided by patient. HPI & MDM [name] is a 58 y.o. female with a past medical history listed below including small bowel obstruction requiring operative management who had wound dehiscence on December 12 but never followed up with general surgery presents to the emergency department concern for infected wound stating that there is purulence coming out of the wound.  She also reported right sided abdominal pain with nausea and vomiting that began a couple days ago.  Worse with eating.  Abdominal Pain  Differential diagnosis considered.  Workup below.  Wound itself does not appear to be overtly infected.  No appreciable purulence, induration or fluctuance noted. CBC without leukocytosis or anemia CMP without significant electrolyte derangements.  There is hyperglycemia without DKA. Transaminitis/possible bili obstruction noted.  Ordered a right upper quadrant ultrasound to better characterize No pancreatitis. UA without evidence of infection  CT scan obtained notable for nonspecific enteritis.  No bowel obstruction.  No intra-abdominal postoperative complication.  No evidence of wound infection.  She does have cholelithiasis on CT.  Right upper quadrant also confirmed cholelithiasis without acute cholecystitis or dilated common bile duct concerning for choledocholithiasis.  It is possible the patient passed a gallstone causing the abdominal pain nausea and vomiting.  Will repeat LFTs to determine trend.  If downtrending, feel patient is stable for discharge and outpatient management.   If uptrending, she may benefit from admission and MRCP.  Patient care turned over to oncoming provider. Patient case and results discussed in detail; please see their note for further ED managment.    Medical Decision Making Amount and/or Complexity of Data Reviewed Labs: ordered. Decision-making details documented in ED Course. Radiology: ordered and independent interpretation performed. Decision-making details documented in ED Course.  Risk Prescription drug management.    Final Clinical Impression(s) / ED Diagnoses Final diagnoses:  None     Past Medical History Past Medical History:  Diagnosis Date   Acid reflux    Adrenal benign tumor    Apparently enlarging and patient is supposed to have it taken out   Anxiety    Depression    Diabetes mellitus without complication (HCC)    DVT (deep vein thrombosis) in pregnancy    Hypertension    Nausea vomiting and diarrhea 03/24/2021   PE (pulmonary embolism)    Stroke [facility])    Patient Active Problem List   Diagnosis Date Noted   Ileus, postoperative (HCC) 12/13/2023   Diarrhea 12/13/2023   Partial small bowel obstruction (HCC) 12/10/2023   History of DVT (deep vein thrombosis) 12/10/2023   Hypokalemia 12/10/2023   Hypomagnesemia 12/10/2023   Hypocalcemia 12/10/2023   Left adrenal mass 12/10/2023   Tobacco abuse 12/10/2023   NSVT (nonsustained ventricular tachycardia) (HCC) 12/10/2023   Adrenal adenoma, left 12/03/2023   Protein-calorie malnutrition, severe 12/02/2023   Malnutrition of moderate degree 11/30/2023   Shock (HCC) 11/29/2023   Bowel obstruction (HCC) 11/28/2023   Sepsis (HCC) 03/20/2022   Pressure injury of skin 03/28/2021   SIRS (systemic inflammatory response syndrome) (HCC) 03/24/2021   Type 2 diabetes mellitus with hyperglycemia (HCC) 03/24/2021  Nausea vomiting and diarrhea 03/24/2021   Recurrent pulmonary embolism (HCC) 03/24/2021   Chest pain 03/24/2021   CKD (chronic kidney disease), stage  III (HCC) 05/17/2016   Type 2 diabetes mellitus with vascular disease (HCC) 05/17/2016   Essential hypertension 05/17/2016   DVT (deep venous thrombosis) (HCC) 05/16/2016   History of CVA (cerebrovascular accident) 07/30/2013   TIA (transient ischemic attack) 07/30/2013   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 Authorizing Provider  acetaminophen  (TYLENOL ) 500 MG tablet Take 2 tablets (1,000 mg total) by mouth every 6 (six) hours as needed for mild pain (pain score 1-3). 12/03/23  Yes Al-Sultani, Anmar, MD  apixaban  (ELIQUIS ) 5 MG TABS tablet Take 2 tablets (10 mg total) by mouth 2 (two) times daily for 7 days, THEN 1 tablet (5 mg total) 2 (two) times daily. 12/17/23 03/16/24  Briana Elgin LABOR, MD  metFORMIN  (GLUCOPHAGE ) 500 MG tablet Take 1 tablet (500 mg total) by mouth 2 (two) times daily with a meal. 12/03/23   Al-Sultani, Anmar, MD  methocarbamol  (ROBAXIN ) 750 MG tablet Take 750 mg by mouth 4 (four) times daily.    [provider]  Multiple Vitamin (MULTIVITAMIN WITH MINERALS) TABS tablet Take 1 tablet by mouth daily. 12/03/23   Al-Sultani, Anmar, MD  ondansetron  (ZOFRAN -ODT) 4 MG disintegrating tablet Take 1 tablet (4 mg total) by mouth every 8 (eight) hours as needed for nausea or vomiting. 12/06/23   Tegeler, Lonni PARAS, MD  oxycodone  (OXY-IR) 5 MG capsule Take 5 mg by mouth every 6 (six) hours as needed for pain.    [provider]  pantoprazole  (PROTONIX ) 40 MG tablet Take 1 tablet (40 mg total) by mouth at bedtime. 12/03/23 01/02/24  Al-Sultani, Anmar, MD  polyethylene glycol (MIRALAX  / GLYCOLAX ) 17 g packet Take 17 g by mouth daily as needed for mild constipation. 12/03/23   Al-Sultani, Anmar, MD                                                                                                                                     Allergies Amoxicillin, Aspirin, Codeine, and Penicillins  Review of Systems Review of Systems  Gastrointestinal:  Positive for abdominal pain.   As noted in HPI  Physical Exam Vital Signs  I have reviewed the triage vital signs BP 138/77 (BP Location: Right Arm)   Pulse 95   Temp 97.9 F (36.6 C) (Oral)   Resp 16   Ht 5' 6 (1.676 m)   Wt 65 kg   LMP  (LMP Unknown) Comment: a few months  SpO2 98%   BMI 23.13 kg/m   Physical Exam Vitals reviewed.  Constitutional:      General: She is not in acute distress.    Appearance: She is well-developed. She is not diaphoretic.  HENT:     Head:  Normocephalic and atraumatic.     Right Ear: External ear normal.     Left Ear: External ear normal.     Nose: Nose normal.  Eyes:     General: No scleral icterus.    Conjunctiva/sclera: Conjunctivae normal.  Neck:     Trachea: Phonation normal.  Cardiovascular:     Rate and Rhythm: Normal rate and regular rhythm.  Pulmonary:     Effort: Pulmonary effort is normal. No respiratory distress.     Breath sounds: No stridor.  Abdominal:     General: There is no distension.     Tenderness: There is abdominal tenderness in the right upper quadrant and left lower quadrant. There is no guarding or rebound.   Musculoskeletal:        General: Normal range of motion.     Cervical back: Normal range of motion.  Skin:    Comments: R>L sacral decubitus skin changes  Neurological:     Mental Status: She is alert and oriented to person, place, and time.  Psychiatric:        Behavior: Behavior normal.        ED Results and Treatments Labs (all labs ordered are listed, but only abnormal results are displayed) Labs Reviewed  COMPREHENSIVE METABOLIC PANEL WITH GFR - Abnormal; Notable for the following components:      Result Value   Glucose, Bld 259 (*)    AST 56 (*)    ALT 226 (*)    Alkaline Phosphatase 250 (*)    All other components within normal limits  URINALYSIS, ROUTINE W  REFLEX MICROSCOPIC - Abnormal; Notable for the following components:   Color, Urine STRAW (*)    Glucose, UA >=500 (*)    All other components within normal limits  I-STAT CHEM 8, ED - Abnormal; Notable for the following components:   Glucose, Bld 252 (*)    Calcium , Ion 1.05 (*)    All other components within normal limits  LIPASE, BLOOD  CBC WITH DIFFERENTIAL/PLATELET  HEPATIC FUNCTION PANEL  I-STAT CG4 LACTIC ACID, ED                                                                                                                         EKG  EKG Interpretation Date/Time:    Ventricular Rate:    PR Interval:    QRS Duration:    QT Interval:    QTC Calculation:   R Axis:      Text Interpretation:         Radiology US  Abdomen Limited RUQ (LIVER/GB) Result Date: 12/31/2023 CLINICAL DATA:  Transaminitis. EXAM: ULTRASOUND ABDOMEN LIMITED RIGHT UPPER QUADRANT COMPARISON:  CT scan from earlier same day FINDINGS: Gallbladder: Layering tiny gallstones evident, measuring in the 1-2 mm size range. No gallbladder wall thickening or pericholecystic fluid. No sonographic Murphy sign. Common bile duct: Diameter: 8 mm Liver: Mildly increased echogenicity. No focal abnormality. Portal vein is patent on color Doppler imaging with normal  direction of blood flow towards the liver. Other: None. IMPRESSION: 1. Cholelithiasis without gallbladder wall thickening or pericholecystic fluid. Electronically Signed   By: Camellia Candle M.D.   On: 12/31/2023 05:11   CT ABDOMEN PELVIS W CONTRAST Result Date: 12/31/2023 EXAM: CT ABDOMEN AND PELVIS WITH CONTRAST 12/31/2023 02:09:56 AM TECHNIQUE: CT of the abdomen and pelvis was performed with the administration of 100 mL of iohexol  (OMNIPAQUE ) 300 MG/ML solution. Multiplanar reformatted images are provided for review. Automated exposure control, iterative reconstruction, and/or weight-based adjustment of the mA/kV was utilized to reduce the radiation dose to as low  as reasonably achievable. COMPARISON: Numerous previous CTs back to 2007. The two most recent levels are both with contrast, dated 12/20/2023 and 12/15/2023. CLINICAL HISTORY: Abdominal pain, post-op. Patient underwent laparotomy with a small bowel resection 11/28/2023 for small bowel obstruction. FINDINGS: LOWER CHEST: Minimal left pleural effusion with improvement. Lung bases are otherwise clear. The cardiac size is normal. LIVER: The liver is mildly steatotic without mass. GALLBLADDER AND BILE DUCTS: There are tiny layering stones in the gallbladder, but no wall thickening or biliary dilatation. SPLEEN: No acute abnormality. PANCREAS: No acute abnormality. ADRENAL GLANDS: There is no right adrenal mass. A heterogeneous enhancing 5.8 cm left adrenal mass has been previously characterized as a slow-growing adenoma, measuring 4.3 cm in 2014. There are dystrophic calcifications and multiple cystic spaces within the mass. KIDNEYS, URETERS AND BLADDER: No stones in the kidneys or ureters. No hydronephrosis. No perinephric or periureteral stranding. There is no renal mass. There are occasional bilateral Bosniak 2 cortical cysts, which are too small to characterize. Per consensus, no follow-up is needed for simple Bosniak type 1 and 2 renal cysts, unless the patient has a malignancy history or risk factors. There is bilateral cortical thinning and scattered cortical scarring, left greater than right. Urinary bladder is unremarkable. GI AND BOWEL: There is partial fluid filling of the stomach with normal wall thickness. There are thickened folds in the duodenum and jejunum consistent with nonspecific enteritis. No mesenteric inflammatory changes or pneumatosis are seen, and no bowel obstruction. Small bowel surgical anastomosis in the right lower quadrant is again shown. There is fluid in the colon with normal bowel thickness. PERITONEUM AND RETROPERITONEUM: Mild edema in the body wall persists, but prior mesenteric  congestive changes and ascites have cleared. No free air. VASCULATURE: Aorta is normal in caliber. There is heavy for age aortic and branch vessel atherosclerosis. LYMPH NODES: No lymphadenopathy. REPRODUCTIVE ORGANS: No acute abnormality. BONES AND SOFT TISSUES: There are numerous calcified injection granulomas in the buttocks. Asymmetric soft tissue thickening noted underlying the right ischial tuberosity concerning for decubitus changes. Correlate clinically for infectious complications such as cellulitis. No acute or significant osseous findings. The surface laparotomy wound over the midline abdominal wall is less widely open than previously. IMPRESSION: 1. Nonspecific enteritis involving the duodenum and jejunum. No bowel obstruction. No mesenteric inflammatory change. 2. Asymmetric soft tissue thickening underlying the right ischial tuberosity, concerning for decubitus change. Recommend evaluation for infectious complication such as cellulitis. 3. Heterogeneous enhancing 5.8 cm left adrenal mass, previously characterized as a slow-growing adenoma, with dystrophic calcifications and multiple cystic spaces. Recommend surgical consultation and biochemical evaluation prior to considering resection. Electronically signed by: Francis Quam MD 12/31/2023 02:49 AM EST RP Workstation: [id number]    Medications Ordered in ED Medications  prochlorperazine  (COMPAZINE ) injection 10 mg (10 mg Intravenous Given 12/31/23 0110)  sodium chloride  0.9 % bolus 1,000 mL (0 mLs Intravenous Stopped 12/31/23 0315)  iohexol  (OMNIPAQUE ) 300 MG/ML solution 100 mL (100 mLs Intravenous Contrast Given 12/31/23 0150)   Procedures Procedures  (including critical care time)   This chart was dictated using voice recognition software.  Despite best efforts to proofread,  errors can occur which can change the documentation meaning.   Trine Raynell Moder, MD 12/31/23 [phone]  "

## 2024-02-07 ENCOUNTER — Telehealth: Payer: Self-pay

## 2024-02-07 NOTE — Telephone Encounter (Addendum)
 We are not accepting new pt at [facility].. pt has not been seen since 2023 with [facility]

## 2024-02-10 ENCOUNTER — Ambulatory Visit: Payer: Self-pay | Admitting: Nurse Practitioner
